# Patient Record
Sex: Male | Born: 1960
Health system: Southern US, Community
[De-identification: ages and names within clinical notes are randomized; demographics above are authoritative.]

## PROBLEM LIST (undated history)

## (undated) DIAGNOSIS — M1990 Unspecified osteoarthritis, unspecified site: Secondary | ICD-10-CM

## (undated) DIAGNOSIS — N189 Chronic kidney disease, unspecified: Secondary | ICD-10-CM

## (undated) DIAGNOSIS — K5792 Diverticulitis of intestine, part unspecified, without perforation or abscess without bleeding: Secondary | ICD-10-CM

## (undated) DIAGNOSIS — E785 Hyperlipidemia, unspecified: Secondary | ICD-10-CM

## (undated) DIAGNOSIS — I10 Essential (primary) hypertension: Secondary | ICD-10-CM

## (undated) DIAGNOSIS — K635 Polyp of colon: Secondary | ICD-10-CM

## (undated) DIAGNOSIS — K219 Gastro-esophageal reflux disease without esophagitis: Secondary | ICD-10-CM

## (undated) DIAGNOSIS — R7303 Prediabetes: Secondary | ICD-10-CM

## (undated) DIAGNOSIS — F419 Anxiety disorder, unspecified: Secondary | ICD-10-CM

## (undated) HISTORY — PX: COLONOSCOPY: SHX174

## (undated) HISTORY — DX: Hyperlipidemia, unspecified: E78.5

## (undated) HISTORY — PX: SHOULDER ARTHROSCOPY WITH ROTATOR CUFF REPAIR: SHX5685

## (undated) HISTORY — PX: COLONOSCOPY W/ BIOPSIES AND POLYPECTOMY: SHX1376

## (undated) HISTORY — DX: Polyp of colon: K63.5

## (undated) HISTORY — DX: Diverticulitis of intestine, part unspecified, without perforation or abscess without bleeding: K57.92

## (undated) HISTORY — PX: HEMORRHOID SURGERY: SHX153

---

## 2002-07-24 ENCOUNTER — Encounter: Payer: Self-pay | Admitting: General Surgery

## 2002-07-24 ENCOUNTER — Inpatient Hospital Stay (HOSPITAL_COMMUNITY): Admission: EM | Admit: 2002-07-24 | Discharge: 2002-07-29 | Payer: Self-pay | Admitting: Emergency Medicine

## 2002-08-11 ENCOUNTER — Emergency Department (HOSPITAL_COMMUNITY): Admission: EM | Admit: 2002-08-11 | Discharge: 2002-08-11 | Payer: Self-pay | Admitting: Emergency Medicine

## 2002-08-11 ENCOUNTER — Encounter: Payer: Self-pay | Admitting: General Surgery

## 2002-08-29 ENCOUNTER — Encounter: Payer: Self-pay | Admitting: General Surgery

## 2002-08-29 ENCOUNTER — Encounter: Admission: RE | Admit: 2002-08-29 | Discharge: 2002-08-29 | Payer: Self-pay | Admitting: General Surgery

## 2002-11-09 HISTORY — PX: CYST EXCISION: SHX5701

## 2006-03-09 ENCOUNTER — Ambulatory Visit: Payer: Self-pay | Admitting: Gastroenterology

## 2006-03-23 ENCOUNTER — Ambulatory Visit: Payer: Self-pay | Admitting: Gastroenterology

## 2011-01-28 ENCOUNTER — Other Ambulatory Visit: Payer: Self-pay | Admitting: Family Medicine

## 2011-01-28 DIAGNOSIS — R17 Unspecified jaundice: Secondary | ICD-10-CM

## 2011-01-28 DIAGNOSIS — M8560 Other cyst of bone, unspecified site: Secondary | ICD-10-CM

## 2011-02-02 ENCOUNTER — Ambulatory Visit (HOSPITAL_COMMUNITY)
Admission: RE | Admit: 2011-02-02 | Discharge: 2011-02-02 | Disposition: A | Discharge status: Home / Self Care | Payer: BC Managed Care – PPO | Source: Ambulatory Visit | Attending: Family Medicine | Admitting: Family Medicine

## 2011-02-02 ENCOUNTER — Other Ambulatory Visit (HOSPITAL_COMMUNITY): Payer: Self-pay

## 2011-02-02 DIAGNOSIS — K7689 Other specified diseases of liver: Secondary | ICD-10-CM | POA: Insufficient documentation

## 2011-02-02 DIAGNOSIS — R160 Hepatomegaly, not elsewhere classified: Secondary | ICD-10-CM | POA: Insufficient documentation

## 2011-02-02 DIAGNOSIS — M67919 Unspecified disorder of synovium and tendon, unspecified shoulder: Secondary | ICD-10-CM | POA: Insufficient documentation

## 2011-02-02 DIAGNOSIS — M8560 Other cyst of bone, unspecified site: Secondary | ICD-10-CM

## 2011-02-02 DIAGNOSIS — M719 Bursopathy, unspecified: Secondary | ICD-10-CM | POA: Insufficient documentation

## 2011-02-02 DIAGNOSIS — R17 Unspecified jaundice: Secondary | ICD-10-CM

## 2011-02-02 DIAGNOSIS — M25519 Pain in unspecified shoulder: Secondary | ICD-10-CM | POA: Insufficient documentation

## 2011-05-25 ENCOUNTER — Encounter: Payer: Self-pay | Admitting: Gastroenterology

## 2012-07-28 ENCOUNTER — Encounter: Payer: Self-pay | Admitting: Gastroenterology

## 2012-10-18 ENCOUNTER — Encounter: Payer: Self-pay | Admitting: Internal Medicine

## 2012-11-22 ENCOUNTER — Ambulatory Visit (AMBULATORY_SURGERY_CENTER): Payer: BC Managed Care – PPO

## 2012-11-22 VITALS — Ht 70.0 in | Wt 268.8 lb

## 2012-11-22 DIAGNOSIS — Z800 Family history of malignant neoplasm of digestive organs: Secondary | ICD-10-CM

## 2012-11-22 DIAGNOSIS — Z1211 Encounter for screening for malignant neoplasm of colon: Secondary | ICD-10-CM

## 2012-11-22 MED ORDER — MOVIPREP 100 G PO SOLR: ORAL | Status: DC

## 2012-11-23 ENCOUNTER — Encounter: Payer: Self-pay | Admitting: Internal Medicine

## 2012-11-24 ENCOUNTER — Encounter: Payer: Self-pay | Admitting: Internal Medicine

## 2012-12-02 ENCOUNTER — Encounter: Payer: Self-pay | Admitting: Internal Medicine

## 2013-01-05 ENCOUNTER — Telehealth: Payer: Self-pay | Admitting: Internal Medicine

## 2013-01-05 NOTE — Telephone Encounter (Signed)
Called patient and he stated the prep wasn't available to be picked up.  He was not aware that he needed to pick up the prep within 7 days.  I advised him that the prescription should still be in the computer, that they just place the medication back after 7 days.  They will need to fill it again.  Advised him to call if there is any problems.  He agreed to do so.

## 2013-01-06 ENCOUNTER — Ambulatory Visit (AMBULATORY_SURGERY_CENTER): Payer: BC Managed Care – PPO | Admitting: Internal Medicine

## 2013-01-06 ENCOUNTER — Encounter: Payer: Self-pay | Admitting: Internal Medicine

## 2013-01-06 VITALS — BP 144/83 | HR 64 | Temp 98.2°F | Resp 14 | Ht 70.0 in | Wt 268.0 lb

## 2013-01-06 DIAGNOSIS — Z1211 Encounter for screening for malignant neoplasm of colon: Secondary | ICD-10-CM

## 2013-01-06 DIAGNOSIS — D126 Benign neoplasm of colon, unspecified: Secondary | ICD-10-CM

## 2013-01-06 DIAGNOSIS — Z800 Family history of malignant neoplasm of digestive organs: Secondary | ICD-10-CM

## 2013-01-06 MED ORDER — SODIUM CHLORIDE 0.9 % IV SOLN: 500.0000 mL | INTRAVENOUS | Status: DC

## 2013-01-06 NOTE — Progress Notes (Signed)
Patient did not have preoperative order for IV antibiotic SSI prophylaxis. (G8918)   

## 2013-01-06 NOTE — Progress Notes (Signed)

## 2013-01-06 NOTE — Op Note (Signed)
[facility] [address] [place], [address]   COLONOSCOPY PROCEDURE REPORT  PATIENT: Francisco Allen, Francisco Allen  MR#: [id number] BIRTHDATE: 04-26-1961 , 51  yrs. old GENDER: Male ENDOSCOPIST: Roxy Cedar, MD REFERRED NU:[id number] Program Recall PROCEDURE DATE:  01/06/2013 PROCEDURE:   Colonoscopy with snare polypectomy    x 1 ASA CLASS:   Class II INDICATIONS:Patient's immediate family history of colon cancer. Index exam 03-2006 with hyperplastic polyp. MEDICATIONS: MAC sedation, administered by CRNA and propofol (Diprivan) 300mg  IV  DESCRIPTION OF PROCEDURE:   After the risks benefits and alternatives of the procedure were thoroughly explained, informed consent was obtained.  A digital rectal exam revealed no abnormalities of the rectum.   The LB [id number]  endoscope was introduced through the anus and advanced to the cecum, which was identified by both the appendix and ileocecal valve. No adverse events experienced.   The quality of the prep was good, using MoviPrep  The instrument was then slowly withdrawn as the colon was fully examined.      COLON FINDINGS: A diminutive polyp was found in the transverse colon.  A polypectomy was performed with a cold snare.  The resection was complete and the polyp tissue was completely retrieved.   Moderate diverticulosis was noted The finding was in the left colon.   The colon mucosa was otherwise normal. Retroflexed views revealed internal hemorrhoids. The time to cecum=3 minutes 07 seconds.  Withdrawal time=10 minutes 26 seconds. The scope was withdrawn and the procedure completed. COMPLICATIONS: There were no complications.  ENDOSCOPIC IMPRESSION: 1.   Diminutive polyp was found in the transverse colon; polypectomy was performed with a cold snare 2.   Moderate diverticulosis was noted in the left colon 3.   The colon mucosa was otherwise normal  RECOMMENDATIONS: 1. Follow up colonoscopy in 5  years   eSigned:  Roxy Cedar, MD 01/06/2013 11:45 AM   cc: Rudi Heap, MD and The Patient   PATIENT NAME:  Francisco Allen, Francisco Allen MR#: [id number]

## 2013-01-06 NOTE — Progress Notes (Signed)
Called to room to assist during endoscopic procedure.  Patient ID and intended procedure confirmed with present staff. Received instructions for my participation in the procedure from the performing physician. ewm 

## 2013-01-06 NOTE — Patient Instructions (Addendum)
YOU HAD AN ENDOSCOPIC PROCEDURE TODAY AT THE [facility]: Refer to the procedure report that was given to you for any specific questions about what was found during the examination.  If the procedure report does not answer your questions, please call your gastroenterologist to clarify.  If you requested that your care partner not be given the details of your procedure findings, then the procedure report has been included in a sealed envelope for you to review at your convenience later.  YOU SHOULD EXPECT: Some feelings of bloating in the abdomen. Passage of more gas than usual.  Walking can help get rid of the air that was put into your GI tract during the procedure and reduce the bloating. If you had a lower endoscopy (such as a colonoscopy or flexible sigmoidoscopy) you may notice spotting of blood in your stool or on the toilet paper. If you underwent a bowel prep for your procedure, then you may not have a normal bowel movement for a few days.  DIET: Your first meal following the procedure should be a light meal and then it is ok to progress to your normal diet.  A half-sandwich or bowl of soup is an example of a good first meal.  Heavy or fried foods are harder to digest and may make you feel nauseous or bloated.  Likewise meals heavy in dairy and vegetables can cause extra gas to form and this can also increase the bloating.  Drink plenty of fluids but you should avoid alcoholic beverages for 24 hours.  Try to increase your fiber intake to prevent Diverticulitis.  ACTIVITY: Your care partner should take you home directly after the procedure.  You should plan to take it easy, moving slowly for the rest of the day.  You can resume normal activity the day after the procedure however you should NOT DRIVE or use heavy machinery for 24 hours (because of the sedation medicines used during the test).    SYMPTOMS TO REPORT IMMEDIATELY: A gastroenterologist can be reached at any hour.  During  normal business hours, 8:30 AM to 5:00 PM Monday through Friday, call [phone].  After hours and on weekends, please call the GI answering service at [phone] who will take a message and have the physician on call contact you.   Following lower endoscopy (colonoscopy or flexible sigmoidoscopy):  Excessive amounts of blood in the stool  Significant tenderness or worsening of abdominal pains  Swelling of the abdomen that is new, acute  Fever of 100F or higher  FOLLOW UP: If any biopsies were taken you will be contacted by phone or by letter within the next 1-3 weeks.  Call your gastroenterologist if you have not heard about the biopsies in 3 weeks.  Our staff will call the home number listed on your records the next business day following your procedure to check on you and address any questions or concerns that you may have at that time regarding the information given to you following your procedure. This is a courtesy call and so if there is no answer at the home number and we have not heard from you through the emergency physician on call, we will assume that you have returned to your regular daily activities without incident.  SIGNATURES/CONFIDENTIALITY: You and/or your care partner have signed paperwork which will be entered into your electronic medical record.  These signatures attest to the fact that that the information above on your After Visit Summary has been reviewed and  is understood.  Full responsibility of the confidentiality of this discharge information lies with you and/or your care-partner.

## 2013-01-09 ENCOUNTER — Telehealth: Payer: Self-pay

## 2013-01-09 NOTE — Telephone Encounter (Signed)
The pt's voice mail box has not been set up on the recording at [phone] no message left. Maw

## 2013-01-10 ENCOUNTER — Encounter: Payer: Self-pay | Admitting: Internal Medicine

## 2013-03-09 ENCOUNTER — Other Ambulatory Visit: Payer: Self-pay | Admitting: *Deleted

## 2013-03-09 MED ORDER — ALPRAZOLAM 1 MG PO TABS: ORAL_TABLET | ORAL | Status: DC

## 2013-03-09 NOTE — Telephone Encounter (Signed)
LAST REFILL 11/14/12. CALL IN [facility] MAYODAN. Marland Kitchen LAST OV 11/13 WITH ACM

## 2013-03-16 NOTE — Telephone Encounter (Signed)
Phoned in and left on [facility] voicemail.

## 2013-04-04 ENCOUNTER — Ambulatory Visit (INDEPENDENT_AMBULATORY_CARE_PROVIDER_SITE_OTHER): Payer: BC Managed Care – PPO | Admitting: Nurse Practitioner

## 2013-04-04 ENCOUNTER — Encounter: Payer: Self-pay | Admitting: Nurse Practitioner

## 2013-04-04 VITALS — BP 143/95 | HR 76 | Temp 98.1°F | Ht 70.0 in | Wt 265.0 lb

## 2013-04-04 DIAGNOSIS — Z125 Encounter for screening for malignant neoplasm of prostate: Secondary | ICD-10-CM

## 2013-04-04 DIAGNOSIS — E1169 Type 2 diabetes mellitus with other specified complication: Secondary | ICD-10-CM | POA: Insufficient documentation

## 2013-04-04 DIAGNOSIS — E785 Hyperlipidemia, unspecified: Secondary | ICD-10-CM

## 2013-04-04 DIAGNOSIS — F411 Generalized anxiety disorder: Secondary | ICD-10-CM

## 2013-04-04 LAB — COMPLETE METABOLIC PANEL WITH GFR
AST: 30 U/L (ref 0–37)
Albumin: 4.4 g/dL (ref 3.5–5.2)
Alkaline Phosphatase: 70 U/L (ref 39–117)
BUN: 18 mg/dL (ref 6–23)
Potassium: 4.3 mEq/L (ref 3.5–5.3)
Total Bilirubin: 1.1 mg/dL (ref 0.3–1.2)

## 2013-04-04 MED ORDER — ATORVASTATIN CALCIUM 40 MG PO TABS: 40.0000 mg | ORAL_TABLET | Freq: Every day | ORAL | Status: DC

## 2013-04-04 MED ORDER — OMEGA-3-ACID ETHYL ESTERS 1 G PO CAPS: 1.0000 g | ORAL_CAPSULE | Freq: Two times a day (BID) | ORAL | Status: DC

## 2013-04-04 NOTE — Progress Notes (Signed)
  Subjective:    Patient ID: Francisco Allen, male    DOB: 1961-08-06, 52 y.o.   MRN: [id number]  Hyperlipidemia This is a chronic problem. The current episode started more than 1 year ago. The problem is controlled. Recent lipid tests were reviewed and are normal. Exacerbating diseases include obesity. There are no known factors aggravating his hyperlipidemia. Pertinent negatives include no chest pain, focal sensory loss, leg pain, myalgias or shortness of breath. Current antihyperlipidemic treatment includes statins. The current treatment provides moderate improvement of lipids. Compliance problems include adherence to diet and adherence to exercise.  Risk factors for coronary artery disease include male sex and obesity.  GAD Takes xanax prn- on average 1 a day.    Review of Systems  Constitutional: Negative.   HENT: Negative.   Respiratory: Negative.  Negative for shortness of breath.   Cardiovascular: Negative for chest pain, palpitations and leg swelling.  Endocrine: Negative.   Genitourinary: Negative.   Musculoskeletal: Negative for myalgias.  Allergic/Immunologic: Negative.   Neurological: Negative.   Hematological: Negative.   Psychiatric/Behavioral: Negative.        Objective:   Physical Exam  Constitutional: He is oriented to person, place, and time. He appears well-developed and well-nourished.  HENT:  Head: Normocephalic.  Right Ear: External ear normal.  Left Ear: External ear normal.  Nose: Nose normal.  Mouth/Throat: Oropharynx is clear and moist.  Eyes: EOM are normal. Pupils are equal, round, and reactive to light.  Neck: Normal range of motion. Neck supple. No thyromegaly present.  Cardiovascular: Normal rate, regular rhythm, normal heart sounds and intact distal pulses.   No murmur heard. Pulmonary/Chest: Effort normal and breath sounds normal. He has no wheezes. He has no rales.  Abdominal: Soft. Bowel sounds are normal.  Musculoskeletal: Normal range of  motion.  Neurological: He is alert and oriented to person, place, and time.  Skin: Skin is warm and dry.  Psychiatric: He has a normal mood and affect. His behavior is normal. Judgment and thought content normal.    BP 143/95  Pulse 76  Temp(Src) 98.1 F (36.7 C) (Oral)  Ht 5\' 10"  (1.778 m)  Wt 265 lb (120.203 kg)  BMI 38.02 kg/m2       Assessment & Plan:  1. GAD (generalized anxiety disorder) Stress management  2. Hyperlipidemia Low fat diet and exercise - COMPLETE METABOLIC PANEL WITH GFR - NMR Lipoprofile with Lipids  3. Screening for prostate cancer - PSA  Continue all meds  Mary-Margaret Daphine Deutscher, FNP   Mary-Margaret Daphine Deutscher, FNP

## 2013-04-04 NOTE — Patient Instructions (Addendum)
Health Maintenance, Males A healthy lifestyle and preventative care can promote health and wellness.  Maintain regular health, dental, and eye exams.  Eat a healthy diet. Foods like vegetables, fruits, whole grains, low-fat dairy products, and lean protein foods contain the nutrients you need without too many calories. Decrease your intake of foods high in solid fats, added sugars, and salt. Get information about a proper diet from your caregiver, if necessary.  Regular physical exercise is one of the most important things you can do for your health. Most adults should get at least 150 minutes of moderate-intensity exercise (any activity that increases your heart rate and causes you to sweat) each week. In addition, most adults need muscle-strengthening exercises on 2 or more days a week.   Maintain a healthy weight. The body mass index (BMI) is a screening tool to identify possible weight problems. It provides an estimate of body fat based on height and weight. Your caregiver can help determine your BMI, and can help you achieve or maintain a healthy weight. For adults 20 years and older:  A BMI below 18.5 is considered underweight.  A BMI of 18.5 to 24.9 is normal.  A BMI of 25 to 29.9 is considered overweight.  A BMI of 30 and above is considered obese.  Maintain normal blood lipids and cholesterol by exercising and minimizing your intake of saturated fat. Eat a balanced diet with plenty of fruits and vegetables. Blood tests for lipids and cholesterol should begin at age 20 and be repeated every 5 years. If your lipid or cholesterol levels are high, you are over 50, or you are a high risk for heart disease, you may need your cholesterol levels checked more frequently.Ongoing high lipid and cholesterol levels should be treated with medicines, if diet and exercise are not effective.  If you smoke, find out from your caregiver how to quit. If you do not use tobacco, do not start.  If you  choose to drink alcohol, do not exceed 2 drinks per day. One drink is considered to be 12 ounces (355 mL) of beer, 5 ounces (148 mL) of wine, or 1.5 ounces (44 mL) of liquor.  Avoid use of street drugs. Do not share needles with anyone. Ask for help if you need support or instructions about stopping the use of drugs.  High blood pressure causes heart disease and increases the risk of stroke. Blood pressure should be checked at least every 1 to 2 years. Ongoing high blood pressure should be treated with medicines if weight loss and exercise are not effective.  If you are 45 to 54 years old, ask your caregiver if you should take aspirin to prevent heart disease.  Diabetes screening involves taking a blood sample to check your fasting blood sugar level. This should be done once every 3 years, after age 45, if you are within normal weight and without risk factors for diabetes. Testing should be considered at a younger age or be carried out more frequently if you are overweight and have at least 1 risk factor for diabetes.  Colorectal cancer can be detected and often prevented. Most routine colorectal cancer screening begins at the age of 50 and continues through age 75. However, your caregiver may recommend screening at an earlier age if you have risk factors for colon cancer. On a yearly basis, your caregiver may provide home test kits to check for hidden blood in the stool. Use of a small camera at the end of a tube,   to directly examine the colon (sigmoidoscopy or colonoscopy), can detect the earliest forms of colorectal cancer. Talk to your caregiver about this at age 50, when routine screening begins. Direct examination of the colon should be repeated every 5 to 10 years through age 75, unless early forms of pre-cancerous polyps or small growths are found.  Hepatitis C blood testing is recommended for all people born from 1945 through 1965 and any individual with known risks for hepatitis C.  Healthy  men should no longer receive prostate-specific antigen (PSA) blood tests as part of routine cancer screening. Consult with your caregiver about prostate cancer screening.  Testicular cancer screening is not recommended for adolescents or adult males who have no symptoms. Screening includes self-exam, caregiver exam, and other screening tests. Consult with your caregiver about any symptoms you have or any concerns you have about testicular cancer.  Practice safe sex. Use condoms and avoid high-risk sexual practices to reduce the spread of sexually transmitted infections (STIs).  Use sunscreen with a sun protection factor (SPF) of 30 or greater. Apply sunscreen liberally and repeatedly throughout the day. You should seek shade when your shadow is shorter than you. Protect yourself by wearing long sleeves, pants, a wide-brimmed hat, and sunglasses year round, whenever you are outdoors.  Notify your caregiver of new moles or changes in moles, especially if there is a change in shape or color. Also notify your caregiver if a mole is larger than the size of a pencil eraser.  A one-time screening for abdominal aortic aneurysm (AAA) and surgical repair of large AAAs by sound wave imaging (ultrasonography) is recommended for ages 65 to 75 years who are current or former smokers.  Stay current with your immunizations. Document Released: 04/23/2008 Document Revised: 01/18/2012 Document Reviewed: 03/23/2011 ExitCare Patient Information 2014 [employer].  

## 2013-04-05 LAB — PSA: PSA: 0.59 ng/mL (ref <=4.00)

## 2013-04-06 LAB — NMR LIPOPROFILE WITH LIPIDS
Cholesterol, Total: 244 mg/dL — ABNORMAL HIGH (ref <200)
HDL-C: 29 mg/dL — ABNORMAL LOW (ref >=40)
LDL (calc): 141 mg/dL — ABNORMAL HIGH (ref <100)
LDL Particle Number: 3298 nmol/L — ABNORMAL HIGH (ref <1000)
LP-IR Score: 87 — ABNORMAL HIGH (ref <=45)
Large VLDL-P: 17 nmol/L — ABNORMAL HIGH (ref <=2.7)
Triglycerides: 370 mg/dL — ABNORMAL HIGH (ref <150)
VLDL Size: 55.1 nm — ABNORMAL HIGH (ref <=46.6)

## 2013-04-19 ENCOUNTER — Other Ambulatory Visit: Payer: Self-pay | Admitting: *Deleted

## 2013-04-19 MED ORDER — TRAMADOL HCL 50 MG PO TABS: ORAL_TABLET | ORAL | Status: DC

## 2013-04-19 NOTE — Telephone Encounter (Signed)
LAT RF 10/03/12. LAST OV 04/04/13. PLEASE PRINT RX AND CALL PT WHEN READY IF APPROVED.

## 2013-04-26 ENCOUNTER — Other Ambulatory Visit: Payer: Self-pay | Admitting: Family Medicine

## 2013-04-28 NOTE — Telephone Encounter (Signed)
Please call in xanax rx with 1 refill 

## 2013-04-28 NOTE — Telephone Encounter (Signed)
Last seen 04/04/13, last filled 03/09/13, Have nurse call into [facility]

## 2013-06-23 ENCOUNTER — Other Ambulatory Visit: Payer: Self-pay

## 2013-06-23 MED ORDER — TRAMADOL HCL 50 MG PO TABS: ORAL_TABLET | ORAL | Status: DC

## 2013-06-23 NOTE — Telephone Encounter (Signed)
Up front 

## 2013-06-23 NOTE — Telephone Encounter (Signed)
rx ready for pickup 

## 2013-06-23 NOTE — Telephone Encounter (Signed)
Last seen 04/04/13   Last filled 04/19/13    If approved print and route to nurse

## 2013-07-04 ENCOUNTER — Telehealth: Payer: Self-pay | Admitting: Nurse Practitioner

## 2013-07-04 ENCOUNTER — Other Ambulatory Visit: Payer: Self-pay | Admitting: Nurse Practitioner

## 2013-07-04 DIAGNOSIS — E785 Hyperlipidemia, unspecified: Secondary | ICD-10-CM

## 2013-07-04 NOTE — Telephone Encounter (Signed)
Orders placed.

## 2013-07-05 NOTE — Telephone Encounter (Signed)
Pt aware - med at pharm  

## 2013-07-05 NOTE — Telephone Encounter (Signed)
Last seen 03/29/13  MMM  Last filled 04/26/13    If approved route to nurse to call in

## 2013-07-05 NOTE — Telephone Encounter (Signed)
Please call in xanax rx with 1 refill 

## 2013-07-07 ENCOUNTER — Other Ambulatory Visit (INDEPENDENT_AMBULATORY_CARE_PROVIDER_SITE_OTHER): Payer: BC Managed Care – PPO

## 2013-07-07 DIAGNOSIS — E785 Hyperlipidemia, unspecified: Secondary | ICD-10-CM

## 2013-07-09 LAB — CMP14+EGFR
ALT: 53 IU/L — ABNORMAL HIGH (ref 0–44)
Albumin: 4.6 g/dL (ref 3.5–5.5)
BUN: 13 mg/dL (ref 6–24)
CO2: 24 mmol/L (ref 18–29)
Calcium: 9.8 mg/dL (ref 8.7–10.2)
Chloride: 102 mmol/L (ref 97–108)
Glucose: 97 mg/dL (ref 65–99)
Potassium: 4.4 mmol/L (ref 3.5–5.2)
Total Protein: 6.9 g/dL (ref 6.0–8.5)

## 2013-07-09 LAB — NMR, LIPOPROFILE
Cholesterol: 121 mg/dL (ref <200)
LDL Particle Number: 1527 nmol/L — ABNORMAL HIGH (ref <1000)
LDLC SERPL CALC-MCNC: 59 mg/dL (ref <100)
LP-IR Score: 79 — ABNORMAL HIGH (ref <=45)
Triglycerides by NMR: 159 mg/dL — ABNORMAL HIGH (ref <150)

## 2013-07-12 NOTE — Progress Notes (Signed)
Patient came in for labs only.

## 2013-08-15 ENCOUNTER — Telehealth: Payer: Self-pay | Admitting: Nurse Practitioner

## 2013-08-15 MED ORDER — AZITHROMYCIN 250 MG PO TABS: ORAL_TABLET | ORAL | Status: DC

## 2013-08-15 NOTE — Telephone Encounter (Signed)
zpak sent to pharmacy 

## 2013-08-15 NOTE — Telephone Encounter (Signed)
Patients wife aware

## 2013-09-14 ENCOUNTER — Other Ambulatory Visit: Payer: Self-pay

## 2013-10-09 ENCOUNTER — Encounter: Payer: Self-pay | Admitting: Nurse Practitioner

## 2013-10-09 ENCOUNTER — Ambulatory Visit (INDEPENDENT_AMBULATORY_CARE_PROVIDER_SITE_OTHER): Payer: BC Managed Care – PPO | Admitting: Nurse Practitioner

## 2013-10-09 ENCOUNTER — Other Ambulatory Visit: Payer: BC Managed Care – PPO

## 2013-10-09 ENCOUNTER — Ambulatory Visit: Payer: BC Managed Care – PPO | Admitting: Nurse Practitioner

## 2013-10-09 VITALS — BP 136/82 | HR 67 | Temp 97.3°F | Ht 70.0 in | Wt 269.0 lb

## 2013-10-09 DIAGNOSIS — E785 Hyperlipidemia, unspecified: Secondary | ICD-10-CM

## 2013-10-09 DIAGNOSIS — F411 Generalized anxiety disorder: Secondary | ICD-10-CM

## 2013-10-09 MED ORDER — ALPRAZOLAM 1 MG PO TABS: 1.0000 mg | ORAL_TABLET | Freq: Three times a day (TID) | ORAL | Status: DC | PRN

## 2013-10-09 MED ORDER — ATORVASTATIN CALCIUM 40 MG PO TABS: 40.0000 mg | ORAL_TABLET | Freq: Every day | ORAL | Status: DC

## 2013-10-09 NOTE — Patient Instructions (Signed)
Fat and Cholesterol Control Diet  Fat and cholesterol levels in your blood and organs are influenced by your diet. High levels of fat and cholesterol may lead to diseases of the heart, small and large blood vessels, gallbladder, liver, and pancreas.  CONTROLLING FAT AND CHOLESTEROL WITH DIET  Although exercise and lifestyle factors are important, your diet is key. That is because certain foods are known to raise cholesterol and others to lower it. The goal is to balance foods for their effect on cholesterol and more importantly, to replace saturated and trans fat with other types of fat, such as monounsaturated fat, polyunsaturated fat, and omega-3 fatty acids.  On average, a person should consume no more than 15 to 17 g of saturated fat daily. Saturated and trans fats are considered "bad" fats, and they will raise LDL cholesterol. Saturated fats are primarily found in animal products such as meats, butter, and cream. However, that does not mean you need to give up all your favorite foods. Today, there are good tasting, low-fat, low-cholesterol substitutes for most of the things you like to eat. Choose low-fat or nonfat alternatives. Choose round or loin cuts of red meat. These types of cuts are lowest in fat and cholesterol. Chicken (without the skin), fish, veal, and ground turkey breast are great choices. Eliminate fatty meats, such as hot dogs and salami. Even shellfish have little or no saturated fat. Have a 3 oz (85 g) portion when you eat lean meat, poultry, or fish.  Trans fats are also called "partially hydrogenated oils." They are oils that have been scientifically manipulated so that they are solid at room temperature resulting in a longer shelf life and improved taste and texture of foods in which they are added. Trans fats are found in stick margarine, some tub margarines, cookies, crackers, and baked goods.   When baking and cooking, oils are a great substitute for butter. The monounsaturated oils are  especially beneficial since it is believed they lower LDL and raise HDL. The oils you should avoid entirely are saturated tropical oils, such as coconut and palm.   Remember to eat a lot from food groups that are naturally free of saturated and trans fat, including fish, fruit, vegetables, beans, grains (barley, rice, couscous, bulgur wheat), and pasta (without cream sauces).   IDENTIFYING FOODS THAT LOWER FAT AND CHOLESTEROL   Soluble fiber may lower your cholesterol. This type of fiber is found in fruits such as apples, vegetables such as broccoli, potatoes, and carrots, legumes such as beans, peas, and lentils, and grains such as barley. Foods fortified with plant sterols (phytosterol) may also lower cholesterol. You should eat at least 2 g per day of these foods for a cholesterol lowering effect.   Read package labels to identify low-saturated fats, trans fat free, and low-fat foods at the supermarket. Select cheeses that have only 2 to 3 g saturated fat per ounce. Use a heart-healthy tub margarine that is free of trans fats or partially hydrogenated oil. When buying baked goods (cookies, crackers), avoid partially hydrogenated oils. Breads and muffins should be made from whole grains (whole-wheat or whole oat flour, instead of "flour" or "enriched flour"). Buy non-creamy canned soups with reduced salt and no added fats.   FOOD PREPARATION TECHNIQUES   Never deep-fry. If you must fry, either stir-fry, which uses very little fat, or use non-stick cooking sprays. When possible, broil, bake, or roast meats, and steam vegetables. Instead of putting butter or margarine on vegetables, use lemon   and herbs, applesauce, and cinnamon (for squash and sweet potatoes). Use nonfat yogurt, salsa, and low-fat dressings for salads.   LOW-SATURATED FAT / LOW-FAT FOOD SUBSTITUTES  Meats / Saturated Fat (g)  · Avoid: Steak, marbled (3 oz/85 g) / 11 g  · Choose: Steak, lean (3 oz/85 g) / 4 g  · Avoid: Hamburger (3 oz/85 g) / 7  g  · Choose: Hamburger, lean (3 oz/85 g) / 5 g  · Avoid: Ham (3 oz/85 g) / 6 g  · Choose: Ham, lean cut (3 oz/85 g) / 2.4 g  · Avoid: Chicken, with skin, dark meat (3 oz/85 g) / 4 g  · Choose: Chicken, skin removed, dark meat (3 oz/85 g) / 2 g  · Avoid: Chicken, with skin, light meat (3 oz/85 g) / 2.5 g  · Choose: Chicken, skin removed, light meat (3 oz/85 g) / 1 g  Dairy / Saturated Fat (g)  · Avoid: Whole milk (1 cup) / 5 g  · Choose: Low-fat milk, 2% (1 cup) / 3 g  · Choose: Low-fat milk, 1% (1 cup) / 1.5 g  · Choose: Skim milk (1 cup) / 0.3 g  · Avoid: Hard cheese (1 oz/28 g) / 6 g  · Choose: Skim milk cheese (1 oz/28 g) / 2 to 3 g  · Avoid: Cottage cheese, 4% fat (1 cup) / 6.5 g  · Choose: Low-fat cottage cheese, 1% fat (1 cup) / 1.5 g  · Avoid: Ice cream (1 cup) / 9 g  · Choose: Sherbet (1 cup) / 2.5 g  · Choose: Nonfat frozen yogurt (1 cup) / 0.3 g  · Choose: Frozen fruit bar / trace  · Avoid: Whipped cream (1 tbs) / 3.5 g  · Choose: Nondairy whipped topping (1 tbs) / 1 g  Condiments / Saturated Fat (g)  · Avoid: Mayonnaise (1 tbs) / 2 g  · Choose: Low-fat mayonnaise (1 tbs) / 1 g  · Avoid: Butter (1 tbs) / 7 g  · Choose: Extra light margarine (1 tbs) / 1 g  · Avoid: Coconut oil (1 tbs) / 11.8 g  · Choose: Olive oil (1 tbs) / 1.8 g  · Choose: Corn oil (1 tbs) / 1.7 g  · Choose: Safflower oil (1 tbs) / 1.2 g  · Choose: Sunflower oil (1 tbs) / 1.4 g  · Choose: Soybean oil (1 tbs) / 2.4 g  · Choose: Canola oil (1 tbs) / 1 g  Document Released: 10/26/2005 Document Revised: 02/20/2013 Document Reviewed: 04/16/2011  ExitCare® Patient Information ©2014 [employer].

## 2013-10-09 NOTE — Progress Notes (Signed)
  Subjective:    Patient ID: Francisco Allen, male    DOB: 10/28/1961, 52 y.o.   MRN: [id number]  Hyperlipidemia This is a chronic problem. The current episode started more than 1 year ago. The problem is controlled. Recent lipid tests were reviewed and are normal. Exacerbating diseases include obesity. There are no known factors aggravating his hyperlipidemia. Pertinent negatives include no chest pain, focal sensory loss, leg pain, myalgias or shortness of breath. Current antihyperlipidemic treatment includes statins. The current treatment provides moderate improvement of lipids. Compliance problems include adherence to diet and adherence to exercise.  Risk factors for coronary artery disease include male sex and obesity.  GAD Takes xanax prn- on average 1 a day.    Review of Systems  Constitutional: Negative.   HENT: Negative.   Respiratory: Negative.  Negative for shortness of breath.   Cardiovascular: Negative for chest pain, palpitations and leg swelling.  Endocrine: Negative.   Genitourinary: Negative.   Musculoskeletal: Negative for myalgias.  Allergic/Immunologic: Negative.   Neurological: Negative.   Hematological: Negative.   Psychiatric/Behavioral: Negative.        Objective:   Physical Exam  Constitutional: He is oriented to person, place, and time. He appears well-developed and well-nourished.  HENT:  Head: Normocephalic.  Right Ear: External ear normal.  Left Ear: External ear normal.  Nose: Nose normal.  Mouth/Throat: Oropharynx is clear and moist.  Eyes: EOM are normal. Pupils are equal, round, and reactive to light.  Neck: Normal range of motion. Neck supple. No thyromegaly present.  Cardiovascular: Normal rate, regular rhythm, normal heart sounds and intact distal pulses.   No murmur heard. Pulmonary/Chest: Effort normal and breath sounds normal. He has no wheezes. He has no rales.  Abdominal: Soft. Bowel sounds are normal.  Musculoskeletal: Normal range of  motion.  Neurological: He is alert and oriented to person, place, and time.  Skin: Skin is warm and dry.  Psychiatric: He has a normal mood and affect. His behavior is normal. Judgment and thought content normal.    BP 136/82  Pulse 67  Temp(Src) 97.3 F (36.3 C) (Oral)  Ht 5\' 10"  (1.778 m)  Wt 269 lb (122.018 kg)  BMI 38.60 kg/m2        Assessment & Plan:   1. Hyperlipidemia   2. GAD (generalized anxiety disorder)    Orders Placed This Encounter  Procedures  . CMP14+EGFR  . NMR, lipoprofile   Meds ordered this encounter  Medications  . ALPRAZolam (XANAX) 1 MG tablet    Sig: Take 1 tablet (1 mg total) by mouth 3 (three) times daily as needed for anxiety.    Dispense:  90 tablet    Refill:  1    Order Specific Question:  Supervising Provider    Answer:  Ernestina Penna [[id number]]  . atorvastatin (LIPITOR) 40 MG tablet    Sig: Take 1 tablet (40 mg total) by mouth daily.    Dispense:  30 tablet    Refill:  5    Order Specific Question:  Supervising Provider    Answer:  Deborra Medina    Continue all meds Labs pending Diet and exercise encouraged Health maintenance reviewed Follow up in 3 months  Mary-Margaret Daphine Deutscher, FNP

## 2013-10-11 LAB — CMP14+EGFR
AST: 39 IU/L (ref 0–40)
Albumin/Globulin Ratio: 2 (ref 1.1–2.5)
Albumin: 4.6 g/dL (ref 3.5–5.5)
BUN/Creatinine Ratio: 15 (ref 9–20)
BUN: 14 mg/dL (ref 6–24)
CO2: 25 mmol/L (ref 18–29)
Calcium: 9.8 mg/dL (ref 8.7–10.2)
Creatinine, Ser: 0.93 mg/dL (ref 0.76–1.27)
GFR calc non Af Amer: 93 mL/min/{1.73_m2} (ref >59)
Globulin, Total: 2.3 g/dL (ref 1.5–4.5)
Sodium: 140 mmol/L (ref 134–144)

## 2013-10-11 LAB — NMR, LIPOPROFILE
HDL Cholesterol by NMR: 31 mg/dL — ABNORMAL LOW (ref >=40)
LDL Particle Number: 1719 nmol/L — ABNORMAL HIGH (ref <1000)
LDLC SERPL CALC-MCNC: 76 mg/dL (ref <100)
LP-IR Score: 83 — ABNORMAL HIGH (ref <=45)
Triglycerides by NMR: 172 mg/dL — ABNORMAL HIGH (ref <150)

## 2013-11-01 ENCOUNTER — Other Ambulatory Visit: Payer: Self-pay | Admitting: Nurse Practitioner

## 2013-11-01 MED ORDER — TRAMADOL HCL 50 MG PO TABS: ORAL_TABLET | ORAL | Status: DC

## 2013-12-07 ENCOUNTER — Ambulatory Visit (INDEPENDENT_AMBULATORY_CARE_PROVIDER_SITE_OTHER): Payer: BC Managed Care – PPO | Admitting: Nurse Practitioner

## 2013-12-07 ENCOUNTER — Encounter: Payer: Self-pay | Admitting: Nurse Practitioner

## 2013-12-07 VITALS — BP 132/80 | HR 71 | Temp 98.1°F | Ht 70.0 in | Wt 276.0 lb

## 2013-12-07 DIAGNOSIS — J069 Acute upper respiratory infection, unspecified: Secondary | ICD-10-CM

## 2013-12-07 DIAGNOSIS — IMO0001 Reserved for inherently not codable concepts without codable children: Secondary | ICD-10-CM

## 2013-12-07 DIAGNOSIS — R05 Cough: Secondary | ICD-10-CM

## 2013-12-07 DIAGNOSIS — R51 Headache: Secondary | ICD-10-CM

## 2013-12-07 DIAGNOSIS — R059 Cough, unspecified: Secondary | ICD-10-CM

## 2013-12-07 LAB — POCT INFLUENZA A/B
INFLUENZA A, POC: NEGATIVE
Influenza B, POC: NEGATIVE

## 2013-12-07 MED ORDER — KETOROLAC TROMETHAMINE 60 MG/2ML IM SOLN: 60.0000 mg | Freq: Once | INTRAMUSCULAR | Status: DC

## 2013-12-07 NOTE — Patient Instructions (Signed)

## 2013-12-07 NOTE — Progress Notes (Signed)
   Subjective:    Patient ID: Francisco Allen, male    DOB: 08/10/1961, 53 y.o.   MRN: [id number]  HPI  Patient achy all over and cough- no fever. No vomiting or diarrhea    Review of Systems  Constitutional: Positive for chills and fatigue. Negative for fever.  HENT: Positive for congestion, rhinorrhea and sinus pressure. Negative for trouble swallowing.   Respiratory: Positive for cough.   Cardiovascular: Negative.   Gastrointestinal: Negative.   Musculoskeletal: Negative.   Neurological: Positive for headaches.  All other systems reviewed and are negative.       Objective:   Physical Exam  Constitutional: He is oriented to person, place, and time. He appears well-developed and well-nourished.  HENT:  Right Ear: Hearing, tympanic membrane, external ear and ear canal normal.  Left Ear: Hearing, tympanic membrane, external ear and ear canal normal.  Nose: Mucosal edema and rhinorrhea present. Right sinus exhibits no maxillary sinus tenderness and no frontal sinus tenderness. Left sinus exhibits no maxillary sinus tenderness and no frontal sinus tenderness.  Mouth/Throat: Uvula is midline, oropharynx is clear and moist and mucous membranes are normal.  Eyes: EOM are normal. Pupils are equal, round, and reactive to light.  Neck: Normal range of motion. Neck supple.  Cardiovascular: Normal rate, regular rhythm and normal heart sounds.   Pulmonary/Chest: Effort normal and breath sounds normal.  Abdominal: Soft. Bowel sounds are normal.  Lymphadenopathy:    He has no cervical adenopathy.  Neurological: He is alert and oriented to person, place, and time.  Skin: Skin is warm and dry.  Psychiatric: He has a normal mood and affect. His behavior is normal. Judgment and thought content normal.    BP 132/80  Pulse 71  Temp(Src) 98.1 F (36.7 C) (Oral)  Ht 5\' 10"  (1.778 m)  Wt 276 lb (125.193 kg)  BMI 39.60 kg/m2        Assessment & Plan:   1. Headache(784.0)   2. Viral URI     Meds ordered this encounter  Medications  . ketorolac (TORADOL) injection 60 mg    Sig:     1. Take meds as prescribed 2. Use a cool mist humidifier especially during the winter months and when heat has been humid. 3. Use saline nose sprays frequently 4. Saline irrigations of the nose can be very helpful if done frequently.  * 4X daily for 1 week*  * Use of a nettie pot can be helpful with this. Follow directions with this* 5. Drink plenty of fluids 6. Keep thermostat turn down low 7.For any cough or congestion  Use plain Mucinex- regular strength or max strength is fine   * Children- consult with Pharmacist for dosing 8. For fever or aces or pains- take tylenol or ibuprofen appropriate for age and weight.  * for fevers greater than 101 orally you may alternate ibuprofen and tylenol every  3 hours.   Mary-Margaret Hassell Done, FNP

## 2014-02-15 ENCOUNTER — Encounter: Payer: Self-pay | Admitting: Family Medicine

## 2014-02-15 ENCOUNTER — Ambulatory Visit (INDEPENDENT_AMBULATORY_CARE_PROVIDER_SITE_OTHER): Payer: BC Managed Care – PPO | Admitting: Family Medicine

## 2014-02-15 VITALS — BP 132/88 | HR 69 | Temp 96.7°F | Ht 70.0 in | Wt 275.2 lb

## 2014-02-15 DIAGNOSIS — K5732 Diverticulitis of large intestine without perforation or abscess without bleeding: Secondary | ICD-10-CM

## 2014-02-15 MED ORDER — METRONIDAZOLE 500 MG PO TABS: 500.0000 mg | ORAL_TABLET | Freq: Three times a day (TID) | ORAL | Status: DC

## 2014-02-15 MED ORDER — HYDROCODONE-ACETAMINOPHEN 5-325 MG PO TABS: 1.0000 | ORAL_TABLET | Freq: Four times a day (QID) | ORAL | Status: DC | PRN

## 2014-02-15 MED ORDER — CIPROFLOXACIN HCL 500 MG PO TABS: 500.0000 mg | ORAL_TABLET | Freq: Two times a day (BID) | ORAL | Status: DC

## 2014-02-15 NOTE — Progress Notes (Signed)
   Subjective:    Patient ID: Francisco Allen, male    DOB: 27-Apr-1961, 53 y.o.   MRN: [id number]  HPI  This 53 y.o. male presents for evaluation of abdominal pain and fever.  He has hx of diverticulosis. He has been having moderate abdominal pain.  Review of Systems C/o abdominal pain and fever. No chest pain, SOB, HA, dizziness, vision change, N/V, diarrhea, constipation, dysuria, urinary urgency or frequency, myalgias, arthralgias or rash.     Objective:   Physical Exam Vital signs noted  Well developed well nourished male.  HEENT - Head atraumatic Normocephalic                Eyes - PERRLA, Conjuctiva - clear Sclera- Clear EOMI                Ears - EAC's Wnl TM's Wnl Gross Hearing WNL                Nose - Nares patent                 Throat - oropharanx wnl Respiratory - Lungs CTA bilateral Cardiac - RRR S1 and S2 without murmur GI - Abdomen tender LLQ w/o guarding and bowel sounds active x 4     Assessment & Plan:  Diverticulitis of colon (without mention of hemorrhage) - Plan: HYDROcodone-acetaminophen (NORCO) 5-325 MG per tablet, ciprofloxacin (CIPRO) 500 MG tablet, metroNIDAZOLE (FLAGYL) 500 MG tablet  Lysbeth Penner FNP

## 2014-03-07 ENCOUNTER — Other Ambulatory Visit: Payer: Self-pay | Admitting: Nurse Practitioner

## 2014-03-08 NOTE — Telephone Encounter (Signed)
Please call in xanax with 1 refills 

## 2014-03-16 ENCOUNTER — Encounter: Payer: Self-pay | Admitting: Family Medicine

## 2014-03-16 ENCOUNTER — Ambulatory Visit (INDEPENDENT_AMBULATORY_CARE_PROVIDER_SITE_OTHER): Payer: BC Managed Care – PPO | Admitting: Family Medicine

## 2014-03-16 VITALS — BP 132/85 | HR 65 | Temp 98.5°F | Ht 70.0 in | Wt 274.0 lb

## 2014-03-16 DIAGNOSIS — G43909 Migraine, unspecified, not intractable, without status migrainosus: Secondary | ICD-10-CM

## 2014-03-16 MED ORDER — BUTALBITAL-APAP-CAFFEINE 50-325-40 MG PO TABS: 1.0000 | ORAL_TABLET | Freq: Four times a day (QID) | ORAL | Status: DC | PRN

## 2014-03-16 MED ORDER — KETOROLAC TROMETHAMINE 30 MG/ML IJ SOLN
30.0000 mg | Freq: Once | INTRAMUSCULAR | Status: AC
  Administered 2014-03-16: 30 mg via INTRAMUSCULAR

## 2014-03-16 MED ORDER — METHYLPREDNISOLONE (PAK) 4 MG PO TABS: ORAL_TABLET | ORAL | Status: DC

## 2014-03-16 NOTE — Progress Notes (Signed)
   Subjective:    Patient ID: RYOTT RAFFERTY, male    DOB: 1961-05-31, 53 y.o.   MRN: [id number]  HPI   This 53 y.o. male presents for evaluation of headache and phonophotobia. Review of Systems No chest pain, SOB, HA, dizziness, vision change, N/V, diarrhea, constipation, dysuria, urinary urgency or frequency, myalgias, arthralgias or rash.     Objective:   Physical Exam Vital signs noted  Well developed well nourished male.  HEENT - Head atraumatic Normocephalic                Eyes - PERRLA, Conjuctiva - clear Sclera- Clear EOMI                Ears - EAC's Wnl TM's Wnl Gross Hearing WNL                Throat - oropharanx wnl Respiratory - Lungs CTA bilateral Cardiac - RRR S1 and S2 without murmur GI - Abdomen soft Nontender and bowel sounds active x 4 Neuro - Grossly intact.       Assessment & Plan:  Migraine - Plan: ketorolac (TORADOL) 30 MG/ML injection 30 mg, methylPREDNIsolone (MEDROL DOSPACK) 4 MG tablet, butalbital-acetaminophen-caffeine (FIORICET) 50-325-40 MG per tablet  Follow up if sx's persist  Lysbeth Penner FNP

## 2014-04-09 ENCOUNTER — Encounter: Payer: Self-pay | Admitting: Nurse Practitioner

## 2014-04-09 ENCOUNTER — Ambulatory Visit (INDEPENDENT_AMBULATORY_CARE_PROVIDER_SITE_OTHER): Payer: BC Managed Care – PPO | Admitting: Nurse Practitioner

## 2014-04-09 VITALS — BP 138/90 | HR 89 | Temp 98.2°F | Ht 70.0 in | Wt 274.0 lb

## 2014-04-09 DIAGNOSIS — G43909 Migraine, unspecified, not intractable, without status migrainosus: Secondary | ICD-10-CM | POA: Insufficient documentation

## 2014-04-09 DIAGNOSIS — F411 Generalized anxiety disorder: Secondary | ICD-10-CM

## 2014-04-09 DIAGNOSIS — E785 Hyperlipidemia, unspecified: Secondary | ICD-10-CM

## 2014-04-09 MED ORDER — ALPRAZOLAM 1 MG PO TABS: ORAL_TABLET | ORAL | Status: DC

## 2014-04-09 MED ORDER — OMEGA-3-ACID ETHYL ESTERS 1 G PO CAPS: 1.0000 g | ORAL_CAPSULE | Freq: Two times a day (BID) | ORAL | Status: DC

## 2014-04-09 MED ORDER — ATORVASTATIN CALCIUM 40 MG PO TABS: 40.0000 mg | ORAL_TABLET | Freq: Every day | ORAL | Status: DC

## 2014-04-09 MED ORDER — TRAMADOL HCL 50 MG PO TABS: ORAL_TABLET | ORAL | Status: DC

## 2014-04-09 NOTE — Progress Notes (Signed)
Subjective:    Patient ID: Francisco Allen, male    DOB: 1961/11/07, 53 y.o.   MRN: [id number]  Patient here today for follow up of chronic medical problems.  Hyperlipidemia This is a chronic problem. The current episode started more than 1 year ago. The problem is controlled. Recent lipid tests were reviewed and are normal. Exacerbating diseases include obesity. There are no known factors aggravating his hyperlipidemia. Pertinent negatives include no chest pain, focal sensory loss, leg pain, myalgias or shortness of breath. Current antihyperlipidemic treatment includes statins. The current treatment provides moderate improvement of lipids. Compliance problems include adherence to diet and adherence to exercise.  Risk factors for coronary artery disease include male sex and obesity.  GAD Takes xanax prn- on average 1 a day. Migraines  Patient has not has a headache in over 3 weeks- Usually takes fioricet, and ultram  *multiple tick bite on back   Review of Systems  Constitutional: Negative.   HENT: Negative.   Respiratory: Negative.  Negative for shortness of breath.   Cardiovascular: Negative for chest pain, palpitations and leg swelling.  Endocrine: Negative.   Genitourinary: Negative.   Musculoskeletal: Negative for myalgias.  Allergic/Immunologic: Negative.   Neurological: Negative.   Hematological: Negative.   Psychiatric/Behavioral: Negative.        Objective:   Physical Exam  Constitutional: He is oriented to person, place, and time. He appears well-developed and well-nourished.  HENT:  Head: Normocephalic.  Right Ear: External ear normal.  Left Ear: External ear normal.  Nose: Nose normal.  Mouth/Throat: Oropharynx is clear and moist.  Eyes: EOM are normal. Pupils are equal, round, and reactive to light.  Neck: Normal range of motion. Neck supple. No thyromegaly present.  Cardiovascular: Normal rate, regular rhythm, normal heart sounds and intact distal pulses.   No  murmur heard. Pulmonary/Chest: Effort normal and breath sounds normal. He has no wheezes. He has no rales.  Abdominal: Soft. Bowel sounds are normal.  Musculoskeletal: Normal range of motion.  Neurological: He is alert and oriented to person, place, and time.  Skin: Skin is warm and dry.  Psychiatric: He has a normal mood and affect. His behavior is normal. Judgment and thought content normal.    BP 138/90  Pulse 89  Temp(Src) 98.2 F (36.8 C) (Oral)  Ht _0  (1.778 m)  Wt 274 lb (124.286 kg)  BMI 39.32 kg/m2        Assessment & Plan:    1. Migraines   2. Hyperlipidemia   3. GAD (generalized anxiety disorder)    Orders Placed This Encounter  Procedures  . CMP14+EGFR  . NMR, lipoprofile   Meds ordered this encounter  Medications  . atorvastatin (LIPITOR) 40 MG tablet    Sig: Take 1 tablet (40 mg total) by mouth daily.    Dispense:  30 tablet    Refill:  5    Order Specific Question:  Supervising Provider    Answer:  Chipper Herb [[id number]]  . omega-3 acid ethyl esters (LOVAZA) 1 G capsule    Sig: Take 1 capsule (1 g total) by mouth 2 (two) times daily.    Dispense:  60 capsule    Refill:  5    Order Specific Question:  Supervising Provider    Answer:  Chipper Herb [[id number]]  . traMADol (ULTRAM) 50 MG tablet    Sig: TAKE ONE TABLET EVERY 8 TO 12 HOURS AS NEEDED    Dispense:  90 tablet  Refill:  0    Order Specific Question:  Supervising Provider    Answer:  Chipper Herb [[id number]]  . ALPRAZolam (XANAX) 1 MG tablet    Sig: TAKE ONE TABLET BY MOUTH THREE TIMES DAILY AS NEEDED FOR ANXIETY    Dispense:  90 tablet    Refill:  1    Order Specific Question:  Supervising Provider    Answer:  Chipper Herb [[id number]]    Labs pending Health maintenance reviewed Diet and exercise encouraged Continue all meds Follow up  In 3 month   [place], FNP

## 2014-04-09 NOTE — Addendum Note (Signed)
Addended by: Chevis Pretty on: 04/09/2014 04:00 PM   Modules accepted: Orders

## 2014-04-09 NOTE — Patient Instructions (Signed)
Fat and Cholesterol Control Diet  Fat and cholesterol levels in your blood and organs are influenced by your diet. High levels of fat and cholesterol may lead to diseases of the heart, small and large blood vessels, gallbladder, liver, and pancreas.  CONTROLLING FAT AND CHOLESTEROL WITH DIET  Although exercise and lifestyle factors are important, your diet is key. That is because certain foods are known to raise cholesterol and others to lower it. The goal is to balance foods for their effect on cholesterol and more importantly, to replace saturated and trans fat with other types of fat, such as monounsaturated fat, polyunsaturated fat, and omega-3 fatty acids.  On average, a person should consume no more than 15 to 17 g of saturated fat daily. Saturated and trans fats are considered "bad" fats, and they will raise LDL cholesterol. Saturated fats are primarily found in animal products such as meats, butter, and cream. However, that does not mean you need to give up all your favorite foods. Today, there are good tasting, low-fat, low-cholesterol substitutes for most of the things you like to eat. Choose low-fat or nonfat alternatives. Choose round or loin cuts of red meat. These types of cuts are lowest in fat and cholesterol. Chicken (without the skin), fish, veal, and ground turkey breast are great choices. Eliminate fatty meats, such as hot dogs and salami. Even shellfish have little or no saturated fat. Have a 3 oz (85 g) portion when you eat lean meat, poultry, or fish.  Trans fats are also called "partially hydrogenated oils." They are oils that have been scientifically manipulated so that they are solid at room temperature resulting in a longer shelf life and improved taste and texture of foods in which they are added. Trans fats are found in stick margarine, some tub margarines, cookies, crackers, and baked goods.   When baking and cooking, oils are a great substitute for butter. The monounsaturated oils are  especially beneficial since it is believed they lower LDL and raise HDL. The oils you should avoid entirely are saturated tropical oils, such as coconut and palm.   Remember to eat a lot from food groups that are naturally free of saturated and trans fat, including fish, fruit, vegetables, beans, grains (barley, rice, couscous, bulgur wheat), and pasta (without cream sauces).   IDENTIFYING FOODS THAT LOWER FAT AND CHOLESTEROL   Soluble fiber may lower your cholesterol. This type of fiber is found in fruits such as apples, vegetables such as broccoli, potatoes, and carrots, legumes such as beans, peas, and lentils, and grains such as barley. Foods fortified with plant sterols (phytosterol) may also lower cholesterol. You should eat at least 2 g per day of these foods for a cholesterol lowering effect.   Read package labels to identify low-saturated fats, trans fat free, and low-fat foods at the supermarket. Select cheeses that have only 2 to 3 g saturated fat per ounce. Use a heart-healthy tub margarine that is free of trans fats or partially hydrogenated oil. When buying baked goods (cookies, crackers), avoid partially hydrogenated oils. Breads and muffins should be made from whole grains (whole-wheat or whole oat flour, instead of "flour" or "enriched flour"). Buy non-creamy canned soups with reduced salt and no added fats.   FOOD PREPARATION TECHNIQUES   Never deep-fry. If you must fry, either stir-fry, which uses very little fat, or use non-stick cooking sprays. When possible, broil, bake, or roast meats, and steam vegetables. Instead of putting butter or margarine on vegetables, use lemon   and herbs, applesauce, and cinnamon (for squash and sweet potatoes). Use nonfat yogurt, salsa, and low-fat dressings for salads.   LOW-SATURATED FAT / LOW-FAT FOOD SUBSTITUTES  Meats / Saturated Fat (g)  · Avoid: Steak, marbled (3 oz/85 g) / 11 g  · Choose: Steak, lean (3 oz/85 g) / 4 g  · Avoid: Hamburger (3 oz/85 g) / 7  g  · Choose: Hamburger, lean (3 oz/85 g) / 5 g  · Avoid: Ham (3 oz/85 g) / 6 g  · Choose: Ham, lean cut (3 oz/85 g) / 2.4 g  · Avoid: Chicken, with skin, dark meat (3 oz/85 g) / 4 g  · Choose: Chicken, skin removed, dark meat (3 oz/85 g) / 2 g  · Avoid: Chicken, with skin, light meat (3 oz/85 g) / 2.5 g  · Choose: Chicken, skin removed, light meat (3 oz/85 g) / 1 g  Dairy / Saturated Fat (g)  · Avoid: Whole milk (1 cup) / 5 g  · Choose: Low-fat milk, 2% (1 cup) / 3 g  · Choose: Low-fat milk, 1% (1 cup) / 1.5 g  · Choose: Skim milk (1 cup) / 0.3 g  · Avoid: Hard cheese (1 oz/28 g) / 6 g  · Choose: Skim milk cheese (1 oz/28 g) / 2 to 3 g  · Avoid: Cottage cheese, 4% fat (1 cup) / 6.5 g  · Choose: Low-fat cottage cheese, 1% fat (1 cup) / 1.5 g  · Avoid: Ice cream (1 cup) / 9 g  · Choose: Sherbet (1 cup) / 2.5 g  · Choose: Nonfat frozen yogurt (1 cup) / 0.3 g  · Choose: Frozen fruit bar / trace  · Avoid: Whipped cream (1 tbs) / 3.5 g  · Choose: Nondairy whipped topping (1 tbs) / 1 g  Condiments / Saturated Fat (g)  · Avoid: Mayonnaise (1 tbs) / 2 g  · Choose: Low-fat mayonnaise (1 tbs) / 1 g  · Avoid: Butter (1 tbs) / 7 g  · Choose: Extra light margarine (1 tbs) / 1 g  · Avoid: Coconut oil (1 tbs) / 11.8 g  · Choose: Olive oil (1 tbs) / 1.8 g  · Choose: Corn oil (1 tbs) / 1.7 g  · Choose: Safflower oil (1 tbs) / 1.2 g  · Choose: Sunflower oil (1 tbs) / 1.4 g  · Choose: Soybean oil (1 tbs) / 2.4 g  · Choose: Canola oil (1 tbs) / 1 g  Document Released: 10/26/2005 Document Revised: 02/20/2013 Document Reviewed: 04/16/2011  ExitCare® Patient Information ©2014 [employer].

## 2014-04-12 ENCOUNTER — Other Ambulatory Visit (INDEPENDENT_AMBULATORY_CARE_PROVIDER_SITE_OTHER): Payer: BC Managed Care – PPO

## 2014-04-12 DIAGNOSIS — E785 Hyperlipidemia, unspecified: Secondary | ICD-10-CM

## 2014-04-12 NOTE — Progress Notes (Signed)
Pt came in for labs only 

## 2014-04-13 LAB — CMP14+EGFR
ALBUMIN: 4.6 g/dL (ref 3.5–5.5)
ALT: 47 IU/L — ABNORMAL HIGH (ref 0–44)
AST: 40 IU/L (ref 0–40)
Albumin/Globulin Ratio: 2.1 (ref 1.1–2.5)
Alkaline Phosphatase: 78 IU/L (ref 39–117)
BUN/Creatinine Ratio: 14 (ref 9–20)
BUN: 14 mg/dL (ref 6–24)
CALCIUM: 9.7 mg/dL (ref 8.7–10.2)
CO2: 23 mmol/L (ref 18–29)
Chloride: 100 mmol/L (ref 97–108)
Creatinine, Ser: 1.01 mg/dL (ref 0.76–1.27)
GFR calc Af Amer: 96 mL/min/{1.73_m2} (ref >59)
GFR, EST NON AFRICAN AMERICAN: 83 mL/min/{1.73_m2} (ref >59)
GLOBULIN, TOTAL: 2.2 g/dL (ref 1.5–4.5)
GLUCOSE: 88 mg/dL (ref 65–99)
Potassium: 4.7 mmol/L (ref 3.5–5.2)
Sodium: 139 mmol/L (ref 134–144)
TOTAL PROTEIN: 6.8 g/dL (ref 6.0–8.5)
Total Bilirubin: 1.7 mg/dL — ABNORMAL HIGH (ref 0.0–1.2)

## 2014-04-13 LAB — NMR, LIPOPROFILE
CHOLESTEROL: 110 mg/dL (ref 100–199)
HDL CHOLESTEROL BY NMR: 24 mg/dL — AB (ref >39)
HDL PARTICLE NUMBER: 22 umol/L — AB (ref >=30.5)
LDL Particle Number: 986 nmol/L (ref <1000)
LDL Size: 20.1 nm (ref >20.5)
LDLC SERPL CALC-MCNC: 59 mg/dL (ref 0–99)
LP-IR Score: 81 — ABNORMAL HIGH (ref <=45)
Small LDL Particle Number: 644 nmol/L — ABNORMAL HIGH (ref <=527)
TRIGLYCERIDES BY NMR: 133 mg/dL (ref 0–149)

## 2014-04-19 ENCOUNTER — Ambulatory Visit (INDEPENDENT_AMBULATORY_CARE_PROVIDER_SITE_OTHER): Payer: BC Managed Care – PPO | Admitting: General Practice

## 2014-04-19 VITALS — BP 134/85 | HR 70 | Temp 98.3°F | Ht 70.0 in | Wt 273.6 lb

## 2014-04-19 DIAGNOSIS — Z8719 Personal history of other diseases of the digestive system: Secondary | ICD-10-CM

## 2014-04-19 DIAGNOSIS — R109 Unspecified abdominal pain: Secondary | ICD-10-CM

## 2014-04-19 LAB — POCT CBC
Granulocyte percent: 63 %G (ref 37–80)
HEMATOCRIT: 52.1 % (ref 43.5–53.7)
HEMOGLOBIN: 16.8 g/dL (ref 14.1–18.1)
Lymph, poc: 2.4 (ref 0.6–3.4)
MCH, POC: 28.7 pg (ref 27–31.2)
MCHC: 32.3 g/dL (ref 31.8–35.4)
MCV: 88.6 fL (ref 80–97)
MPV: 8 fL (ref 0–99.8)
POC Granulocyte: 5 (ref 2–6.9)
POC LYMPH %: 31 % (ref 10–50)
Platelet Count, POC: 201 10*3/uL (ref 142–424)
RBC: 5.9 M/uL (ref 4.69–6.13)
RDW, POC: 13.1 %
WBC: 7.9 10*3/uL (ref 4.6–10.2)

## 2014-04-19 LAB — POCT UA - MICROSCOPIC ONLY
Casts, Ur, LPF, POC: NEGATIVE
Crystals, Ur, HPF, POC: NEGATIVE
YEAST UA: NEGATIVE

## 2014-04-19 LAB — POCT URINALYSIS DIPSTICK
BILIRUBIN UA: NEGATIVE
Glucose, UA: NEGATIVE
Ketones, UA: NEGATIVE
LEUKOCYTES UA: NEGATIVE
NITRITE UA: NEGATIVE
PH UA: 5
Spec Grav, UA: 1.02
Urobilinogen, UA: NEGATIVE

## 2014-04-19 MED ORDER — METRONIDAZOLE 500 MG PO TABS: 500.0000 mg | ORAL_TABLET | Freq: Two times a day (BID) | ORAL | Status: DC

## 2014-04-19 MED ORDER — CIPROFLOXACIN HCL 500 MG PO TABS: 500.0000 mg | ORAL_TABLET | Freq: Two times a day (BID) | ORAL | Status: DC

## 2014-04-19 NOTE — Progress Notes (Signed)
Subjective:    Patient ID: Francisco Allen, male    DOB: July 05, 1961, 53 y.o.   MRN: [id number]  Abdominal Pain This is a new problem. The current episode started in the past 7 days. The onset quality is gradual. The problem occurs intermittently. The problem has been unchanged. The pain is located in the LLQ. The pain is at a severity of 3/10. The quality of the pain is aching, burning and cramping. The abdominal pain does not radiate. Pertinent negatives include no belching, constipation, diarrhea, fever, frequency, headaches, myalgias or vomiting. The pain is aggravated by eating. The pain is relieved by nothing. He has tried nothing for the symptoms.      Review of Systems  Constitutional: Negative for fever and chills.  Respiratory: Negative for chest tightness and shortness of breath.   Cardiovascular: Negative for chest pain and palpitations.  Gastrointestinal: Positive for abdominal pain. Negative for vomiting, diarrhea and constipation.  Genitourinary: Negative for frequency and difficulty urinating.  Musculoskeletal: Negative for myalgias.  Neurological: Negative for dizziness, weakness and headaches.       Objective:   Physical Exam  Constitutional: He is oriented to person, place, and time. He appears well-developed and well-nourished.  Pulmonary/Chest: Effort normal and breath sounds normal. No respiratory distress. He exhibits no tenderness.  Abdominal: Soft. Bowel sounds are normal. He exhibits no distension. There is tenderness in the left lower quadrant. There is no CVA tenderness.  Neurological: He is alert and oriented to person, place, and time.  Skin: Skin is warm and dry.  Psychiatric: He has a normal mood and affect.     Results for orders placed in visit on 04/19/14  POCT UA - MICROSCOPIC ONLY      Result Value Ref Range   WBC, Ur, HPF, POC 5-8     RBC, urine, microscopic OCC     Bacteria, U Microscopic OCC     Mucus, UA FEW     Epithelial cells, urine  per micros OCC     Crystals, Ur, HPF, POC NEG     Casts, Ur, LPF, POC NEG     Yeast, UA NEG    POCT URINALYSIS DIPSTICK      Result Value Ref Range   Color, UA GOLD     Clarity, UA CLEAR     Glucose, UA NEG     Bilirubin, UA NEG     Ketones, UA NEG     Spec Grav, UA 1.020     Blood, UA TRACE     pH, UA 5.0     Protein, UA 4+     Urobilinogen, UA negative     Nitrite, UA NEG     Leukocytes, UA Negative    POCT CBC      Result Value Ref Range   WBC 7.9  4.6 - 10.2 K/uL   Lymph, poc 2.4  0.6 - 3.4   POC LYMPH PERCENT 31.0  10 - 50 %L   POC Granulocyte 5.0  2 - 6.9   Granulocyte percent 63.0  37 - 80 %G   RBC 5.9  4.69 - 6.13 M/uL   Hemoglobin 16.8  14.1 - 18.1 g/dL   HCT, POC 52.1  43.5 - 53.7 %   MCV 88.6  80 - 97 fL   MCH, POC 28.7  27 - 31.2 pg   MCHC 32.3  31.8 - 35.4 g/dL   RDW, POC 13.1     Platelet Count, POC 201.0  142 -  424 K/uL   MPV 8.0  0 - 99.8 fL         Assessment & Plan:  1. Abdominal pain  - POCT UA - Microscopic Only - POCT urinalysis dipstick - POCT CBC  2. Hx of diverticulitis of colon  - POCT UA - Microscopic Only - POCT urinalysis dipstick - POCT CBC - ciprofloxacin (CIPRO) 500 MG tablet; Take 1 tablet (500 mg total) by mouth 2 (two) times daily.  Dispense: 14 tablet; Refill: 0 - metroNIDAZOLE (FLAGYL) 500 MG tablet; Take 1 tablet (500 mg total) by mouth 2 (two) times daily.  Dispense: 14 tablet; Refill: 0 -educational material provided and discussed on diverticulitis  -RTO or seek emergency medical treatment if symptoms worsen Patient verbalized understanding Erby Pian, FNP-C

## 2014-04-19 NOTE — Patient Instructions (Signed)
Diverticulitis °A diverticulum is a small pouch or sac on the colon. Diverticulosis is the presence of these diverticula on the colon. Diverticulitis is the irritation (inflammation) or infection of diverticula. °CAUSES  °The colon and its diverticula contain bacteria. If food particles block the tiny opening to a diverticulum, the bacteria inside can grow and cause an increase in pressure. This leads to infection and inflammation and is called diverticulitis. °SYMPTOMS  °· Abdominal pain and tenderness. Usually, the pain is located on the left side of your abdomen. However, it could be located elsewhere. °· Fever. °· Bloating. °· Feeling sick to your stomach (nausea). °· Throwing up (vomiting). °· Abnormal stools. °DIAGNOSIS  °Your caregiver will take a history and perform a physical exam. Since many things can cause abdominal pain, other tests may be necessary. Tests may include: °· Blood tests. °· Urine tests. °· X-ray of the abdomen. °· CT scan of the abdomen. °Sometimes, surgery is needed to determine if diverticulitis or other conditions are causing your symptoms. °TREATMENT  °Most of the time, you can be treated without surgery. Treatment includes: °· Resting the bowels by only having liquids for a few days. As you improve, you will need to eat a low-fiber diet. °· Intravenous (IV) fluids if you are losing body fluids (dehydrated). °· Antibiotic medicines that treat infections may be given. °· Pain and nausea medicine, if needed. °· Surgery if the inflamed diverticulum has burst. °HOME CARE INSTRUCTIONS  °· Try a clear liquid diet (broth, tea, or water for as long as directed by your caregiver). You may then gradually begin a low-fiber diet as tolerated.  °A low-fiber diet is a diet with less than 10 grams of fiber. Choose the foods below to reduce fiber in the diet: °· White breads, cereals, rice, and pasta. °· Cooked fruits and vegetables or soft fresh fruits and vegetables without the skin. °· Ground or  well-cooked tender beef, ham, veal, lamb, pork, or poultry. °· Eggs and seafood. °· After your diverticulitis symptoms have improved, your caregiver may put you on a high-fiber diet. A high-fiber diet includes 14 grams of fiber for every 1000 calories consumed. For a standard 2000 calorie diet, you would need 28 grams of fiber. Follow these diet guidelines to help you increase the fiber in your diet. It is important to slowly increase the amount fiber in your diet to avoid gas, constipation, and bloating. °· Choose whole-grain breads, cereals, pasta, and brown rice. °· Choose fresh fruits and vegetables with the skin on. Do not overcook vegetables because the more vegetables are cooked, the more fiber is lost. °· Choose more nuts, seeds, legumes, dried peas, beans, and lentils. °· Look for food products that have greater than 3 grams of fiber per serving on the Nutrition Facts label. °· Take all medicine as directed by your caregiver. °· If your caregiver has given you a follow-up appointment, it is very important that you go. Not going could result in lasting (chronic) or permanent injury, pain, and disability. If there is any problem keeping the appointment, call to reschedule. °SEEK MEDICAL CARE IF:  °· Your pain does not improve. °· You have a hard time advancing your diet beyond clear liquids. °· Your bowel movements do not return to normal. °SEEK IMMEDIATE MEDICAL CARE IF:  °· Your pain becomes worse. °· You have an oral temperature above 102° F (38.9° C), not controlled by medicine. °· You have repeated vomiting. °· You have bloody or black, tarry stools. °·   Symptoms that brought you to your caregiver become worse or are not getting better. °MAKE SURE YOU:  °· Understand these instructions. °· Will watch your condition. °· Will get help right away if you are not doing well or get worse. °Document Released: 08/05/2005 Document Revised: 01/18/2012 Document Reviewed: 12/01/2010 °[employer]® Patient Information  ©2014 [employer]. ° °

## 2014-06-20 ENCOUNTER — Telehealth: Payer: Self-pay | Admitting: Nurse Practitioner

## 2014-06-20 NOTE — Telephone Encounter (Signed)
Left message on personal voicemail that he would need to be seen

## 2014-06-20 NOTE — Telephone Encounter (Signed)
NTBS.

## 2014-06-21 ENCOUNTER — Ambulatory Visit (INDEPENDENT_AMBULATORY_CARE_PROVIDER_SITE_OTHER): Payer: BC Managed Care – PPO | Admitting: Family Medicine

## 2014-06-21 ENCOUNTER — Encounter: Payer: Self-pay | Admitting: Family Medicine

## 2014-06-21 VITALS — BP 160/94 | HR 77 | Temp 99.4°F | Ht 70.0 in | Wt 274.0 lb

## 2014-06-21 DIAGNOSIS — J0100 Acute maxillary sinusitis, unspecified: Secondary | ICD-10-CM

## 2014-06-21 MED ORDER — LEVOFLOXACIN 500 MG PO TABS: 500.0000 mg | ORAL_TABLET | Freq: Every day | ORAL | Status: DC

## 2014-06-21 MED ORDER — METHYLPREDNISOLONE (PAK) 4 MG PO TABS: ORAL_TABLET | ORAL | Status: DC

## 2014-06-21 NOTE — Progress Notes (Signed)
   Subjective:    Patient ID: Francisco Allen, male    DOB: June 30, 1961, 53 y.o.   MRN: [id number]  HPI  This 53 y.o. male presents for evaluation of URI sx's.  He c/o facial discomfort and mucopurulent nasal discharge.  Review of Systems C/o uri sx's   No chest pain, SOB, HA, dizziness, vision change, N/V, diarrhea, constipation, dysuria, urinary urgency or frequency, myalgias, arthralgias or rash.  Objective:   Physical Exam  Vital signs noted  Well developed well nourished male.  HEENT - Head atraumatic Normocephalic                Eyes - PERRLA, Conjuctiva - clear Sclera- Clear EOMI                Ears - EAC's Wnl TM's Wnl Gross Hearing WNL                Throat - oropharanx wnl Respiratory - Lungs CTA bilateral Cardiac - RRR S1 and S2 without murmur GI - Abdomen soft Nontender and bowel sounds active x 4 Extremities - No edema. Neuro - Grossly intact.      Assessment & Plan:  Acute maxillary sinusitis, recurrence not specified - Plan: methylPREDNIsolone (MEDROL DOSPACK) 4 MG tablet, levofloxacin (LEVAQUIN) 500 MG tablet po qd x 14 days Push po fluids, rest, tylenol and motrin otc prn as directed for fever, arthralgias, and myalgias.  Follow up prn if sx's continue or persist.  Lysbeth Penner FNP

## 2014-07-12 ENCOUNTER — Ambulatory Visit: Payer: BC Managed Care – PPO | Admitting: Nurse Practitioner

## 2014-07-24 ENCOUNTER — Ambulatory Visit (INDEPENDENT_AMBULATORY_CARE_PROVIDER_SITE_OTHER): Payer: BC Managed Care – PPO | Admitting: Family Medicine

## 2014-07-24 ENCOUNTER — Encounter: Payer: Self-pay | Admitting: Family Medicine

## 2014-07-24 VITALS — BP 139/91 | HR 69 | Temp 97.4°F | Ht 70.0 in | Wt 273.0 lb

## 2014-07-24 DIAGNOSIS — A088 Other specified intestinal infections: Secondary | ICD-10-CM

## 2014-07-24 DIAGNOSIS — R109 Unspecified abdominal pain: Secondary | ICD-10-CM

## 2014-07-24 DIAGNOSIS — A084 Viral intestinal infection, unspecified: Secondary | ICD-10-CM

## 2014-07-24 DIAGNOSIS — K625 Hemorrhage of anus and rectum: Secondary | ICD-10-CM

## 2014-07-24 LAB — POCT CBC
Granulocyte percent: 61 %G (ref 37–80)
HCT, POC: 48.2 % (ref 43.5–53.7)
Hemoglobin: 16.7 g/dL (ref 14.1–18.1)
Lymph, poc: 2.4 (ref 0.6–3.4)
MCH, POC: 30.1 pg (ref 27–31.2)
MCHC: 34.8 g/dL (ref 31.8–35.4)
MCV: 86.5 fL (ref 80–97)
MPV: 7.9 fL (ref 0–99.8)
PLATELET COUNT, POC: 177 10*3/uL (ref 142–424)
POC Granulocyte: 4.5 (ref 2–6.9)
POC LYMPH PERCENT: 32.5 %L (ref 10–50)
RBC: 5.6 M/uL (ref 4.69–6.13)
RDW, POC: 13.6 %
WBC: 7.3 10*3/uL (ref 4.6–10.2)

## 2014-07-24 NOTE — Progress Notes (Signed)
Subjective:    Patient ID: Francisco Allen, male    DOB: Aug 24, 1961, 53 y.o.   MRN: [id number]  HPI Pt here for follow up and management of chronic medical problems. The patient comes in today complaining of rectal bleeding and pain on the left side. This started this morning. He has had 3-4 loose bowel movements today with this pain and it was the second was bowel movement that he noticed the blood. It was bright red. He indicates he had a colonoscopy about a year ago.        Patient Active Problem List   Diagnosis Date Noted  . Migraines 04/09/2014  . GAD (generalized anxiety disorder) 04/04/2013  . Hyperlipidemia 04/04/2013   Outpatient Encounter Prescriptions as of 07/24/2014  Medication Sig  . ALPRAZolam (XANAX) 1 MG tablet TAKE ONE TABLET BY MOUTH THREE TIMES DAILY AS NEEDED FOR ANXIETY  . aspirin 81 MG tablet Take 81 mg by mouth daily.  Marland Kitchen atorvastatin (LIPITOR) 40 MG tablet Take 1 tablet (40 mg total) by mouth daily.  . butalbital-acetaminophen-caffeine (FIORICET) 50-325-40 MG per tablet Take 1-2 tablets by mouth every 6 (six) hours as needed for headache.  . cholecalciferol (VITAMIN D) 1000 UNITS tablet Take 1,000 Units by mouth daily.  . clobetasol (TEMOVATE) 0.05 % external solution   . methylPREDNIsolone (MEDROL DOSPACK) 4 MG tablet follow package directions  . Multiple Vitamins-Minerals (MENS 50+ MULTI VITAMIN/MIN PO) Take by mouth daily.  Marland Kitchen omega-3 acid ethyl esters (LOVAZA) 1 G capsule Take 1 capsule (1 g total) by mouth 2 (two) times daily.  . traMADol (ULTRAM) 50 MG tablet TAKE ONE TABLET EVERY 8 TO 12 HOURS AS NEEDED  . vitamin C (ASCORBIC ACID) 500 MG tablet Take 500 mg by mouth. Take 2 pills daily  . [DISCONTINUED] levofloxacin (LEVAQUIN) 500 MG tablet Take 1 tablet (500 mg total) by mouth daily.    Review of Systems  Constitutional: Negative.   HENT: Negative.   Eyes: Negative.   Respiratory: Negative.   Cardiovascular: Negative.   Gastrointestinal:  Positive for anal bleeding.       Left side pain  Endocrine: Negative.   Genitourinary: Negative.   Musculoskeletal: Negative.   Skin: Negative.   Allergic/Immunologic: Negative.   Neurological: Negative.   Hematological: Negative.   Psychiatric/Behavioral: Negative.        Objective:   Physical Exam  Nursing note and vitals reviewed. Constitutional: He is oriented to person, place, and time. He appears well-developed and well-nourished. No distress.  HENT:  Head: Normocephalic and atraumatic.  Eyes: Conjunctivae and EOM are normal. Pupils are equal, round, and reactive to light. Right eye exhibits no discharge. Left eye exhibits no discharge. No scleral icterus.  Neck: Normal range of motion.  Cardiovascular: Normal rate, regular rhythm and normal heart sounds.   No murmur heard. Pulmonary/Chest: Effort normal and breath sounds normal. No respiratory distress. He has no wheezes. He has no rales. He exhibits no tenderness.  Abdominal: Soft. Bowel sounds are normal. He exhibits no mass. There is tenderness. There is no rebound and no guarding.  Obese with left lower quadrant tenderness  Musculoskeletal: Normal range of motion.  Neurological: He is alert and oriented to person, place, and time.  Skin: Skin is warm and dry. No rash noted.  Psychiatric: He has a normal mood and affect. His behavior is normal. Judgment and thought content normal.   BP 139/91  Pulse 69  Temp(Src) 97.4 F (36.3 C) (Oral)  Ht 5' 10"  (  1.778 m)  Wt 273 lb (123.832 kg)  BMI 39.17 kg/m2  Results for orders placed in visit on 07/24/14  POCT CBC      Result Value Ref Range   WBC 7.3  4.6 - 10.2 K/uL   Lymph, poc 2.4  0.6 - 3.4   POC LYMPH PERCENT 32.5  10 - 50 %L   MID (cbc)    0 - 0.9   POC MID %    0 - 12 %M   POC Granulocyte 4.5  2 - 6.9   Granulocyte percent 61.0  37 - 80 %G   RBC 5.6  4.69 - 6.13 M/uL   Hemoglobin 16.7  14.1 - 18.1 g/dL   HCT, POC 48.2  43.5 - 53.7 %   MCV 86.5  80 - 97 fL     MCH, POC 30.1  27 - 31.2 pg   MCHC 34.8  31.8 - 35.4 g/dL   RDW, POC 13.6     Platelet Count, POC 177.0  142 - 424 K/uL   MPV 7.9  0 - 99.8 fL          Assessment & Plan:  1. Left sided abdominal pain - POCT CBC - BMP8+EGFR - Hepatic function panel  2. Viral gastroenteritis  3. Rectal bleeding  Patient Instructions  Remain out of work for the next 2-3 day If rectal bleeding persists call us and we will have the gastroenterologist to see you If this is a virus and your stools get back to normal, please do a fecal occult blood test in 7-10 day--- if that is positive he will have to go back and see a gastroenterologist, if it is negative we will continue to monitor this In February 2014 you did have a colonoscopy with moderate diverticulosis and a polyp that was removed from the transverse colon.  Clear liquids for 24 hours (like 7-Up, ginger ale, Sprite, Jello, frozen pops) Full liquids the second 24-hours (like potato soup, tomato soup, chicken noodle soup) Bland diet the third 24-hours (boiled and baked foods, no fried or greasy foods) Avoid milk, cheese, ice cream and dairy products for 72 hours. Avoid caffeine (cola drinks, coffee, tea, Mountain Dew, Mellow Yellow) Take in small amounts, but frequently. Tylenol  as needed for aches pains and fever    Arrie Senate MD

## 2014-07-24 NOTE — Patient Instructions (Signed)
Remain out of work for the next 2-3 day If rectal bleeding persists call us and we will have the gastroenterologist to see you If this is a virus and your stools get back to normal, please do a fecal occult blood test in 7-10 day--- if that is positive he will have to go back and see a gastroenterologist, if it is negative we will continue to monitor this In February 2014 you did have a colonoscopy with moderate diverticulosis and a polyp that was removed from the transverse colon.  Clear liquids for 24 hours (like 7-Up, ginger ale, Sprite, Jello, frozen pops) Full liquids the second 24-hours (like potato soup, tomato soup, chicken noodle soup) Bland diet the third 24-hours (boiled and baked foods, no fried or greasy foods) Avoid milk, cheese, ice cream and dairy products for 72 hours. Avoid caffeine (cola drinks, coffee, tea, Mountain Dew, Mellow Yellow) Take in small amounts, but frequently. Tylenol  as needed for aches pains and fever

## 2014-07-25 LAB — BMP8+EGFR
BUN/Creatinine Ratio: 10 (ref 9–20)
BUN: 10 mg/dL (ref 6–24)
CALCIUM: 9.6 mg/dL (ref 8.7–10.2)
CO2: 24 mmol/L (ref 18–29)
CREATININE: 1 mg/dL (ref 0.76–1.27)
Chloride: 98 mmol/L (ref 97–108)
GFR calc Af Amer: 98 mL/min/{1.73_m2} (ref >59)
GFR calc non Af Amer: 84 mL/min/{1.73_m2} (ref >59)
Glucose: 94 mg/dL (ref 65–99)
Potassium: 4.7 mmol/L (ref 3.5–5.2)
Sodium: 140 mmol/L (ref 134–144)

## 2014-07-25 LAB — HEPATIC FUNCTION PANEL
ALBUMIN: 4.7 g/dL (ref 3.5–5.5)
ALT: 70 IU/L — ABNORMAL HIGH (ref 0–44)
AST: 57 IU/L — ABNORMAL HIGH (ref 0–40)
Alkaline Phosphatase: 77 IU/L (ref 39–117)
BILIRUBIN TOTAL: 1.1 mg/dL (ref 0.0–1.2)
Bilirubin, Direct: 0.17 mg/dL (ref 0.00–0.40)
Total Protein: 7.1 g/dL (ref 6.0–8.5)

## 2014-08-09 ENCOUNTER — Other Ambulatory Visit: Payer: BC Managed Care – PPO

## 2014-08-09 DIAGNOSIS — Z1212 Encounter for screening for malignant neoplasm of rectum: Secondary | ICD-10-CM

## 2014-08-10 LAB — FECAL OCCULT BLOOD, IMMUNOCHEMICAL: Fecal Occult Bld: NEGATIVE

## 2014-09-03 NOTE — Progress Notes (Signed)
Quick Note:  Copy of labs sent to patient ______ 

## 2014-09-20 ENCOUNTER — Encounter: Payer: Self-pay | Admitting: Nurse Practitioner

## 2014-09-20 ENCOUNTER — Ambulatory Visit (INDEPENDENT_AMBULATORY_CARE_PROVIDER_SITE_OTHER): Payer: BC Managed Care – PPO | Admitting: Nurse Practitioner

## 2014-09-20 VITALS — BP 146/93 | HR 69 | Temp 97.3°F | Ht 70.0 in | Wt 280.0 lb

## 2014-09-20 DIAGNOSIS — R059 Cough, unspecified: Secondary | ICD-10-CM

## 2014-09-20 DIAGNOSIS — J0100 Acute maxillary sinusitis, unspecified: Secondary | ICD-10-CM

## 2014-09-20 DIAGNOSIS — R05 Cough: Secondary | ICD-10-CM

## 2014-09-20 MED ORDER — AZITHROMYCIN 250 MG PO TABS: ORAL_TABLET | ORAL | Status: DC

## 2014-09-20 MED ORDER — HYDROCODONE-HOMATROPINE 5-1.5 MG/5ML PO SYRP: 5.0000 mL | ORAL_SOLUTION | Freq: Three times a day (TID) | ORAL | Status: DC | PRN

## 2014-09-20 NOTE — Progress Notes (Signed)
   Subjective:    Patient ID: Francisco Allen, male    DOB: Mar 25, 1961, 53 y.o.   MRN: [id number]  HPI Patient in c/o cough and congestion- feels weak-body aches- started this morning.    Review of Systems  Constitutional: Positive for fatigue. Negative for fever and chills.  HENT: Positive for congestion and postnasal drip.   Respiratory: Positive for cough (productive).   Cardiovascular: Negative.   Genitourinary: Negative.   Neurological: Negative.   Psychiatric/Behavioral: Negative.   All other systems reviewed and are negative.      Objective:   Physical Exam  Constitutional: He is oriented to person, place, and time. He appears well-developed and well-nourished.  HENT:  Right Ear: Hearing, tympanic membrane, external ear and ear canal normal.  Left Ear: Hearing, tympanic membrane, external ear and ear canal normal.  Nose: Mucosal edema and rhinorrhea present. Right sinus exhibits maxillary sinus tenderness. Right sinus exhibits no frontal sinus tenderness. Left sinus exhibits maxillary sinus tenderness. Left sinus exhibits no frontal sinus tenderness.  Mouth/Throat: Uvula is midline and oropharynx is clear and moist.  Eyes: Pupils are equal, round, and reactive to light.  Neck: Normal range of motion. Neck supple.  Cardiovascular: Normal rate, regular rhythm and normal heart sounds.   Pulmonary/Chest: Effort normal and breath sounds normal. No respiratory distress. He has no wheezes. He has no rales.  Dry cough  Lymphadenopathy:    He has no cervical adenopathy.  Neurological: He is alert and oriented to person, place, and time.  Skin: Skin is warm and dry.  Psychiatric: He has a normal mood and affect. His behavior is normal. Judgment and thought content normal.   BP 146/93 mmHg  Pulse 69  Temp(Src) 97.3 F (36.3 C) (Oral)  Ht 5\' 10"  (1.778 m)  Wt 280 lb (127.007 kg)  BMI 40.18 kg/m2        Assessment & Plan:   1. Acute maxillary sinusitis, recurrence not  specified   2. Cough    Meds ordered this encounter  Medications  . azithromycin (ZITHROMAX Z-PAK) 250 MG tablet    Sig: As directed    Dispense:  6 each    Refill:  0    Order Specific Question:  Supervising Provider    Answer:  Chipper Herb [[id number]]  . HYDROcodone-homatropine (HYCODAN) 5-1.5 MG/5ML syrup    Sig: Take 5 mLs by mouth every 8 (eight) hours as needed for cough.    Dispense:  120 mL    Refill:  0    Order Specific Question:  Supervising Provider    Answer:  KASPIAN, MUCCIO [[id number]]   1. Take meds as prescribed 2. Use a cool mist humidifier especially during the winter months and when heat has been humid. 3. Use saline nose sprays frequently 4. Saline irrigations of the nose can be very helpful if done frequently.  * 4X daily for 1 week*  * Use of a nettie pot can be helpful with this. Follow directions with this* 5. Drink plenty of fluids 6. Keep thermostat turn down low 7.For any cough or congestion  Use plain Mucinex- regular strength or max strength is fine   * Children- consult with Pharmacist for dosing 8. For fever or aces or pains- take tylenol or ibuprofen appropriate for age and weight.  * for fevers greater than 101 orally you may alternate ibuprofen and tylenol every  3 hours.   Mary-Margaret Hassell Done, FNP

## 2014-09-20 NOTE — Patient Instructions (Signed)
1. Take meds as prescribed 2. Use a cool mist humidifier especially during the winter months and when heat has been humid. 3. Use saline nose sprays frequently 4. Saline irrigations of the nose can be very helpful if done frequently.  * 4X daily for 1 week*  * Use of a nettie pot can be helpful with this. Follow directions with this* 5. Drink plenty of fluids 6. Keep thermostat turn down low 7.For any cough or congestion  Use plain Mucinex- regular strength or max strength is fine- NO DECONGESTANTS!   * Children- consult with Pharmacist for dosing 8. For fever or aces or pains- take tylenol or ibuprofen appropriate for age and weight.  * for fevers greater than 101 orally you may alternate ibuprofen and tylenol every  3 hours.

## 2014-10-17 ENCOUNTER — Telehealth: Payer: Self-pay | Admitting: *Deleted

## 2014-10-17 ENCOUNTER — Ambulatory Visit (INDEPENDENT_AMBULATORY_CARE_PROVIDER_SITE_OTHER): Payer: BC Managed Care – PPO | Admitting: Nurse Practitioner

## 2014-10-17 ENCOUNTER — Encounter: Payer: Self-pay | Admitting: Nurse Practitioner

## 2014-10-17 VITALS — BP 132/82 | HR 66 | Temp 98.0°F | Ht 70.0 in | Wt 275.0 lb

## 2014-10-17 DIAGNOSIS — Z6839 Body mass index (BMI) 39.0-39.9, adult: Secondary | ICD-10-CM

## 2014-10-17 DIAGNOSIS — Z125 Encounter for screening for malignant neoplasm of prostate: Secondary | ICD-10-CM

## 2014-10-17 DIAGNOSIS — E785 Hyperlipidemia, unspecified: Secondary | ICD-10-CM

## 2014-10-17 DIAGNOSIS — F411 Generalized anxiety disorder: Secondary | ICD-10-CM

## 2014-10-17 MED ORDER — ATORVASTATIN CALCIUM 40 MG PO TABS: 40.0000 mg | ORAL_TABLET | Freq: Every day | ORAL | Status: DC

## 2014-10-17 MED ORDER — ALPRAZOLAM 1 MG PO TABS: ORAL_TABLET | ORAL | Status: DC

## 2014-10-17 NOTE — Telephone Encounter (Signed)
Mr Francisco Allen want to come in on a Saturday to have his labs drawn fasting he will come in asap for labs ordered on 10/17/14 by MMM

## 2014-10-17 NOTE — Progress Notes (Signed)
  Subjective:    Patient ID: Francisco Allen, male    DOB: 29-May-1961, 53 y.o.   MRN: [id number]  Hyperlipidemia This is a chronic problem. The current episode started more than 1 year ago. The problem is controlled. Recent lipid tests were reviewed and are normal. Exacerbating diseases include obesity. He has no history of diabetes or hypothyroidism. Pertinent negatives include no chest pain, myalgias or shortness of breath. Current antihyperlipidemic treatment includes statins. The current treatment provides moderate improvement of lipids. Compliance problems include adherence to diet and adherence to exercise.  Risk factors for coronary artery disease include dyslipidemia and male sex.  GAD Takes xanax prn- on average 1 a day.    Review of Systems  Constitutional: Negative.   HENT: Negative.   Respiratory: Negative.  Negative for shortness of breath.   Cardiovascular: Negative for chest pain, palpitations and leg swelling.  Endocrine: Negative.   Genitourinary: Negative.   Musculoskeletal: Negative for myalgias.  Allergic/Immunologic: Negative.   Neurological: Negative.   Hematological: Negative.   Psychiatric/Behavioral: Negative.   All other systems reviewed and are negative.      Objective:   Physical Exam  Constitutional: He is oriented to person, place, and time. He appears well-developed and well-nourished.  HENT:  Head: Normocephalic.  Right Ear: External ear normal.  Left Ear: External ear normal.  Nose: Nose normal.  Mouth/Throat: Oropharynx is clear and moist.  Eyes: EOM are normal. Pupils are equal, round, and reactive to light.  Neck: Normal range of motion. Neck supple. No JVD present. No thyromegaly present.  Cardiovascular: Normal rate, regular rhythm, normal heart sounds and intact distal pulses.  Exam reveals no gallop and no friction rub.   No murmur heard. Pulmonary/Chest: Effort normal and breath sounds normal. No respiratory distress. He has no wheezes.  He has no rales. He exhibits no tenderness.  Abdominal: Soft. Bowel sounds are normal. He exhibits no mass. There is no tenderness.  Genitourinary: Prostate normal and penis normal.  Musculoskeletal: Normal range of motion. He exhibits no edema.  Lymphadenopathy:    He has no cervical adenopathy.  Neurological: He is alert and oriented to person, place, and time. No cranial nerve deficit.  Skin: Skin is warm and dry.  Psychiatric: He has a normal mood and affect. His behavior is normal. Judgment and thought content normal.    BP 132/82 mmHg  Pulse 66  Temp(Src) 98 F (36.7 C) (Oral)  Ht $R'5\' 10"'xu$  (1.778 m)  Wt 275 lb (124.739 kg)  BMI 39.46 kg/m2        Assessment & Plan:  1. GAD (generalized anxiety disorder) streaa manegement - ALPRAZolam (XANAX) 1 MG tablet; TAKE ONE TABLET BY MOUTH THREE TIMES DAILY AS NEEDED FOR ANXIETY  Dispense: 90 tablet; Refill: 1  2. Hyperlipidemia Low fta diet - CMP14+EGFR - NMR, lipoprofile - atorvastatin (LIPITOR) 40 MG tablet; Take 1 tablet (40 mg total) by mouth daily.  Dispense: 30 tablet; Refill: 5  3. Prostate cancer screening - PSA, total and free  4. BMI 39.0-39.9,adult Discussed diet and exercise for person with BMI >25 Will recheck weight in 3-6 months     Labs pending Health maintenance reviewed Diet and exercise encouraged Continue all meds Follow up  In 3 month   [place], FNP

## 2014-10-17 NOTE — Patient Instructions (Signed)
Fat and Cholesterol Control Diet Fat and cholesterol levels in your blood and organs are influenced by your diet. High levels of fat and cholesterol may lead to diseases of the heart, small and large blood vessels, gallbladder, liver, and pancreas. CONTROLLING FAT AND CHOLESTEROL WITH DIET Although exercise and lifestyle factors are important, your diet is key. That is because certain foods are known to raise cholesterol and others to lower it. The goal is to balance foods for their effect on cholesterol and more importantly, to replace saturated and trans fat with other types of fat, such as monounsaturated fat, polyunsaturated fat, and omega-3 fatty acids. On average, a person should consume no more than 15 to 17 g of saturated fat daily. Saturated and trans fats are considered "bad" fats, and they will raise LDL cholesterol. Saturated fats are primarily found in animal products such as meats, butter, and cream. However, that does not mean you need to give up all your favorite foods. Today, there are good tasting, low-fat, low-cholesterol substitutes for most of the things you like to eat. Choose low-fat or nonfat alternatives. Choose round or loin cuts of red meat. These types of cuts are lowest in fat and cholesterol. Chicken (without the skin), fish, veal, and ground turkey breast are great choices. Eliminate fatty meats, such as hot dogs and salami. Even shellfish have little or no saturated fat. Have a 3 oz (85 g) portion when you eat lean meat, poultry, or fish. Trans fats are also called "partially hydrogenated oils." They are oils that have been scientifically manipulated so that they are solid at room temperature resulting in a longer shelf life and improved taste and texture of foods in which they are added. Trans fats are found in stick margarine, some tub margarines, cookies, crackers, and baked goods.  When baking and cooking, oils are a great substitute for butter. The monounsaturated oils are  especially beneficial since it is believed they lower LDL and raise HDL. The oils you should avoid entirely are saturated tropical oils, such as coconut and palm.  Remember to eat a lot from food groups that are naturally free of saturated and trans fat, including fish, fruit, vegetables, beans, grains (barley, rice, couscous, bulgur wheat), and pasta (without cream sauces).  IDENTIFYING FOODS THAT LOWER FAT AND CHOLESTEROL  Soluble fiber may lower your cholesterol. This type of fiber is found in fruits such as apples, vegetables such as broccoli, potatoes, and carrots, legumes such as beans, peas, and lentils, and grains such as barley. Foods fortified with plant sterols (phytosterol) may also lower cholesterol. You should eat at least 2 g per day of these foods for a cholesterol lowering effect.  Read package labels to identify low-saturated fats, trans fat free, and low-fat foods at the supermarket. Select cheeses that have only 2 to 3 g saturated fat per ounce. Use a heart-healthy tub margarine that is free of trans fats or partially hydrogenated oil. When buying baked goods (cookies, crackers), avoid partially hydrogenated oils. Breads and muffins should be made from whole grains (whole-wheat or whole oat flour, instead of "flour" or "enriched flour"). Buy non-creamy canned soups with reduced salt and no added fats.  FOOD PREPARATION TECHNIQUES  Never deep-fry. If you must fry, either stir-fry, which uses very little fat, or use non-stick cooking sprays. When possible, broil, bake, or roast meats, and steam vegetables. Instead of putting butter or margarine on vegetables, use lemon and herbs, applesauce, and cinnamon (for squash and sweet potatoes). Use nonfat   yogurt, salsa, and low-fat dressings for salads.  LOW-SATURATED FAT / LOW-FAT FOOD SUBSTITUTES Meats / Saturated Fat (g)  Avoid: Steak, marbled (3 oz/85 g) / 11 g  Choose: Steak, lean (3 oz/85 g) / 4 g  Avoid: Hamburger (3 oz/85 g) / 7  g  Choose: Hamburger, lean (3 oz/85 g) / 5 g  Avoid: Ham (3 oz/85 g) / 6 g  Choose: Ham, lean cut (3 oz/85 g) / 2.4 g  Avoid: Chicken, with skin, dark meat (3 oz/85 g) / 4 g  Choose: Chicken, skin removed, dark meat (3 oz/85 g) / 2 g  Avoid: Chicken, with skin, light meat (3 oz/85 g) / 2.5 g  Choose: Chicken, skin removed, light meat (3 oz/85 g) / 1 g Dairy / Saturated Fat (g)  Avoid: Whole milk (1 cup) / 5 g  Choose: Low-fat milk, 2% (1 cup) / 3 g  Choose: Low-fat milk, 1% (1 cup) / 1.5 g  Choose: Skim milk (1 cup) / 0.3 g  Avoid: Hard cheese (1 oz/28 g) / 6 g  Choose: Skim milk cheese (1 oz/28 g) / 2 to 3 g  Avoid: Cottage cheese, 4% fat (1 cup) / 6.5 g  Choose: Low-fat cottage cheese, 1% fat (1 cup) / 1.5 g  Avoid: Ice cream (1 cup) / 9 g  Choose: Sherbet (1 cup) / 2.5 g  Choose: Nonfat frozen yogurt (1 cup) / 0.3 g  Choose: Frozen fruit bar / trace  Avoid: Whipped cream (1 tbs) / 3.5 g  Choose: Nondairy whipped topping (1 tbs) / 1 g Condiments / Saturated Fat (g)  Avoid: Mayonnaise (1 tbs) / 2 g  Choose: Low-fat mayonnaise (1 tbs) / 1 g  Avoid: Butter (1 tbs) / 7 g  Choose: Extra light margarine (1 tbs) / 1 g  Avoid: Coconut oil (1 tbs) / 11.8 g  Choose: Olive oil (1 tbs) / 1.8 g  Choose: Corn oil (1 tbs) / 1.7 g  Choose: Safflower oil (1 tbs) / 1.2 g  Choose: Sunflower oil (1 tbs) / 1.4 g  Choose: Soybean oil (1 tbs) / 2.4 g  Choose: Canola oil (1 tbs) / 1 g Document Released: 10/26/2005 Document Revised: 02/20/2013 Document Reviewed: 01/24/2014 [employer] Patient Information 2015 [employer]. This information is not intended to replace advice given to you by your health care provider. Make sure you discuss any questions you have with your health care provider.  

## 2014-10-18 ENCOUNTER — Encounter: Payer: Self-pay | Admitting: *Deleted

## 2014-10-18 ENCOUNTER — Ambulatory Visit: Payer: BC Managed Care – PPO | Admitting: Nurse Practitioner

## 2014-10-22 LAB — CMP14+EGFR
ALK PHOS: 80 IU/L (ref 39–117)
ALT: 49 IU/L — ABNORMAL HIGH (ref 0–44)
AST: 41 IU/L — AB (ref 0–40)
Albumin/Globulin Ratio: 1.6 (ref 1.1–2.5)
Albumin: 4.4 g/dL (ref 3.5–5.5)
BUN / CREAT RATIO: 11 (ref 9–20)
BUN: 11 mg/dL (ref 6–24)
CHLORIDE: 100 mmol/L (ref 97–108)
CO2: 22 mmol/L (ref 18–29)
Calcium: 9.5 mg/dL (ref 8.7–10.2)
Creatinine, Ser: 0.99 mg/dL (ref 0.76–1.27)
GFR calc Af Amer: 99 mL/min/{1.73_m2} (ref >59)
GFR calc non Af Amer: 85 mL/min/{1.73_m2} (ref >59)
GLOBULIN, TOTAL: 2.7 g/dL (ref 1.5–4.5)
Glucose: 104 mg/dL — ABNORMAL HIGH (ref 65–99)
Potassium: 4.9 mmol/L (ref 3.5–5.2)
SODIUM: 140 mmol/L (ref 134–144)
Total Bilirubin: 1.1 mg/dL (ref 0.0–1.2)
Total Protein: 7.1 g/dL (ref 6.0–8.5)

## 2014-10-22 LAB — NMR, LIPOPROFILE
Cholesterol: 117 mg/dL (ref 100–199)
HDL Cholesterol by NMR: 28 mg/dL — ABNORMAL LOW (ref >39)
HDL PARTICLE NUMBER: 24.6 umol/L — AB (ref >=30.5)
LDL PARTICLE NUMBER: 989 nmol/L (ref <1000)
LDL Size: 19.9 nm (ref >20.5)
LDL-C: 54 mg/dL (ref 0–99)
LP-IR SCORE: 85 — AB (ref <=45)
Small LDL Particle Number: 741 nmol/L — ABNORMAL HIGH (ref <=527)
Triglycerides by NMR: 173 mg/dL — ABNORMAL HIGH (ref 0–149)

## 2014-10-22 LAB — PSA, TOTAL AND FREE
PSA, Free Pct: 27.5 %
PSA, Free: 0.22 ng/mL
PSA: 0.8 ng/mL (ref 0.0–4.0)

## 2014-11-23 ENCOUNTER — Ambulatory Visit (INDEPENDENT_AMBULATORY_CARE_PROVIDER_SITE_OTHER): Payer: BLUE CROSS/BLUE SHIELD | Admitting: Nurse Practitioner

## 2014-11-23 ENCOUNTER — Encounter: Payer: Self-pay | Admitting: Nurse Practitioner

## 2014-11-23 VITALS — BP 137/87 | HR 64 | Temp 97.0°F | Ht 70.0 in | Wt 276.0 lb

## 2014-11-23 DIAGNOSIS — K5732 Diverticulitis of large intestine without perforation or abscess without bleeding: Secondary | ICD-10-CM

## 2014-11-23 MED ORDER — METRONIDAZOLE 500 MG PO TABS: 500.0000 mg | ORAL_TABLET | Freq: Two times a day (BID) | ORAL | Status: DC

## 2014-11-23 MED ORDER — CIPROFLOXACIN HCL 500 MG PO TABS: 500.0000 mg | ORAL_TABLET | Freq: Two times a day (BID) | ORAL | Status: DC

## 2014-11-23 NOTE — Progress Notes (Signed)
   Subjective:    Patient ID: Francisco Allen, male    DOB: 10/30/1961, 54 y.o.   MRN: [id number]  HPI Patient in today c/o left lower quadrant pain. Started several days ago- has had in the past - hx of diverticulitis Denies fever- rates abd pain 9/10 constant.    Review of Systems  Constitutional: Positive for fever.  HENT: Negative.   Respiratory: Negative.   Cardiovascular: Negative.   Gastrointestinal: Positive for vomiting and diarrhea.  Genitourinary: Negative.   Neurological: Negative.   Psychiatric/Behavioral: Negative.   All other systems reviewed and are negative.      Objective:   Physical Exam  Constitutional: He is oriented to person, place, and time. He appears well-developed and well-nourished. No distress.  Cardiovascular: Normal rate, regular rhythm and normal heart sounds.   Pulmonary/Chest: Effort normal and breath sounds normal.  Abdominal: Soft. Bowel sounds are normal. He exhibits no distension. There is tenderness (left lower quadrant pain ). There is no rebound and no guarding.  Neurological: He is alert and oriented to person, place, and time.  Skin: Skin is warm and dry.  Psychiatric: He has a normal mood and affect. His behavior is normal. Judgment and thought content normal.   BP 137/87 mmHg  Pulse 64  Temp(Src) 97 F (36.1 C) (Oral)  Ht 5\' 10"  (1.778 m)  Wt 276 lb (125.193 kg)  BMI 39.60 kg/m2        Assessment & Plan:  1. Diverticulitis of colon without hemorrhage Clear liquid diet for 24 hours- gradually increase - metroNIDAZOLE (FLAGYL) 500 MG tablet; Take 1 tablet (500 mg total) by mouth 2 (two) times daily.  Dispense: 20 tablet; Refill: 0 - ciprofloxacin (CIPRO) 500 MG tablet; Take 1 tablet (500 mg total) by mouth 2 (two) times daily.  Dispense: 20 tablet; Refill: 0  Mary-Margaret Hassell Done, FNP

## 2014-11-23 NOTE — Patient Instructions (Signed)

## 2014-12-05 ENCOUNTER — Ambulatory Visit (INDEPENDENT_AMBULATORY_CARE_PROVIDER_SITE_OTHER): Payer: BLUE CROSS/BLUE SHIELD | Admitting: Family Medicine

## 2014-12-05 ENCOUNTER — Encounter: Payer: Self-pay | Admitting: Family Medicine

## 2014-12-05 VITALS — BP 133/88 | HR 86 | Temp 97.1°F | Ht 70.0 in | Wt 271.6 lb

## 2014-12-05 DIAGNOSIS — R110 Nausea: Secondary | ICD-10-CM

## 2014-12-05 DIAGNOSIS — R5383 Other fatigue: Secondary | ICD-10-CM

## 2014-12-05 DIAGNOSIS — A084 Viral intestinal infection, unspecified: Secondary | ICD-10-CM

## 2014-12-05 MED ORDER — ONDANSETRON 8 MG PO TBDP: 8.0000 mg | ORAL_TABLET | Freq: Three times a day (TID) | ORAL | Status: DC | PRN

## 2014-12-05 NOTE — Progress Notes (Signed)
   Subjective:    Patient ID: Francisco Allen, male    DOB: Oct 06, 1961, 54 y.o.   MRN: [id number]  HPI Patient c/o Nausea, aches, fatigue since this am.  He states he feels like he is going to vomit.  Review of Systems  Constitutional: Positive for appetite change and fatigue. Negative for fever.  HENT: Negative for ear pain.   Eyes: Negative for discharge.  Respiratory: Negative for cough.   Cardiovascular: Negative for chest pain.  Gastrointestinal: Positive for nausea. Negative for abdominal distention.  Endocrine: Negative for polyuria.  Genitourinary: Negative for difficulty urinating.  Musculoskeletal: Negative for gait problem and neck pain.  Skin: Negative for color change and rash.  Neurological: Negative for speech difficulty and headaches.  Psychiatric/Behavioral: Negative for agitation.       Objective:    BP 133/88 mmHg  Pulse 86  Temp(Src) 97.1 F (36.2 C) (Oral)  Ht 5\' 10"  (1.778 m)  Wt 271 lb 9.6 oz (123.197 kg)  BMI 38.97 kg/m2 Physical Exam  Constitutional: He is oriented to person, place, and time. He appears well-developed and well-nourished.  HENT:  Head: Normocephalic and atraumatic.  Mouth/Throat: Oropharynx is clear and moist.  Eyes: Pupils are equal, round, and reactive to light.  Neck: Normal range of motion. Neck supple.  Cardiovascular: Normal rate and regular rhythm.   No murmur heard. Pulmonary/Chest: Effort normal and breath sounds normal.  Abdominal: Soft. Bowel sounds are normal. There is no tenderness.  Neurological: He is alert and oriented to person, place, and time.  Skin: Skin is warm and dry.  Psychiatric: He has a normal mood and affect.          Assessment & Plan:     ICD-9-CM ICD-10-CM   1. Viral gastroenteritis 008.8 A08.4 ondansetron (ZOFRAN ODT) 8 MG disintegrating tablet   Push po fluids, rest, tylenol and motrin otc prn as directed for fever, arthralgias, and myalgias.  Follow up prn if sx's continue or  persist.  Work note for today and tomorrow  Return if symptoms worsen or fail to improve.  Lysbeth Penner FNP

## 2015-02-13 ENCOUNTER — Ambulatory Visit (INDEPENDENT_AMBULATORY_CARE_PROVIDER_SITE_OTHER): Payer: BLUE CROSS/BLUE SHIELD | Admitting: Nurse Practitioner

## 2015-02-13 ENCOUNTER — Encounter: Payer: Self-pay | Admitting: Nurse Practitioner

## 2015-02-13 VITALS — BP 131/88 | HR 70 | Temp 97.5°F | Ht 70.0 in | Wt 281.0 lb

## 2015-02-13 DIAGNOSIS — J209 Acute bronchitis, unspecified: Secondary | ICD-10-CM | POA: Diagnosis not present

## 2015-02-13 MED ORDER — AZITHROMYCIN 250 MG PO TABS: ORAL_TABLET | ORAL | Status: DC

## 2015-02-13 MED ORDER — BENZONATATE 100 MG PO CAPS: 100.0000 mg | ORAL_CAPSULE | Freq: Three times a day (TID) | ORAL | Status: DC | PRN

## 2015-02-13 MED ORDER — METHYLPREDNISOLONE ACETATE 80 MG/ML IJ SUSP
80.0000 mg | Freq: Once | INTRAMUSCULAR | Status: AC
  Administered 2015-02-13: 80 mg via INTRAMUSCULAR

## 2015-02-13 NOTE — Patient Instructions (Signed)

## 2015-02-13 NOTE — Progress Notes (Signed)
  Subjective:     Francisco Allen is a 54 y.o. male who presents for evaluation of sinus pain. Symptoms include: congestion, cough, facial pain, headaches, nasal congestion, sinus pressure and sore throat. Onset of symptoms was 2 days ago. Symptoms have been gradually worsening since that time. Past history is significant for occasional episodes of bronchitis. Patient is a former smoker, quit 10 years ago.  The following portions of the patient's history were reviewed and updated as appropriate: allergies, current medications, past family history, past medical history, past social history, past surgical history and problem list.  Review of Systems Pertinent items are noted in HPI.   Objective:    BP 131/88 mmHg  Pulse 70  Temp(Src) 97.5 F (36.4 C) (Oral)  Ht 5\' 10"  (1.778 m)  Wt 281 lb (127.461 kg)  BMI 40.32 kg/m2 General appearance: alert and cooperative Eyes: conjunctivae/corneas clear. PERRL, EOM's intact. Fundi benign. Ears: normal TM's and external ear canals both ears Nose: green discharge, moderate congestion, turbinates red, sinus tenderness bilateral Throat: lips, mucosa, and tongue normal; teeth and gums normal Neck: no adenopathy, no carotid bruit, no JVD, supple, symmetrical, trachea midline and thyroid not enlarged, symmetric, no tenderness/mass/nodules Lungs: clear to auscultation bilaterally and deep dry cough Heart: regular rate and rhythm, S1, S2 normal, no murmur, click, rub or gallop    Assessment:    Acute bacterial sinusitis.    Plan:  1. Take meds as prescribed 2. Use a cool mist humidifier especially during the winter months and when heat has been humid. 3. Use saline nose sprays frequently 4. Saline irrigations of the nose can be very helpful if done frequently.  * 4X daily for 1 week*  * Use of a nettie pot can be helpful with this. Follow directions with this* 5. Drink plenty of fluids 6. Keep thermostat turn down low 7.For any cough or  congestion  Use plain Mucinex- regular strength or max strength is fine   * Children- consult with Pharmacist for dosing 8. For fever or aces or pains- take tylenol or ibuprofen appropriate for age and weight.  * for fevers greater than 101 orally you may alternate ibuprofen and tylenol every  3 hours.   Meds ordered this encounter  Medications  . azithromycin (ZITHROMAX Z-PAK) 250 MG tablet    Sig: As directed    Dispense:  1 each    Refill:  0    Order Specific Question:  Supervising Provider    Answer:  Chipper Herb [[id number]]  . benzonatate (TESSALON PERLES) 100 MG capsule    Sig: Take 1 capsule (100 mg total) by mouth 3 (three) times daily as needed for cough.    Dispense:  20 capsule    Refill:  0    Order Specific Question:  Supervising Provider    Answer:  Chipper Herb [[id number]]  . methylPREDNISolone acetate (DEPO-MEDROL) injection 80 mg    Sig:    Mary-Margaret Hassell Done, FNP

## 2015-02-20 ENCOUNTER — Encounter: Payer: Self-pay | Admitting: *Deleted

## 2015-04-01 ENCOUNTER — Other Ambulatory Visit: Payer: Self-pay | Admitting: Nurse Practitioner

## 2015-04-02 NOTE — Telephone Encounter (Signed)
Last filled 01/21/15, last seen 02/13/15. Call into [facility]

## 2015-04-02 NOTE — Telephone Encounter (Signed)
rx called into pharmacy

## 2015-04-02 NOTE — Telephone Encounter (Signed)
Please call in xanax with 1 refills 

## 2015-04-30 ENCOUNTER — Encounter: Payer: Self-pay | Admitting: Nurse Practitioner

## 2015-04-30 ENCOUNTER — Ambulatory Visit (INDEPENDENT_AMBULATORY_CARE_PROVIDER_SITE_OTHER): Payer: BLUE CROSS/BLUE SHIELD | Admitting: Nurse Practitioner

## 2015-04-30 VITALS — BP 138/97 | HR 84 | Temp 98.4°F | Ht 70.0 in | Wt 272.4 lb

## 2015-04-30 DIAGNOSIS — K5732 Diverticulitis of large intestine without perforation or abscess without bleeding: Secondary | ICD-10-CM | POA: Diagnosis not present

## 2015-04-30 MED ORDER — METRONIDAZOLE 500 MG PO TABS: 500.0000 mg | ORAL_TABLET | Freq: Two times a day (BID) | ORAL | Status: DC

## 2015-04-30 MED ORDER — CIPROFLOXACIN HCL 500 MG PO TABS: 500.0000 mg | ORAL_TABLET | Freq: Two times a day (BID) | ORAL | Status: DC

## 2015-04-30 NOTE — Progress Notes (Signed)
   Subjective:    Patient ID: Francisco Allen, male    DOB: Sep 26, 1961, 54 y.o.   MRN: [id number]  HPI Patient started having abdominal pain about 2 days ago with bloody stool yesterday. He has a history of diverticulosis and pain feels the same as it does when he has a flare up.    Review of Systems  Constitutional: Negative.   HENT: Negative.   Respiratory: Negative.   Cardiovascular: Negative.   Gastrointestinal: Positive for abdominal pain and blood in stool.  Genitourinary: Negative.   Skin: Negative.   Neurological: Negative.   Psychiatric/Behavioral: Negative.   All other systems reviewed and are negative.      Objective:   Physical Exam  Constitutional: He is oriented to person, place, and time. He appears well-developed and well-nourished.  Cardiovascular: Normal rate, regular rhythm and normal heart sounds.   Pulmonary/Chest: Effort normal and breath sounds normal.  Abdominal: Soft. Bowel sounds are normal. He exhibits distension. There is tenderness (left lower quadrant).  Neurological: He is alert and oriented to person, place, and time.  Skin: Skin is warm.  Psychiatric: He has a normal mood and affect. His behavior is normal. Judgment and thought content normal.    BP 138/97 mmHg  Pulse 84  Temp(Src) 98.4 F (36.9 C) (Oral)  Ht 5\' 10"  (1.778 m)  Wt 272 lb 6 oz (123.548 kg)  BMI 39.08 kg/m2       Assessment & Plan:   1. Diverticulitis of colon    Meds ordered this encounter  Medications  . ciprofloxacin (CIPRO) 500 MG tablet    Sig: Take 1 tablet (500 mg total) by mouth 2 (two) times daily.    Dispense:  20 tablet    Refill:  0    Order Specific Question:  Supervising Provider    Answer:  Chipper Herb [[id number]]  . metroNIDAZOLE (FLAGYL) 500 MG tablet    Sig: Take 1 tablet (500 mg total) by mouth 2 (two) times daily.    Dispense:  20 tablet    Refill:  0    Order Specific Question:  Supervising Provider    Answer:  FURIOUS, CHIARELLI [[id number]]    Watch diet Criss Rosales diet and increase as pain improves RTO prn  Mary-Margaret Hassell Done, FNP

## 2015-04-30 NOTE — Patient Instructions (Signed)

## 2015-05-14 ENCOUNTER — Ambulatory Visit (INDEPENDENT_AMBULATORY_CARE_PROVIDER_SITE_OTHER): Payer: BLUE CROSS/BLUE SHIELD | Admitting: Nurse Practitioner

## 2015-05-14 ENCOUNTER — Encounter: Payer: Self-pay | Admitting: Nurse Practitioner

## 2015-05-14 VITALS — BP 138/88 | HR 77 | Temp 97.3°F | Ht 70.0 in | Wt 273.0 lb

## 2015-05-14 DIAGNOSIS — Z6839 Body mass index (BMI) 39.0-39.9, adult: Secondary | ICD-10-CM

## 2015-05-14 DIAGNOSIS — F411 Generalized anxiety disorder: Secondary | ICD-10-CM | POA: Diagnosis not present

## 2015-05-14 DIAGNOSIS — E785 Hyperlipidemia, unspecified: Secondary | ICD-10-CM

## 2015-05-14 MED ORDER — ALPRAZOLAM 1 MG PO TABS: 1.0000 mg | ORAL_TABLET | Freq: Three times a day (TID) | ORAL | Status: DC | PRN

## 2015-05-14 MED ORDER — ATORVASTATIN CALCIUM 40 MG PO TABS: 40.0000 mg | ORAL_TABLET | Freq: Every day | ORAL | Status: DC

## 2015-05-14 MED ORDER — OMEGA-3-ACID ETHYL ESTERS 1 G PO CAPS: 1.0000 g | ORAL_CAPSULE | Freq: Two times a day (BID) | ORAL | Status: DC

## 2015-05-14 NOTE — Progress Notes (Signed)
Subjective:    Patient ID: Francisco Allen, male    DOB: 1961/07/10, 54 y.o.   MRN: [id number]   Patient here today for follow up of chronic medical problems. Was seen 2 weeks ago with a flare up of diveticulitis- was given flagyl and cipro and improved- he has no complaints today.   Hyperlipidemia This is a chronic problem. The current episode started more than 1 year ago. The problem is controlled. Recent lipid tests were reviewed and are normal. Exacerbating diseases include obesity. He has no history of diabetes or hypothyroidism. Pertinent negatives include no chest pain, myalgias or shortness of breath. Current antihyperlipidemic treatment includes statins. The current treatment provides moderate improvement of lipids. Compliance problems include adherence to diet and adherence to exercise.  Risk factors for coronary artery disease include dyslipidemia and male sex.  GAD Takes xanax prn- on average 1 a day.    Review of Systems  Constitutional: Negative.   HENT: Negative.   Respiratory: Negative.  Negative for shortness of breath.   Cardiovascular: Negative for chest pain, palpitations and leg swelling.  Endocrine: Negative.   Genitourinary: Negative.   Musculoskeletal: Negative for myalgias.  Allergic/Immunologic: Negative.   Neurological: Negative.   Hematological: Negative.   Psychiatric/Behavioral: Negative.   All other systems reviewed and are negative.      Objective:   Physical Exam  Constitutional: He is oriented to person, place, and time. He appears well-developed and well-nourished.  HENT:  Head: Normocephalic.  Right Ear: External ear normal.  Left Ear: External ear normal.  Nose: Nose normal.  Mouth/Throat: Oropharynx is clear and moist.  Eyes: EOM are normal. Pupils are equal, round, and reactive to light.  Neck: Normal range of motion. Neck supple. No JVD present. No thyromegaly present.  Cardiovascular: Normal rate, regular rhythm, normal heart sounds  and intact distal pulses.  Exam reveals no gallop and no friction rub.   No murmur heard. Pulmonary/Chest: Effort normal and breath sounds normal. No respiratory distress. He has no wheezes. He has no rales. He exhibits no tenderness.  Abdominal: Soft. Bowel sounds are normal. He exhibits no mass. There is no tenderness.  Genitourinary: Prostate normal and penis normal.  Musculoskeletal: Normal range of motion. He exhibits no edema.  Lymphadenopathy:    He has no cervical adenopathy.  Neurological: He is alert and oriented to person, place, and time. No cranial nerve deficit.  Skin: Skin is warm and dry.  Psychiatric: He has a normal mood and affect. His behavior is normal. Judgment and thought content normal.    BP 138/88 mmHg  Pulse 77  Temp(Src) 97.3 F (36.3 C) (Oral)  Ht _0  (1.778 m)  Wt 273 lb (123.832 kg)  BMI 39.17 kg/m2       Assessment & Plan:  1. Hyperlipidemia Low fta idet - omega-3 acid ethyl esters (LOVAZA) 1 G capsule; Take 1 capsule (1 g total) by mouth 2 (two) times daily.  Dispense: 60 capsule; Refill: 5 - atorvastatin (LIPITOR) 40 MG tablet; Take 1 tablet (40 mg total) by mouth daily.  Dispense: 30 tablet; Refill: 5 - CMP14+EGFR - Lipid panel  2. GAD (generalized anxiety disorder) Stress management - ALPRAZolam (XANAX) 1 MG tablet; Take 1 tablet (1 mg total) by mouth 3 (three) times daily as needed. for anxiety  Dispense: 90 tablet; Refill: 0  3. BMI 39.0-39.9,adult Discussed diet and exercise for person with BMI >25 Will recheck weight in 3-6 months     Labs pending Health maintenance  reviewed Diet and exercise encouraged Continue all meds Follow up  In 6 months   [place], FNP

## 2015-05-15 LAB — CMP14+EGFR
ALT: 43 IU/L (ref 0–44)
AST: 36 IU/L (ref 0–40)
Albumin/Globulin Ratio: 2.2 (ref 1.1–2.5)
Albumin: 4.9 g/dL (ref 3.5–5.5)
Alkaline Phosphatase: 79 IU/L (ref 39–117)
BUN/Creatinine Ratio: 14 (ref 9–20)
BUN: 13 mg/dL (ref 6–24)
Bilirubin Total: 1.3 mg/dL — ABNORMAL HIGH (ref 0.0–1.2)
CALCIUM: 9.9 mg/dL (ref 8.7–10.2)
CO2: 26 mmol/L (ref 18–29)
Chloride: 101 mmol/L (ref 97–108)
Creatinine, Ser: 0.9 mg/dL (ref 0.76–1.27)
GFR calc non Af Amer: 95 mL/min/{1.73_m2} (ref >59)
GFR, EST AFRICAN AMERICAN: 110 mL/min/{1.73_m2} (ref >59)
Globulin, Total: 2.2 g/dL (ref 1.5–4.5)
Glucose: 108 mg/dL — ABNORMAL HIGH (ref 65–99)
Potassium: 4.9 mmol/L (ref 3.5–5.2)
Sodium: 142 mmol/L (ref 134–144)
Total Protein: 7.1 g/dL (ref 6.0–8.5)

## 2015-05-15 LAB — LIPID PANEL
CHOLESTEROL TOTAL: 120 mg/dL (ref 100–199)
Chol/HDL Ratio: 4.4 ratio units (ref 0.0–5.0)
HDL: 27 mg/dL — ABNORMAL LOW (ref >39)
LDL Calculated: 59 mg/dL (ref 0–99)
Triglycerides: 168 mg/dL — ABNORMAL HIGH (ref 0–149)
VLDL CHOLESTEROL CAL: 34 mg/dL (ref 5–40)

## 2015-05-16 ENCOUNTER — Telehealth: Payer: Self-pay | Admitting: *Deleted

## 2015-05-16 NOTE — Telephone Encounter (Signed)
-----   Message from Sharion Balloon, [place] sent at 05/15/2015  8:29 AM EDT ----- Kidney and liver function stable LDL WNL, Triglycerides levels elevated- Pt needs to be on low fat diet

## 2015-05-16 NOTE — Telephone Encounter (Signed)
lmtcb regarding test results. 

## 2015-05-27 ENCOUNTER — Other Ambulatory Visit: Payer: Self-pay | Admitting: *Deleted

## 2015-05-27 DIAGNOSIS — E785 Hyperlipidemia, unspecified: Secondary | ICD-10-CM

## 2015-05-27 MED ORDER — ATORVASTATIN CALCIUM 40 MG PO TABS: 40.0000 mg | ORAL_TABLET | Freq: Every day | ORAL | Status: DC

## 2015-07-03 ENCOUNTER — Telehealth: Payer: Self-pay | Admitting: Nurse Practitioner

## 2015-07-03 DIAGNOSIS — K5732 Diverticulitis of large intestine without perforation or abscess without bleeding: Secondary | ICD-10-CM

## 2015-07-03 MED ORDER — CIPROFLOXACIN HCL 500 MG PO TABS: 500.0000 mg | ORAL_TABLET | Freq: Two times a day (BID) | ORAL | Status: DC

## 2015-07-03 MED ORDER — METRONIDAZOLE 500 MG PO TABS: 500.0000 mg | ORAL_TABLET | Freq: Two times a day (BID) | ORAL | Status: DC

## 2015-07-03 NOTE — Telephone Encounter (Signed)
Patient will call back to make follow up appointment

## 2015-07-03 NOTE — Telephone Encounter (Signed)
It is okay to refill his antibiotics 1. He should have an appointment for follow-up in 1-2 weeks.

## 2015-08-05 ENCOUNTER — Ambulatory Visit (INDEPENDENT_AMBULATORY_CARE_PROVIDER_SITE_OTHER): Payer: BLUE CROSS/BLUE SHIELD | Admitting: Family Medicine

## 2015-08-05 ENCOUNTER — Encounter: Payer: Self-pay | Admitting: Family Medicine

## 2015-08-05 VITALS — BP 142/89 | HR 91 | Temp 97.2°F | Ht 70.0 in | Wt 269.2 lb

## 2015-08-05 DIAGNOSIS — K5732 Diverticulitis of large intestine without perforation or abscess without bleeding: Secondary | ICD-10-CM | POA: Diagnosis not present

## 2015-08-05 MED ORDER — METRONIDAZOLE 500 MG PO TABS: 500.0000 mg | ORAL_TABLET | Freq: Two times a day (BID) | ORAL | Status: DC

## 2015-08-05 MED ORDER — CIPROFLOXACIN HCL 500 MG PO TABS: 500.0000 mg | ORAL_TABLET | Freq: Two times a day (BID) | ORAL | Status: DC

## 2015-08-05 MED ORDER — ONDANSETRON 4 MG PO TBDP: 4.0000 mg | ORAL_TABLET | Freq: Three times a day (TID) | ORAL | Status: DC | PRN

## 2015-08-05 NOTE — Progress Notes (Signed)
BP 142/89 mmHg  Pulse 91  Temp(Src) 97.2 F (36.2 C) (Oral)  Ht 5' 10"  (1.778 m)  Wt 269 lb 3.2 oz (122.108 kg)  BMI 38.63 kg/m2   Subjective:    Patient ID: Francisco Allen, male    DOB: 26-May-1961, 54 y.o.   MRN: [id number]  HPI: Francisco Allen is a 54 y.o. male presenting on 08/05/2015 for Abdominal Pain; Vomiting; and Diarrhea   HPI Abdominal pain Patient has had left lower quadrant abdominal pain since this morning. He has also had loose stools since this morning. He has been feeling flushed and feverish on a couple different occasions today as well. He also had one episode of vomiting with nausea. Pain is 3 out of 10. He is also had generally ill feeling and malaise since this started this morning as well. The pain does not radiate anywhere. He has not noticed any blood in his stools or vomitus.  Relevant past medical, surgical, family and social history reviewed and updated as indicated. Interim medical history since our last visit reviewed. Allergies and medications reviewed and updated.  Review of Systems  Constitutional: Positive for fever. Negative for chills.  HENT: Negative for ear discharge and ear pain.   Eyes: Negative for discharge and visual disturbance.  Respiratory: Negative for shortness of breath and wheezing.   Cardiovascular: Negative for chest pain and leg swelling.  Gastrointestinal: Positive for nausea, vomiting, abdominal pain and diarrhea. Negative for constipation, blood in stool and anal bleeding.  Genitourinary: Negative for dysuria, urgency, hematuria, decreased urine volume and difficulty urinating.  Musculoskeletal: Negative for back pain and gait problem.  Skin: Negative for rash.  Neurological: Negative for dizziness, syncope, light-headedness and headaches.  All other systems reviewed and are negative.   Per HPI unless specifically indicated above     Medication List       This list is accurate as of: 08/05/15 11:07 AM.  Always use  your most recent med list.               ALPRAZolam 1 MG tablet  Commonly known as:  XANAX  Take 1 tablet (1 mg total) by mouth 3 (three) times daily as needed. for anxiety     aspirin 81 MG tablet  Take 81 mg by mouth daily.     atorvastatin 40 MG tablet  Commonly known as:  LIPITOR  Take 1 tablet (40 mg total) by mouth daily.     cholecalciferol 1000 UNITS tablet  Commonly known as:  VITAMIN D  Take 1,000 Units by mouth daily.     ciprofloxacin 500 MG tablet  Commonly known as:  CIPRO  Take 1 tablet (500 mg total) by mouth 2 (two) times daily.     MENS 50+ MULTI VITAMIN/MIN PO  Take by mouth daily.     metroNIDAZOLE 500 MG tablet  Commonly known as:  FLAGYL  Take 1 tablet (500 mg total) by mouth 2 (two) times daily.     omega-3 acid ethyl esters 1 G capsule  Commonly known as:  LOVAZA  Take 1 capsule (1 g total) by mouth 2 (two) times daily.     ondansetron 4 MG disintegrating tablet  Commonly known as:  ZOFRAN ODT  Take 1 tablet (4 mg total) by mouth every 8 (eight) hours as needed for nausea or vomiting.           Objective:    BP 142/89 mmHg  Pulse 91  Temp(Src) 97.2 F (36.2  C) (Oral)  Ht 5' 10"  (1.778 m)  Wt 269 lb 3.2 oz (122.108 kg)  BMI 38.63 kg/m2  Wt Readings from Last 3 Encounters:  08/05/15 269 lb 3.2 oz (122.108 kg)  05/14/15 273 lb (123.832 kg)  04/30/15 272 lb 6 oz (123.548 kg)    Physical Exam  Constitutional: He is oriented to person, place, and time. He appears well-developed and well-nourished. No distress.  Eyes: Conjunctivae and EOM are normal. Right eye exhibits no discharge. No scleral icterus.  Cardiovascular: Normal rate, regular rhythm, normal heart sounds and intact distal pulses.   No murmur heard. Pulmonary/Chest: Effort normal and breath sounds normal. No respiratory distress. He has no wheezes.  Abdominal: Soft. Bowel sounds are normal. He exhibits no distension and no mass. There is tenderness in the left lower  quadrant. There is no rigidity, no rebound, no guarding and no CVA tenderness.  Musculoskeletal: Normal range of motion. He exhibits no edema.  Neurological: He is alert and oriented to person, place, and time. Coordination normal.  Skin: Skin is warm and dry. No rash noted. He is not diaphoretic.  Psychiatric: He has a normal mood and affect. His behavior is normal.  Vitals reviewed.   Results for orders placed or performed in visit on 05/14/15  CMP14+EGFR  Result Value Ref Range   Glucose 108 (H) 65 - 99 mg/dL   BUN 13 6 - 24 mg/dL   Creatinine, Ser 0.90 0.76 - 1.27 mg/dL   GFR calc non Af Amer 95 >59 mL/min/1.73   GFR calc Af Amer 110 >59 mL/min/1.73   BUN/Creatinine Ratio 14 9 - 20   Sodium 142 134 - 144 mmol/L   Potassium 4.9 3.5 - 5.2 mmol/L   Chloride 101 97 - 108 mmol/L   CO2 26 18 - 29 mmol/L   Calcium 9.9 8.7 - 10.2 mg/dL   Total Protein 7.1 6.0 - 8.5 g/dL   Albumin 4.9 3.5 - 5.5 g/dL   Globulin, Total 2.2 1.5 - 4.5 g/dL   Albumin/Globulin Ratio 2.2 1.1 - 2.5   Bilirubin Total 1.3 (H) 0.0 - 1.2 mg/dL   Alkaline Phosphatase 79 39 - 117 IU/L   AST 36 0 - 40 IU/L   ALT 43 0 - 44 IU/L  Lipid panel  Result Value Ref Range   Cholesterol, Total 120 100 - 199 mg/dL   Triglycerides 168 (H) 0 - 149 mg/dL   HDL 27 (L) >39 mg/dL   VLDL Cholesterol Cal 34 5 - 40 mg/dL   LDL Calculated 59 0 - 99 mg/dL   Chol/HDL Ratio 4.4 0.0 - 5.0 ratio units      Assessment & Plan:   Problem List Items Addressed This Visit    None    Visit Diagnoses    Diverticulitis of colon    -  Primary    Patient has had previous diverticulitis, symptoms that are very consistent with diverticulitis. Just started today so we will try oral abx OP, if worsens get CT    Relevant Medications    ciprofloxacin (CIPRO) 500 MG tablet    metroNIDAZOLE (FLAGYL) 500 MG tablet    ondansetron (ZOFRAN ODT) 4 MG disintegrating tablet        Follow up plan: Return if symptoms worsen or fail to  improve.  Caryl Pina, MD [facility] 08/05/2015, 11:07 AM

## 2015-08-05 NOTE — Patient Instructions (Signed)

## 2015-08-06 ENCOUNTER — Other Ambulatory Visit: Payer: Self-pay | Admitting: Nurse Practitioner

## 2015-08-06 NOTE — Telephone Encounter (Signed)
Please review and advise.

## 2015-08-06 NOTE — Telephone Encounter (Signed)
Last filled 05/25/15, lat seen 05/14/15. MMM pt. Call in at [facility]

## 2015-08-15 ENCOUNTER — Encounter: Payer: Self-pay | Admitting: Family Medicine

## 2015-08-15 ENCOUNTER — Ambulatory Visit (INDEPENDENT_AMBULATORY_CARE_PROVIDER_SITE_OTHER): Payer: BLUE CROSS/BLUE SHIELD | Admitting: Family Medicine

## 2015-08-15 VITALS — BP 140/85 | HR 84 | Temp 97.9°F | Ht 70.0 in | Wt 264.2 lb

## 2015-08-15 DIAGNOSIS — Z131 Encounter for screening for diabetes mellitus: Secondary | ICD-10-CM

## 2015-08-15 DIAGNOSIS — F411 Generalized anxiety disorder: Secondary | ICD-10-CM

## 2015-08-15 DIAGNOSIS — E785 Hyperlipidemia, unspecified: Secondary | ICD-10-CM

## 2015-08-15 MED ORDER — ALPRAZOLAM 1 MG PO TABS: 1.0000 mg | ORAL_TABLET | Freq: Three times a day (TID) | ORAL | Status: DC | PRN

## 2015-08-15 NOTE — Progress Notes (Signed)
BP 140/85 mmHg  Pulse 84  Temp(Src) 97.9 F (36.6 C) (Oral)  Ht 5' 10"  (1.778 m)  Wt 264 lb 3.2 oz (119.84 kg)  BMI 37.91 kg/m2   Subjective:    Patient ID: Francisco Allen, male    DOB: [birth year], 54 y.o.   MRN: [id number]  HPI: Francisco Allen is a 54 y.o. male presenting on 08/15/2015 for Medication Refill   HPI Anxiety He uses the occasional Xanax, once every few days he uses it. Occasionally he has a day where he uses it twice. Last time he had a refill was in July. He has never been on a maintenance medication for his anxiety and we discussed the possibility of trying one of those medications. He is going to think about it and get back to [place].  Hyperlipidemia Patient is currently on Lipitor 40 mg daily. Patient denies headaches, blurred vision, chest pains, shortness of breath, or weakness. Denies any side effects from medication and is content with current medication. He is due for recheck.  Relevant past medical, surgical, family and social history reviewed and updated as indicated. Interim medical history since our last visit reviewed. Allergies and medications reviewed and updated.  Review of Systems  Constitutional: Negative for fever and chills.  HENT: Negative for congestion, ear discharge and ear pain.   Eyes: Negative for discharge and visual disturbance.  Respiratory: Negative for chest tightness, shortness of breath and wheezing.   Cardiovascular: Negative for chest pain, palpitations and leg swelling.  Gastrointestinal: Negative for abdominal pain, diarrhea and constipation.  Genitourinary: Negative for difficulty urinating.  Musculoskeletal: Negative for back pain and gait problem.  Skin: Negative for rash.  Neurological: Negative for syncope, light-headedness and headaches.  Psychiatric/Behavioral: Negative for suicidal ideas, confusion, sleep disturbance, self-injury, dysphoric mood, decreased concentration and agitation. The patient is nervous/anxious.     All other systems reviewed and are negative.   Per HPI unless specifically indicated above     Medication List       This list is accurate as of: 08/15/15  5:03 PM.  Always use your most recent med list.               ALPRAZolam 1 MG tablet  Commonly known as:  XANAX  Take 1 tablet (1 mg total) by mouth 3 (three) times daily as needed. for anxiety     aspirin 81 MG tablet  Take 81 mg by mouth daily.     atorvastatin 40 MG tablet  Commonly known as:  LIPITOR  Take 1 tablet (40 mg total) by mouth daily.     cholecalciferol 1000 UNITS tablet  Commonly known as:  VITAMIN D  Take 1,000 Units by mouth daily.     MENS 50+ MULTI VITAMIN/MIN PO  Take by mouth daily.     omega-3 acid ethyl esters 1 G capsule  Commonly known as:  LOVAZA  Take 1 capsule (1 g total) by mouth 2 (two) times daily.           Objective:    BP 140/85 mmHg  Pulse 84  Temp(Src) 97.9 F (36.6 C) (Oral)  Ht 5' 10"  (1.778 m)  Wt 264 lb 3.2 oz (119.84 kg)  BMI 37.91 kg/m2  Wt Readings from Last 3 Encounters:  08/15/15 264 lb 3.2 oz (119.84 kg)  08/05/15 269 lb 3.2 oz (122.108 kg)  05/14/15 273 lb (123.832 kg)    Physical Exam  Constitutional: He is oriented to person, place, and time.  He appears well-developed and well-nourished. No distress.  Eyes: Conjunctivae and EOM are normal. Pupils are equal, round, and reactive to light. Right eye exhibits no discharge. No scleral icterus.  Neck: Neck supple. No thyromegaly present.  Cardiovascular: Normal rate, regular rhythm, normal heart sounds and intact distal pulses.   No murmur heard. Pulmonary/Chest: Effort normal and breath sounds normal. No respiratory distress. He has no wheezes.  Abdominal: He exhibits no distension.  Musculoskeletal: Normal range of motion. He exhibits no edema.  Lymphadenopathy:    He has no cervical adenopathy.  Neurological: He is alert and oriented to person, place, and time. Coordination normal.  Skin: Skin is  warm and dry. No rash noted. He is not diaphoretic.  Psychiatric: He has a normal mood and affect. His behavior is normal. Judgment and thought content normal.  Vitals reviewed.   Results for orders placed or performed in visit on 05/14/15  CMP14+EGFR  Result Value Ref Range   Glucose 108 (H) 65 - 99 mg/dL   BUN 13 6 - 24 mg/dL   Creatinine, Ser 0.90 0.76 - 1.27 mg/dL   GFR calc non Af Amer 95 >59 mL/min/1.73   GFR calc Af Amer 110 >59 mL/min/1.73   BUN/Creatinine Ratio 14 9 - 20   Sodium 142 134 - 144 mmol/L   Potassium 4.9 3.5 - 5.2 mmol/L   Chloride 101 97 - 108 mmol/L   CO2 26 18 - 29 mmol/L   Calcium 9.9 8.7 - 10.2 mg/dL   Total Protein 7.1 6.0 - 8.5 g/dL   Albumin 4.9 3.5 - 5.5 g/dL   Globulin, Total 2.2 1.5 - 4.5 g/dL   Albumin/Globulin Ratio 2.2 1.1 - 2.5   Bilirubin Total 1.3 (H) 0.0 - 1.2 mg/dL   Alkaline Phosphatase 79 39 - 117 IU/L   AST 36 0 - 40 IU/L   ALT 43 0 - 44 IU/L  Lipid panel  Result Value Ref Range   Cholesterol, Total 120 100 - 199 mg/dL   Triglycerides 168 (H) 0 - 149 mg/dL   HDL 27 (L) >39 mg/dL   VLDL Cholesterol Cal 34 5 - 40 mg/dL   LDL Calculated 59 0 - 99 mg/dL   Chol/HDL Ratio 4.4 0.0 - 5.0 ratio units      Assessment & Plan:   Problem List Items Addressed This Visit      Other   GAD (generalized anxiety disorder)    Use the occasional Xanax last refill was in July. Discussed going on a maintenance anxiety medication such as an SSRI or Wellbutrin. He was resistant will discuss at a later visit      Relevant Medications   ALPRAZolam (XANAX) 1 MG tablet   Hyperlipidemia - Primary    Taking atorvastatin 40, will recheck labs and see how is going. Denies any side effects.      Relevant Orders   Lipase    Other Visit Diagnoses    Screening for diabetes mellitus        Relevant Orders    CMP14+EGFR        Follow up plan: Return in about 3 months (around 11/15/2015), or if symptoms worsen or fail to improve, for recheck  anxiety.  Caryl Pina, MD [facility] 08/15/2015, 5:03 PM

## 2015-08-16 NOTE — Assessment & Plan Note (Signed)
Taking atorvastatin 40, will recheck labs and see how is going. Denies any side effects.

## 2015-08-16 NOTE — Assessment & Plan Note (Signed)
Use the occasional Xanax last refill was in July. Discussed going on a maintenance anxiety medication such as an SSRI or Wellbutrin. He was resistant will discuss at a later visit

## 2015-08-17 ENCOUNTER — Other Ambulatory Visit: Payer: BLUE CROSS/BLUE SHIELD

## 2015-08-17 DIAGNOSIS — E785 Hyperlipidemia, unspecified: Secondary | ICD-10-CM

## 2015-08-17 DIAGNOSIS — Z131 Encounter for screening for diabetes mellitus: Secondary | ICD-10-CM

## 2015-08-18 LAB — CMP14+EGFR
ALBUMIN: 4.5 g/dL (ref 3.5–5.5)
ALT: 32 IU/L (ref 0–44)
AST: 26 IU/L (ref 0–40)
Albumin/Globulin Ratio: 1.7 (ref 1.1–2.5)
Alkaline Phosphatase: 76 IU/L (ref 39–117)
BUN / CREAT RATIO: 15 (ref 9–20)
BUN: 14 mg/dL (ref 6–24)
Bilirubin Total: 1.1 mg/dL (ref 0.0–1.2)
CALCIUM: 9.3 mg/dL (ref 8.7–10.2)
CO2: 25 mmol/L (ref 18–29)
CREATININE: 0.95 mg/dL (ref 0.76–1.27)
Chloride: 98 mmol/L (ref 97–108)
GFR calc non Af Amer: 89 mL/min/{1.73_m2} (ref >59)
GFR, EST AFRICAN AMERICAN: 103 mL/min/{1.73_m2} (ref >59)
GLUCOSE: 108 mg/dL — AB (ref 65–99)
Globulin, Total: 2.6 g/dL (ref 1.5–4.5)
Potassium: 4.8 mmol/L (ref 3.5–5.2)
Sodium: 140 mmol/L (ref 134–144)
TOTAL PROTEIN: 7.1 g/dL (ref 6.0–8.5)

## 2015-08-18 LAB — LIPASE: Lipase: 50 U/L (ref 0–59)

## 2015-08-19 ENCOUNTER — Other Ambulatory Visit: Payer: Self-pay

## 2015-08-19 DIAGNOSIS — R739 Hyperglycemia, unspecified: Secondary | ICD-10-CM

## 2015-08-19 DIAGNOSIS — Z131 Encounter for screening for diabetes mellitus: Secondary | ICD-10-CM

## 2015-08-21 ENCOUNTER — Other Ambulatory Visit (INDEPENDENT_AMBULATORY_CARE_PROVIDER_SITE_OTHER): Payer: BLUE CROSS/BLUE SHIELD

## 2015-08-21 DIAGNOSIS — R739 Hyperglycemia, unspecified: Secondary | ICD-10-CM | POA: Diagnosis not present

## 2015-08-21 LAB — POCT GLYCOSYLATED HEMOGLOBIN (HGB A1C): Hemoglobin A1C: 5.7

## 2015-08-21 NOTE — Progress Notes (Signed)
Lab only 

## 2015-09-16 ENCOUNTER — Other Ambulatory Visit: Payer: Self-pay | Admitting: *Deleted

## 2015-09-16 MED ORDER — AZITHROMYCIN 250 MG PO TABS: ORAL_TABLET | ORAL | Status: DC

## 2015-09-16 NOTE — Telephone Encounter (Signed)
This med was called in to [facility] this weekend by [facility]

## 2015-10-14 ENCOUNTER — Telehealth: Payer: Self-pay | Admitting: Family Medicine

## 2015-10-21 ENCOUNTER — Encounter: Payer: Self-pay | Admitting: Family Medicine

## 2015-10-21 ENCOUNTER — Ambulatory Visit (INDEPENDENT_AMBULATORY_CARE_PROVIDER_SITE_OTHER): Payer: BLUE CROSS/BLUE SHIELD | Admitting: Family Medicine

## 2015-10-21 VITALS — BP 139/81 | HR 84 | Temp 97.2°F | Ht 70.0 in | Wt 271.6 lb

## 2015-10-21 DIAGNOSIS — K5732 Diverticulitis of large intestine without perforation or abscess without bleeding: Secondary | ICD-10-CM | POA: Diagnosis not present

## 2015-10-21 MED ORDER — CIPROFLOXACIN HCL 500 MG PO TABS: 500.0000 mg | ORAL_TABLET | Freq: Two times a day (BID) | ORAL | Status: DC

## 2015-10-21 MED ORDER — METRONIDAZOLE 500 MG PO TABS: 500.0000 mg | ORAL_TABLET | Freq: Two times a day (BID) | ORAL | Status: DC

## 2015-10-21 NOTE — Progress Notes (Signed)
BP 139/81 mmHg  Pulse 84  Temp(Src) 97.2 F (36.2 C) (Oral)  Ht 5\' 10"  (1.778 m)  Wt 271 lb 9.6 oz (123.197 kg)  BMI 38.97 kg/m2   Subjective:    Patient ID: Francisco Allen, male    DOB: July 22, 1961, 54 y.o.   MRN: [id number]  HPI: Francisco Allen is a 54 y.o. male presenting on 10/21/2015 for Abdominal Pain and Diarrhea   HPI Abdominal pain and diarrhea Patient has been having abdominal pain and diarrhea and blood in his stool that is been going on for the past day. 2 when his diverticulitis acts up and usually resolves with Cipro and Flagyl. He denies any fever but does have chills. He denies any lightheadedness or dizziness. His abdominal pain and left lower quadrant and 8 out of 10. He has been staying hydrated but not eating as much last couple days.  Relevant past medical, surgical, family and social history reviewed and updated as indicated. Interim medical history since our last visit reviewed. Allergies and medications reviewed and updated.  Review of Systems  Constitutional: Negative for fever.  HENT: Negative for ear discharge and ear pain.   Eyes: Negative for discharge and visual disturbance.  Respiratory: Negative for shortness of breath and wheezing.   Cardiovascular: Negative for chest pain and leg swelling.  Gastrointestinal: Positive for nausea, abdominal pain, diarrhea and blood in stool. Negative for vomiting, constipation, abdominal distention and anal bleeding.  Genitourinary: Negative for difficulty urinating.  Musculoskeletal: Negative for back pain and gait problem.  Skin: Negative for rash.  Neurological: Negative for dizziness, syncope, light-headedness and headaches.  All other systems reviewed and are negative.   Per HPI unless specifically indicated above     Medication List       This list is accurate as of: 10/21/15  3:41 PM.  Always use your most recent med list.               ALPRAZolam 1 MG tablet  Commonly known as:  XANAX    Take 1 tablet (1 mg total) by mouth 3 (three) times daily as needed. for anxiety     aspirin 81 MG tablet  Take 81 mg by mouth daily.     atorvastatin 40 MG tablet  Commonly known as:  LIPITOR  Take 1 tablet (40 mg total) by mouth daily.     cholecalciferol 1000 UNITS tablet  Commonly known as:  VITAMIN D  Take 1,000 Units by mouth daily.     ciprofloxacin 500 MG tablet  Commonly known as:  CIPRO  Take 1 tablet (500 mg total) by mouth 2 (two) times daily.     MENS 50+ MULTI VITAMIN/MIN PO  Take by mouth daily.     metroNIDAZOLE 500 MG tablet  Commonly known as:  FLAGYL  Take 1 tablet (500 mg total) by mouth 2 (two) times daily.     omega-3 acid ethyl esters 1 G capsule  Commonly known as:  LOVAZA  Take 1 capsule (1 g total) by mouth 2 (two) times daily.     vitamin C 500 MG tablet  Commonly known as:  ASCORBIC ACID  Take 500 mg by mouth daily.           Objective:    BP 139/81 mmHg  Pulse 84  Temp(Src) 97.2 F (36.2 C) (Oral)  Ht 5\' 10"  (1.778 m)  Wt 271 lb 9.6 oz (123.197 kg)  BMI 38.97 kg/m2  Wt Readings from Last 3  Encounters:  10/21/15 271 lb 9.6 oz (123.197 kg)  08/15/15 264 lb 3.2 oz (119.84 kg)  08/05/15 269 lb 3.2 oz (122.108 kg)    Physical Exam  Constitutional: He is oriented to person, place, and time. He appears well-developed and well-nourished. No distress.  Eyes: Conjunctivae and EOM are normal. Pupils are equal, round, and reactive to light. Right eye exhibits no discharge. No scleral icterus.  Neck: Neck supple. No thyromegaly present.  Cardiovascular: Normal rate, regular rhythm, normal heart sounds and intact distal pulses.   No murmur heard. Pulmonary/Chest: Effort normal and breath sounds normal. No respiratory distress. He has no wheezes.  Abdominal: Soft. Bowel sounds are normal. He exhibits no distension. There is tenderness in the left lower quadrant. There is no rigidity, no rebound, no guarding, no CVA tenderness, no tenderness at  McBurney's point and negative Murphy's sign. No hernia.  Musculoskeletal: Normal range of motion. He exhibits no edema.  Lymphadenopathy:    He has no cervical adenopathy.  Neurological: He is alert and oriented to person, place, and time. Coordination normal.  Skin: Skin is warm and dry. No rash noted. He is not diaphoretic.  Psychiatric: He has a normal mood and affect. His behavior is normal.  Vitals reviewed.   Results for orders placed or performed in visit on 08/21/15  POCT glycosylated hemoglobin (Hb A1C)  Result Value Ref Range   Hemoglobin A1C 5.7       Assessment & Plan:   Problem List Items Addressed This Visit      Digestive   Diverticulitis of colon - Primary   Relevant Medications   ciprofloxacin (CIPRO) 500 MG tablet   metroNIDAZOLE (FLAGYL) 500 MG tablet       Follow up plan: Return if symptoms worsen or fail to improve.  Counseling provided for all of the vaccine components No orders of the defined types were placed in this encounter.    Caryl Pina, MD [facility] 10/21/2015, 3:41 PM

## 2015-10-21 NOTE — Telephone Encounter (Signed)
PT HAS APT 12/12 W/ PROVIDER NOTED IN APT NOTES

## 2015-10-28 ENCOUNTER — Other Ambulatory Visit: Payer: Self-pay | Admitting: Family Medicine

## 2015-10-29 NOTE — Telephone Encounter (Signed)
Last seen 10/21/15  Dr Dettinger  If approved route to nurse to call into [facility]

## 2015-10-30 NOTE — Telephone Encounter (Signed)
rx called into pharmacy

## 2015-10-30 NOTE — Telephone Encounter (Signed)
Go ahead and print refill

## 2015-11-19 ENCOUNTER — Ambulatory Visit (INDEPENDENT_AMBULATORY_CARE_PROVIDER_SITE_OTHER): Payer: BLUE CROSS/BLUE SHIELD | Admitting: Family Medicine

## 2015-11-19 ENCOUNTER — Encounter: Payer: Self-pay | Admitting: Family Medicine

## 2015-11-19 VITALS — BP 130/82 | HR 78 | Temp 97.3°F | Ht 70.0 in | Wt 271.0 lb

## 2015-11-19 DIAGNOSIS — F411 Generalized anxiety disorder: Secondary | ICD-10-CM | POA: Diagnosis not present

## 2015-11-20 NOTE — Assessment & Plan Note (Signed)
Patient has a stable on current doses, discussed putting him on a maintenance medication such as an SSRI but patient is resistant, we'll discuss in the future.

## 2015-11-20 NOTE — Progress Notes (Signed)
BP 130/82 mmHg  Pulse 78  Temp(Src) 97.3 F (36.3 C) (Oral)  Ht 5\' 10"  (1.778 m)  Wt 271 lb (122.925 kg)  BMI 38.88 kg/m2   Subjective:    Patient ID: Francisco Allen, male    DOB: 06-01-1961, 55 y.o.   MRN: [id number]  HPI: Francisco Allen is a 55 y.o. male presenting on 11/19/2015 for Anxiety   HPI Anxiety recheck Patient is coming in today for anxiety recheck. He says his anxiety level still gets buildup because of his wife's sister's element and his son's issues with girlfriends. He does not take the Xanax every day but when he does use it uses it mainly at bedtime for sleep. He denies any thoughts of suicide or thoughts of hurting himself. He is not on a controller medication and is very resistant to go on one. He is not currently seeing a counselor and is resistant to obtain one.  Relevant past medical, surgical, family and social history reviewed and updated as indicated. Interim medical history since our last visit reviewed. Allergies and medications reviewed and updated.  Review of Systems  Constitutional: Negative for fever.  HENT: Negative for ear discharge and ear pain.   Eyes: Negative for discharge and visual disturbance.  Respiratory: Negative for shortness of breath and wheezing.   Cardiovascular: Negative for chest pain and leg swelling.  Gastrointestinal: Negative for abdominal pain, diarrhea and constipation.  Genitourinary: Negative for difficulty urinating.  Musculoskeletal: Negative for back pain and gait problem.  Skin: Negative for rash.  Neurological: Negative for syncope, light-headedness and headaches.  Psychiatric/Behavioral: Positive for sleep disturbance. Negative for suicidal ideas, behavioral problems, self-injury, dysphoric mood, decreased concentration and agitation. The patient is nervous/anxious.   All other systems reviewed and are negative.   Per HPI unless specifically indicated above     Medication List       This list is accurate  as of: 11/19/15 11:59 PM.  Always use your most recent med list.               ALPRAZolam 1 MG tablet  Commonly known as:  XANAX  TAKE ONE TABLET BY MOUTH THREE TIMES DAILY AS NEEDED FOR ANXIETY     aspirin 81 MG tablet  Take 81 mg by mouth daily.     atorvastatin 40 MG tablet  Commonly known as:  LIPITOR  Take 1 tablet (40 mg total) by mouth daily.     cholecalciferol 1000 units tablet  Commonly known as:  VITAMIN D  Take 1,000 Units by mouth daily.     MENS 50+ MULTI VITAMIN/MIN PO  Take by mouth daily.     omega-3 acid ethyl esters 1 g capsule  Commonly known as:  LOVAZA  Take 1 capsule (1 g total) by mouth 2 (two) times daily.     vitamin C 500 MG tablet  Commonly known as:  ASCORBIC ACID  Take 500 mg by mouth daily.           Objective:    BP 130/82 mmHg  Pulse 78  Temp(Src) 97.3 F (36.3 C) (Oral)  Ht 5\' 10"  (1.778 m)  Wt 271 lb (122.925 kg)  BMI 38.88 kg/m2  Wt Readings from Last 3 Encounters:  11/19/15 271 lb (122.925 kg)  10/21/15 271 lb 9.6 oz (123.197 kg)  08/15/15 264 lb 3.2 oz (119.84 kg)    Physical Exam  Constitutional: He is oriented to person, place, and time. He appears well-developed and well-nourished. No  distress.  Eyes: Conjunctivae and EOM are normal. Pupils are equal, round, and reactive to light. Right eye exhibits no discharge. No scleral icterus.  Neck: Neck supple. No thyromegaly present.  Cardiovascular: Normal rate, regular rhythm, normal heart sounds and intact distal pulses.   No murmur heard. Pulmonary/Chest: Effort normal and breath sounds normal. No respiratory distress. He has no wheezes.  Musculoskeletal: Normal range of motion. He exhibits no edema.  Lymphadenopathy:    He has no cervical adenopathy.  Neurological: He is alert and oriented to person, place, and time. Coordination normal.  Skin: Skin is warm and dry. No rash noted. He is not diaphoretic.  Psychiatric: His speech is normal and behavior is normal.  Judgment and thought content normal. His mood appears anxious. His affect is blunt. His affect is not angry, not labile and not inappropriate. He does not exhibit a depressed mood. He expresses no suicidal ideation. He expresses no suicidal plans.  Vitals reviewed.   Results for orders placed or performed in visit on 08/21/15  POCT glycosylated hemoglobin (Hb A1C)  Result Value Ref Range   Hemoglobin A1C 5.7       Assessment & Plan:   Problem List Items Addressed This Visit      Other   GAD (generalized anxiety disorder) - Primary    Patient has a stable on current doses, discussed putting him on a maintenance medication such as an SSRI but patient is resistant, we'll discuss in the future.          Follow up plan: Return in about 3 months (around 02/17/2016), or if symptoms worsen or fail to improve, for labs, cholesterol.  Counseling provided for all of the vaccine components No orders of the defined types were placed in this encounter.    Caryl Pina, MD [facility] 11/19/2015, 4:55 PM

## 2016-01-10 ENCOUNTER — Other Ambulatory Visit: Payer: Self-pay | Admitting: Family Medicine

## 2016-01-10 NOTE — Telephone Encounter (Signed)
Refill printed 

## 2016-01-10 NOTE — Telephone Encounter (Signed)
Last filled 10/30/15, last seen 11/19/15. Route to pool, nurse call in at [facility]

## 2016-01-11 NOTE — Telephone Encounter (Signed)
Refill called to [facility] 

## 2016-01-21 ENCOUNTER — Encounter: Payer: Self-pay | Admitting: Pediatrics

## 2016-01-21 ENCOUNTER — Ambulatory Visit (INDEPENDENT_AMBULATORY_CARE_PROVIDER_SITE_OTHER): Payer: BLUE CROSS/BLUE SHIELD | Admitting: Pediatrics

## 2016-01-21 ENCOUNTER — Ambulatory Visit: Payer: BLUE CROSS/BLUE SHIELD

## 2016-01-21 VITALS — BP 161/98 | HR 69 | Temp 97.1°F | Ht 70.0 in | Wt 280.4 lb

## 2016-01-21 DIAGNOSIS — K5793 Diverticulitis of intestine, part unspecified, without perforation or abscess with bleeding: Secondary | ICD-10-CM | POA: Diagnosis not present

## 2016-01-21 MED ORDER — METRONIDAZOLE 500 MG PO TABS: 500.0000 mg | ORAL_TABLET | Freq: Three times a day (TID) | ORAL | Status: DC

## 2016-01-21 MED ORDER — CIPROFLOXACIN HCL 500 MG PO TABS: 500.0000 mg | ORAL_TABLET | Freq: Two times a day (BID) | ORAL | Status: DC

## 2016-01-21 NOTE — Progress Notes (Signed)
    Subjective:    Patient ID: Francisco Allen, male    DOB: November 03, 1961, 55 y.o.   MRN: [id number]  CC: Rectal Bleeding   HPI: Francisco Allen is a 55 y.o. male presenting for Rectal Bleeding  Has belly pain starting this morning Had brown diarrhea starting this morning Started having fecal urgency Had L sided abd pain as well Second stool noticed blood in stool Has had 3-4 total bloody stools today Feels different than the hemorrhoids, has had hemorrhoids removed  Has been treated several times for diverticulitis Feels just like prior diverticulitis episodes    Depression screen [facility] 2/9 01/21/2016 11/19/2015 10/21/2015 08/15/2015 08/05/2015  Decreased Interest 0 0 0 0 0  Down, Depressed, Hopeless 0 0 0 0 0  PHQ - 2 Score 0 0 0 0 0     Relevant past medical, surgical, family and social history reviewed and updated as indicated. Interim medical history since our last visit reviewed. Allergies and medications reviewed and updated.    ROS: Per HPI unless specifically indicated above  History  Smoking status  . Current Every Day Smoker -- 1.00 packs/day for 28 years  . Types: Cigarettes  Smokeless tobacco  . Never Used    Past Medical History Patient Active Problem List   Diagnosis Date Noted  . Diverticulitis of colon 10/21/2015  . BMI 39.0-39.9,adult 05/14/2015  . Migraines 04/09/2014  . GAD (generalized anxiety disorder) 04/04/2013  . Hyperlipidemia 04/04/2013        Objective:    BP 161/98 mmHg  Pulse 69  Temp(Src) 97.1 F (36.2 C) (Oral)  Ht 5\' 10"  (1.778 m)  Wt 280 lb 6.4 oz (127.189 kg)  BMI 40.23 kg/m2  Wt Readings from Last 3 Encounters:  01/21/16 280 lb 6.4 oz (127.189 kg)  11/19/15 271 lb (122.925 kg)  10/21/15 271 lb 9.6 oz (123.197 kg)     Gen: NAD, alert, cooperative with exam, NCAT EYES: EOMI, no scleral injection or icterus ENT:  OP without erythema CV: NRRR, normal S1/S2, no murmur, distal pulses 2+ b/l Resp: CTABL, no wheezes,  normal WOB Abd: +BS, soft, mildly tender, ND. No peritoneal signs. no guarding or organomegaly Ext: No edema, warm Neuro: Alert and oriented, strength equal b/l UE and LE, coordination grossly normal     Assessment & Plan:    Korvin was seen today for rectal bleeding.  Diagnoses and all orders for this visit:  Diverticulitis of intestine without perforation or abscess with bleeding -     ciprofloxacin (CIPRO) 500 MG tablet; Take 1 tablet (500 mg total) by mouth 2 (two) times daily. -     metroNIDAZOLE (FLAGYL) 500 MG tablet; Take 1 tablet (500 mg total) by mouth 3 (three) times daily.    Follow up plan: Return if symptoms worsen or fail to improve.  Assunta Found, MD [facility] 01/21/2016, 4:56 PM

## 2016-02-10 ENCOUNTER — Encounter: Payer: Self-pay | Admitting: Family Medicine

## 2016-02-10 ENCOUNTER — Ambulatory Visit (INDEPENDENT_AMBULATORY_CARE_PROVIDER_SITE_OTHER): Payer: BLUE CROSS/BLUE SHIELD | Admitting: Family Medicine

## 2016-02-10 VITALS — BP 139/87 | HR 84 | Temp 97.4°F | Ht 70.0 in | Wt 275.0 lb

## 2016-02-10 DIAGNOSIS — F411 Generalized anxiety disorder: Secondary | ICD-10-CM | POA: Diagnosis not present

## 2016-02-10 DIAGNOSIS — E785 Hyperlipidemia, unspecified: Secondary | ICD-10-CM | POA: Diagnosis not present

## 2016-02-10 DIAGNOSIS — Z6839 Body mass index (BMI) 39.0-39.9, adult: Secondary | ICD-10-CM | POA: Diagnosis not present

## 2016-02-10 NOTE — Progress Notes (Signed)
BP 139/87 mmHg  Pulse 84  Temp(Src) 97.4 F (36.3 C) (Oral)  Ht 5' 10"  (1.778 m)  Wt 275 lb (124.739 kg)  BMI 39.46 kg/m2   Subjective:    Patient ID: Francisco Allen, male    DOB: 1961-09-17, 55 y.o.   MRN: [id number]  HPI: RALLY OUCH is a 55 y.o. male presenting on 02/10/2016 for Hyperlipidemia and Anxiety   HPI Anxiety recheck Patient comes in today for anxiety recheck. We have discussed possible medications for maintenance. He is currently having to take the Xanax a few times daily. He does not have to do that every day but some days it is building up to that point. He says his anxiety has been building up at times. He denies any suicidal ideations or thoughts. Himself.  Hyperlipidemia Patient takes fish oils and Lipitor for his cholesterol. And is due for recheck today. He denies any issues with this medication and feels like it is doing well for him. He is still morbidly obese and has a BMI above 39.  Relevant past medical, surgical, family and social history reviewed and updated as indicated. Interim medical history since our last visit reviewed. Allergies and medications reviewed and updated.  Review of Systems  Constitutional: Negative for fever.  HENT: Negative for ear discharge and ear pain.   Eyes: Negative for discharge and visual disturbance.  Respiratory: Negative for shortness of breath and wheezing.   Cardiovascular: Negative for chest pain and leg swelling.  Gastrointestinal: Negative for abdominal pain, diarrhea and constipation.  Genitourinary: Negative for difficulty urinating.  Musculoskeletal: Negative for back pain and gait problem.  Skin: Negative for rash.  Neurological: Negative for syncope, light-headedness and headaches.  Psychiatric/Behavioral: Positive for decreased concentration. Negative for suicidal ideas, behavioral problems, sleep disturbance, self-injury, dysphoric mood and agitation. The patient is nervous/anxious.   All other systems  reviewed and are negative.   Per HPI unless specifically indicated above     Medication List       This list is accurate as of: 02/10/16  5:03 PM.  Always use your most recent med list.               ALPRAZolam 1 MG tablet  Commonly known as:  XANAX  TAKE ONE TABLET BY MOUTH THREE TIMES DAILY     aspirin 81 MG tablet  Take 81 mg by mouth daily.     atorvastatin 40 MG tablet  Commonly known as:  LIPITOR  Take 1 tablet (40 mg total) by mouth daily.     cholecalciferol 1000 units tablet  Commonly known as:  VITAMIN D  Take 1,000 Units by mouth daily.     MENS 50+ MULTI VITAMIN/MIN PO  Take by mouth daily.     omega-3 acid ethyl esters 1 g capsule  Commonly known as:  LOVAZA  Take 1 capsule (1 g total) by mouth 2 (two) times daily.     vitamin C 500 MG tablet  Commonly known as:  ASCORBIC ACID  Take 500 mg by mouth daily.           Objective:    BP 139/87 mmHg  Pulse 84  Temp(Src) 97.4 F (36.3 C) (Oral)  Ht 5' 10"  (1.778 m)  Wt 275 lb (124.739 kg)  BMI 39.46 kg/m2  Wt Readings from Last 3 Encounters:  02/10/16 275 lb (124.739 kg)  01/21/16 280 lb 6.4 oz (127.189 kg)  11/19/15 271 lb (122.925 kg)    Physical Exam  Constitutional: He is oriented to person, place, and time. He appears well-developed and well-nourished. No distress.  Eyes: Conjunctivae and EOM are normal. Pupils are equal, round, and reactive to light. Right eye exhibits no discharge. No scleral icterus.  Neck: Neck supple. No thyromegaly present.  Cardiovascular: Normal rate, regular rhythm, normal heart sounds and intact distal pulses.   No murmur heard. Pulmonary/Chest: Effort normal and breath sounds normal. No respiratory distress. He has no wheezes.  Musculoskeletal: Normal range of motion. He exhibits no edema.  Lymphadenopathy:    He has no cervical adenopathy.  Neurological: He is alert and oriented to person, place, and time. Coordination normal.  Skin: Skin is warm and dry. No  rash noted. He is not diaphoretic.  Psychiatric: He has a normal mood and affect. His behavior is normal.  Nursing note and vitals reviewed.   Results for orders placed or performed in visit on 08/21/15  POCT glycosylated hemoglobin (Hb A1C)  Result Value Ref Range   Hemoglobin A1C 5.7       Assessment & Plan:   Problem List Items Addressed This Visit      Other   GAD (generalized anxiety disorder) - Primary   Relevant Medications   escitalopram (LEXAPRO) 10 MG tablet   Hyperlipidemia   Relevant Orders   CMP14+EGFR   Lipid panel   BMI 39.0-39.9,adult   Relevant Orders   CMP14+EGFR   Lipid panel       Follow up plan: Return in about 3 months (around 05/11/2016), or if symptoms worsen or fail to improve, for recheck anxiety.  Counseling provided for all of the vaccine components Orders Placed This Encounter  Procedures  . CMP14+EGFR  . Lipid panel    Caryl Pina, MD [facility] 02/10/2016, 5:03 PM

## 2016-02-11 MED ORDER — ESCITALOPRAM OXALATE 10 MG PO TABS: 10.0000 mg | ORAL_TABLET | Freq: Every day | ORAL | Status: DC

## 2016-02-14 ENCOUNTER — Encounter: Payer: Self-pay | Admitting: *Deleted

## 2016-02-15 ENCOUNTER — Other Ambulatory Visit: Payer: BLUE CROSS/BLUE SHIELD

## 2016-02-15 DIAGNOSIS — Z6839 Body mass index (BMI) 39.0-39.9, adult: Secondary | ICD-10-CM

## 2016-02-15 DIAGNOSIS — E785 Hyperlipidemia, unspecified: Secondary | ICD-10-CM

## 2016-02-16 LAB — CMP14+EGFR
ALT: 45 IU/L — AB (ref 0–44)
AST: 38 IU/L (ref 0–40)
Albumin/Globulin Ratio: 1.8 (ref 1.2–2.2)
Albumin: 4.6 g/dL (ref 3.5–5.5)
Alkaline Phosphatase: 82 IU/L (ref 39–117)
BUN/Creatinine Ratio: 15 (ref 9–20)
BUN: 15 mg/dL (ref 6–24)
Bilirubin Total: 1.2 mg/dL (ref 0.0–1.2)
CALCIUM: 9.7 mg/dL (ref 8.7–10.2)
CO2: 23 mmol/L (ref 18–29)
CREATININE: 0.97 mg/dL (ref 0.76–1.27)
Chloride: 98 mmol/L (ref 96–106)
GFR calc Af Amer: 100 mL/min/{1.73_m2} (ref >59)
GFR, EST NON AFRICAN AMERICAN: 86 mL/min/{1.73_m2} (ref >59)
Globulin, Total: 2.6 g/dL (ref 1.5–4.5)
Glucose: 114 mg/dL — ABNORMAL HIGH (ref 65–99)
Potassium: 4.8 mmol/L (ref 3.5–5.2)
Sodium: 141 mmol/L (ref 134–144)
Total Protein: 7.2 g/dL (ref 6.0–8.5)

## 2016-02-16 LAB — LIPID PANEL
CHOL/HDL RATIO: 6.7 ratio — AB (ref 0.0–5.0)
Cholesterol, Total: 160 mg/dL (ref 100–199)
HDL: 24 mg/dL — AB (ref >39)
LDL CALC: 76 mg/dL (ref 0–99)
TRIGLYCERIDES: 299 mg/dL — AB (ref 0–149)
VLDL Cholesterol Cal: 60 mg/dL — ABNORMAL HIGH (ref 5–40)

## 2016-02-19 ENCOUNTER — Other Ambulatory Visit: Payer: Self-pay

## 2016-02-19 DIAGNOSIS — Z131 Encounter for screening for diabetes mellitus: Secondary | ICD-10-CM | POA: Insufficient documentation

## 2016-02-20 ENCOUNTER — Other Ambulatory Visit: Payer: BLUE CROSS/BLUE SHIELD

## 2016-02-20 DIAGNOSIS — Z131 Encounter for screening for diabetes mellitus: Secondary | ICD-10-CM

## 2016-02-20 LAB — BAYER DCA HB A1C WAIVED: HB A1C (BAYER DCA - WAIVED): 6.1 % (ref <7.0)

## 2016-03-04 ENCOUNTER — Ambulatory Visit (INDEPENDENT_AMBULATORY_CARE_PROVIDER_SITE_OTHER): Payer: BLUE CROSS/BLUE SHIELD | Admitting: Family Medicine

## 2016-03-04 ENCOUNTER — Encounter: Payer: Self-pay | Admitting: Family Medicine

## 2016-03-04 VITALS — BP 130/84 | HR 80 | Temp 97.0°F | Ht 70.0 in | Wt 278.4 lb

## 2016-03-04 DIAGNOSIS — K5732 Diverticulitis of large intestine without perforation or abscess without bleeding: Secondary | ICD-10-CM | POA: Diagnosis not present

## 2016-03-04 MED ORDER — SULFAMETHOXAZOLE-TRIMETHOPRIM 800-160 MG PO TABS: 1.0000 | ORAL_TABLET | Freq: Two times a day (BID) | ORAL | Status: DC

## 2016-03-04 MED ORDER — METRONIDAZOLE 500 MG PO TABS: 500.0000 mg | ORAL_TABLET | Freq: Two times a day (BID) | ORAL | Status: DC

## 2016-03-04 NOTE — Progress Notes (Signed)
BP 130/84 mmHg  Pulse 80  Temp(Src) 97 F (36.1 C) (Oral)  Ht 5\' 10"  (1.778 m)  Wt 278 lb 6.4 oz (126.281 kg)  BMI 39.95 kg/m2   Subjective:    Patient ID: [name], male    DOB: November 28, 1960, 55 y.o.   MRN: [id number]  HPI: ALMER LANCER is a 55 y.o. male presenting on 03/04/2016 for Blood In Stools and Abdominal Pain   HPI Abdominal pain and diarrhea and blood in stool Patient has a 2 day history of left lower quadrant abdominal pain and diarrhea and blood in his stools similar to when he is had diverticulitis previously. Previously he had been treated with ciprofloxacin and Flagyl and it clears up but it seems to be coming back more frequently this last year than it usually does. He denies any dietary changes from normal. He denies any fevers or chills. He has had general aches and malaise along with it. His energy is also down as well.  Relevant past medical, surgical, family and social history reviewed and updated as indicated. Interim medical history since our last visit reviewed. Allergies and medications reviewed and updated.  Review of Systems  Constitutional: Negative for fever.  HENT: Negative for ear discharge and ear pain.   Eyes: Negative for discharge and visual disturbance.  Respiratory: Negative for shortness of breath and wheezing.   Cardiovascular: Negative for chest pain and leg swelling.  Gastrointestinal: Positive for abdominal pain, diarrhea and blood in stool. Negative for nausea, vomiting and constipation.  Genitourinary: Negative for difficulty urinating.  Musculoskeletal: Negative for back pain and gait problem.  Skin: Negative for rash.  Neurological: Negative for syncope, light-headedness and headaches.  All other systems reviewed and are negative.   Per HPI unless specifically indicated above     Medication List       This list is accurate as of: 03/04/16 12:04 PM.  Always use your most recent med list.               ALPRAZolam 1  MG tablet  Commonly known as:  XANAX  TAKE ONE TABLET BY MOUTH THREE TIMES DAILY     aspirin 81 MG tablet  Take 81 mg by mouth daily.     atorvastatin 40 MG tablet  Commonly known as:  LIPITOR  Take 1 tablet (40 mg total) by mouth daily.     cholecalciferol 1000 units tablet  Commonly known as:  VITAMIN D  Take 1,000 Units by mouth daily.     escitalopram 10 MG tablet  Commonly known as:  LEXAPRO  Take 1 tablet (10 mg total) by mouth daily.     MENS 50+ MULTI VITAMIN/MIN PO  Take by mouth daily.     metroNIDAZOLE 500 MG tablet  Commonly known as:  FLAGYL  Take 1 tablet (500 mg total) by mouth 2 (two) times daily.     omega-3 acid ethyl esters 1 g capsule  Commonly known as:  LOVAZA  Take 1 capsule (1 g total) by mouth 2 (two) times daily.     sulfamethoxazole-trimethoprim 800-160 MG tablet  Commonly known as:  BACTRIM DS,SEPTRA DS  Take 1 tablet by mouth 2 (two) times daily.     vitamin C 500 MG tablet  Commonly known as:  ASCORBIC ACID  Take 500 mg by mouth daily.           Objective:    BP 130/84 mmHg  Pulse 80  Temp(Src) 97 F (36.1  C) (Oral)  Ht 5\' 10"  (1.778 m)  Wt 278 lb 6.4 oz (126.281 kg)  BMI 39.95 kg/m2  Wt Readings from Last 3 Encounters:  03/04/16 278 lb 6.4 oz (126.281 kg)  02/10/16 275 lb (124.739 kg)  01/21/16 280 lb 6.4 oz (127.189 kg)    Physical Exam  Constitutional: He is oriented to person, place, and time. He appears well-developed and well-nourished. No distress.  Eyes: Conjunctivae and EOM are normal. Pupils are equal, round, and reactive to light. Right eye exhibits no discharge. No scleral icterus.  Cardiovascular: Normal rate, regular rhythm, normal heart sounds and intact distal pulses.   No murmur heard. Pulmonary/Chest: Effort normal and breath sounds normal. No respiratory distress. He has no wheezes.  Abdominal: Soft. Bowel sounds are normal. He exhibits no distension. There is tenderness in the left lower quadrant. There is  no rigidity, no rebound, no guarding, no CVA tenderness, no tenderness at McBurney's point and negative Murphy's sign.  Musculoskeletal: Normal range of motion. He exhibits no edema.  Neurological: He is alert and oriented to person, place, and time. Coordination normal.  Skin: Skin is warm and dry. No rash noted. He is not diaphoretic.  Psychiatric: He has a normal mood and affect. His behavior is normal.  Vitals reviewed.     Assessment & Plan:   Problem List Items Addressed This Visit      Digestive   Diverticulitis of colon - Primary   Relevant Medications   sulfamethoxazole-trimethoprim (BACTRIM DS,SEPTRA DS) 800-160 MG tablet   metroNIDAZOLE (FLAGYL) 500 MG tablet      Follow up plan: Return if symptoms worsen or fail to improve.  Counseling provided for all of the vaccine components No orders of the defined types were placed in this encounter.    Caryl Pina, MD [facility] 03/04/2016, 12:04 PM

## 2016-03-30 ENCOUNTER — Other Ambulatory Visit: Payer: Self-pay | Admitting: Family Medicine

## 2016-03-30 NOTE — Telephone Encounter (Signed)
Last filled 01/11/16, last seen 02/10/16. Route to pool.

## 2016-04-03 NOTE — Telephone Encounter (Signed)
Called into [facility]

## 2016-04-15 ENCOUNTER — Ambulatory Visit (INDEPENDENT_AMBULATORY_CARE_PROVIDER_SITE_OTHER): Payer: BLUE CROSS/BLUE SHIELD | Admitting: Physician Assistant

## 2016-04-15 ENCOUNTER — Encounter: Payer: Self-pay | Admitting: Physician Assistant

## 2016-04-15 VITALS — BP 147/87 | HR 75 | Temp 98.1°F | Ht 70.0 in | Wt 275.0 lb

## 2016-04-15 DIAGNOSIS — K5732 Diverticulitis of large intestine without perforation or abscess without bleeding: Secondary | ICD-10-CM

## 2016-04-15 DIAGNOSIS — K645 Perianal venous thrombosis: Secondary | ICD-10-CM

## 2016-04-15 MED ORDER — METRONIDAZOLE 500 MG PO TABS: 500.0000 mg | ORAL_TABLET | Freq: Two times a day (BID) | ORAL | Status: DC

## 2016-04-15 MED ORDER — SULFAMETHOXAZOLE-TRIMETHOPRIM 800-160 MG PO TABS: 1.0000 | ORAL_TABLET | Freq: Two times a day (BID) | ORAL | Status: DC

## 2016-04-15 NOTE — Patient Instructions (Signed)
Diverticulitis °Diverticulitis is inflammation or infection of small pouches in your colon that form when you have a condition called diverticulosis. The pouches in your colon are called diverticula. Your colon, or large intestine, is where water is absorbed and stool is formed. °Complications of diverticulitis can include: °· Bleeding. °· Severe infection. °· Severe pain. °· Perforation of your colon. °· Obstruction of your colon. °CAUSES  °Diverticulitis is caused by bacteria. °Diverticulitis happens when stool becomes trapped in diverticula. This allows bacteria to grow in the diverticula, which can lead to inflammation and infection. °RISK FACTORS °People with diverticulosis are at risk for diverticulitis. Eating a diet that does not include enough fiber from fruits and vegetables may make diverticulitis more likely to develop. °SYMPTOMS  °Symptoms of diverticulitis may include: °· Abdominal pain and tenderness. The pain is normally located on the left side of the abdomen, but may occur in other areas. °· Fever and chills. °· Bloating. °· Cramping. °· Nausea. °· Vomiting. °· Constipation. °· Diarrhea. °· Blood in your stool. °DIAGNOSIS  °Your health care provider will ask you about your medical history and do a physical exam. You may need to have tests done because many medical conditions can cause the same symptoms as diverticulitis. Tests may include: °· Blood tests. °· Urine tests. °· Imaging tests of the abdomen, including X-rays and CT scans. °When your condition is under control, your health care provider may recommend that you have a colonoscopy. A colonoscopy can show how severe your diverticula are and whether something else is causing your symptoms. °TREATMENT  °Most cases of diverticulitis are mild and can be treated at home. Treatment may include: °· Taking over-the-counter pain medicines. °· Following a clear liquid diet. °· Taking antibiotic medicines by mouth for 7-10 days. °More severe cases may  be treated at a hospital. Treatment may include: °· Not eating or drinking. °· Taking prescription pain medicine. °· Receiving antibiotic medicines through an IV tube. °· Receiving fluids and nutrition through an IV tube. °· Surgery. °HOME CARE INSTRUCTIONS  °· Follow your health care provider's instructions carefully. °· Follow a full liquid diet or other diet as directed by your health care provider. After your symptoms improve, your health care provider may tell you to change your diet. He or she may recommend you eat a high-fiber diet. Fruits and vegetables are good sources of fiber. Fiber makes it easier to pass stool. °· Take fiber supplements or probiotics as directed by your health care provider. °· Only take medicines as directed by your health care provider. °· Keep all your follow-up appointments. °SEEK MEDICAL CARE IF:  °· Your pain does not improve. °· You have a hard time eating food. °· Your bowel movements do not return to normal. °SEEK IMMEDIATE MEDICAL CARE IF:  °· Your pain becomes worse. °· Your symptoms do not get better. °· Your symptoms suddenly get worse. °· You have a fever. °· You have repeated vomiting. °· You have bloody or black, tarry stools. °MAKE SURE YOU:  °· Understand these instructions. °· Will watch your condition. °· Will get help right away if you are not doing well or get worse. °  °This information is not intended to replace advice given to you by your health care provider. Make sure you discuss any questions you have with your health care provider. °  °Document Released: 08/05/2005 Document Revised: 10/31/2013 Document Reviewed: 09/20/2013 °Elsevier Interactive Patient Education ©2016 [employer] ° °

## 2016-04-15 NOTE — Progress Notes (Signed)
Subjective:     Patient ID: Francisco Allen, male   DOB: 05-15-1961, 55 y.o.   MRN: [id number]  HPI Pt with LLQ abd pain Hehas a hx of diverticulitis aqnd sx feel similar He also noted some blood with 2 watery stools this am The color was bright red + hx of hemorrhoids surgically repaired many years ago No rectal pain or pruritus  Review of Systems  Constitutional: Negative.   Gastrointestinal: Positive for vomiting, abdominal pain, diarrhea, blood in stool, anal bleeding and rectal pain. Negative for nausea, constipation and abdominal distention.       Objective:   Physical Exam  Constitutional: He appears well-developed and well-nourished.  Cardiovascular: Normal rate, regular rhythm and normal heart sounds.   Pulmonary/Chest: Effort normal and breath sounds normal.  Abdominal: Soft. Bowel sounds are normal. He exhibits no distension and no mass. There is tenderness. There is no rebound and no guarding.  + TTP of LLQ  Genitourinary:  Rectal exam showed no gross blood + Ext hemorrhoid noted but no active bleeding  Nursing note and vitals reviewed.      Assessment:     Diverticulitis of colon - Plan: metroNIDAZOLE (FLAGYL) 500 MG tablet, sulfamethoxazole-trimethoprim (BACTRIM DS,SEPTRA DS) 800-160 MG tablet  Hemorrhoid thrombosis      Plan:     Rf if Bactrim and Flagyl OTC meds for hemorrhoids Bland diet OTC Tyelnol for pain Work note written F/U prn

## 2016-04-30 ENCOUNTER — Ambulatory Visit (INDEPENDENT_AMBULATORY_CARE_PROVIDER_SITE_OTHER): Payer: BLUE CROSS/BLUE SHIELD | Admitting: Physician Assistant

## 2016-04-30 ENCOUNTER — Encounter: Payer: Self-pay | Admitting: Physician Assistant

## 2016-04-30 VITALS — BP 140/86 | HR 67 | Temp 97.0°F | Ht 70.0 in | Wt 275.4 lb

## 2016-04-30 DIAGNOSIS — R112 Nausea with vomiting, unspecified: Secondary | ICD-10-CM

## 2016-04-30 DIAGNOSIS — R197 Diarrhea, unspecified: Secondary | ICD-10-CM

## 2016-04-30 NOTE — Patient Instructions (Signed)

## 2016-04-30 NOTE — Progress Notes (Signed)
Subjective:     Patient ID: OSEPH BREAKIRON, male   DOB: January 14, 1961, 55 y.o.   MRN: [id number]  HPI Pt with sudden onset of N/V/D last night/early this am Last episode of N/V this am now just with diarrhea No OTC meds for sx Tolerating water  Review of Systems  Constitutional: Positive for activity change, appetite change and fatigue. Negative for fever and chills.  Gastrointestinal: Positive for nausea, vomiting and diarrhea. Negative for abdominal pain, blood in stool and abdominal distention.       Objective:   Physical Exam  Constitutional: He appears well-developed and well-nourished.  HENT:  Mouth/Throat: Oropharynx is clear and moist. No oropharyngeal exudate.  Cardiovascular: Normal rate, regular rhythm and normal heart sounds.   Pulmonary/Chest: Effort normal and breath sounds normal.  Abdominal: Soft. Bowel sounds are normal. He exhibits no distension and no mass. There is no tenderness. There is no rebound and no guarding.  Lymphadenopathy:    He has no cervical adenopathy.  Nursing note and vitals reviewed.      Assessment:     1. Nausea and vomiting, vomiting of unspecified type   2. Diarrhea, unspecified type        Plan:     Fluids-caff/dairy free OTC Tylenol and Imodium for sx Bland diet Note for work F/U prn

## 2016-06-23 ENCOUNTER — Other Ambulatory Visit: Payer: Self-pay | Admitting: Family Medicine

## 2016-06-23 DIAGNOSIS — H40033 Anatomical narrow angle, bilateral: Secondary | ICD-10-CM | POA: Diagnosis not present

## 2016-06-23 DIAGNOSIS — G44219 Episodic tension-type headache, not intractable: Secondary | ICD-10-CM | POA: Diagnosis not present

## 2016-06-26 ENCOUNTER — Telehealth: Payer: Self-pay | Admitting: Family Medicine

## 2016-06-26 ENCOUNTER — Other Ambulatory Visit: Payer: Self-pay | Admitting: Family Medicine

## 2016-06-26 NOTE — Telephone Encounter (Signed)
Called into [facility]

## 2016-06-26 NOTE — Telephone Encounter (Signed)
Patient informed via voicemail that prescription is at [facility] and ready for pick up.  I contacted [facility] and they already had the prescription so there was no need to call in today.

## 2016-06-26 NOTE — Telephone Encounter (Signed)
Go ahead and call this in for him

## 2016-06-29 NOTE — Telephone Encounter (Signed)
dettinger pt - last seen 4/17- hawks is coverage Route to nurse to phone in

## 2016-06-30 NOTE — Telephone Encounter (Signed)
This prescription has already been given to [facility], no need to send this one in also

## 2016-06-30 NOTE — Telephone Encounter (Signed)
Okay; thanks.

## 2016-06-30 NOTE — Telephone Encounter (Signed)
I believe we are to have tried to phone [place] in a couple times but please call and his pharmacy and make sure that this is sent.

## 2016-07-02 DIAGNOSIS — G44219 Episodic tension-type headache, not intractable: Secondary | ICD-10-CM | POA: Diagnosis not present

## 2016-07-04 ENCOUNTER — Other Ambulatory Visit: Payer: Self-pay | Admitting: Nurse Practitioner

## 2016-07-04 DIAGNOSIS — E785 Hyperlipidemia, unspecified: Secondary | ICD-10-CM

## 2016-07-27 ENCOUNTER — Other Ambulatory Visit: Payer: Self-pay | Admitting: Nurse Practitioner

## 2016-07-27 DIAGNOSIS — E785 Hyperlipidemia, unspecified: Secondary | ICD-10-CM

## 2016-08-14 ENCOUNTER — Ambulatory Visit (INDEPENDENT_AMBULATORY_CARE_PROVIDER_SITE_OTHER): Payer: BLUE CROSS/BLUE SHIELD | Admitting: Family Medicine

## 2016-08-14 ENCOUNTER — Encounter: Payer: Self-pay | Admitting: Family Medicine

## 2016-08-14 VITALS — BP 135/89 | HR 80 | Temp 98.2°F | Ht 70.0 in | Wt 267.2 lb

## 2016-08-14 DIAGNOSIS — Z23 Encounter for immunization: Secondary | ICD-10-CM

## 2016-08-14 DIAGNOSIS — Z114 Encounter for screening for human immunodeficiency virus [HIV]: Secondary | ICD-10-CM

## 2016-08-14 DIAGNOSIS — F411 Generalized anxiety disorder: Secondary | ICD-10-CM

## 2016-08-14 DIAGNOSIS — E782 Mixed hyperlipidemia: Secondary | ICD-10-CM

## 2016-08-14 DIAGNOSIS — Z1159 Encounter for screening for other viral diseases: Secondary | ICD-10-CM

## 2016-08-14 NOTE — Progress Notes (Signed)
BP 135/89   Pulse 80   Temp 98.2 F (36.8 C) (Oral)   Ht 5\' 10"  (1.778 m)   Wt 267 lb 4 oz (121.2 kg)   BMI 38.35 kg/m    Subjective:    Patient ID: Francisco Allen, male    DOB: 01-29-1961, 55 y.o.   MRN: [id number]  HPI: Francisco Allen is a 55 y.o. male presenting on 08/14/2016 for Anxiety (pt here today for routine follow up on anxiety and flu vaccine)   HPI Anxiety recheck Patient is coming in today for an anxiety recheck. He says he has been doing better and has been having to use the Xanax less frequently than he was before. He may be used it once every other day now compared to multiple times a day previously. He says he tried the Lexapro for a short period of time but did not like it and stopped it. He denies any suicidal ideations or feelings of hopelessness or helplessness or depression. A lot of his anxiety stems from his family.  Hyperlipidemia recheck Patient is taking Lipitor and omega-3's health is cholesterol. He is not fasting today but will come back fasting to do a recheck. Patient denies headaches, blurred vision, chest pains, shortness of breath, or weakness. Denies any side effects from medication and is content with current medication.   Relevant past medical, surgical, family and social history reviewed and updated as indicated. Interim medical history since our last visit reviewed. Allergies and medications reviewed and updated.  Review of Systems  Constitutional: Negative for chills and fever.  Eyes: Negative for discharge.  Respiratory: Negative for shortness of breath and wheezing.   Cardiovascular: Negative for chest pain and leg swelling.  Musculoskeletal: Negative for back pain and gait problem.  Skin: Negative for rash.  Psychiatric/Behavioral: Positive for sleep disturbance. Negative for decreased concentration, dysphoric mood, self-injury and suicidal ideas. The patient is nervous/anxious.   All other systems reviewed and are negative.   Per  HPI unless specifically indicated above     Medication List       Accurate as of 08/14/16  5:32 PM. Always use your most recent med list.          ALPRAZolam 1 MG tablet Commonly known as:  XANAX TAKE ONE TABLET BY MOUTH THREE TIMES DAILY   aspirin 81 MG tablet Take 81 mg by mouth daily.   atorvastatin 40 MG tablet Commonly known as:  LIPITOR Take 1 tablet (40 mg total) by mouth daily.   cholecalciferol 1000 units tablet Commonly known as:  VITAMIN D Take 1,000 Units by mouth daily.   clobetasol 0.05 % external solution Commonly known as:  TEMOVATE Apply 1 application topically 2 (two) times daily.   MENS 50+ MULTI VITAMIN/MIN PO Take by mouth daily.   omega-3 acid ethyl esters 1 g capsule Commonly known as:  LOVAZA TAKE ONE CAPSULE BY MOUTH TWICE DAILY   vitamin C 500 MG tablet Commonly known as:  ASCORBIC ACID Take 500 mg by mouth daily.          Objective:    BP 135/89   Pulse 80   Temp 98.2 F (36.8 C) (Oral)   Ht 5\' 10"  (1.778 m)   Wt 267 lb 4 oz (121.2 kg)   BMI 38.35 kg/m   Wt Readings from Last 3 Encounters:  08/14/16 267 lb 4 oz (121.2 kg)  04/30/16 275 lb 6.4 oz (124.9 kg)  04/15/16 275 lb (124.7 kg)  Physical Exam  Constitutional: He is oriented to person, place, and time. He appears well-developed and well-nourished. No distress.  Eyes: Conjunctivae are normal. Right eye exhibits no discharge. No scleral icterus.  Neck: Neck supple. No thyromegaly present.  Cardiovascular: Normal rate, regular rhythm, normal heart sounds and intact distal pulses.   No murmur heard. Pulmonary/Chest: Effort normal and breath sounds normal. No respiratory distress. He has no wheezes.  Musculoskeletal: Normal range of motion. He exhibits no edema.  Lymphadenopathy:    He has no cervical adenopathy.  Neurological: He is alert and oriented to person, place, and time. Coordination normal.  Skin: Skin is warm and dry. No rash noted. He is not diaphoretic.    Psychiatric: His behavior is normal. Thought content normal. His mood appears anxious. He does not exhibit a depressed mood. He expresses no suicidal ideation. He expresses no suicidal plans.  Nursing note and vitals reviewed.     Assessment & Plan:   Problem List Items Addressed This Visit      Other   GAD (generalized anxiety disorder) - Primary   Hyperlipidemia   Relevant Orders   Lipid panel    Other Visit Diagnoses    Screening for HIV without presence of risk factors       Relevant Orders   HIV antibody   Need for hepatitis C screening test       Relevant Orders   Hepatitis C antibody   Encounter for immunization       Relevant Orders   Flu Vaccine QUAD 36+ mos IM (Completed)       Follow up plan: No Follow-up on file.  Counseling provided for all of the vaccine components Orders Placed This Encounter  Procedures  . Lipid panel  . HIV antibody  . Hepatitis C antibody    Caryl Pina, MD [facility] 08/14/2016, 5:32 PM

## 2016-08-21 ENCOUNTER — Other Ambulatory Visit: Payer: Self-pay

## 2016-08-21 DIAGNOSIS — Z131 Encounter for screening for diabetes mellitus: Secondary | ICD-10-CM

## 2016-08-21 DIAGNOSIS — E782 Mixed hyperlipidemia: Secondary | ICD-10-CM

## 2016-08-22 ENCOUNTER — Other Ambulatory Visit (INDEPENDENT_AMBULATORY_CARE_PROVIDER_SITE_OTHER): Payer: BLUE CROSS/BLUE SHIELD

## 2016-08-22 DIAGNOSIS — Z114 Encounter for screening for human immunodeficiency virus [HIV]: Secondary | ICD-10-CM

## 2016-08-22 DIAGNOSIS — Z1159 Encounter for screening for other viral diseases: Secondary | ICD-10-CM | POA: Diagnosis not present

## 2016-08-22 DIAGNOSIS — Z131 Encounter for screening for diabetes mellitus: Secondary | ICD-10-CM

## 2016-08-22 DIAGNOSIS — E782 Mixed hyperlipidemia: Secondary | ICD-10-CM | POA: Diagnosis not present

## 2016-08-24 LAB — BAYER DCA HB A1C WAIVED: HB A1C (BAYER DCA - WAIVED): 5.8 % (ref <7.0)

## 2016-09-01 LAB — LIPID PANEL
CHOLESTEROL TOTAL: 116 mg/dL (ref 100–199)
Chol/HDL Ratio: 4.6 ratio units (ref 0.0–5.0)
HDL: 25 mg/dL — ABNORMAL LOW (ref >39)
LDL Calculated: 57 mg/dL (ref 0–99)
Triglycerides: 171 mg/dL — ABNORMAL HIGH (ref 0–149)
VLDL Cholesterol Cal: 34 mg/dL (ref 5–40)

## 2016-09-01 LAB — CMP14+EGFR
ALT: 39 IU/L (ref 0–44)
AST: 34 IU/L (ref 0–40)
Albumin/Globulin Ratio: 1.9 (ref 1.2–2.2)
Albumin: 4.6 g/dL (ref 3.5–5.5)
Alkaline Phosphatase: 79 IU/L (ref 39–117)
BUN/Creatinine Ratio: 20 (ref 9–20)
BUN: 17 mg/dL (ref 6–24)
Bilirubin Total: 1 mg/dL (ref 0.0–1.2)
CALCIUM: 9.5 mg/dL (ref 8.7–10.2)
CHLORIDE: 100 mmol/L (ref 96–106)
CO2: 24 mmol/L (ref 18–29)
Creatinine, Ser: 0.84 mg/dL (ref 0.76–1.27)
GFR, EST AFRICAN AMERICAN: 112 mL/min/{1.73_m2} (ref >59)
GFR, EST NON AFRICAN AMERICAN: 97 mL/min/{1.73_m2} (ref >59)
GLUCOSE: 109 mg/dL — AB (ref 65–99)
Globulin, Total: 2.4 g/dL (ref 1.5–4.5)
Potassium: 4.6 mmol/L (ref 3.5–5.2)
Sodium: 141 mmol/L (ref 134–144)
TOTAL PROTEIN: 7 g/dL (ref 6.0–8.5)

## 2016-09-01 LAB — HEPATITIS C ANTIBODY

## 2016-09-01 LAB — PLEASE NOTE

## 2016-09-01 LAB — HIV ANTIBODY (ROUTINE TESTING W REFLEX): HIV SCREEN 4TH GENERATION: NONREACTIVE

## 2016-09-02 ENCOUNTER — Encounter: Payer: Self-pay | Admitting: Family Medicine

## 2016-09-02 ENCOUNTER — Other Ambulatory Visit: Payer: Self-pay | Admitting: Family

## 2016-09-02 ENCOUNTER — Ambulatory Visit (INDEPENDENT_AMBULATORY_CARE_PROVIDER_SITE_OTHER): Payer: BLUE CROSS/BLUE SHIELD | Admitting: Family Medicine

## 2016-09-02 DIAGNOSIS — K5732 Diverticulitis of large intestine without perforation or abscess without bleeding: Secondary | ICD-10-CM | POA: Diagnosis not present

## 2016-09-02 MED ORDER — METRONIDAZOLE 500 MG PO TABS: 500.0000 mg | ORAL_TABLET | Freq: Two times a day (BID) | ORAL | 1 refills | Status: DC

## 2016-09-02 MED ORDER — SULFAMETHOXAZOLE-TRIMETHOPRIM 800-160 MG PO TABS: 1.0000 | ORAL_TABLET | Freq: Two times a day (BID) | ORAL | 0 refills | Status: DC

## 2016-09-02 NOTE — Progress Notes (Signed)
Subjective:  Patient ID: Francisco Allen, male    DOB: 07-20-1961  Age: 55 y.o. MRN: [id number]  CC: Abdominal Pain (pt here today c/o diarrhea and he has a hx of diverticulitis and is having a flare up. He would like a note for work until it calms down.)   HPI [name] presents for Symptoms noted above. Moderately severe. Onset 3 days ago. Similar to previous episodes. Pain primarily left lower quadrant. Not passing any blood. Does have some loose bowel movements.   History [name] has a past medical history of Colon polyp; Diverticulitis; and Hyperlipidemia.   He has a past surgical history that includes Colonoscopy; Cyst excision (2004); and Shoulder surgery (Right).   His family history includes Colon cancer in his brother; Heart disease in his brother and brother.He reports that he has been smoking Cigarettes.  He has a 28.00 pack-year smoking history. He has never used smokeless tobacco. He reports that he does not drink alcohol or use drugs.    ROS Review of Systems  Constitutional: Negative for chills, diaphoresis, fever and unexpected weight change.  HENT: Negative for rhinorrhea and trouble swallowing.   Respiratory: Negative for cough, chest tightness and shortness of breath.   Cardiovascular: Negative for chest pain.  Gastrointestinal: Positive for abdominal pain. Negative for abdominal distention, blood in stool, constipation, diarrhea, nausea, rectal pain and vomiting.  Genitourinary: Negative for dysuria, flank pain and hematuria.  Musculoskeletal: Negative for arthralgias and joint swelling.  Skin: Negative for rash.  Neurological: Negative for syncope and headaches.    Objective:  BP 135/88   Pulse 79   Ht 5\' 10"  (1.778 m)   Wt 272 lb 8 oz (123.6 kg)   BMI 39.10 kg/m   BP Readings from Last 3 Encounters:  09/02/16 135/88  08/14/16 135/89  04/30/16 140/86    Wt Readings from Last 3 Encounters:  09/02/16 272 lb 8 oz (123.6 kg)  08/14/16 267 lb 4  oz (121.2 kg)  04/30/16 275 lb 6.4 oz (124.9 kg)     Physical Exam  Constitutional: He appears well-developed and well-nourished.  HENT:  Head: Normocephalic and atraumatic.  Right Ear: Tympanic membrane and external ear normal. No decreased hearing is noted.  Left Ear: Tympanic membrane and external ear normal. No decreased hearing is noted.  Mouth/Throat: No oropharyngeal exudate or posterior oropharyngeal erythema.  Eyes: Pupils are equal, round, and reactive to light.  Neck: Normal range of motion. Neck supple.  Cardiovascular: Normal rate and regular rhythm.   No murmur heard. Pulmonary/Chest: Breath sounds normal. No respiratory distress.  Abdominal: Soft. Bowel sounds are normal. He exhibits distension. He exhibits no mass. There is tenderness.  Vitals reviewed.    Lab Results  Component Value Date   WBC 7.3 07/24/2014   HGB 16.7 07/24/2014   HCT 48.2 07/24/2014   GLUCOSE 109 (H) 08/22/2016   CHOL 116 08/22/2016   TRIG 171 (H) 08/22/2016   HDL 25 (L) 08/22/2016   LDLCALC 57 08/22/2016   ALT 39 08/22/2016   AST 34 08/22/2016   NA 141 08/22/2016   K 4.6 08/22/2016   CL 100 08/22/2016   CREATININE 0.84 08/22/2016   BUN 17 08/22/2016   CO2 24 08/22/2016   PSA 0.8 10/20/2014   HGBA1C 5.7 08/21/2015    US Abdomen Complete  Result Date: 02/02/2011 *RADIOLOGY REPORT* Clinical Data:  Chronically elevated bilirubin. COMPLETE ABDOMINAL ULTRASOUND Comparison:  None. Findings: Gallbladder:  No gallstones, gallbladder wall thickening, or pericholecystic fluid.  Common bile duct:  Measures 22.0 cm. Liver:  Demonstrates increased echogenicity consistent with fatty change.  No intrahepatic biliary ductal dilatation or focal lesion. Note is made of focal fatty sparing along the gallbladder. The liver is enlarged measuring approximately 22.4 cm. IVC:  Appears normal. Pancreas:  No focal abnormality seen. Spleen:  Measures 10.0 cm and appears normal. Right Kidney:  Measures 12.8 cm.   A cyst is identified measuring 3.2 cm in mid pole.  No stones or hydronephrosis. Left Kidney:  Measures 14.0 cm and appears normal. Abdominal aorta:  No aneurysm identified. IMPRESSION: Hepatomegaly and fatty infiltration of the liver.  Negative for biliary ductal dilatation.  The examination is otherwise unremarkable. Original Report Authenticated By: [id number]  Mr Shoulder Left Wo Contrast  Result Date: 02/02/2011 *RADIOLOGY REPORT* Clinical Data: Left shoulder pain.  None none injury. MRI OF THE LEFT SHOULDER WITHOUT CONTRAST Technique:  Multiplanar, multisequence MR imaging was performed. No intravenous contrast was administered. Comparison: None Findings: No acute bony findings.  There are glenohumeral joint degenerative changes with focal subchondral cystic change involving the inferior glenoid.  The [facility] joint demonstrates mild degenerative changes.  No lateral downsloping or undersurface spurring from the type 2 acromion. There is moderate rotator cuff tendinopathy/tendinosis. A small intrasubstance tears are noted.  Bursal and articular surface irregularity is likely fraying and fibrillation.  No discrete retracted full-thickness rotator cuff tear. The long head biceps tendon is intact.  Small anterior and posterior labral tears are suspected.  The superior labrum is intact.  There is thickening of the capsular structures in the axillary recess which can be seen with adhesive capsulitis or synovitis.  There is mild to moderate subacromial/subdeltoid bursitis. IMPRESSION: 1.  Glenohumeral joint degenerative changes with subchondral cystic change involving the inferior glenoid. 2.  No significant findings for bony impingement. 3.  Moderate rotator cuff tendinopathy/tendinosis.  No discrete partial or full-thickness rotator cuff tear. 4.  Anterior and posterior labral tears are suspected. 5.  Mild to moderate subacromial/subdeltoid bursitis. 6.  There is thickening of the capsular structures in the axillary  recess which can be seen with adhesive capsulitis or synovitis. Original Report Authenticated By: P. Kalman Jewels, M.D.   Assessment & Plan:   Cohan was seen today for abdominal pain.  Diagnoses and all orders for this visit:  Diverticulitis of colon Comments: Patient has had previous diverticulitis, symptoms that are very consistent with diverticulitis. Just started today so we will try oral abx OP, if worsens get CT Orders: -     metroNIDAZOLE (FLAGYL) 500 MG tablet; Take 1 tablet (500 mg total) by mouth 2 (two) times daily.  Other orders -     sulfamethoxazole-trimethoprim (BACTRIM DS,SEPTRA DS) 800-160 MG tablet; Take 1 tablet by mouth 2 (two) times daily.    I am having Mr. Jewitt start on sulfamethoxazole-trimethoprim. I am also having him maintain his aspirin, Multiple Vitamins-Minerals (MENS 50+ MULTI VITAMIN/MIN PO), cholecalciferol, atorvastatin, vitamin C, ALPRAZolam, omega-3 acid ethyl esters, clobetasol, and metroNIDAZOLE.  Meds ordered this encounter  Medications  . metroNIDAZOLE (FLAGYL) 500 MG tablet    Sig: Take 1 tablet (500 mg total) by mouth 2 (two) times daily.    Dispense:  20 tablet    Refill:  1  . sulfamethoxazole-trimethoprim (BACTRIM DS,SEPTRA DS) 800-160 MG tablet    Sig: Take 1 tablet by mouth 2 (two) times daily.    Dispense:  20 tablet    Refill:  0     Follow-up: Return if symptoms worsen  or fail to improve.  Claretta Fraise, M.D.

## 2016-10-07 ENCOUNTER — Ambulatory Visit (INDEPENDENT_AMBULATORY_CARE_PROVIDER_SITE_OTHER): Payer: BLUE CROSS/BLUE SHIELD | Admitting: Family Medicine

## 2016-10-07 ENCOUNTER — Encounter: Payer: Self-pay | Admitting: Family Medicine

## 2016-10-07 VITALS — BP 132/86 | HR 73 | Temp 97.0°F | Ht 70.0 in | Wt 274.2 lb

## 2016-10-07 DIAGNOSIS — K5732 Diverticulitis of large intestine without perforation or abscess without bleeding: Secondary | ICD-10-CM

## 2016-10-07 MED ORDER — METRONIDAZOLE 500 MG PO TABS: 500.0000 mg | ORAL_TABLET | Freq: Two times a day (BID) | ORAL | 1 refills | Status: DC

## 2016-10-07 MED ORDER — CLOBETASOL PROPIONATE 0.05 % EX SOLN: 1.0000 "application " | Freq: Two times a day (BID) | CUTANEOUS | 1 refills | Status: DC

## 2016-10-07 MED ORDER — ALPRAZOLAM 1 MG PO TABS: 1.0000 mg | ORAL_TABLET | Freq: Three times a day (TID) | ORAL | 1 refills | Status: DC

## 2016-10-07 MED ORDER — SULFAMETHOXAZOLE-TRIMETHOPRIM 800-160 MG PO TABS: 1.0000 | ORAL_TABLET | Freq: Two times a day (BID) | ORAL | 0 refills | Status: DC

## 2016-10-07 NOTE — Progress Notes (Signed)
BP 132/86   Pulse 73   Temp 97 F (36.1 C) (Oral)   Ht 5\' 10"  (1.778 m)   Wt 274 lb 4 oz (124.4 kg)   BMI 39.35 kg/m    Subjective:    Patient ID: [name], male    DOB: January 04, 1961, 55 y.o.   MRN: [id number]  HPI: Francisco Allen is a 55 y.o. male presenting on 10/07/2016 for Rectal Bleeding (beginning this morning); Abdominal Pain; and Diarrhea   HPI Abdominal pain and rectal bleeding and diarrhea Patient has been having left lower quadrant abdominal pain and diarrhea and started having rectal bleeding that just began early this morning. This usually starts up when he gets diverticulitis. He has gotten diverticulitis very frequently over the past year. He denies any fevers or chills but does have some nausea associated with it. He denies any vomiting. The blood in his stool is been bright red. He denies any chest pain or lightheadedness or dizziness.  Relevant past medical, surgical, family and social history reviewed and updated as indicated. Interim medical history since our last visit reviewed. Allergies and medications reviewed and updated.  Review of Systems  Constitutional: Negative for chills and fever.  Respiratory: Negative for shortness of breath and wheezing.   Cardiovascular: Negative for chest pain and leg swelling.  Gastrointestinal: Positive for abdominal distention, abdominal pain, blood in stool, diarrhea and nausea. Negative for vomiting.  Musculoskeletal: Negative for back pain and gait problem.  Skin: Negative for rash.  All other systems reviewed and are negative.   Per HPI unless specifically indicated above     Medication List       Accurate as of 10/07/16 12:15 PM. Always use your most recent med list.          ALPRAZolam 1 MG tablet Commonly known as:  XANAX TAKE ONE TABLET BY MOUTH THREE TIMES DAILY   aspirin 81 MG tablet Take 81 mg by mouth daily.   atorvastatin 40 MG tablet Commonly known as:  LIPITOR Take 1 tablet (40 mg  total) by mouth daily.   cholecalciferol 1000 units tablet Commonly known as:  VITAMIN D Take 1,000 Units by mouth daily.   clobetasol 0.05 % external solution Commonly known as:  TEMOVATE Apply 1 application topically 2 (two) times daily.   MENS 50+ MULTI VITAMIN/MIN PO Take by mouth daily.   metroNIDAZOLE 500 MG tablet Commonly known as:  FLAGYL Take 1 tablet (500 mg total) by mouth 2 (two) times daily.   omega-3 acid ethyl esters 1 g capsule Commonly known as:  LOVAZA TAKE ONE CAPSULE BY MOUTH TWICE DAILY   sulfamethoxazole-trimethoprim 800-160 MG tablet Commonly known as:  BACTRIM DS,SEPTRA DS Take 1 tablet by mouth 2 (two) times daily.   vitamin C 500 MG tablet Commonly known as:  ASCORBIC ACID Take 500 mg by mouth daily.          Objective:    BP 132/86   Pulse 73   Temp 97 F (36.1 C) (Oral)   Ht 5\' 10"  (1.778 m)   Wt 274 lb 4 oz (124.4 kg)   BMI 39.35 kg/m   Wt Readings from Last 3 Encounters:  10/07/16 274 lb 4 oz (124.4 kg)  09/02/16 272 lb 8 oz (123.6 kg)  08/14/16 267 lb 4 oz (121.2 kg)    Physical Exam  Constitutional: He is oriented to person, place, and time. He appears well-developed and well-nourished. No distress.  Eyes: Conjunctivae are normal. Right  eye exhibits no discharge. Left eye exhibits no discharge. No scleral icterus.  Cardiovascular: Normal rate, regular rhythm, normal heart sounds and intact distal pulses.   No murmur heard. Pulmonary/Chest: Effort normal and breath sounds normal. No respiratory distress. He has no wheezes. He has no rales.  Abdominal: Soft. Bowel sounds are normal. He exhibits no distension. There is tenderness in the left lower quadrant. There is no rigidity, no rebound, no guarding and no CVA tenderness.  Musculoskeletal: Normal range of motion. He exhibits no edema.  Neurological: He is alert and oriented to person, place, and time. Coordination normal.  Skin: Skin is warm and dry. No rash noted. He is not  diaphoretic.  Psychiatric: He has a normal mood and affect. His behavior is normal.  Nursing note and vitals reviewed.     Assessment & Plan:   Problem List Items Addressed This Visit      Digestive   Diverticulitis of colon - Primary   Relevant Medications   metroNIDAZOLE (FLAGYL) 500 MG tablet   sulfamethoxazole-trimethoprim (BACTRIM DS,SEPTRA DS) 800-160 MG tablet   Other Relevant Orders   Ambulatory referral to Gastroenterology       Follow up plan: Return if symptoms worsen or fail to improve.  Counseling provided for all of the vaccine components Orders Placed This Encounter  Procedures  . Ambulatory referral to Gastroenterology    Caryl Pina, MD [facility] 10/07/2016, 12:15 PM

## 2016-11-20 ENCOUNTER — Ambulatory Visit: Payer: Self-pay | Admitting: Internal Medicine

## 2016-12-15 DIAGNOSIS — I10 Essential (primary) hypertension: Secondary | ICD-10-CM | POA: Diagnosis not present

## 2016-12-21 ENCOUNTER — Other Ambulatory Visit: Payer: Self-pay | Admitting: Family Medicine

## 2016-12-21 NOTE — Telephone Encounter (Signed)
Send him 1 month's worth but then he needs to come in for an appointment. He has been dealing with a lot with the loss of his brother and I want to see him to make sure he is doing okay. May call in the 1 month's refill.

## 2016-12-21 NOTE — Telephone Encounter (Signed)
Last seen oct and Nov of 2017. dettinger If approved - send to nurse to phone in

## 2016-12-22 ENCOUNTER — Ambulatory Visit (INDEPENDENT_AMBULATORY_CARE_PROVIDER_SITE_OTHER): Payer: BLUE CROSS/BLUE SHIELD | Admitting: Internal Medicine

## 2016-12-22 ENCOUNTER — Encounter: Payer: Self-pay | Admitting: Internal Medicine

## 2016-12-22 VITALS — BP 136/74 | HR 88 | Ht 70.0 in | Wt 271.0 lb

## 2016-12-22 DIAGNOSIS — K625 Hemorrhage of anus and rectum: Secondary | ICD-10-CM

## 2016-12-22 DIAGNOSIS — K5732 Diverticulitis of large intestine without perforation or abscess without bleeding: Secondary | ICD-10-CM

## 2016-12-22 DIAGNOSIS — Z86010 Personal history of colonic polyps: Secondary | ICD-10-CM

## 2016-12-22 DIAGNOSIS — F419 Anxiety disorder, unspecified: Secondary | ICD-10-CM | POA: Diagnosis not present

## 2016-12-22 MED ORDER — NA SULFATE-K SULFATE-MG SULF 17.5-3.13-1.6 GM/177ML PO SOLN: 1.0000 | Freq: Once | ORAL | 0 refills | Status: AC

## 2016-12-22 NOTE — Telephone Encounter (Signed)
Aware, refill done on xanax and needs to be seen by provider.

## 2016-12-22 NOTE — Progress Notes (Signed)
HISTORY OF PRESENT ILLNESS:  Francisco Allen is a 56 y.o. male with family history of colon cancer and a personal history of adenomatous colon polyps who is referred by his primary care provider Caryl Pina, M.D. regarding recurrent diverticulitis. Patient has not been seen in this office in 4 years. He underwent index colonoscopy in 2007 with hyperplastic polyp. Most recent colonoscopy February 2014 with moderate left-sided diverticulosis and diminutive transverse colon polyp which was removed and found to be tubular adenoma. Routine follow-up in 5 years recommended. Patient reports to me that over the past 2 years he has had recurrent bouts of diverticulitis as manifested by focal left lower quadrant pain requiring antibiotic treatment. Approximately 2 or 3 bouts per year. Typically improves within several days. He's also had intermittent problems with constipation and loose stools which is new. Finally, he mentions some intermittent rectal bleeding which has him concerned. He is known to have internal hemorrhoids on his most recent exam. Patient has had 2 brothers passed away within the past few years. Most recently last month. Review of outside blood work from October 2017 reveals unremarkable comprehensive metabolic panel. I do not see a CBC. No new medications.  REVIEW OF SYSTEMS:  All non-GI ROS negative except for anxiety, fatigue, sleeping problems  Past Medical History:  Diagnosis Date  . Colon polyp   . Diverticulitis   . Hyperlipidemia     Past Surgical History:  Procedure Laterality Date  . COLONOSCOPY    . CYST EXCISION  2004   Roof of mouth  . SHOULDER SURGERY Right     Social History Francisco Allen  reports that he has been smoking Cigarettes.  He has a 28.00 pack-year smoking history. He has never used smokeless tobacco. He reports that he does not drink alcohol or use drugs.  family history includes Colon cancer in his brother; Heart disease in his brother and  brother.  Allergies  Allergen Reactions  . Bee Venom Other (See Comments)    Anaphylactic shock  . Penicillins Swelling    Airway closes up       PHYSICAL EXAMINATION: Vital signs: BP 136/74   Pulse 88 Comment: irregular  Ht 5\' 10"  (1.778 m)   Wt 271 lb (122.9 kg)   BMI 38.88 kg/m   Constitutional:Obese but otherwise generally well-appearing, no acute distress, But anxious Psychiatric: alert and oriented x3, cooperative Eyes: extraocular movements intact, anicteric, conjunctiva pink Mouth: oral pharynx moist, no lesions Neck: supple no lymphadenopathy Cardiovascular: heart regular rate and rhythm, no murmur Lungs: clear to auscultation bilaterally Abdomen: soft, obese, nontender, nondistended, no obvious ascites, no peritoneal signs, normal bowel sounds, no organomegaly Rectal: Deferred until colonoscopy Extremities: no clubbing cyanosis or lower extremity edema bilaterally Skin: no lesions on visible extremities Neuro: No focal deficits. No asterixis.  ASSESSMENT:  #1. Recurrent diverticulitis #2. Alternating bowel habits #3. Rectal bleeding. Suspect hemorrhoids #4. Personal history of adenomatous colon polyps #5. Family history of colon cancer   PLAN:  #1. Discussion on diverticular disease #2. Daily fiber supplementation with Metamucil #3. Schedule colonoscopy for polyp surveillance and to evaluate rectal bleeding.The nature of the procedure, as well as the risks, benefits, and alternatives were carefully and thoroughly reviewed with the patient. Ample time for discussion and questions allowed. The patient understood, was satisfied, and agreed to proceed.

## 2017-02-09 ENCOUNTER — Encounter: Payer: Self-pay | Admitting: Family Medicine

## 2017-02-09 ENCOUNTER — Ambulatory Visit (INDEPENDENT_AMBULATORY_CARE_PROVIDER_SITE_OTHER): Payer: BLUE CROSS/BLUE SHIELD | Admitting: Family Medicine

## 2017-02-09 VITALS — BP 136/85 | HR 93 | Temp 97.0°F | Ht 70.0 in | Wt 267.0 lb

## 2017-02-09 DIAGNOSIS — E1129 Type 2 diabetes mellitus with other diabetic kidney complication: Secondary | ICD-10-CM | POA: Insufficient documentation

## 2017-02-09 DIAGNOSIS — R5383 Other fatigue: Secondary | ICD-10-CM

## 2017-02-09 DIAGNOSIS — R7303 Prediabetes: Secondary | ICD-10-CM | POA: Diagnosis not present

## 2017-02-09 DIAGNOSIS — E782 Mixed hyperlipidemia: Secondary | ICD-10-CM

## 2017-02-09 DIAGNOSIS — M259 Joint disorder, unspecified: Secondary | ICD-10-CM

## 2017-02-09 DIAGNOSIS — R809 Proteinuria, unspecified: Secondary | ICD-10-CM

## 2017-02-09 DIAGNOSIS — F411 Generalized anxiety disorder: Secondary | ICD-10-CM

## 2017-02-09 MED ORDER — DULOXETINE HCL 30 MG PO CPEP: 30.0000 mg | ORAL_CAPSULE | Freq: Every day | ORAL | 3 refills | Status: DC

## 2017-02-09 MED ORDER — ALPRAZOLAM 1 MG PO TABS: 1.0000 mg | ORAL_TABLET | Freq: Three times a day (TID) | ORAL | 2 refills | Status: DC

## 2017-02-09 NOTE — Progress Notes (Signed)
BP 136/85   Pulse 93   Temp 97 F (36.1 C) (Oral)   Ht _0  (1.778 m)   Wt 267 lb (121.1 kg)   BMI 38.31 kg/m    Subjective:    Patient ID: Francisco Allen, male    DOB: Jan 28, 1961, 56 y.o.   MRN: [id number]  HPI: Francisco Allen is a 56 y.o. male presenting on 02/09/2017 for Annual Exam (fatigue, insomnia-able to go to sleep but unable to stay asleep, bilateral hand pain)   HPI Hyperlipidemia Patient is coming in for recheck of his hyperlipidemia. He is currently taking Lipitor. He denies any issues with myalgias or history of liver damage from it. He denies any focal numbness or weakness or chest pain.   Prediabetes Patient comes in today for recheck of his diabetes. Patient has been currently taking diet controlled and has been diagnosed with prediabetes. Patient is not currently on an ACE inhibitor. Patient has not seen an ophthalmologist this year. Patient denies any issues with his feet. He is coming in for recheck today  Anxiety and fatigue and multiple joint complaints Patient comes in for recheck of anxiety and fatigue and he is been having multiple joint complaints. He has been having some issues sleeping at night because he wakes up. He wakes up at night not because he has to urinate but more because he has been feeling anxious and his mind is racing. He has been dealing with a lot since his brother past who was the last family member that he had on his side of the family. He is still married to his wife and has a loving relationship and she is very supportive but it was the last family member on his side of the family. He has been struggling a lot with that and feeling lonely in a way and has been taking more of his Xanax that he usually takes because of it. He is also been having multiple joint issues that are migrating and sometimes his knees and sometimes his ankles and sometimes his arms and associated a lot when he gets increased stress. He also has been feeling tired and  having lack of energy since he's been feeling worse and because he has not been sleeping at night. He does admit that he snores at night but does not think that he has sleep apnea  Relevant past medical, surgical, family and social history reviewed and updated as indicated. Interim medical history since our last visit reviewed. Allergies and medications reviewed and updated.  Review of Systems  Constitutional: Positive for fatigue. Negative for chills and fever.  Eyes: Negative for discharge.  Respiratory: Negative for shortness of breath and wheezing.   Cardiovascular: Negative for chest pain and leg swelling.  Musculoskeletal: Negative for back pain and gait problem.  Skin: Negative for rash.  Neurological: Negative for dizziness, weakness, light-headedness and headaches.  Psychiatric/Behavioral: Positive for sleep disturbance.  All other systems reviewed and are negative.   Per HPI unless specifically indicated above     Objective:    BP 136/85   Pulse 93   Temp 97 F (36.1 C) (Oral)   Ht _1  (1.778 m)   Wt 267 lb (121.1 kg)   BMI 38.31 kg/m   Wt Readings from Last 3 Encounters:  02/09/17 267 lb (121.1 kg)  12/22/16 271 lb (122.9 kg)  10/07/16 274 lb 4 oz (124.4 kg)    Physical Exam  Constitutional: He is oriented to person, place, and  time. He appears well-developed and well-nourished. No distress.  Eyes: Conjunctivae are normal. No scleral icterus.  Cardiovascular: Normal rate, regular rhythm, normal heart sounds and intact distal pulses.   No murmur heard. Pulmonary/Chest: Effort normal and breath sounds normal. No respiratory distress. He has no wheezes. He has no rales.  Musculoskeletal: Normal range of motion. He exhibits no edema or tenderness (No joint tenderness on exam today.).  Neurological: He is alert and oriented to person, place, and time. Coordination normal.  Skin: Skin is warm and dry. No rash noted. He is not diaphoretic.  Psychiatric: His behavior  is normal. Judgment normal. His mood appears anxious. He does not exhibit a depressed mood. He expresses no suicidal ideation. He expresses no suicidal plans.  Nursing note and vitals reviewed.     Assessment & Plan:   Problem List Items Addressed This Visit      Other   GAD (generalized anxiety disorder)   Relevant Medications   DULoxetine (CYMBALTA) 30 MG capsule   ALPRAZolam (XANAX) 1 MG tablet   Other Relevant Orders   CBC with Differential/Platelet   TSH   VITAMIN D 25 Hydroxy (Vit-D Deficiency, Fractures)   Hyperlipidemia - Primary   Relevant Orders   Lipid panel   Prediabetes   Relevant Orders   CMP14+EGFR   Bayer DCA Hb A1c Waived    Other Visit Diagnoses    Other fatigue       Based on history and physical, could be related to either sleep apnea or anxiety and depression. Will add Cymbalta and consider sleep study in the future.    Relevant Medications   DULoxetine (CYMBALTA) 30 MG capsule   Multiple joint complaints       Especially in hands and wrists and has swelling intermittently.   Relevant Orders   Arthritis Panel   Alpha-Gal Panel   Lyme Ab/Western Blot Reflex   Rocky mtn spotted fvr abs pnl(IgG+IgM)   Sedimentation rate   High sensitivity CRP   ANA Comprehensive Panel       Follow up plan: Return in about 3 months (around 05/11/2017), or if symptoms worsen or fail to improve, for Recheck anxiety.  Counseling provided for all of the vaccine components Orders Placed This Encounter  Procedures  . CMP14+EGFR  . CBC with Differential/Platelet  . Lipid panel  . TSH  . VITAMIN D 25 Hydroxy (Vit-D Deficiency, Fractures)  . Bayer DCA Hb A1c Waived  . Arthritis Panel  . Alpha-Gal Panel  . Lyme Ab/Western Blot Reflex  . Rocky mtn spotted fvr abs pnl(IgG+IgM)  . Sedimentation rate  . High sensitivity CRP  . [place] Haydn Cush, MD [facility] 02/09/2017, 4:28 PM

## 2017-02-12 DIAGNOSIS — Z1211 Encounter for screening for malignant neoplasm of colon: Secondary | ICD-10-CM | POA: Diagnosis not present

## 2017-02-12 DIAGNOSIS — Z125 Encounter for screening for malignant neoplasm of prostate: Secondary | ICD-10-CM | POA: Diagnosis not present

## 2017-02-12 DIAGNOSIS — Z0000 Encounter for general adult medical examination without abnormal findings: Secondary | ICD-10-CM | POA: Diagnosis not present

## 2017-02-12 DIAGNOSIS — Z1159 Encounter for screening for other viral diseases: Secondary | ICD-10-CM | POA: Diagnosis not present

## 2017-02-13 ENCOUNTER — Other Ambulatory Visit: Payer: BLUE CROSS/BLUE SHIELD

## 2017-02-13 DIAGNOSIS — M259 Joint disorder, unspecified: Secondary | ICD-10-CM

## 2017-02-13 DIAGNOSIS — R7303 Prediabetes: Secondary | ICD-10-CM

## 2017-02-13 DIAGNOSIS — F411 Generalized anxiety disorder: Secondary | ICD-10-CM | POA: Diagnosis not present

## 2017-02-13 DIAGNOSIS — E782 Mixed hyperlipidemia: Secondary | ICD-10-CM

## 2017-02-15 LAB — SPECIMEN STATUS REPORT

## 2017-02-15 LAB — BAYER DCA HB A1C WAIVED: HB A1C: 5.9 % (ref <7.0)

## 2017-02-17 LAB — CMP14+EGFR
ALBUMIN: 4.3 g/dL (ref 3.5–5.5)
ALT: 32 IU/L (ref 0–44)
AST: 23 IU/L (ref 0–40)
Albumin/Globulin Ratio: 1.7 (ref 1.2–2.2)
Alkaline Phosphatase: 72 IU/L (ref 39–117)
BUN / CREAT RATIO: 13 (ref 9–20)
BUN: 12 mg/dL (ref 6–24)
Bilirubin Total: 0.8 mg/dL (ref 0.0–1.2)
CO2: 27 mmol/L (ref 18–29)
Calcium: 9.6 mg/dL (ref 8.7–10.2)
Chloride: 98 mmol/L (ref 96–106)
Creatinine, Ser: 0.91 mg/dL (ref 0.76–1.27)
GFR, EST AFRICAN AMERICAN: 107 mL/min/{1.73_m2} (ref >59)
GFR, EST NON AFRICAN AMERICAN: 93 mL/min/{1.73_m2} (ref >59)
GLUCOSE: 119 mg/dL — AB (ref 65–99)
Globulin, Total: 2.5 g/dL (ref 1.5–4.5)
Potassium: 4.9 mmol/L (ref 3.5–5.2)
Sodium: 139 mmol/L (ref 134–144)
TOTAL PROTEIN: 6.8 g/dL (ref 6.0–8.5)

## 2017-02-17 LAB — ARTHRITIS PANEL
BASOS ABS: 0.1 10*3/uL (ref 0.0–0.2)
Basos: 1 %
EOS (ABSOLUTE): 0.1 10*3/uL (ref 0.0–0.4)
Eos: 2 %
HEMATOCRIT: 51.6 % — AB (ref 37.5–51.0)
HEMOGLOBIN: 17.9 g/dL — AB (ref 13.0–17.7)
Immature Grans (Abs): 0.1 10*3/uL (ref 0.0–0.1)
Immature Granulocytes: 1 %
LYMPHS ABS: 1.9 10*3/uL (ref 0.7–3.1)
Lymphs: 30 %
MCH: 30.7 pg (ref 26.6–33.0)
MCHC: 34.7 g/dL (ref 31.5–35.7)
MCV: 89 fL (ref 79–97)
MONOS ABS: 0.5 10*3/uL (ref 0.1–0.9)
Monocytes: 8 %
NEUTROS ABS: 3.8 10*3/uL (ref 1.4–7.0)
Neutrophils: 58 %
PLATELETS: 175 10*3/uL (ref 150–379)
RBC: 5.83 x10E6/uL — ABNORMAL HIGH (ref 4.14–5.80)
RDW: 14 % (ref 12.3–15.4)
Rhuematoid fact SerPl-aCnc: <10 IU/mL (ref 0.0–13.9)
Sed Rate: 2 mm/hr (ref 0–30)
Uric Acid: 6.2 mg/dL (ref 3.7–8.6)
WBC: 6.5 10*3/uL (ref 3.4–10.8)

## 2017-02-17 LAB — LYME AB/WESTERN BLOT REFLEX
LYME DISEASE AB, QUANT, IGM: <0.8 index (ref 0.00–0.79)
Lyme IgG/IgM Ab: <0.91 {ISR} (ref 0.00–0.90)

## 2017-02-17 LAB — ALPHA-GAL PANEL
Alpha Gal IgE*: 0.28 kU/L (ref <0.35)
Class Interpretation: 0
Class Interpretation: 0
PORK CLASS INTERPRETATION: 0
Pork (Sus spp) IgE: <0.1 kU/L (ref <0.35)

## 2017-02-17 LAB — ROCKY MTN SPOTTED FVR ABS PNL(IGG+IGM)
RMSF IGG: NEGATIVE
RMSF IgM: 0.31 index (ref 0.00–0.89)

## 2017-02-17 LAB — ANA COMPREHENSIVE PANEL
Centromere Ab Screen: <0.2 AI (ref 0.0–0.9)
Chromatin Ab SerPl-aCnc: <0.2 AI (ref 0.0–0.9)
DSDNA AB: 2 [IU]/mL (ref 0–9)
ENA RNP Ab: <0.2 AI (ref 0.0–0.9)
ENA SM Ab Ser-aCnc: <0.2 AI (ref 0.0–0.9)
ENA SSA (RO) Ab: <0.2 AI (ref 0.0–0.9)
ENA SSB (LA) Ab: <0.2 AI (ref 0.0–0.9)

## 2017-02-17 LAB — LIPID PANEL
Chol/HDL Ratio: 8.3 ratio — ABNORMAL HIGH (ref 0.0–5.0)
Cholesterol, Total: 190 mg/dL (ref 100–199)
HDL: 23 mg/dL — ABNORMAL LOW (ref >39)
LDL Calculated: 120 mg/dL — ABNORMAL HIGH (ref 0–99)
Triglycerides: 233 mg/dL — ABNORMAL HIGH (ref 0–149)
VLDL CHOLESTEROL CAL: 47 mg/dL — AB (ref 5–40)

## 2017-02-17 LAB — HIGH SENSITIVITY CRP: CRP HIGH SENSITIVITY: 3.18 mg/L — AB (ref 0.00–3.00)

## 2017-02-17 LAB — TSH: TSH: 2.54 u[IU]/mL (ref 0.450–4.500)

## 2017-02-17 LAB — VITAMIN D 25 HYDROXY (VIT D DEFICIENCY, FRACTURES): Vit D, 25-Hydroxy: 17.4 ng/mL — ABNORMAL LOW (ref 30.0–100.0)

## 2017-02-18 ENCOUNTER — Encounter: Payer: Self-pay | Admitting: Internal Medicine

## 2017-02-19 DIAGNOSIS — Z1211 Encounter for screening for malignant neoplasm of colon: Secondary | ICD-10-CM | POA: Diagnosis not present

## 2017-03-03 ENCOUNTER — Other Ambulatory Visit: Payer: Self-pay | Admitting: *Deleted

## 2017-03-03 ENCOUNTER — Ambulatory Visit (AMBULATORY_SURGERY_CENTER): Payer: BLUE CROSS/BLUE SHIELD | Admitting: Internal Medicine

## 2017-03-03 ENCOUNTER — Encounter: Payer: Self-pay | Admitting: Internal Medicine

## 2017-03-03 VITALS — BP 143/80 | HR 65 | Temp 96.4°F | Resp 17 | Ht 70.0 in | Wt 271.0 lb

## 2017-03-03 DIAGNOSIS — D125 Benign neoplasm of sigmoid colon: Secondary | ICD-10-CM

## 2017-03-03 DIAGNOSIS — K635 Polyp of colon: Secondary | ICD-10-CM

## 2017-03-03 DIAGNOSIS — R194 Change in bowel habit: Secondary | ICD-10-CM

## 2017-03-03 DIAGNOSIS — E785 Hyperlipidemia, unspecified: Secondary | ICD-10-CM

## 2017-03-03 DIAGNOSIS — Z86010 Personal history of colonic polyps: Secondary | ICD-10-CM | POA: Diagnosis not present

## 2017-03-03 MED ORDER — OMEGA-3-ACID ETHYL ESTERS 1 G PO CAPS: 1.0000 | ORAL_CAPSULE | Freq: Two times a day (BID) | ORAL | 1 refills | Status: DC

## 2017-03-03 MED ORDER — SODIUM CHLORIDE 0.9 % IV SOLN: 500.0000 mL | INTRAVENOUS | Status: DC

## 2017-03-03 MED ORDER — ATORVASTATIN CALCIUM 40 MG PO TABS: 40.0000 mg | ORAL_TABLET | Freq: Every day | ORAL | 1 refills | Status: DC

## 2017-03-03 NOTE — Progress Notes (Signed)
A/ox3 pleased with MAC, report to Karen RN 

## 2017-03-03 NOTE — Progress Notes (Signed)
Called to room to assist during endoscopic procedure.  Patient ID and intended procedure confirmed with present staff. Received instructions for my participation in the procedure from the performing physician.  

## 2017-03-03 NOTE — Progress Notes (Signed)
Pt's states no medical or surgical changes since previsit or office visit. 

## 2017-03-03 NOTE — Op Note (Signed)
[facility] Patient Name: Francisco Allen Procedure Date: 03/03/2017 8:10 AM MRN: [id number] Endoscopist: Docia Chuck. Henrene Pastor , MD Age: 56 Referring MD:  Date of Birth: 01/04/1961 Gender: Male Account #: [id number] Procedure:                Colonoscopy w/ cold snare x 1 Indications:              High risk colon cancer surveillance: Personal                            history of adenoma less than 10 mm in size,                            Incidental change in bowel habits noted, rectal                            bleeding. Index 2007 (HP); 2014 (TA) Medicines:                Monitored Anesthesia Care Procedure:                Pre-Anesthesia Assessment:                           - Prior to the procedure, a History and Physical                            was performed, and patient medications and                            allergies were reviewed. The patient's tolerance of                            previous anesthesia was also reviewed. The risks                            and benefits of the procedure and the sedation                            options and risks were discussed with the patient.                            All questions were answered, and informed consent                            was obtained. Prior Anticoagulants: The patient has                            taken no previous anticoagulant or antiplatelet                            agents. ASA Grade Assessment: II - A patient with                            mild systemic disease. After reviewing the risks  and benefits, the patient was deemed in                            satisfactory condition to undergo the procedure.                           After obtaining informed consent, the colonoscope                            was passed under direct vision. Throughout the                            procedure, the patient's blood pressure, pulse, and                            oxygen saturations  were monitored continuously. The                            Model CF-HQ190L [id number]) scope was introduced                            through the anus and advanced to the the cecum,                            identified by appendiceal orifice and ileocecal                            valve. The ileocecal valve, appendiceal orifice,                            and rectum were photographed. The quality of the                            bowel preparation was excellent. The colonoscopy                            was performed without difficulty. The patient                            tolerated the procedure well. The bowel preparation                            used was SUPREP. Scope In: 8:46:31 AM Scope Out: 9:01:23 AM Scope Withdrawal Time: 0 hours 13 minutes 47 seconds  Total Procedure Duration: 0 hours 14 minutes 52 seconds  Findings:                 A 5 mm polyp was found in the sigmoid colon. The                            polyp was removed with a cold snare. Resection and                            retrieval were complete.  Multiple small and large-mouthed diverticula were                            found in the left colon. SEVERE SIGMOID STENOSIS.                           Internal hemorrhoids were found during retroflexion.                           The exam was otherwise without abnormality on                            direct and retroflexion views. Complications:            No immediate complications. Estimated blood loss:                            None. Estimated Blood Loss:     Estimated blood loss: none. Impression:               - One 5 mm polyp in the sigmoid colon, removed with                            a cold snare. Resected and retrieved.                           - Diverticulosis in the left colon.                           - Internal hemorrhoids.                           - The examination was otherwise normal on direct                             and retroflexion views. Recommendation:           - Repeat colonoscopy in 5 years for surveillance                            (PEDS SCOPE).                           - Patient has a contact number available for                            emergencies. The signs and symptoms of potential                            delayed complications were discussed with the                            patient. Return to normal activities tomorrow.                            Written discharge instructions were provided to the  patient.                           - Resume previous diet.                           - Continue present medications.                           - Await pathology results. Docia Chuck. Henrene Pastor, MD 03/03/2017 9:21:28 AM This report has been signed electronically.

## 2017-03-03 NOTE — Patient Instructions (Signed)
Handouts given:  Diverticulosis and Polyps  YOU HAD AN ENDOSCOPIC PROCEDURE TODAY AT THE [facility]:   Refer to the procedure report that was given to you for any specific questions about what was found during the examination.  If the procedure report does not answer your questions, please call your gastroenterologist to clarify.  If you requested that your care partner not be given the details of your procedure findings, then the procedure report has been included in a sealed envelope for you to review at your convenience later.  YOU SHOULD EXPECT: Some feelings of bloating in the abdomen. Passage of more gas than usual.  Walking can help get rid of the air that was put into your GI tract during the procedure and reduce the bloating. If you had a lower endoscopy (such as a colonoscopy or flexible sigmoidoscopy) you may notice spotting of blood in your stool or on the toilet paper. If you underwent a bowel prep for your procedure, you may not have a normal bowel movement for a few days.  Please Note:  You might notice some irritation and congestion in your nose or some drainage.  This is from the oxygen used during your procedure.  There is no need for concern and it should clear up in a day or so.  SYMPTOMS TO REPORT IMMEDIATELY:   Following lower endoscopy (colonoscopy or flexible sigmoidoscopy):  Excessive amounts of blood in the stool  Significant tenderness or worsening of abdominal pains  Swelling of the abdomen that is new, acute  Fever of 100F or higher   For urgent or emergent issues, a gastroenterologist can be reached at any hour by calling [phone].   DIET:  We do recommend a small meal at first, but then you may proceed to your regular diet.  Drink plenty of fluids but you should avoid alcoholic beverages for 24 hours.  ACTIVITY:  You should plan to take it easy for the rest of today and you should NOT DRIVE or use heavy machinery until tomorrow (because of  the sedation medicines used during the test).    FOLLOW UP: Our staff will call the number listed on your records the next business day following your procedure to check on you and address any questions or concerns that you may have regarding the information given to you following your procedure. If we do not reach you, we will leave a message.  However, if you are feeling well and you are not experiencing any problems, there is no need to return our call.  We will assume that you have returned to your regular daily activities without incident.  If any biopsies were taken you will be contacted by phone or by letter within the next 1-3 weeks.  Please call us at [phone] if you have not heard about the biopsies in 3 weeks.    SIGNATURES/CONFIDENTIALITY: You and/or your care partner have signed paperwork which will be entered into your electronic medical record.  These signatures attest to the fact that that the information above on your After Visit Summary has been reviewed and is understood.  Full responsibility of the confidentiality of this discharge information lies with you and/or your care-partner. 

## 2017-03-04 ENCOUNTER — Telehealth: Payer: Self-pay | Admitting: *Deleted

## 2017-03-04 NOTE — Telephone Encounter (Signed)
  Follow up Call-  Call back number 03/03/2017  Post procedure Call Back phone  # [phone]  Permission to leave phone message Yes  Some recent data might be hidden     Patient questions:  Do you have a fever, pain , or abdominal swelling? No. Pain Score  0 *  Have you tolerated food without any problems? Yes.    Have you been able to return to your normal activities? Yes.    Do you have any questions about your discharge instructions: Diet   No. Medications  No. Follow up visit  No.  Do you have questions or concerns about your Care? No.  Actions: * If pain score is 4 or above: No action needed, pain <4.

## 2017-03-09 ENCOUNTER — Encounter: Payer: Self-pay | Admitting: Internal Medicine

## 2017-04-22 ENCOUNTER — Encounter: Payer: Self-pay | Admitting: Physician Assistant

## 2017-04-22 ENCOUNTER — Ambulatory Visit (INDEPENDENT_AMBULATORY_CARE_PROVIDER_SITE_OTHER): Payer: BLUE CROSS/BLUE SHIELD | Admitting: Physician Assistant

## 2017-04-22 VITALS — BP 111/73 | HR 81 | Temp 97.3°F | Ht 70.0 in | Wt 261.0 lb

## 2017-04-22 DIAGNOSIS — K5732 Diverticulitis of large intestine without perforation or abscess without bleeding: Secondary | ICD-10-CM

## 2017-04-22 DIAGNOSIS — K921 Melena: Secondary | ICD-10-CM | POA: Insufficient documentation

## 2017-04-22 MED ORDER — CIPROFLOXACIN HCL 500 MG PO TABS: 500.0000 mg | ORAL_TABLET | Freq: Two times a day (BID) | ORAL | 0 refills | Status: DC

## 2017-04-22 MED ORDER — METRONIDAZOLE 500 MG PO TABS: 500.0000 mg | ORAL_TABLET | Freq: Three times a day (TID) | ORAL | 0 refills | Status: DC

## 2017-04-22 NOTE — Patient Instructions (Signed)

## 2017-04-25 NOTE — Progress Notes (Signed)
BP 111/73   Pulse 81   Temp 97.3 F (36.3 C) (Oral)   Ht 5\' 10"  (1.778 m)   Wt 261 lb (118.4 kg)   BMI 37.45 kg/m    Subjective:    Patient ID: Francisco Allen, male    DOB: 1961-07-04, 56 y.o.   MRN: [id number]  HPI: Francisco Allen is a 56 y.o. male presenting on 04/22/2017 for Blood In Stools  Patient with known recerrent diverticulitis. His last colonoscopy was earlier this year.  It showed just a few polyps and diverticulosis.  He does not know what triggered this episode. He denies fever or chills, no nausea or vomiting.  Relevant past medical, surgical, family and social history reviewed and updated as indicated. Allergies and medications reviewed and updated.  Past Medical History:  Diagnosis Date  . Colon polyp   . Diverticulitis   . Hyperlipidemia     Past Surgical History:  Procedure Laterality Date  . COLONOSCOPY    . CYST EXCISION  2004   Roof of mouth  . SHOULDER SURGERY Right     Review of Systems  Constitutional: Negative.  Negative for appetite change and fatigue.  HENT: Negative.   Eyes: Negative.  Negative for pain and visual disturbance.  Respiratory: Negative.  Negative for cough, chest tightness, shortness of breath and wheezing.   Cardiovascular: Negative.  Negative for chest pain, palpitations and leg swelling.  Gastrointestinal: Positive for abdominal pain, blood in stool and diarrhea. Negative for constipation, nausea, rectal pain and vomiting.  Endocrine: Negative.   Genitourinary: Negative.   Musculoskeletal: Negative.   Skin: Negative.  Negative for color change and rash.  Neurological: Negative.  Negative for weakness, numbness and headaches.  Psychiatric/Behavioral: Negative.     Allergies as of 04/22/2017      Reactions   Bee Venom Other (See Comments)   Anaphylactic shock   Penicillins Swelling   Airway closes up      Medication List       Accurate as of 04/22/17 11:59 PM. Always use your most recent med list.            ALPRAZolam 1 MG tablet Commonly known as:  XANAX Take 1 tablet (1 mg total) by mouth 3 (three) times daily.   aspirin 81 MG tablet Take 81 mg by mouth daily.   atorvastatin 40 MG tablet Commonly known as:  LIPITOR Take 1 tablet (40 mg total) by mouth daily.   ciprofloxacin 500 MG tablet Commonly known as:  CIPRO Take 1 tablet (500 mg total) by mouth 2 (two) times daily.   clobetasol 0.05 % external solution Commonly known as:  TEMOVATE Apply 1 application topically 2 (two) times daily.   MENS 50+ MULTI VITAMIN/MIN PO Take by mouth daily.   metroNIDAZOLE 500 MG tablet Commonly known as:  FLAGYL Take 1 tablet (500 mg total) by mouth 3 (three) times daily.   omega-3 acid ethyl esters 1 g capsule Commonly known as:  LOVAZA Take 1 capsule (1 g total) by mouth 2 (two) times daily.          Objective:    BP 111/73   Pulse 81   Temp 97.3 F (36.3 C) (Oral)   Ht 5\' 10"  (1.778 m)   Wt 261 lb (118.4 kg)   BMI 37.45 kg/m   Allergies  Allergen Reactions  . Bee Venom Other (See Comments)    Anaphylactic shock  . Penicillins Swelling    Airway closes up  Physical Exam  Constitutional: He appears well-developed and well-nourished.  HENT:  Head: Normocephalic and atraumatic.  Eyes: Conjunctivae and EOM are normal. Pupils are equal, round, and reactive to light.  Neck: Normal range of motion. Neck supple.  Cardiovascular: Normal rate, regular rhythm and normal heart sounds.   Pulmonary/Chest: Effort normal and breath sounds normal.  Abdominal: Soft. Bowel sounds are normal. There is tenderness in the left lower quadrant. There is no rigidity, no rebound and no guarding.    Musculoskeletal: Normal range of motion.  Skin: Skin is warm and dry.  Nursing note and vitals reviewed.       Assessment & Plan:   1. Diverticulitis of colon - metroNIDAZOLE (FLAGYL) 500 MG tablet; Take 1 tablet (500 mg total) by mouth 3 (three) times daily.  Dispense: 21 tablet; Refill:  0 - ciprofloxacin (CIPRO) 500 MG tablet; Take 1 tablet (500 mg total) by mouth 2 (two) times daily.  Dispense: 20 tablet; Refill: 0  2. Hematochezia - metroNIDAZOLE (FLAGYL) 500 MG tablet; Take 1 tablet (500 mg total) by mouth 3 (three) times daily.  Dispense: 21 tablet; Refill: 0 - ciprofloxacin (CIPRO) 500 MG tablet; Take 1 tablet (500 mg total) by mouth 2 (two) times daily.  Dispense: 20 tablet; Refill: 0   Current Outpatient Prescriptions:  .  ALPRAZolam (XANAX) 1 MG tablet, Take 1 tablet (1 mg total) by mouth 3 (three) times daily., Disp: 90 tablet, Rfl: 2 .  aspirin 81 MG tablet, Take 81 mg by mouth daily., Disp: , Rfl:  .  atorvastatin (LIPITOR) 40 MG tablet, Take 1 tablet (40 mg total) by mouth daily., Disp: 90 tablet, Rfl: 1 .  clobetasol (TEMOVATE) 0.05 % external solution, Apply 1 application topically 2 (two) times daily., Disp: 50 mL, Rfl: 1 .  Multiple Vitamins-Minerals (MENS 50+ MULTI VITAMIN/MIN PO), Take by mouth daily., Disp: , Rfl:  .  omega-3 acid ethyl esters (LOVAZA) 1 g capsule, Take 1 capsule (1 g total) by mouth 2 (two) times daily., Disp: 180 capsule, Rfl: 1 .  ciprofloxacin (CIPRO) 500 MG tablet, Take 1 tablet (500 mg total) by mouth 2 (two) times daily., Disp: 20 tablet, Rfl: 0 .  metroNIDAZOLE (FLAGYL) 500 MG tablet, Take 1 tablet (500 mg total) by mouth 3 (three) times daily., Disp: 21 tablet, Rfl: 0  Current Facility-Administered Medications:  .  0.9 %  sodium chloride infusion, 500 mL, Intravenous, Continuous, Irene Shipper, MD  Continue all other maintenance medications as listed above.  Follow up plan: Return if symptoms worsen or fail to improve.  Educational handout given for diverticulitis  Terald Sleeper PA-C [facility] [address]  [place], [place] [address] [phone]   04/25/2017, 10:04 PM

## 2017-05-17 ENCOUNTER — Other Ambulatory Visit: Payer: Self-pay | Admitting: Family Medicine

## 2017-05-17 DIAGNOSIS — F411 Generalized anxiety disorder: Secondary | ICD-10-CM

## 2017-05-17 NOTE — Telephone Encounter (Signed)
Last filled 05/17/17. Last seen 02/09/2017. Any refill will go on file at pharmacy since filled today

## 2017-05-17 NOTE — Telephone Encounter (Signed)
Patient needs an appointment, is said to come back around now, we can give him enough to get through the appointment date but he needs to come in.

## 2017-05-18 ENCOUNTER — Telehealth: Payer: Self-pay | Admitting: Family Medicine

## 2017-05-18 NOTE — Telephone Encounter (Signed)
FYI

## 2017-05-18 NOTE — Telephone Encounter (Signed)
Spoke to pt and he still has enough until he comes to see Dr Dettinger for his 6 month f/u which he states he will schedule through [employer].

## 2017-06-24 ENCOUNTER — Ambulatory Visit (INDEPENDENT_AMBULATORY_CARE_PROVIDER_SITE_OTHER): Payer: BLUE CROSS/BLUE SHIELD | Admitting: Nurse Practitioner

## 2017-06-24 ENCOUNTER — Encounter: Payer: Self-pay | Admitting: Nurse Practitioner

## 2017-06-24 VITALS — BP 129/86 | HR 78 | Temp 97.3°F | Ht 70.0 in | Wt 261.0 lb

## 2017-06-24 DIAGNOSIS — R1032 Left lower quadrant pain: Secondary | ICD-10-CM

## 2017-06-24 DIAGNOSIS — K5733 Diverticulitis of large intestine without perforation or abscess with bleeding: Secondary | ICD-10-CM

## 2017-06-24 DIAGNOSIS — K625 Hemorrhage of anus and rectum: Secondary | ICD-10-CM

## 2017-06-24 LAB — HEMOGLOBIN, FINGERSTICK: Hemoglobin: 18.4 g/dL — ABNORMAL HIGH (ref 12.6–17.7)

## 2017-06-24 MED ORDER — METRONIDAZOLE 500 MG PO TABS: 500.0000 mg | ORAL_TABLET | Freq: Two times a day (BID) | ORAL | 0 refills | Status: DC

## 2017-06-24 MED ORDER — CIPROFLOXACIN HCL 500 MG PO TABS: 500.0000 mg | ORAL_TABLET | Freq: Two times a day (BID) | ORAL | 0 refills | Status: DC

## 2017-06-24 NOTE — Progress Notes (Signed)
   Subjective:    Patient ID: Francisco Allen, male    DOB: 11/09/1961, 56 y.o.   MRN: [id number]  HPI Patient in the office today due to passing blood in his stool and left lower quadrant pain.  He has a history of diverticulitis.  Patient noticed blood in stool since this morning with 5 bowel movements since this morning.  Patient had a colonoscopy in April 2018.  Review of Systems  Gastrointestinal: Positive for abdominal pain (LLQ x 1 day).       Blood in stool x 1 day.  5 BMs today each with progressively less blood.  All other systems reviewed and are negative.      Objective:   Physical Exam  Constitutional: He is oriented to person, place, and time. He appears well-developed and well-nourished. No distress.  HENT:  Head: Normocephalic.  Cardiovascular: Normal rate, regular rhythm and normal heart sounds.   No murmur heard. Pulmonary/Chest: Effort normal and breath sounds normal. No respiratory distress.  Abdominal: Soft. Bowel sounds are normal. There is tenderness (left lower quadrant on light palpation).  Neurological: He is alert and oriented to person, place, and time.  Skin: Skin is warm.  Psychiatric: He has a normal mood and affect. His behavior is normal. Judgment and thought content normal.  BP 129/86   Pulse 78   Temp (!) 97.3 F (36.3 C) (Oral)   Ht 5\' 10"  (1.778 m)   Wt 261 lb (118.4 kg)   BMI 37.45 kg/m          Assessment & Plan:  1. Rectal bleeding Stool sofners - Hemoglobin, fingerstick  2. Diverticulitis large intestine w/o perforation or abscess w/bleeding Reviewed diet and foods to avoid - ciprofloxacin (CIPRO) 500 MG tablet; Take 1 tablet (500 mg total) by mouth 2 (two) times daily.  Dispense: 20 tablet; Refill: 0 - metroNIDAZOLE (FLAGYL) 500 MG tablet; Take 1 tablet (500 mg total) by mouth 2 (two) times daily.  Dispense: 14 tablet; Refill: 0  3. Left lower quadrant pain Will call with CT scan results - CT Abdomen Pelvis W Contrast;  Future  Mary-Margaret Hassell Done, FNP

## 2017-06-24 NOTE — Patient Instructions (Signed)

## 2017-07-01 ENCOUNTER — Other Ambulatory Visit: Payer: Self-pay | Admitting: Family Medicine

## 2017-07-01 DIAGNOSIS — F411 Generalized anxiety disorder: Secondary | ICD-10-CM

## 2017-07-02 ENCOUNTER — Ambulatory Visit (HOSPITAL_COMMUNITY): Payer: BLUE CROSS/BLUE SHIELD

## 2017-07-05 NOTE — Telephone Encounter (Signed)
Phoned in.

## 2017-07-05 NOTE — Telephone Encounter (Signed)
Go ahead and call in refill for patient.

## 2017-08-18 ENCOUNTER — Ambulatory Visit: Payer: Self-pay | Admitting: Family Medicine

## 2017-09-10 ENCOUNTER — Encounter: Payer: Self-pay | Admitting: Family Medicine

## 2017-09-10 ENCOUNTER — Ambulatory Visit (INDEPENDENT_AMBULATORY_CARE_PROVIDER_SITE_OTHER): Payer: BLUE CROSS/BLUE SHIELD | Admitting: Family Medicine

## 2017-09-10 VITALS — BP 137/91 | HR 73 | Temp 98.5°F | Ht 70.0 in | Wt 265.0 lb

## 2017-09-10 DIAGNOSIS — R7303 Prediabetes: Secondary | ICD-10-CM

## 2017-09-10 DIAGNOSIS — E782 Mixed hyperlipidemia: Secondary | ICD-10-CM | POA: Diagnosis not present

## 2017-09-10 DIAGNOSIS — F411 Generalized anxiety disorder: Secondary | ICD-10-CM

## 2017-09-10 DIAGNOSIS — Z23 Encounter for immunization: Secondary | ICD-10-CM

## 2017-09-10 DIAGNOSIS — Z6839 Body mass index (BMI) 39.0-39.9, adult: Secondary | ICD-10-CM

## 2017-09-10 MED ORDER — BUPROPION HCL ER (XL) 150 MG PO TB24: 150.0000 mg | ORAL_TABLET | Freq: Every day | ORAL | 1 refills | Status: DC

## 2017-09-10 MED ORDER — ALPRAZOLAM 1 MG PO TABS: 1.0000 mg | ORAL_TABLET | Freq: Three times a day (TID) | ORAL | 2 refills | Status: DC | PRN

## 2017-09-10 NOTE — Progress Notes (Signed)
BP (!) 137/91   Pulse 73   Temp 98.5 F (36.9 C) (Oral)   Ht _0  (1.778 m)   Wt 265 lb (120.2 kg)   BMI 38.02 kg/m    Subjective:    Patient ID: Francisco Allen, male    DOB: [birth year], 56 y.o.   MRN: [id number]  HPI: Francisco Allen is a 56 y.o. male presenting on 09/10/2017 for Follow-up (6 month ); Hyperlipidemia; and Prediabetes   HPI Prediabetes Patient comes in today for recheck of his diabetes. Patient has been currently taking diet control and lifestyle changes. Patient is not currently on an ACE inhibitor/ARB. Patient has not seen an ophthalmologist this year. Patient denies any issues with their feet.   Hyperlipidemia Patient is coming in for recheck of his hyperlipidemia. The patient is currently taking fish oil and Lipitor. They deny any issues with myalgias or history of liver damage from it. They deny any focal numbness or weakness or chest pain.   Anxiety Patient is coming in to discuss anxiety.  He says his anxiety has been building and continue to build and he is trying to moderate his alprazolam use but he just feels like it is not touching it as well and his anxiety has built up almost all the time.  He denies any feelings of sadness or depression or suicidal ideations.  He is finally to the point where he is willing to try something along with it.  Relevant past medical, surgical, family and social history reviewed and updated as indicated. Interim medical history since our last visit reviewed. Allergies and medications reviewed and updated.  Review of Systems  Constitutional: Negative for chills and fever.  Respiratory: Negative for shortness of breath and wheezing.   Cardiovascular: Negative for chest pain and leg swelling.  Musculoskeletal: Negative for back pain and gait problem.  Skin: Negative for rash.  Psychiatric/Behavioral: Positive for decreased concentration, dysphoric mood and sleep disturbance. Negative for self-injury and suicidal ideas. The  patient is nervous/anxious.   All other systems reviewed and are negative.  Per HPI unless specifically indicated above    Objective:    BP (!) 137/91   Pulse 73   Temp 98.5 F (36.9 C) (Oral)   Ht _1  (1.778 m)   Wt 265 lb (120.2 kg)   BMI 38.02 kg/m   Wt Readings from Last 3 Encounters:  09/10/17 265 lb (120.2 kg)  06/24/17 261 lb (118.4 kg)  04/22/17 261 lb (118.4 kg)    Physical Exam  Constitutional: He is oriented to person, place, and time. He appears well-developed and well-nourished. No distress.  Eyes: Conjunctivae are normal. No scleral icterus.  Cardiovascular: Normal rate, regular rhythm, normal heart sounds and intact distal pulses.  No murmur heard. Pulmonary/Chest: Effort normal and breath sounds normal. No respiratory distress. He has no wheezes.  Musculoskeletal: Normal range of motion. He exhibits no edema.  Neurological: He is alert and oriented to person, place, and time. Coordination normal.  Skin: Skin is warm and dry. No rash noted. He is not diaphoretic.  Psychiatric: His behavior is normal. Judgment normal. His mood appears anxious. He exhibits a depressed mood. He expresses no suicidal ideation. He expresses no suicidal plans.  Nursing note and vitals reviewed.       Assessment & Plan:   Problem List Items Addressed This Visit      Other   GAD (generalized anxiety disorder)   Relevant Medications   ALPRAZolam (XANAX) 1 MG  tablet   buPROPion (WELLBUTRIN XL) 150 MG 24 hr tablet   Hyperlipidemia - Primary   Relevant Orders   Lipid panel   BMI 39.0-39.9,adult   Prediabetes   Relevant Orders   Bayer DCA Hb A1c Waived   CMP14+EGFR    Other Visit Diagnoses    Need for immunization against influenza       Relevant Orders   Flu Vaccine QUAD 36+ mos IM (Completed)       Follow up plan: Return in about 3 months (around 12/11/2017), or if symptoms worsen or fail to improve, for Prediabetes and cholesterol.  Counseling provided for all of  the vaccine components Orders Placed This Encounter  Procedures  . Flu Vaccine QUAD 36+ mos IM  . Bayer DCA Hb A1c Waived  . CMP14+EGFR  . Lipid panel    Caryl Pina, MD [facility] 09/10/2017, 4:54 PM

## 2017-09-23 ENCOUNTER — Encounter (HOSPITAL_COMMUNITY): Payer: Self-pay | Admitting: *Deleted

## 2017-09-23 ENCOUNTER — Encounter: Payer: Self-pay | Admitting: Nurse Practitioner

## 2017-09-23 ENCOUNTER — Observation Stay (HOSPITAL_COMMUNITY)
Admission: EM | Admit: 2017-09-23 | Discharge: 2017-09-24 | Disposition: A | Discharge status: Home / Self Care | Payer: BLUE CROSS/BLUE SHIELD | Attending: Internal Medicine | Admitting: Internal Medicine

## 2017-09-23 ENCOUNTER — Ambulatory Visit: Payer: BLUE CROSS/BLUE SHIELD | Admitting: Nurse Practitioner

## 2017-09-23 ENCOUNTER — Emergency Department (HOSPITAL_COMMUNITY): Payer: BLUE CROSS/BLUE SHIELD

## 2017-09-23 ENCOUNTER — Other Ambulatory Visit: Payer: Self-pay

## 2017-09-23 VITALS — BP 135/89 | HR 81 | Temp 97.7°F | Ht 70.0 in | Wt 267.0 lb

## 2017-09-23 DIAGNOSIS — E669 Obesity, unspecified: Secondary | ICD-10-CM | POA: Diagnosis not present

## 2017-09-23 DIAGNOSIS — I10 Essential (primary) hypertension: Secondary | ICD-10-CM | POA: Diagnosis not present

## 2017-09-23 DIAGNOSIS — I2510 Atherosclerotic heart disease of native coronary artery without angina pectoris: Secondary | ICD-10-CM | POA: Insufficient documentation

## 2017-09-23 DIAGNOSIS — R918 Other nonspecific abnormal finding of lung field: Secondary | ICD-10-CM | POA: Diagnosis not present

## 2017-09-23 DIAGNOSIS — E785 Hyperlipidemia, unspecified: Secondary | ICD-10-CM | POA: Insufficient documentation

## 2017-09-23 DIAGNOSIS — H538 Other visual disturbances: Secondary | ICD-10-CM | POA: Diagnosis not present

## 2017-09-23 DIAGNOSIS — Z79899 Other long term (current) drug therapy: Secondary | ICD-10-CM | POA: Insufficient documentation

## 2017-09-23 DIAGNOSIS — Z86010 Personal history of colonic polyps: Secondary | ICD-10-CM

## 2017-09-23 DIAGNOSIS — Z880 Allergy status to penicillin: Secondary | ICD-10-CM | POA: Diagnosis not present

## 2017-09-23 DIAGNOSIS — I200 Unstable angina: Secondary | ICD-10-CM | POA: Diagnosis not present

## 2017-09-23 DIAGNOSIS — R0789 Other chest pain: Principal | ICD-10-CM | POA: Insufficient documentation

## 2017-09-23 DIAGNOSIS — Z7982 Long term (current) use of aspirin: Secondary | ICD-10-CM | POA: Diagnosis not present

## 2017-09-23 DIAGNOSIS — F411 Generalized anxiety disorder: Secondary | ICD-10-CM | POA: Diagnosis not present

## 2017-09-23 DIAGNOSIS — R079 Chest pain, unspecified: Secondary | ICD-10-CM | POA: Diagnosis not present

## 2017-09-23 DIAGNOSIS — F17210 Nicotine dependence, cigarettes, uncomplicated: Secondary | ICD-10-CM | POA: Diagnosis not present

## 2017-09-23 DIAGNOSIS — Z720 Tobacco use: Secondary | ICD-10-CM | POA: Diagnosis not present

## 2017-09-23 DIAGNOSIS — Z8249 Family history of ischemic heart disease and other diseases of the circulatory system: Secondary | ICD-10-CM | POA: Diagnosis not present

## 2017-09-23 DIAGNOSIS — E1169 Type 2 diabetes mellitus with other specified complication: Secondary | ICD-10-CM | POA: Diagnosis present

## 2017-09-23 DIAGNOSIS — E1159 Type 2 diabetes mellitus with other circulatory complications: Secondary | ICD-10-CM | POA: Diagnosis present

## 2017-09-23 HISTORY — DX: Unspecified osteoarthritis, unspecified site: M19.90

## 2017-09-23 HISTORY — DX: Prediabetes: R73.03

## 2017-09-23 HISTORY — DX: Gastro-esophageal reflux disease without esophagitis: K21.9

## 2017-09-23 LAB — CBC
HEMATOCRIT: 53.1 % — AB (ref 39.0–52.0)
Hemoglobin: 18.7 g/dL — ABNORMAL HIGH (ref 13.0–17.0)
MCH: 31.5 pg (ref 26.0–34.0)
MCHC: 35.2 g/dL (ref 30.0–36.0)
MCV: 89.5 fL (ref 78.0–100.0)
PLATELETS: 166 10*3/uL (ref 150–400)
RBC: 5.93 MIL/uL — ABNORMAL HIGH (ref 4.22–5.81)
RDW: 13.7 % (ref 11.5–15.5)
WBC: 8.6 10*3/uL (ref 4.0–10.5)

## 2017-09-23 LAB — BASIC METABOLIC PANEL
Anion gap: 9 (ref 5–15)
BUN: 14 mg/dL (ref 6–20)
CHLORIDE: 101 mmol/L (ref 101–111)
CO2: 27 mmol/L (ref 22–32)
CREATININE: 0.93 mg/dL (ref 0.61–1.24)
Calcium: 9.3 mg/dL (ref 8.9–10.3)
GFR calc Af Amer: >60 mL/min (ref >60)
GFR calc non Af Amer: >60 mL/min (ref >60)
GLUCOSE: 105 mg/dL — AB (ref 65–99)
POTASSIUM: 4 mmol/L (ref 3.5–5.1)
SODIUM: 137 mmol/L (ref 135–145)

## 2017-09-23 LAB — I-STAT TROPONIN, ED: Troponin i, poc: 0 ng/mL (ref 0.00–0.08)

## 2017-09-23 LAB — LIPID PANEL
CHOL/HDL RATIO: 9.4 ratio
Cholesterol: 236 mg/dL — ABNORMAL HIGH (ref 0–200)
HDL: 25 mg/dL — AB (ref >40)
LDL Cholesterol: 138 mg/dL — ABNORMAL HIGH (ref 0–99)
Triglycerides: 366 mg/dL — ABNORMAL HIGH (ref <150)
VLDL: 73 mg/dL — AB (ref 0–40)

## 2017-09-23 LAB — TROPONIN I

## 2017-09-23 MED ORDER — SODIUM CHLORIDE 0.9 % IV SOLN: 500.0000 mL | INTRAVENOUS | Status: DC

## 2017-09-23 MED ORDER — ATORVASTATIN CALCIUM 40 MG PO TABS
40.0000 mg | ORAL_TABLET | Freq: Every day | ORAL | Status: DC
  Administered 2017-09-23 – 2017-09-24 (×2): 40 mg via ORAL
  Filled 2017-09-23 (×2): qty 1

## 2017-09-23 MED ORDER — GI COCKTAIL ~~LOC~~: 30.0000 mL | Freq: Four times a day (QID) | ORAL | Status: DC | PRN

## 2017-09-23 MED ORDER — ONDANSETRON HCL 4 MG/2ML IJ SOLN: 4.0000 mg | Freq: Four times a day (QID) | INTRAMUSCULAR | Status: DC | PRN

## 2017-09-23 MED ORDER — METOPROLOL TARTRATE 25 MG PO TABS
25.0000 mg | ORAL_TABLET | Freq: Two times a day (BID) | ORAL | Status: DC
  Administered 2017-09-23 – 2017-09-24 (×2): 25 mg via ORAL
  Filled 2017-09-23 (×2): qty 1

## 2017-09-23 MED ORDER — ASPIRIN EC 325 MG PO TBEC
325.0000 mg | DELAYED_RELEASE_TABLET | Freq: Every day | ORAL | Status: DC
Start: 2017-09-24 — End: 2017-09-24
  Administered 2017-09-24: 325 mg via ORAL
  Filled 2017-09-23: qty 1

## 2017-09-23 MED ORDER — ALPRAZOLAM 0.5 MG PO TABS
1.0000 mg | ORAL_TABLET | Freq: Three times a day (TID) | ORAL | Status: DC | PRN
  Administered 2017-09-23 – 2017-09-24 (×3): 1 mg via ORAL
  Filled 2017-09-23 (×3): qty 4

## 2017-09-23 MED ORDER — ASPIRIN 81 MG PO CHEW
81.0000 mg | CHEWABLE_TABLET | ORAL | Status: AC
  Administered 2017-09-24: 81 mg via ORAL
  Filled 2017-09-23: qty 1

## 2017-09-23 MED ORDER — SODIUM CHLORIDE 0.9 % IV SOLN: 250.0000 mL | INTRAVENOUS | Status: DC | PRN

## 2017-09-23 MED ORDER — ENOXAPARIN SODIUM 40 MG/0.4ML ~~LOC~~ SOLN
40.0000 mg | SUBCUTANEOUS | Status: DC
  Administered 2017-09-23: 40 mg via SUBCUTANEOUS
  Filled 2017-09-23 (×2): qty 0.4

## 2017-09-23 MED ORDER — BUPROPION HCL ER (XL) 150 MG PO TB24
150.0000 mg | ORAL_TABLET | Freq: Every day | ORAL | Status: DC
  Administered 2017-09-23 – 2017-09-24 (×2): 150 mg via ORAL
  Filled 2017-09-23 (×2): qty 1

## 2017-09-23 MED ORDER — ACETAMINOPHEN 325 MG PO TABS: 650.0000 mg | ORAL_TABLET | ORAL | Status: DC | PRN

## 2017-09-23 MED ORDER — HYDRALAZINE HCL 20 MG/ML IJ SOLN: 2.0000 mg | INTRAMUSCULAR | Status: DC | PRN

## 2017-09-23 MED ORDER — SODIUM CHLORIDE 0.9 % WEIGHT BASED INFUSION
3.0000 mL/kg/h | INTRAVENOUS | Status: DC
  Administered 2017-09-24: 3 mL/kg/h via INTRAVENOUS

## 2017-09-23 MED ORDER — MORPHINE SULFATE (PF) 4 MG/ML IV SOLN: 2.0000 mg | INTRAVENOUS | Status: DC | PRN

## 2017-09-23 MED ORDER — SODIUM CHLORIDE 0.9% FLUSH
3.0000 mL | Freq: Two times a day (BID) | INTRAVENOUS | Status: DC
  Administered 2017-09-23 – 2017-09-24 (×2): 3 mL via INTRAVENOUS

## 2017-09-23 MED ORDER — SODIUM CHLORIDE 0.9 % WEIGHT BASED INFUSION: 1.0000 mL/kg/h | INTRAVENOUS | Status: DC

## 2017-09-23 MED ORDER — SODIUM CHLORIDE 0.9% FLUSH: 3.0000 mL | INTRAVENOUS | Status: DC | PRN

## 2017-09-23 NOTE — Patient Instructions (Signed)
Chest Wall Pain °Chest wall pain is pain in or around the bones and muscles of your chest. Sometimes, an injury causes this pain. Sometimes, the cause may not be known. This pain may take several weeks or longer to get better. °Follow these instructions at home: °Pay attention to any changes in your symptoms. Take these actions to help with your pain: °· Rest as told by your doctor. °· Avoid activities that cause pain. Try not to use your chest, belly (abdominal), or side muscles to lift heavy things. °· If directed, apply ice to the painful area: °? Put ice in a plastic bag. °? Place a towel between your skin and the bag. °? Leave the ice on for 20 minutes, 2-3 times per day. °· Take over-the-counter and prescription medicines only as told by your doctor. °· Do not use tobacco products, including cigarettes, chewing tobacco, and e-cigarettes. If you need help quitting, ask your doctor. °· Keep all follow-up visits as told by your doctor. This is important. ° °Contact a doctor if: °· You have a fever. °· Your chest pain gets worse. °· You have new symptoms. °Get help right away if: °· You feel sick to your stomach (nauseous) or you throw up (vomit). °· You feel sweaty or light-headed. °· You have a cough with phlegm (sputum) or you cough up blood. °· You are short of breath. °This information is not intended to replace advice given to you by your health care provider. Make sure you discuss any questions you have with your health care provider. °Document Released: 04/13/2008 Document Revised: 04/02/2016 Document Reviewed: 01/21/2015 °Elsevier Interactive Patient Education © 2018 [employer] ° °

## 2017-09-23 NOTE — Progress Notes (Signed)
   Subjective:    Patient ID: Francisco Allen, male    DOB: 04-10-1961, 57 y.o.   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 His vision started getting blurry and started having pressure in his chest. He went to office and blood pressure was 160/100. EMS was called. Ekg was done which showed NSR. EMS vitals showed 149/92, pulse ox 97% and HR was 86. He says that he still feels pressure in his chest. Vision has improved. Rates chest pain 6/10 currently. Slight sob.    Review of Systems  Constitutional: Negative for chills and fatigue.  HENT: Negative.   Cardiovascular: Positive for chest pain.  Gastrointestinal: Negative.   Genitourinary: Negative.   Neurological: Negative.   Psychiatric/Behavioral: Negative.   All other systems reviewed and are negative.      Objective:   Physical Exam  Constitutional: He is oriented to person, place, and time. He appears well-developed and well-nourished. No distress.  Cardiovascular: Normal rate and regular rhythm.  Pulmonary/Chest: Effort normal.  Neurological: He is alert and oriented to person, place, and time.  Skin: Skin is warm.  Psychiatric: He has a normal mood and affect. His behavior is normal. Judgment and thought content normal.   EKG- Old infarct- Mary-Margaret Hassell Done, FNP    BP 135/89   Pulse 81   Temp 97.7 F (36.5 C) (Oral)   Ht 5\' 10"  (1.778 m)   Wt 267 lb (121.1 kg)   SpO2 92%   BMI 38.31 kg/m       Assessment & Plan:   1. Chest pain, unspecified type    Consulted with Dr. Warrick Parisian Needs to go to ER for troponin levels Wife is going to drive him  [place], FNP

## 2017-09-23 NOTE — ED Provider Notes (Signed)
[facility] EMERGENCY DEPARTMENT Provider Note   CSN: [id number] Arrival date & time: 09/23/17  1223     History   Chief Complaint Chief Complaint  Patient presents with  . Chest Pain    HPI Francisco Allen is a 56 y.o. male with a past medical history of hyperlipidemia who presents to the emergency department today for chest pain and visual blurring.  Patient states that he was at work at approximately 9 AM sweeping the floor and started having central chest pain without radiation to his neck, jaw, shoulders or back.  He had associated visual blurring and shortness of breath.  Chest pain was worsened with exertion. It is non-positional. After onset of his pain, he walked over to the nurse at work where they took his blood pressure where it was found to be elevated.  They did tell him that he looked cold and clammy.  He was seen by his PCP and had a EKG that showed old infarct and sent over for blood work. His symptoms lasted for approximately 30-40 minutes until it eased up.  He says he still had some underlying pressure that did not resolve until about 4 hours after onset. He is symptom free currently. He had to 325mg  ASA chewed after onset of pain.  Patient states he is normally able to walk up several flights of stairs without becoming short of breath or having chest pain.  Patient denies any. Denies HA, focal weakness, palpitations, melena, unilateral weakness, facial asymmetry, difficulty with speech, changes in hearing, difficulty with gait.  Patient has a 28-pack-year history.  Notes that he had 2 brothers that had open heart surgery before the age of 25.  He says that his sister died of a heart attack in her sleep in the age of 25s.  Patient has never had previous cardiac workup with echo or cardiac catheterization. Denies recent surgery or travel, trauma, immobilization, previous blood clot, cough, hemoptysis, personal history of cancer, lower extremity pain or swelling,  or family history of bleeding/clotting disorder.    HPI  Past Medical History:  Diagnosis Date  . Colon polyp   . Diverticulitis   . Hyperlipidemia     Patient Active Problem List   Diagnosis Date Noted  . Hematochezia 04/22/2017  . Prediabetes 02/09/2017  . Diverticulitis of colon 10/21/2015  . BMI 39.0-39.9,adult 05/14/2015  . Migraines 04/09/2014  . GAD (generalized anxiety disorder) 04/04/2013  . Hyperlipidemia 04/04/2013    Past Surgical History:  Procedure Laterality Date  . COLONOSCOPY    . CYST EXCISION  2004   Roof of mouth  . SHOULDER SURGERY Right        Home Medications    Prior to Admission medications   Medication Sig Start Date End Date Taking? Authorizing Provider  ALPRAZolam [name]) 1 MG tablet Take 1 tablet (1 mg total) by mouth 3 (three) times daily as needed. Patient taking differently: Take 1 mg 3 (three) times daily as needed by mouth for anxiety.  09/10/17  Yes Dettinger, Fransisca Kaufmann, MD  aspirin 81 MG tablet Take 81 mg by mouth daily.   Yes [provider]  atorvastatin (LIPITOR) 40 MG tablet Take 1 tablet (40 mg total) by mouth daily. 03/03/17  Yes Dettinger, Fransisca Kaufmann, MD  buPROPion (WELLBUTRIN XL) 150 MG 24 hr tablet Take 1 tablet (150 mg total) by mouth daily. 09/10/17  Yes Dettinger, Fransisca Kaufmann, MD  Cholecalciferol (VITAMIN D PO) Take 1 capsule daily by mouth.  Yes [provider]  clobetasol (TEMOVATE) 0.05 % external solution Apply 1 application topically 2 (two) times daily. Patient taking differently: Apply 1 application daily as needed topically.  10/07/16  Yes Dettinger, Fransisca Kaufmann, MD  Multiple Vitamins-Minerals (MENS 50+ MULTI VITAMIN/MIN PO) Take by mouth daily.   Yes [provider]  omega-3 acid ethyl esters (LOVAZA) 1 g capsule Take 1 capsule (1 g total) by mouth 2 (two) times daily. Patient taking differently: Take 1 capsule daily by mouth.  03/03/17  Yes Dettinger, Fransisca Kaufmann, MD    Family History Family  History  Problem Relation Age of Onset  . Colon cancer Brother   . Heart disease Brother   . Heart disease Brother     Social History Social History   Tobacco Use  . Smoking status: Current Every Day Smoker    Packs/day: 1.00    Years: 28.00    Pack years: 28.00    Types: Cigarettes  . Smokeless tobacco: Never Used  Substance Use Topics  . Alcohol use: No    Alcohol/week: 0.0 oz  . Drug use: No     Allergies   Bee venom and Penicillins   Review of Systems Review of Systems  All other systems reviewed and are negative.    Physical Exam Updated Vital Signs BP 136/81   Pulse 71   Temp 97.7 F (36.5 C) (Oral)   Resp 18   SpO2 95%   Physical Exam  Constitutional: He appears well-developed and well-nourished.  HENT:  Head: Normocephalic and atraumatic.  Right Ear: External ear normal.  Left Ear: External ear normal.  Nose: Nose normal.  Mouth/Throat: Uvula is midline, oropharynx is clear and moist and mucous membranes are normal. No tonsillar exudate.  Eyes: Pupils are equal, round, and reactive to light. Right eye exhibits no discharge. Left eye exhibits no discharge. No scleral icterus.  Neck: Trachea normal. Neck supple. No JVD present. No spinous process tenderness present. Carotid bruit is not present. No neck rigidity. Normal range of motion present.  Cardiovascular: Normal rate, regular rhythm and intact distal pulses.  No murmur heard. Pulses:      Radial pulses are 2+ on the right side, and 2+ on the left side.       Dorsalis pedis pulses are 2+ on the right side, and 2+ on the left side.       Posterior tibial pulses are 2+ on the right side, and 2+ on the left side.  No lower extremity swelling or edema. Calves symmetric in size bilaterally.  Pulmonary/Chest: Effort normal and breath sounds normal. He exhibits no tenderness.  Abdominal: Soft. Bowel sounds are normal. There is no tenderness. There is no rebound and no guarding.  Musculoskeletal: He  exhibits no edema.  Lymphadenopathy:    He has no cervical adenopathy.  Neurological: He is alert.  Mental Status:  Alert, oriented, thought content appropriate, able to give a coherent history. Speech fluent without evidence of aphasia. Able to follow 2 step commands without difficulty.  Cranial Nerves:  II:  Peripheral visual fields grossly normal, pupils equal, round, reactive to light III,IV, VI: ptosis not present, extra-ocular motions intact bilaterally  V,VII: smile symmetric, eyebrows raise symmetric, facial light touch sensation equal VIII: hearing grossly normal to voice  X: uvula elevates symmetrically  XI: bilateral shoulder shrug symmetric and strong XII: midline tongue extension without fassiculations Motor:  Normal tone. 5/5 in upper and lower extremities bilaterally including strong and equal grip strength and  dorsiflexion/plantar flexion Sensory: Sensation intact to light touch in all extremities. Negative Romberg.  Deep Tendon Reflexes: 2+ and symmetric in the biceps and patella Cerebellar: normal finger-to-nose with bilateral upper extremities. Normal heel-to -shin balance bilaterally of the lower extremity. No pronator drift.  Gait: normal gait and balance CV: distal pulses palpable throughout   Skin: Skin is warm and dry. No rash noted. He is not diaphoretic.  Psychiatric: He has a normal mood and affect.  Nursing note and vitals reviewed.    ED Treatments / Results  Labs (all labs ordered are listed, but only abnormal results are displayed) Labs Reviewed  BASIC METABOLIC PANEL - Abnormal; Notable for the following components:      Result Value   Glucose, Bld 105 (*)    All other components within normal limits  CBC - Abnormal; Notable for the following components:   RBC 5.93 (*)    Hemoglobin 18.7 (*)    HCT 53.1 (*)    All other components within normal limits  I-STAT TROPONIN, ED    EKG  EKG Interpretation  Date/Time:  Thursday September 23 2017  12:30:49 EST Ventricular Rate:  73 PR Interval:  170 QRS Duration: 88 QT Interval:  394 QTC Calculation: 434 R Axis:   42 Text Interpretation:  Normal sinus rhythm Low voltage QRS Septal infarct , age undetermined Abnormal ECG Normal sinus rhythm Confirmed by Zenovia Jarred [phone]) on 09/23/2017 1:28:36 PM       Radiology Dg Chest 2 View  Result Date: 09/23/2017 CLINICAL DATA:  Chest pressure.  Hypertension. EXAM: CHEST  2 VIEW COMPARISON:  None. FINDINGS: Minimal opacity in left base. The heart, hila, mediastinum, lungs, and pleura are otherwise unremarkable IMPRESSION: Minimal opacity in left base could represent atelectasis or early infiltrate. Given history, I suspect atelectasis. Recommend follow-up to resolution. Electronically Signed   By: Dorise Bullion III M.D   On: 09/23/2017 13:17    Procedures Procedures (including critical care time)  Medications Ordered in ED Medications  0.9 %  sodium chloride infusion (not administered)  ALPRAZolam (XANAX) tablet 1 mg (not administered)  atorvastatin (LIPITOR) tablet 40 mg (not administered)  buPROPion (WELLBUTRIN XL) 24 hr tablet 150 mg (not administered)  acetaminophen (TYLENOL) tablet 650 mg (not administered)  ondansetron (ZOFRAN) injection 4 mg (not administered)  enoxaparin (LOVENOX) injection 40 mg (not administered)  morphine 4 MG/ML injection 2 mg (not administered)  gi cocktail (Maalox,Lidocaine,Donnatal) (not administered)  aspirin EC tablet 325 mg (not administered)     Initial Impression / Assessment and Plan / ED Course  I have reviewed the triage vital signs and the nursing notes.  Pertinent labs & imaging results that were available during my care of the patient were reviewed by me and considered in my medical decision making (see chart for details).     56 year old male presenting with chest pain associated visual changes, shortness of breath and being "cold and clammy".  Symptoms have now resolved.   In department the patient with reassuring blood pressure.  He is without fever, tachycardia, tachypnea, hypoxia or hypotension.  Patient with no new murmur, clear lungs bilaterally, no lower extremity swelling, no JVD, no carotid bruit.  Normal neurologic exam without any focal neurologic deficits.  ECG done in triage shows normal sinus rhythm.  Troponin 0.00.  No evidence of electrode abnormalities.  Normal kidney function.  No leukocytosis or anemia.  Chest x-ray with atelectasis.  Concern for cardiac etiology of Chest Pain. Patient heart score is 4.  Pt has been re-evaluated prior to consult and VSS, NAD, heart RRR, pain 0/10, lungs CTAB. No acute abnormalities found on EKG and first round of cardiac enzymes negative. This case was discussed with Dr. Thomasene Lot who has seen the patient and agrees with plan to admit. Dr. Manuella Ghazi agrees to admit the patient for chest pain rule out. Patient is in agreement with plan.  Patient is hemodynamically stable and appears safe for admission.  Final Clinical Impressions(s) / ED Diagnoses   Final diagnoses:  Other chest pain    ED Discharge Orders    None       Hurley, Sobel, Hershal Coria 09/23/17 1553    Macarthur Critchley, MD 09/25/17 1509    Macarthur Critchley, MD 09/25/17 1512

## 2017-09-23 NOTE — ED Notes (Signed)
Called patient to room, patient in X-ray, X-ray will bring patient to room.

## 2017-09-23 NOTE — ED Notes (Signed)
Pt ambulated well w/o assistance.

## 2017-09-23 NOTE — ED Notes (Signed)
Pt in xray

## 2017-09-23 NOTE — H&P (Signed)
History and Physical    Francisco Allen:[id number] DOB: [birth year] DOA: 09/23/2017  PCP: Dettinger, Fransisca Kaufmann, MD Patient coming from: home/work  Chief Complaint: chest pain  HPI: Francisco Allen is a cantankerous  56 y.o. male with medical history significant for tobacco use, hyperlipidemia, obesity tobacco use presents to emergency Department chief complaint of chest pain. [facility] are asked to admit for chest pain rule out  Information is obtained from the patient and his wife is at the bedside. 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 He describes the pain as a pressure reports that is nonradiating. Associated symptoms include blurred vision. He states he walked over to medical department and the nurse took his blood pressure was elevated. Was noted that he was "cold and clammy" at that time. He was seen by his primary care provider and had an EKG that showed an old infarct. He reports chest pain lasted approximately 30 minutes. He denies any nausea vomiting abdominal pain. Denies headache dizziness syncope or near-syncope. He denies any lower extremity edema or orthopnea. Denies any difficulty speaking chewing or swallowing. He denies a numbness tingling in his extremities. He states he had a stress test over 10 years ago but does not remember the results.    ED Course: In the emergency department he is afebrile blood pressure high end of normal he is not hypoxic. He is provided with aspirin  Review of Systems: As per HPI otherwise all other systems reviewed and are negative.   Ambulatory Status: Ambulates independently without any fall she is independent with his ADLs  Past Medical History:  Diagnosis Date  . Colon polyp   . Diverticulitis   . Hyperlipidemia     Past Surgical History:  Procedure Laterality Date  . COLONOSCOPY    . CYST EXCISION  2004   Roof of mouth   . SHOULDER SURGERY Right     Social History   Socioeconomic History  . Marital status: Married    Spouse name: Not on file  . Number of children: Not on file  . Years of education: Not on file  . Highest education level: Not on file  Social Needs  . Financial resource strain: Not on file  . Food insecurity - worry: Not on file  . Food insecurity - inability: Not on file  . Transportation needs - medical: Not on file  . Transportation needs - non-medical: Not on file  Occupational History  . Not on file  Tobacco Use  . Smoking status: Current Every Day Smoker    Packs/day: 1.00    Years: 28.00    Pack years: 28.00    Types: Cigarettes  . Smokeless tobacco: Never Used  Substance and Sexual Activity  . Alcohol use: No    Alcohol/week: 0.0 oz  . Drug use: No  . Sexual activity: Not on file  Other Topics Concern  . Not on file  Social History Narrative  . Not on file    Allergies  Allergen Reactions  . Bee Venom Anaphylaxis, Swelling and Other (See Comments)    Anaphylactic shock  . Penicillins Anaphylaxis and Swelling    Airway closes up Has patient had a PCN reaction causing immediate rash, facial/tongue/throat swelling, SOB or lightheadedness with hypotension: Yes Has patient had a PCN reaction causing severe rash involving mucus membranes or skin necrosis: Yes Has patient had a PCN  reaction that required hospitalization: [name] Has patient had a PCN reaction occurring within the last 10 years: No If all of the above answers are "NO", then may proceed with Cephalosporin use.     Family History  Problem Relation Age of Onset  . Colon cancer Brother   . Heart disease Brother   . Heart disease Brother     Prior to Admission medications   Medication Sig Start Date End Date Taking? Authorizing Provider  ALPRAZolam [name]) 1 MG tablet Take 1 tablet (1 mg total) by mouth 3 (three) times daily as needed. Patient taking differently: Take 1 mg 3 (three) times daily as  needed by mouth for anxiety.  09/10/17  Yes Dettinger, Fransisca Kaufmann, MD  aspirin 81 MG tablet Take 81 mg by mouth daily.   Yes [provider]  atorvastatin (LIPITOR) 40 MG tablet Take 1 tablet (40 mg total) by mouth daily. 03/03/17  Yes Dettinger, Fransisca Kaufmann, MD  buPROPion (WELLBUTRIN XL) 150 MG 24 hr tablet Take 1 tablet (150 mg total) by mouth daily. 09/10/17  Yes Dettinger, Fransisca Kaufmann, MD  Cholecalciferol (VITAMIN D PO) Take 1 capsule daily by mouth.    Yes [provider]  clobetasol (TEMOVATE) 0.05 % external solution Apply 1 application topically 2 (two) times daily. Patient taking differently: Apply 1 application daily as needed topically.  10/07/16  Yes Dettinger, Fransisca Kaufmann, MD  Multiple Vitamins-Minerals (MENS 50+ MULTI VITAMIN/MIN PO) Take by mouth daily.   Yes [provider]  omega-3 acid ethyl esters (LOVAZA) 1 g capsule Take 1 capsule (1 g total) by mouth 2 (two) times daily. Patient taking differently: Take 1 capsule daily by mouth.  03/03/17  Yes Dettinger, Fransisca Kaufmann, MD    Physical Exam: Vitals:   09/23/17 1345 09/23/17 1400 09/23/17 1430 09/23/17 1500  BP: 134/71 136/81 132/85 (!) 138/91  Pulse: 70 71 74 75  Resp: 18 18 17 19   Temp:      TempSrc:      SpO2: 95%  95% 94%     General:  Appears slightly irritable but comfortable no acute distress Eyes:  PERRL, EOMI, normal lids, iris ENT:  grossly normal hearing, lips & tongue, mucous membranes of his mouth are pink but dry Neck:  no LAD, masses or thyromegaly Cardiovascular:  RRR, no m/r/g. No LE edema. Pedal pulses present and palpable Respiratory:  CTA bilaterally, no w/r/r. Normal respiratory effort. Abdomen:  soft, ntnd, obese positive bowel sounds no guarding or rebounding Skin:  no rash or induration seen on limited exam Musculoskeletal:  grossly normal tone BUE/BLE, good ROM, no bony abnormality Psychiatric:  grossly normal mood and affect, speech fluent and appropriate, AOx3 Neurologic:  CN 2-12  grossly intact, moves all extremities in coordinated fashion, sensation intact speech clear facial symmetry  Labs on Admission: I have personally reviewed following labs and imaging studies  CBC: Recent Labs  Lab 09/23/17 1240  WBC 8.6  HGB 18.7*  HCT 53.1*  MCV 89.5  PLT 027   Basic Metabolic Panel: Recent Labs  Lab 09/23/17 1240  NA 137  K 4.0  CL 101  CO2 27  GLUCOSE 105*  BUN 14  CREATININE 0.93  CALCIUM 9.3   GFR: Estimated Creatinine Clearance: 112.9 mL/min (by C-G formula based on SCr of 0.93 mg/dL). Liver Function Tests: No results for input(s): AST, ALT, ALKPHOS, BILITOT, PROT, ALBUMIN in the last 168 hours. No results for input(s): LIPASE, AMYLASE in the last 168 hours. No results for  input(s): AMMONIA in the last 168 hours. Coagulation Profile: No results for input(s): INR, PROTIME in the last 168 hours. Cardiac Enzymes: No results for input(s): CKTOTAL, CKMB, CKMBINDEX, TROPONINI in the last 168 hours. BNP (last 3 results) No results for input(s): PROBNP in the last 8760 hours. HbA1C: No results for input(s): HGBA1C in the last 72 hours. CBG: No results for input(s): GLUCAP in the last 168 hours. Lipid Profile: No results for input(s): CHOL, HDL, LDLCALC, TRIG, CHOLHDL, LDLDIRECT in the last 72 hours. Thyroid Function Tests: No results for input(s): TSH, T4TOTAL, FREET4, T3FREE, THYROIDAB in the last 72 hours. Anemia Panel: No results for input(s): VITAMINB12, FOLATE, FERRITIN, TIBC, IRON, RETICCTPCT in the last 72 hours. Urine analysis:    Component Value Date/Time   BILIRUBINUR NEG 04/19/2014 0953   PROTEINUR 4+ 04/19/2014 0953   UROBILINOGEN negative 04/19/2014 0953   NITRITE NEG 04/19/2014 0953   LEUKOCYTESUR Negative 04/19/2014 0953    Creatinine Clearance: Estimated Creatinine Clearance: 112.9 mL/min (by C-G formula based on SCr of 0.93 mg/dL).  Sepsis Labs: @LABRCNTIP (procalcitonin:4,lacticidven:4) )No results found for this or any  previous visit (from the past 240 hour(s)).   Radiological Exams on Admission: Dg Chest 2 View  Result Date: 09/23/2017 CLINICAL DATA:  Chest pressure.  Hypertension. EXAM: CHEST  2 VIEW COMPARISON:  None. FINDINGS: Minimal opacity in left base. The heart, hila, mediastinum, lungs, and pleura are otherwise unremarkable IMPRESSION: Minimal opacity in left base could represent atelectasis or early infiltrate. Given history, I suspect atelectasis. Recommend follow-up to resolution. Electronically Signed   By: Dorise Bullion III M.D   On: 09/23/2017 13:17    EKG: Independently reviewed. Normal sinus rhythm Low voltage QRS Septal infarct , age undetermined  Assessment/Plan Principal Problem:   Chest pain Active Problems:   GAD (generalized anxiety disorder)   Hyperlipidemia   BMI 39.0-39.9,adult   HTN (hypertension)   Tobacco use   #1. Chest pain. Some typical and atypical features heart score 5. Patient with strong family history for CAD, he is a smoker he is obese has hyperlipidemia. Initial troponin negative. EKG as noted above. Patient is chest pain-free at the time of admission -Admit to telemetry -Serial EKGs -Cycle troponin -lipid panel -GI cocktail -Continue aspirin and statin -Likely benefit from a stress test however may be able to do it outpatient -cards consult   #2. Hypertension. Blood pressure high end of normal. Patient reports no prior diagnosis of hypertension. -Monitor -When necessary hydralazine  #3. Generalized anxiety disorder. Appears stable at baseline. -Continue home meds  #4. Tobacco use. -Cessation counseling offered  #5. Obesity. BMI 39.9 -Nutritional consult   DVT prophylaxis: heparin  Code Status: full  Family Communication: wife at bedside  Disposition Plan: home  Consults called: cardmaster  Admission status: obs    Dyanne Carrel M MD [facility]  If 7PM-7AM, please contact night-coverage [email or web address] Password  [email or web address]  09/23/2017, 4:11 PM

## 2017-09-23 NOTE — ED Triage Notes (Signed)
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  EMS came out and bp 170/102. This episode lasted at least 30-40 minutes and eased up. Pressure is still there. Pt states he was told this am he was cold and clammy.  He chewed 2 (325mg ) ASA this am.

## 2017-09-23 NOTE — Consult Note (Signed)
Cardiology Consultation:   Patient ID: Francisco Allen; [id number]; May 23, 1961   Admit date: 09/23/2017 Date of Consult: 09/23/2017  Primary Care Provider: No primary care provider on file. Primary Cardiologist: Jenkins Rouge, MD NEW  Primary Electrophysiologist:  NA   Patient Profile:   DEMITRIS POKORNY is a 56 y.o. male with a hx of tobacco use, HLD, obesity who is being seen today for the evaluation of chest pain at the request of Dr. Manuella Ghazi.  History of Present Illness:   Mr. Hermiz has a hx of tobacco use, HLD, obesity and presented to ER today for chest pain.  Today pt developed sudden midsternal chest pain a pressure that did not radiate.  He did have blurred vision. The blurred vision cleared before the chest pain.  Pain at greatest was 7/10 when arrived at [facility] 2/10.  Now resolved.  He was given ASA . BP per RN at work was elevated. Pain lasted 30 min.  He did have a stress test but over 10 years ago.   He has had occ chest pain but usually resolved with belching.  This was different today  EKG:  The EKG was personally reviewed and demonstrates:  SR with Q waves in V 1-2. Telemetry:  Telemetry was personally reviewed and demonstrates:  SR  Na 137, K+ 4.0, Cr 0.93  Hgb 18.7, Hct 53, WBC 8.6  Troponin 0.00  CXR 2 view IMPRESSION: Minimal opacity in left base could represent atelectasis or early, infiltrate. Given history, I suspect atelectasis. Recommend follow-up to resolution.   Currently no chest pain.  No SOB he smokes 1 ppd -he did stop for 12 years but with stress he went back to smoking 3 years ago.  + FH CAD wit 3 brothers with CABG and 1 sister died in early 17s with sudden death in her sleep.  Thought to be heart attack.  He is on Lovaza and lipitor for chol.  Past Medical History:  Diagnosis Date  . Colon polyp   . Diverticulitis   . Hyperlipidemia     Past Surgical History:  Procedure Laterality Date  . COLONOSCOPY    . CYST EXCISION  2004   Roof of mouth  .  SHOULDER SURGERY Right      Home Medications:  Prior to Admission medications   Medication Sig Start Date End Date Taking? Authorizing Provider  ALPRAZolam [name]) 1 MG tablet Take 1 tablet (1 mg total) by mouth 3 (three) times daily as needed. Patient taking differently: Take 1 mg 3 (three) times daily as needed by mouth for anxiety.  09/10/17  Yes Dettinger, Fransisca Kaufmann, MD  aspirin 81 MG tablet Take 81 mg by mouth daily.   Yes [provider]  atorvastatin (LIPITOR) 40 MG tablet Take 1 tablet (40 mg total) by mouth daily. 03/03/17  Yes Dettinger, Fransisca Kaufmann, MD  buPROPion (WELLBUTRIN XL) 150 MG 24 hr tablet Take 1 tablet (150 mg total) by mouth daily. 09/10/17  Yes Dettinger, Fransisca Kaufmann, MD  Cholecalciferol (VITAMIN D PO) Take 1 capsule daily by mouth.    Yes [provider]  clobetasol (TEMOVATE) 0.05 % external solution Apply 1 application topically 2 (two) times daily. Patient taking differently: Apply 1 application daily as needed topically.  10/07/16  Yes Dettinger, Fransisca Kaufmann, MD  Multiple Vitamins-Minerals (MENS 50+ MULTI VITAMIN/MIN PO) Take by mouth daily.   Yes [provider]  omega-3 acid ethyl esters (LOVAZA) 1 g capsule Take 1 capsule (1 g total) by mouth  2 (two) times daily. Patient taking differently: Take 1 capsule daily by mouth.  03/03/17  Yes Dettinger, Fransisca Kaufmann, MD    Inpatient Medications: Scheduled Meds: . [START ON 09/24/2017] aspirin EC  325 mg Oral Daily  . atorvastatin  40 mg Oral Daily  . buPROPion  150 mg Oral Daily  . enoxaparin (LOVENOX) injection  40 mg Subcutaneous Q24H   Continuous Infusions: . sodium chloride     PRN Meds: acetaminophen, ALPRAZolam, gi cocktail, hydrALAZINE, morphine injection, ondansetron (ZOFRAN) IV  Allergies:    Allergies  Allergen Reactions  . Bee Venom Anaphylaxis, Swelling and Other (See Comments)    Anaphylactic shock  . Penicillins Anaphylaxis and Swelling    Airway closes up Has patient had a PCN  reaction causing immediate rash, facial/tongue/throat swelling, SOB or lightheadedness with hypotension: Yes Has patient had a PCN reaction causing severe rash involving mucus membranes or skin necrosis: Yes Has patient had a PCN reaction that required hospitalization: [name] Has patient had a PCN reaction occurring within the last 10 years: No If all of the above answers are "NO", then may proceed with Cephalosporin use.     Social History:   Social History   Socioeconomic History  . Marital status: Married    Spouse name: Not on file  . Number of children: Not on file  . Years of education: Not on file  . Highest education level: Not on file  Social Needs  . Financial resource strain: Not on file  . Food insecurity - worry: Not on file  . Food insecurity - inability: Not on file  . Transportation needs - medical: Not on file  . Transportation needs - non-medical: Not on file  Occupational History  . Not on file  Tobacco Use  . Smoking status: Current Every Day Smoker    Packs/day: 1.00    Years: 28.00    Pack years: 28.00    Types: Cigarettes  . Smokeless tobacco: Never Used  Substance and Sexual Activity  . Alcohol use: No    Alcohol/week: 0.0 oz  . Drug use: No  . Sexual activity: Not on file  Other Topics Concern  . Not on file  Social History Narrative  . Not on file    Family History:    Family History  Problem Relation Age of Onset  . Colon cancer Brother   . Heart disease Brother   . Heart disease Brother      ROS:  Please see the history of present illness.  ROS  General:no colds or fevers, no weight changes Skin:no rashes or ulcers HEENT:+ blurred vision today, never had this before, does wear glasses ,no congestion CV:see HPI PUL:see HPI GI:no diarrhea constipation or melena, no indigestion GU:no hematuria, no dysuria MS:+ joint pain, no claudication Neuro:no syncope, no lightheadedness Endo:no diabetes, no thyroid disease      Physical  Exam/Data:   Vitals:   09/23/17 1345 09/23/17 1400 09/23/17 1430 09/23/17 1500  BP: 134/71 136/81 132/85 (!) 138/91  Pulse: 70 71 74 75  Resp: 18 18 17 19   Temp:      TempSrc:      SpO2: 95%  95% 94%   No intake or output data in the 24 hours ending 09/23/17 1624 There were no vitals filed for this visit. There is no height or weight on file to calculate BMI.  General:  Well nourished, well developed, in no acute distress  HEENT: normal Lymph: no adenopathy Neck: no JVD Endocrine:  No thryomegaly Vascular: No carotid bruits; 1+ pedal pulses bil. Cardiac:  normal S1, S2; RRR; no murmur gallup rub or click Lungs:  clear to auscultation bilaterally, no wheezing, rhonchi or rales  Abd: soft, nontender, no hepatomegaly  Ext: no edema Musculoskeletal:  No deformities, BUE and BLE strength normal and equal Skin: warm and dry  Neuro:  Alert and oriented X 3 MAE, follows commands,+ facial symmetry, no focal abnormalities noted Psych:  Normal affect   Relevant CV Studies: None   Laboratory Data:  Chemistry Recent Labs  Lab 09/23/17 1240  NA 137  K 4.0  CL 101  CO2 27  GLUCOSE 105*  BUN 14  CREATININE 0.93  CALCIUM 9.3  GFRNONAA >60  GFRAA >60  ANIONGAP 9    No results for input(s): PROT, ALBUMIN, AST, ALT, ALKPHOS, BILITOT in the last 168 hours. Hematology Recent Labs  Lab 09/23/17 1240  WBC 8.6  RBC 5.93*  HGB 18.7*  HCT 53.1*  MCV 89.5  MCH 31.5  MCHC 35.2  RDW 13.7  PLT 166   Cardiac EnzymesNo results for input(s): TROPONINI in the last 168 hours.  Recent Labs  Lab 09/23/17 1444  TROPIPOC 0.00    BNPNo results for input(s): BNP, PROBNP in the last 168 hours.  DDimer No results for input(s): DDIMER in the last 168 hours.  Radiology/Studies:  Dg Chest 2 View  Result Date: 09/23/2017 CLINICAL DATA:  Chest pressure.  Hypertension. EXAM: CHEST  2 VIEW COMPARISON:  None. FINDINGS: Minimal opacity in left base. The heart, hila, mediastinum, lungs, and  pleura are otherwise unremarkable IMPRESSION: Minimal opacity in left base could represent atelectasis or early infiltrate. Given history, I suspect atelectasis. Recommend follow-up to resolution. Electronically Signed   By: Dorise Bullion III M.D   On: 09/23/2017 13:17    Assessment and Plan:   Chest pain neg troponin agree, with serial troponins possible with strong FH of CAD and his size cardiac cath will give best information to treatment.  Will allow liquids in AM cath currently scheduled for 6 pm.  If troponin + then add IV heparin  HTN  BP 150/104 initially.  Add BB for now lopressor 25 BID, check echo  Hx HLD continue lipitor and lovaza , check lipid panel.  Tobacco use, he has stopped before - he knows he is better off stopping again. He hopes to stop cold [place].      For questions or updates, please contact [place] Please consult [email or web address] for contact info under Cardiology/STEMI.   Signed, Cecilie Kicks, NP  09/23/2017 4:24 PM   Patient examined chart reviewed. Obese white male smoker. Markedly positive family history New onset SSCP suspicious for angina No ECG changes POC troponin negative. CRF;'s family history HTN and smoking Exam with obesity no active wheezing normal CXR good right radial pulse distant heart sounds. Discussed options with patient due to body habitus non invasive testing has higher likelihood of false positive Prefer diagnostic heart cath Risks including stroke MI, bleeding contrast reaction and need for emergency surgery discussed willing to proceed Lab called orders written. Late case will give clear liquid breakfast. Add lopressor for BP Discussed smoking cessation   Jenkins Rouge

## 2017-09-24 ENCOUNTER — Observation Stay (HOSPITAL_BASED_OUTPATIENT_CLINIC_OR_DEPARTMENT_OTHER): Payer: BLUE CROSS/BLUE SHIELD

## 2017-09-24 ENCOUNTER — Other Ambulatory Visit: Payer: Self-pay | Admitting: *Deleted

## 2017-09-24 ENCOUNTER — Encounter (HOSPITAL_COMMUNITY)
Admission: EM | Disposition: A | Discharge status: Home / Self Care | Payer: Self-pay | Source: Home / Self Care | Attending: Physician Assistant

## 2017-09-24 DIAGNOSIS — I10 Essential (primary) hypertension: Secondary | ICD-10-CM | POA: Diagnosis not present

## 2017-09-24 DIAGNOSIS — H538 Other visual disturbances: Secondary | ICD-10-CM | POA: Diagnosis not present

## 2017-09-24 DIAGNOSIS — Z79899 Other long term (current) drug therapy: Secondary | ICD-10-CM | POA: Diagnosis not present

## 2017-09-24 DIAGNOSIS — Z720 Tobacco use: Secondary | ICD-10-CM | POA: Diagnosis not present

## 2017-09-24 DIAGNOSIS — I200 Unstable angina: Secondary | ICD-10-CM | POA: Diagnosis not present

## 2017-09-24 DIAGNOSIS — R0789 Other chest pain: Secondary | ICD-10-CM | POA: Diagnosis not present

## 2017-09-24 DIAGNOSIS — E785 Hyperlipidemia, unspecified: Secondary | ICD-10-CM

## 2017-09-24 DIAGNOSIS — R079 Chest pain, unspecified: Secondary | ICD-10-CM

## 2017-09-24 HISTORY — PX: LEFT HEART CATH AND CORONARY ANGIOGRAPHY: CATH118249

## 2017-09-24 LAB — ECHOCARDIOGRAM COMPLETE
CHL CUP DOP CALC LVOT VTI: 29.2 cm
EERAT: 10.17
EWDT: 257 ms
FS: 31 % (ref 28–44)
Height: 70 in
IVS/LV PW RATIO, ED: 1
LA vol index: 21.3 mL/m2
LA vol: 50 mL
LADIAMINDEX: 1.45 cm/m2
LASIZE: 34 mm
LAVOLA4C: 54.5 mL
LDCA: 2.84 cm2
LEFT ATRIUM END SYS DIAM: 34 mm
LV E/e' medial: 10.17
LV E/e'average: 10.17
LV SIMPSON'S DISK: 65
LV dias vol index: 39 mL/m2
LV dias vol: 92 mL (ref 62–150)
LV e' LATERAL: 9.69 cm/s
LV sys vol index: 14 mL/m2
LVOT SV: 83 mL
LVOT diameter: 19 mm
LVOT peak grad rest: 6 mmHg
LVOT peak vel: 123 cm/s
LVSYSVOL: 33 mL (ref 21–61)
MV Dec: 257
MV Peak grad: 4 mmHg
MVAP: 2.93 cm2
MVPKAVEL: 101 m/s
MVPKEVEL: 98.5 m/s
P 1/2 time: 75 ms
PW: 14 mm — AB (ref 0.6–1.1)
RV TAPSE: 18.8 mm
Stroke v: 60 ml
TDI e' lateral: 9.69
TDI e' medial: 9.34
Weight: 4227.2 oz

## 2017-09-24 LAB — BASIC METABOLIC PANEL
Anion gap: 9 (ref 5–15)
BUN: 14 mg/dL (ref 6–20)
CHLORIDE: 103 mmol/L (ref 101–111)
CO2: 24 mmol/L (ref 22–32)
CREATININE: 0.85 mg/dL (ref 0.61–1.24)
Calcium: 9 mg/dL (ref 8.9–10.3)
GFR calc non Af Amer: >60 mL/min (ref >60)
GLUCOSE: 117 mg/dL — AB (ref 65–99)
Potassium: 4.1 mmol/L (ref 3.5–5.1)
Sodium: 136 mmol/L (ref 135–145)

## 2017-09-24 LAB — CBC
HEMATOCRIT: 51.1 % (ref 39.0–52.0)
HEMOGLOBIN: 17.6 g/dL — AB (ref 13.0–17.0)
MCH: 30.8 pg (ref 26.0–34.0)
MCHC: 34.4 g/dL (ref 30.0–36.0)
MCV: 89.3 fL (ref 78.0–100.0)
Platelets: 142 10*3/uL — ABNORMAL LOW (ref 150–400)
RBC: 5.72 MIL/uL (ref 4.22–5.81)
RDW: 13.8 % (ref 11.5–15.5)
WBC: 7.1 10*3/uL (ref 4.0–10.5)

## 2017-09-24 LAB — TROPONIN I: Troponin I: <0.03 ng/mL (ref <0.03)

## 2017-09-24 LAB — PROTIME-INR
INR: 0.99
Prothrombin Time: 13 seconds (ref 11.4–15.2)

## 2017-09-24 LAB — HIV ANTIBODY (ROUTINE TESTING W REFLEX): HIV Screen 4th Generation wRfx: NONREACTIVE

## 2017-09-24 SURGERY — LEFT HEART CATH AND CORONARY ANGIOGRAPHY
Anesthesia: LOCAL

## 2017-09-24 MED ORDER — ACETAMINOPHEN 325 MG PO TABS: 650.0000 mg | ORAL_TABLET | ORAL | Status: DC | PRN

## 2017-09-24 MED ORDER — ONDANSETRON HCL 4 MG/2ML IJ SOLN: 4.0000 mg | Freq: Four times a day (QID) | INTRAMUSCULAR | Status: DC | PRN

## 2017-09-24 MED ORDER — IOPAMIDOL (ISOVUE-370) INJECTION 76%
INTRAVENOUS | Status: DC | PRN
  Administered 2017-09-24: 60 mL via INTRAVENOUS

## 2017-09-24 MED ORDER — HEPARIN (PORCINE) IN NACL 2-0.9 UNIT/ML-% IJ SOLN
INTRAMUSCULAR | Status: AC | PRN
  Administered 2017-09-24: 1000 mL

## 2017-09-24 MED ORDER — ASPIRIN 325 MG PO TBEC: 325.0000 mg | DELAYED_RELEASE_TABLET | Freq: Every day | ORAL | 0 refills | Status: DC

## 2017-09-24 MED ORDER — VERAPAMIL HCL 2.5 MG/ML IV SOLN
INTRAVENOUS | Status: DC | PRN
  Administered 2017-09-24: 10 mL via INTRA_ARTERIAL

## 2017-09-24 MED ORDER — OMEGA-3-ACID ETHYL ESTERS 1 G PO CAPS
1.0000 | ORAL_CAPSULE | Freq: Two times a day (BID) | ORAL | Status: DC
  Administered 2017-09-24: 1 g via ORAL
  Filled 2017-09-24: qty 1

## 2017-09-24 MED ORDER — IOPAMIDOL (ISOVUE-370) INJECTION 76%
INTRAVENOUS | Status: AC
  Filled 2017-09-24: qty 100

## 2017-09-24 MED ORDER — MIDAZOLAM HCL 2 MG/2ML IJ SOLN
INTRAMUSCULAR | Status: AC
  Filled 2017-09-24: qty 2

## 2017-09-24 MED ORDER — METOPROLOL TARTRATE 25 MG PO TABS: 25.0000 mg | ORAL_TABLET | Freq: Two times a day (BID) | ORAL | 0 refills | Status: DC

## 2017-09-24 MED ORDER — LIDOCAINE HCL (PF) 1 % IJ SOLN
INTRAMUSCULAR | Status: DC | PRN
  Administered 2017-09-24: 2 mL

## 2017-09-24 MED ORDER — HEPARIN SODIUM (PORCINE) 1000 UNIT/ML IJ SOLN
INTRAMUSCULAR | Status: DC | PRN
  Administered 2017-09-24: 6000 [IU] via INTRAVENOUS

## 2017-09-24 MED ORDER — FENTANYL CITRATE (PF) 100 MCG/2ML IJ SOLN
INTRAMUSCULAR | Status: AC
  Filled 2017-09-24: qty 2

## 2017-09-24 MED ORDER — SODIUM CHLORIDE 0.9 % IV SOLN: 250.0000 mL | INTRAVENOUS | Status: DC | PRN

## 2017-09-24 MED ORDER — ATORVASTATIN CALCIUM 40 MG PO TABS: 40.0000 mg | ORAL_TABLET | Freq: Every day | ORAL | Status: DC

## 2017-09-24 MED ORDER — SODIUM CHLORIDE 0.9% FLUSH: 3.0000 mL | INTRAVENOUS | Status: DC | PRN

## 2017-09-24 MED ORDER — SODIUM CHLORIDE 0.9 % IV SOLN
INTRAVENOUS | Status: AC
  Administered 2017-09-24: 16:00:00 via INTRAVENOUS

## 2017-09-24 MED ORDER — HEPARIN (PORCINE) IN NACL 2-0.9 UNIT/ML-% IJ SOLN
INTRAMUSCULAR | Status: AC
  Filled 2017-09-24: qty 1000

## 2017-09-24 MED ORDER — OMEGA-3-ACID ETHYL ESTERS 1 G PO CAPS: 1.0000 | ORAL_CAPSULE | Freq: Two times a day (BID) | ORAL | 1 refills | Status: DC

## 2017-09-24 MED ORDER — FENTANYL CITRATE (PF) 100 MCG/2ML IJ SOLN
INTRAMUSCULAR | Status: DC | PRN
  Administered 2017-09-24: 25 ug via INTRAVENOUS

## 2017-09-24 MED ORDER — LIDOCAINE HCL (PF) 1 % IJ SOLN
INTRAMUSCULAR | Status: AC
  Filled 2017-09-24: qty 30

## 2017-09-24 MED ORDER — SODIUM CHLORIDE 0.9% FLUSH: 3.0000 mL | Freq: Two times a day (BID) | INTRAVENOUS | Status: DC

## 2017-09-24 MED ORDER — ATORVASTATIN CALCIUM 40 MG PO TABS: 40.0000 mg | ORAL_TABLET | Freq: Every day | ORAL | 1 refills | Status: DC

## 2017-09-24 MED ORDER — MIDAZOLAM HCL 2 MG/2ML IJ SOLN
INTRAMUSCULAR | Status: DC | PRN
  Administered 2017-09-24: 2 mg via INTRAVENOUS

## 2017-09-24 SURGICAL SUPPLY — 10 items
CATH INFINITI 5 FR JL3.5 (CATHETERS) ×1 IMPLANT
CATH INFINITI JR4 5F (CATHETERS) ×1 IMPLANT
DEVICE RAD COMP TR BAND LRG (VASCULAR PRODUCTS) ×1 IMPLANT
GLIDESHEATH SLEND SS 6F .021 (SHEATH) ×1 IMPLANT
GUIDEWIRE INQWIRE 1.5J.035X260 (WIRE) IMPLANT
INQWIRE 1.5J .035X260CM (WIRE) ×2
KIT HEART LEFT (KITS) ×2 IMPLANT
PACK CARDIAC CATHETERIZATION (CUSTOM PROCEDURE TRAY) ×2 IMPLANT
TRANSDUCER W/STOPCOCK (MISCELLANEOUS) ×2 IMPLANT
TUBING CIL FLEX 10 FLL-RA (TUBING) ×2 IMPLANT

## 2017-09-24 NOTE — H&P (View-Only) (Signed)
Progress Note  Patient Name: Francisco Allen Date of Encounter: 09/24/2017  Primary Cardiologist: Dr. Johnsie Cancel, new  Subjective   No chest pain or shortness of breath.   Inpatient Medications    Scheduled Meds: . aspirin EC  325 mg Oral Daily  . atorvastatin  40 mg Oral Daily  . buPROPion  150 mg Oral Daily  . enoxaparin (LOVENOX) injection  40 mg Subcutaneous Q24H  . metoprolol tartrate  25 mg Oral BID  . sodium chloride flush  3 mL Intravenous Q12H   Continuous Infusions: . sodium chloride    . sodium chloride    . sodium chloride 1 mL/kg/hr (09/24/17 0733)   PRN Meds: sodium chloride, acetaminophen, ALPRAZolam, gi cocktail, hydrALAZINE, morphine injection, ondansetron (ZOFRAN) IV, sodium chloride flush   Vital Signs    Vitals:   09/23/17 1730 09/23/17 1818 09/23/17 2144 09/24/17 0410  BP: (!) 143/95 (!) 157/85 (!) 134/92 (!) 121/91  Pulse: 83 75 87 68  Resp: (!) 21 18    Temp:  98.2 F (36.8 C) 98 F (36.7 C) (!) 97.3 F (36.3 C)  TempSrc:  Oral Oral Oral  SpO2: (!) 87% 96% 97% 95%  Weight:  265 lb 12.8 oz (120.6 kg)  264 lb 3.2 oz (119.8 kg)  Height:  5\' 10"  (1.778 m)      Intake/Output Summary (Last 24 hours) at 09/24/2017 0754 Last data filed at 09/24/2017 0653 Gross per 24 hour  Intake 524.58 ml  Output -  Net 524.58 ml   Filed Weights   09/23/17 1818 09/24/17 0410  Weight: 265 lb 12.8 oz (120.6 kg) 264 lb 3.2 oz (119.8 kg)    Telemetry    Sinus rhythm in the 70's-80's - Personally Reviewed  ECG    NSR with q waves V1-2, no new changes - Personally Reviewed  Physical Exam   GEN: No acute distress.   Neck: No JVD Cardiac: RRR, no murmurs, rubs, or gallops.  Respiratory: Clear to auscultation bilaterally, exp wheezes in upper lobes, cleared with cough.  GI: Soft, nontender, non-distended  MS: No edema; No deformity. Neuro:  Nonfocal  Psych: Normal affect   Labs    Chemistry Recent Labs  Lab 09/23/17 1240 09/24/17 0544  NA 137  136  K 4.0 4.1  CL 101 103  CO2 27 24  GLUCOSE 105* 117*  BUN 14 14  CREATININE 0.93 0.85  CALCIUM 9.3 9.0  GFRNONAA >60 >60  GFRAA >60 >60  ANIONGAP 9 9     Hematology Recent Labs  Lab 09/23/17 1240 09/24/17 0544  WBC 8.6 7.1  RBC 5.93* 5.72  HGB 18.7* 17.6*  HCT 53.1* 51.1  MCV 89.5 89.3  MCH 31.5 30.8  MCHC 35.2 34.4  RDW 13.7 13.8  PLT 166 142*    Cardiac Enzymes Recent Labs  Lab 09/23/17 1636 09/23/17 2219  TROPONINI <0.03 <0.03    Recent Labs  Lab 09/23/17 1444  TROPIPOC 0.00     BNPNo results for input(s): BNP, PROBNP in the last 168 hours.   DDimer No results for input(s): DDIMER in the last 168 hours.   Radiology    Dg Chest 2 View  Result Date: 09/23/2017 CLINICAL DATA:  Chest pressure.  Hypertension. EXAM: CHEST  2 VIEW COMPARISON:  None. FINDINGS: Minimal opacity in left base. The heart, hila, mediastinum, lungs, and pleura are otherwise unremarkable IMPRESSION: Minimal opacity in left base could represent atelectasis or early infiltrate. Given history, I suspect atelectasis. Recommend follow-up to  resolution. Electronically Signed   By: Dorise Bullion III M.D   On: 09/23/2017 13:17    Cardiac Studies   Echo pending LHC pending  Patient Profile     56 y.o. male  with a hx of tobacco use, HLD, obesity, strong family hx of CAD who is being seen for the evaluation of chest pain. Troponins negative X3. EKG without acute changes.   Assessment & Plan    Chest pain: Troponins continue to be negative. Strong family hx of CAD and large body habitus. No chest pain over night. Plan for diagnostic heart cath today for difinitive coronary evaluation.   Hypertension: Blood pressure has been elevated. metoprolol added yesterday, SBP improved, DBP still in the 80-90's. Continue to monitor. Echo pending.   Hyperlipidemia: LDL 138 on lipitor 40 mg and lovaza. Goal LDLto be based on upcoming cath and presence of CAD.   Tobacco use: Pt has confidence  that he will be able to stop. He has stopped for 12 years in the past.   For questions or updates, please contact [place] Please consult [email or web address] for contact info under Cardiology/STEMI.      Signed, Daune Perch, NP  09/24/2017, 7:54 AM    Patient examined chart reviewed Obese bronchitic male no murmur lungs exp wheezing good radial pulse. For cath today given chest pain and markedly positive family history BP better with beta blocker. All questions answered  Jenkins Rouge

## 2017-09-24 NOTE — Progress Notes (Signed)
Progress Note  Patient Name: Francisco Allen Date of Encounter: 09/24/2017  Primary Cardiologist: Dr. Johnsie Cancel, new  Subjective   No chest pain or shortness of breath.   Inpatient Medications    Scheduled Meds: . aspirin EC  325 mg Oral Daily  . atorvastatin  40 mg Oral Daily  . buPROPion  150 mg Oral Daily  . enoxaparin (LOVENOX) injection  40 mg Subcutaneous Q24H  . metoprolol tartrate  25 mg Oral BID  . sodium chloride flush  3 mL Intravenous Q12H   Continuous Infusions: . sodium chloride    . sodium chloride    . sodium chloride 1 mL/kg/hr (09/24/17 0733)   PRN Meds: sodium chloride, acetaminophen, ALPRAZolam, gi cocktail, hydrALAZINE, morphine injection, ondansetron (ZOFRAN) IV, sodium chloride flush   Vital Signs    Vitals:   09/23/17 1730 09/23/17 1818 09/23/17 2144 09/24/17 0410  BP: (!) 143/95 (!) 157/85 (!) 134/92 (!) 121/91  Pulse: 83 75 87 68  Resp: (!) 21 18    Temp:  98.2 F (36.8 C) 98 F (36.7 C) (!) 97.3 F (36.3 C)  TempSrc:  Oral Oral Oral  SpO2: (!) 87% 96% 97% 95%  Weight:  265 lb 12.8 oz (120.6 kg)  264 lb 3.2 oz (119.8 kg)  Height:  5\' 10"  (1.778 m)      Intake/Output Summary (Last 24 hours) at 09/24/2017 0754 Last data filed at 09/24/2017 0653 Gross per 24 hour  Intake 524.58 ml  Output -  Net 524.58 ml   Filed Weights   09/23/17 1818 09/24/17 0410  Weight: 265 lb 12.8 oz (120.6 kg) 264 lb 3.2 oz (119.8 kg)    Telemetry    Sinus rhythm in the 70's-80's - Personally Reviewed  ECG    NSR with q waves V1-2, no new changes - Personally Reviewed  Physical Exam   GEN: No acute distress.   Neck: No JVD Cardiac: RRR, no murmurs, rubs, or gallops.  Respiratory: Clear to auscultation bilaterally, exp wheezes in upper lobes, cleared with cough.  GI: Soft, nontender, non-distended  MS: No edema; No deformity. Neuro:  Nonfocal  Psych: Normal affect   Labs    Chemistry Recent Labs  Lab 09/23/17 1240 09/24/17 0544  NA 137  136  K 4.0 4.1  CL 101 103  CO2 27 24  GLUCOSE 105* 117*  BUN 14 14  CREATININE 0.93 0.85  CALCIUM 9.3 9.0  GFRNONAA >60 >60  GFRAA >60 >60  ANIONGAP 9 9     Hematology Recent Labs  Lab 09/23/17 1240 09/24/17 0544  WBC 8.6 7.1  RBC 5.93* 5.72  HGB 18.7* 17.6*  HCT 53.1* 51.1  MCV 89.5 89.3  MCH 31.5 30.8  MCHC 35.2 34.4  RDW 13.7 13.8  PLT 166 142*    Cardiac Enzymes Recent Labs  Lab 09/23/17 1636 09/23/17 2219  TROPONINI <0.03 <0.03    Recent Labs  Lab 09/23/17 1444  TROPIPOC 0.00     BNPNo results for input(s): BNP, PROBNP in the last 168 hours.   DDimer No results for input(s): DDIMER in the last 168 hours.   Radiology    Dg Chest 2 View  Result Date: 09/23/2017 CLINICAL DATA:  Chest pressure.  Hypertension. EXAM: CHEST  2 VIEW COMPARISON:  None. FINDINGS: Minimal opacity in left base. The heart, hila, mediastinum, lungs, and pleura are otherwise unremarkable IMPRESSION: Minimal opacity in left base could represent atelectasis or early infiltrate. Given history, I suspect atelectasis. Recommend follow-up to  resolution. Electronically Signed   By: Dorise Bullion III M.D   On: 09/23/2017 13:17    Cardiac Studies   Echo pending LHC pending  Patient Profile     56 y.o. male  with a hx of tobacco use, HLD, obesity, strong family hx of CAD who is being seen for the evaluation of chest pain. Troponins negative X3. EKG without acute changes.   Assessment & Plan    Chest pain: Troponins continue to be negative. Strong family hx of CAD and large body habitus. No chest pain over night. Plan for diagnostic heart cath today for difinitive coronary evaluation.   Hypertension: Blood pressure has been elevated. metoprolol added yesterday, SBP improved, DBP still in the 80-90's. Continue to monitor. Echo pending.   Hyperlipidemia: LDL 138 on lipitor 40 mg and lovaza. Goal LDLto be based on upcoming cath and presence of CAD.   Tobacco use: Pt has confidence  that he will be able to stop. He has stopped for 12 years in the past.   For questions or updates, please contact [place] Please consult [email or web address] for contact info under Cardiology/STEMI.      Signed, Daune Perch, NP  09/24/2017, 7:54 AM    Patient examined chart reviewed Obese bronchitic male no murmur lungs exp wheezing good radial pulse. For cath today given chest pain and markedly positive family history BP better with beta blocker. All questions answered  Jenkins Rouge

## 2017-09-24 NOTE — Interval H&P Note (Signed)
Cath Lab Visit (complete for each Cath Lab visit)  Clinical Evaluation Leading to the Procedure:   ACS: Yes.    Non-ACS:    Anginal Classification: CCS IV  Anti-ischemic medical therapy: Minimal Therapy (1 class of medications)  Non-Invasive Test Results: No non-invasive testing performed  Prior CABG: No previous CABG      History and Physical Interval Note:  09/24/2017 2:07 PM  Francisco Allen  has presented today for surgery, with the diagnosis of cp  The various methods of treatment have been discussed with the patient and family. After consideration of risks, benefits and other options for treatment, the patient has consented to  Procedure(s): LEFT HEART CATH AND CORONARY ANGIOGRAPHY (N/A) as a surgical intervention .  The patient's history has been reviewed, patient examined, no change in status, stable for surgery.  I have reviewed the patient's chart and labs.  Questions were answered to the patient's satisfaction.     Larae Grooms

## 2017-09-24 NOTE — Discharge Summary (Signed)
Physician Discharge Summary  Patient ID: NASIRE REALI MRN: [id number] DOB/AGE: [birth year] 56 y.o.  Admit date: 09/23/2017 Discharge date: 09/24/2017  Admission Diagnoses:  Discharge Diagnoses:  Principal Problem:   Chest pain Active Problems:   GAD (generalized anxiety disorder)   Hyperlipidemia   BMI 39.0-39.9,adult   HTN (hypertension)   Tobacco use   Discharged Condition: Stable  Hospital Course: Patient is a 56 year old male with past medical history significant for tobacco use, hyperlipidemia, obesity and tobacco use. Patient was admitted with chest pain that was concerning, though, the cardiac enzymes were non revealing. Cardiology was consulted and the decision was made to proceed with cardiac catheterization. Cardiac catheterization did not reveal significant obstructive coronary artery lesion. Patient has been cleared for discharge by the Cardiology team.    Consults: cardiology  Significant Diagnostic Studies: Cardiac catheterization  Treatments:    Discharge Exam: Blood pressure 124/88, pulse 65, temperature (!) 97.3 F (36.3 C), temperature source Oral, resp. rate 10, height 5\' 10"  (1.778 m), weight 119.8 kg (264 lb 3.2 oz), SpO2 97 %.    Disposition:   Discharge Instructions    Call MD for:   Complete by:  As directed    Call MD for:  severe uncontrolled pain   Complete by:  As directed    Diet - low sodium heart healthy   Complete by:  As directed    Discharge instructions   Complete by:  As directed    Follow up with PCP and Cardiology within 1-2 weeks. Quit tobacco use please. Out patient sleep studies   Increase activity slowly   Complete by:  As directed      Allergies as of 09/24/2017      Reactions   Bee Venom Anaphylaxis, Swelling, Other (See Comments)   Anaphylactic shock   Penicillins Anaphylaxis, Swelling   Airway closes up Has patient had a PCN reaction causing immediate rash, facial/tongue/throat swelling, SOB or lightheadedness  with hypotension: Yes Has patient had a PCN reaction causing severe rash involving mucus membranes or skin necrosis: Yes Has patient had a PCN reaction that required hospitalization: [name] Has patient had a PCN reaction occurring within the last 10 years: No If all of the above answers are "NO", then may proceed with Cephalosporin use.      Medication List    STOP taking these medications   aspirin 81 MG tablet Replaced by:  aspirin 325 MG EC tablet     TAKE these medications   ALPRAZolam 1 MG tablet Commonly known as:  XANAX Take 1 tablet (1 mg total) by mouth 3 (three) times daily as needed. What changed:  reasons to take this   aspirin 325 MG EC tablet Take 1 tablet (325 mg total) daily by mouth. Start taking on:  09/25/2017 Replaces:  aspirin 81 MG tablet   atorvastatin 40 MG tablet Commonly known as:  LIPITOR Take 1 tablet (40 mg total) daily by mouth.   buPROPion 150 MG 24 hr tablet Commonly known as:  WELLBUTRIN XL Take 1 tablet (150 mg total) by mouth daily.   clobetasol 0.05 % external solution Commonly known as:  TEMOVATE Apply 1 application topically 2 (two) times daily. What changed:    when to take this  reasons to take this   MENS 50+ MULTI VITAMIN/MIN PO Take by mouth daily.   metoprolol tartrate 25 MG tablet Commonly known as:  LOPRESSOR Take 1 tablet (25 mg total) 2 (two) times daily by mouth.   omega-3  acid ethyl esters 1 g capsule Commonly known as:  LOVAZA Take 1 capsule (1 g total) 2 (two) times daily by mouth. What changed:  when to take this   VITAMIN D PO Take 1 capsule daily by mouth.        SignedBonnell Public 09/24/2017, 5:06 PM

## 2017-09-24 NOTE — Progress Notes (Signed)
  Echocardiogram 2D Echocardiogram has been performed.  Francisco Allen 09/24/2017, 10:48 AM

## 2017-09-27 ENCOUNTER — Encounter (HOSPITAL_COMMUNITY): Payer: Self-pay | Admitting: Interventional Cardiology

## 2017-09-28 DIAGNOSIS — Z029 Encounter for administrative examinations, unspecified: Secondary | ICD-10-CM

## 2017-10-05 ENCOUNTER — Telehealth: Payer: Self-pay

## 2017-10-05 MED ORDER — ICOSAPENT ETHYL 1 G PO CAPS: 2.0000 g | ORAL_CAPSULE | Freq: Two times a day (BID) | ORAL | 1 refills | Status: DC

## 2017-10-05 NOTE — Telephone Encounter (Signed)
Dr Buckner [place] patient  Insurance denied prior auth for Omega 3   Must try and fail Vascepa

## 2017-10-05 NOTE — Telephone Encounter (Signed)
Vascepa Prescription sent to pharmacy. Stop Lovaza.

## 2017-10-05 NOTE — Addendum Note (Signed)
Addended by: Evelina Dun A on: 10/05/2017 02:24 PM   Modules accepted: Orders

## 2017-10-19 ENCOUNTER — Ambulatory Visit: Payer: Self-pay | Admitting: Family Medicine

## 2017-10-27 ENCOUNTER — Encounter: Payer: Self-pay | Admitting: Family Medicine

## 2017-10-27 ENCOUNTER — Ambulatory Visit (INDEPENDENT_AMBULATORY_CARE_PROVIDER_SITE_OTHER): Payer: BLUE CROSS/BLUE SHIELD | Admitting: Family Medicine

## 2017-10-27 VITALS — BP 130/81 | HR 61 | Temp 97.8°F | Ht 70.0 in | Wt 267.0 lb

## 2017-10-27 DIAGNOSIS — F411 Generalized anxiety disorder: Secondary | ICD-10-CM | POA: Diagnosis not present

## 2017-10-27 DIAGNOSIS — E785 Hyperlipidemia, unspecified: Secondary | ICD-10-CM

## 2017-10-27 DIAGNOSIS — I25118 Atherosclerotic heart disease of native coronary artery with other forms of angina pectoris: Secondary | ICD-10-CM

## 2017-10-27 MED ORDER — METOPROLOL TARTRATE 25 MG PO TABS: 25.0000 mg | ORAL_TABLET | Freq: Two times a day (BID) | ORAL | 0 refills | Status: DC

## 2017-10-27 MED ORDER — ROSUVASTATIN CALCIUM 40 MG PO TABS: 40.0000 mg | ORAL_TABLET | Freq: Every day | ORAL | 3 refills | Status: DC

## 2017-10-27 MED ORDER — BUPROPION HCL ER (XL) 300 MG PO TB24: 300.0000 mg | ORAL_TABLET | Freq: Every day | ORAL | 3 refills | Status: DC

## 2017-10-27 MED ORDER — ICOSAPENT ETHYL 1 G PO CAPS: 2.0000 g | ORAL_CAPSULE | Freq: Two times a day (BID) | ORAL | 11 refills | Status: DC

## 2017-10-27 MED ORDER — CLOBETASOL PROPIONATE 0.05 % EX SOLN: 1.0000 "application " | Freq: Two times a day (BID) | CUTANEOUS | 6 refills | Status: DC

## 2017-10-27 NOTE — Progress Notes (Signed)
BP 130/81   Pulse 61   Temp 97.8 F (36.6 C) (Oral)   Ht 5\' 10"  (1.778 m)   Wt 267 lb (121.1 kg)   BMI 38.31 kg/m    Subjective:    Patient ID: Francisco Allen, male    DOB: 04/27/1961, 56 y.o.   MRN: [id number]  HPI: Francisco Allen is a 56 y.o. male presenting on 10/27/2017 for Hospitalization Follow-up (insurance will no longer pay for Lovaza, preferred is Vascepa) and Medication Refill (discuss Metoprolol, )   HPI Hospital follow-up for CAD Patient was in the hospital for CAD with symptoms of angina and was catheterized during the hospitalization.  The catheterization resulted no stents needed and all blockages were less than 50% but per cardiology was recommended to transfer on some different medications 1 to control anxiety better and to to control risk factors with cholesterol medications.  Patient denies any further chest pain or shortness of breath since leaving the hospital.  Hyperlipidemia Patient is coming in for recheck of his hyperlipidemia. The patient is currently taking Lipitor and Lovaza but was recommended by cardiology to switch to Crestor and Vascepa. They deny any issues with myalgias or history of liver damage from it. They deny any focal numbness or weakness or chest pain.   Anxiety Patient is coming in for anxiety.  The cardiologist seem to think that he is chest pain was at least in part due to his anxiety being overworked.  Patient is currently on Xanax and Wellbutrin for his anxiety.  The Wellbutrin could be increased and the Xanax is 2-3 times daily.  He admits to having a lot of anxiety and stress through work and at home and has had some significant family losses over the past year.  He denies any suicidal ideations or thoughts of hurting himself. Depression screen [facility] 2/9 10/27/2017 09/10/2017 06/24/2017 04/22/2017 02/09/2017  Decreased Interest 2 0 0 0 0  Down, Depressed, Hopeless 0 0 0 0 0  PHQ - 2 Score 2 0 0 0 0  Altered sleeping 0 - - - -  Tired,  decreased energy 2 - - - -  Change in appetite 1 - - - -  Feeling bad or failure about yourself  0 - - - -  Trouble concentrating 0 - - - -  Moving slowly or fidgety/restless 0 - - - -  Suicidal thoughts 0 - - - -  PHQ-9 Score 5 - - - -  Difficult doing work/chores Somewhat difficult - - - -     Relevant past medical, surgical, family and social history reviewed and updated as indicated. Interim medical history since our last visit reviewed. Allergies and medications reviewed and updated.  Review of Systems  Constitutional: Positive for fatigue. Negative for chills and fever.  Eyes: Negative for discharge.  Respiratory: Negative for shortness of breath and wheezing.   Cardiovascular: Negative for chest pain and leg swelling.  Musculoskeletal: Negative for back pain and gait problem.  Skin: Negative for rash.  Neurological: Negative for dizziness, weakness, light-headedness, numbness and headaches.  Psychiatric/Behavioral: Positive for decreased concentration and dysphoric mood. Negative for self-injury, sleep disturbance and suicidal ideas. The patient is nervous/anxious.   All other systems reviewed and are negative.   Per HPI unless specifically indicated above        Objective:    BP 130/81   Pulse 61   Temp 97.8 F (36.6 C) (Oral)   Ht 5\' 10"  (1.778 m)   Wt  267 lb (121.1 kg)   BMI 38.31 kg/m   Wt Readings from Last 3 Encounters:  10/27/17 267 lb (121.1 kg)  09/24/17 264 lb 3.2 oz (119.8 kg)  09/23/17 267 lb (121.1 kg)    Physical Exam  Constitutional: He is oriented to person, place, and time. He appears well-developed and well-nourished. No distress.  Eyes: Conjunctivae are normal. No scleral icterus.  Neck: Neck supple. No thyromegaly present.  Cardiovascular: Normal rate, regular rhythm, normal heart sounds and intact distal pulses.  No murmur heard. Pulmonary/Chest: Effort normal and breath sounds normal. No respiratory distress. He has no wheezes.    Musculoskeletal: Normal range of motion. He exhibits no edema.  Lymphadenopathy:    He has no cervical adenopathy.  Neurological: He is alert and oriented to person, place, and time. Coordination normal.  Skin: Skin is warm and dry. No rash noted. He is not diaphoretic.  Psychiatric: He has a normal mood and affect. His behavior is normal.  Nursing note and vitals reviewed.       Assessment & Plan:   Problem List Items Addressed This Visit      Other   GAD (generalized anxiety disorder) - Primary   Relevant Medications   buPROPion (WELLBUTRIN XL) 300 MG 24 hr tablet   Hyperlipidemia   Relevant Medications   Icosapent Ethyl (VASCEPA) 1 g CAPS   metoprolol tartrate (LOPRESSOR) 25 MG tablet   rosuvastatin (CRESTOR) 40 MG tablet    Other Visit Diagnoses    Coronary artery disease of native artery of native heart with stable angina pectoris (HCC)       Relevant Medications   Icosapent Ethyl (VASCEPA) 1 g CAPS   metoprolol tartrate (LOPRESSOR) 25 MG tablet   rosuvastatin (CRESTOR) 40 MG tablet       Follow up plan: Return in about 2 months (around 12/28/2017), or if symptoms worsen or fail to improve, for Anxiety recheck.  Counseling provided for all of the vaccine components No orders of the defined types were placed in this encounter.   Francisco Pina, MD [facility] 10/27/2017, 4:08 PM

## 2018-01-03 DIAGNOSIS — I10 Essential (primary) hypertension: Secondary | ICD-10-CM | POA: Diagnosis not present

## 2018-01-03 DIAGNOSIS — M545 Low back pain: Secondary | ICD-10-CM | POA: Diagnosis not present

## 2018-01-14 ENCOUNTER — Ambulatory Visit: Payer: BLUE CROSS/BLUE SHIELD | Admitting: Nurse Practitioner

## 2018-01-14 ENCOUNTER — Encounter: Payer: Self-pay | Admitting: Nurse Practitioner

## 2018-01-14 VITALS — BP 151/85 | HR 79 | Temp 97.4°F | Ht 70.0 in | Wt 270.0 lb

## 2018-01-14 DIAGNOSIS — R05 Cough: Secondary | ICD-10-CM

## 2018-01-14 DIAGNOSIS — J0100 Acute maxillary sinusitis, unspecified: Secondary | ICD-10-CM | POA: Diagnosis not present

## 2018-01-14 DIAGNOSIS — R059 Cough, unspecified: Secondary | ICD-10-CM

## 2018-01-14 MED ORDER — AZITHROMYCIN 250 MG PO TABS: ORAL_TABLET | ORAL | 0 refills | Status: DC

## 2018-01-14 MED ORDER — HYDROCODONE-HOMATROPINE 5-1.5 MG/5ML PO SYRP: 5.0000 mL | ORAL_SOLUTION | Freq: Four times a day (QID) | ORAL | 0 refills | Status: DC | PRN

## 2018-01-14 NOTE — Patient Instructions (Signed)
1. Take meds as prescribed 2. Use a cool mist humidifier especially during the winter months and when heat has been humid. 3. Use saline nose sprays frequently 4. Saline irrigations of the nose can be very helpful if done frequently.  * 4X daily for 1 week*  * Use of a nettie pot can be helpful with this. Follow directions with this* 5. Drink plenty of fluids 6. Keep thermostat turn down low 7.For any cough or congestion  Use plain Mucinex- regular strength or max strength is fine- avoid decongestants   * Children- consult with Pharmacist for dosing 8. For fever or aces or pains- take tylenol or ibuprofen appropriate for age and weight.  * for fevers greater than 101 orally you may alternate ibuprofen and tylenol every  3 hours.

## 2018-01-14 NOTE — Progress Notes (Signed)
   Subjective:    Patient ID: Francisco Allen, male    DOB: 03/08/1961, 57 y.o.   MRN: [id number]  HPI Patient comes in today c/o cough and congestion. Started yesterday morning. Cough has become productive. He has been using OTC cough meds with no relief.    Review of Systems  Constitutional: Positive for chills and fever (?). Negative for appetite change.  HENT: Positive for congestion, ear pain, rhinorrhea, sinus pressure and sinus pain. Negative for sore throat and trouble swallowing.   Respiratory: Positive for cough (productive). Negative for shortness of breath.   Cardiovascular: Negative.   Gastrointestinal: Negative.   Neurological: Negative.   Psychiatric/Behavioral: Negative.   All other systems reviewed and are negative.      Objective:   Physical Exam  Constitutional: He is oriented to person, place, and time. He appears well-developed and well-nourished. No distress.  HENT:  Right Ear: Hearing, tympanic membrane, external ear and ear canal normal.  Left Ear: Hearing, tympanic membrane, external ear and ear canal normal.  Nose: Mucosal edema and rhinorrhea present. Right sinus exhibits maxillary sinus tenderness. Right sinus exhibits no frontal sinus tenderness. Left sinus exhibits maxillary sinus tenderness.  Mouth/Throat: Uvula is midline and oropharynx is clear and moist.  Neck: Normal range of motion. Neck supple.  Cardiovascular: Normal rate and regular rhythm.  Pulmonary/Chest: Effort normal and breath sounds normal.  Abdominal: Soft. Bowel sounds are normal.  Neurological: He is alert and oriented to person, place, and time.  Skin: Skin is warm.  Psychiatric: He has a normal mood and affect. His behavior is normal. Judgment and thought content normal.   BP (!) 151/85   Pulse 79   Temp (!) 97.4 F (36.3 C) (Oral)   Ht 5\' 10"  (1.778 m)   Wt 270 lb (122.5 kg)   BMI 38.74 kg/m       Assessment & Plan:   1. Acute maxillary sinusitis, recurrence not  specified   2. Cough    Meds ordered this encounter  Medications  . azithromycin (ZITHROMAX Z-PAK) 250 MG tablet    Sig: As directed    Dispense:  6 tablet    Refill:  0    Order Specific Question:   Supervising Provider    Answer:   VINCENT, CAROL L [[id number]]  . HYDROcodone-homatropine (HYCODAN) 5-1.5 MG/5ML syrup    Sig: Take 5 mLs by mouth every 6 (six) hours as needed for cough.    Dispense:  120 mL    Refill:  0    Order Specific Question:   Supervising Provider    Answer:   VINCENT, CAROL L [[id number]]   1. Take meds as prescribed 2. Use a cool mist humidifier especially during the winter months and when heat has been humid. 3. Use saline nose sprays frequently 4. Saline irrigations of the nose can be very helpful if done frequently.  * 4X daily for 1 week*  * Use of a nettie pot can be helpful with this. Follow directions with this* 5. Drink plenty of fluids 6. Keep thermostat turn down low 7.For any cough or congestion  Use plain Mucinex- regular strength or max strength is fine- avoid decongestants    * Children- consult with Pharmacist for dosing 8. For fever or aces or pains- take tylenol or ibuprofen appropriate for age and weight.  * for fevers greater than 101 orally you may alternate ibuprofen and tylenol every  3 hours.   Mary-Margaret Hassell Done, FNP

## 2018-01-17 DIAGNOSIS — R1032 Left lower quadrant pain: Secondary | ICD-10-CM | POA: Diagnosis not present

## 2018-01-17 DIAGNOSIS — M545 Low back pain: Secondary | ICD-10-CM | POA: Diagnosis not present

## 2018-01-17 DIAGNOSIS — I10 Essential (primary) hypertension: Secondary | ICD-10-CM | POA: Diagnosis not present

## 2018-03-02 ENCOUNTER — Encounter: Payer: Self-pay | Admitting: Family Medicine

## 2018-03-02 ENCOUNTER — Ambulatory Visit (INDEPENDENT_AMBULATORY_CARE_PROVIDER_SITE_OTHER): Payer: BLUE CROSS/BLUE SHIELD | Admitting: Family Medicine

## 2018-03-02 VITALS — BP 148/87 | HR 71 | Temp 98.1°F | Ht 70.0 in | Wt 269.2 lb

## 2018-03-02 DIAGNOSIS — K5732 Diverticulitis of large intestine without perforation or abscess without bleeding: Secondary | ICD-10-CM

## 2018-03-02 DIAGNOSIS — K921 Melena: Secondary | ICD-10-CM

## 2018-03-02 MED ORDER — METRONIDAZOLE 500 MG PO TABS: 500.0000 mg | ORAL_TABLET | Freq: Two times a day (BID) | ORAL | 0 refills | Status: DC

## 2018-03-02 MED ORDER — CIPROFLOXACIN HCL 500 MG PO TABS: 500.0000 mg | ORAL_TABLET | Freq: Two times a day (BID) | ORAL | 0 refills | Status: DC

## 2018-03-02 NOTE — Progress Notes (Signed)
Subjective:  Patient ID: Francisco Allen, male    DOB: 10-Feb-1961  Age: 57 y.o. MRN: [id number]  CC: Rectal Bleeding (pt here today c/o bright red rectal bleeding that he first noticed this morning)   HPI Francisco Allen presents for flare of pain in LLQ over the last few days accompanied by bright red blood per rectum. Small amount accompanies stool. None between stools. Has history of diverticulosis with diverticulitis. Has a bout of rectal bleeding about once every 4 months for the last few years; Pain is intermittent, moderate ache varying in intensity.   Depression screen [facility] 2/9 01/14/2018 10/27/2017 09/10/2017  Decreased Interest 0 2 0  Down, Depressed, Hopeless 0 0 0  PHQ - 2 Score 0 2 0  Altered sleeping - 0 -  Tired, decreased energy - 2 -  Change in appetite - 1 -  Feeling bad or failure about yourself  - 0 -  Trouble concentrating - 0 -  Moving slowly or fidgety/restless - 0 -  Suicidal thoughts - 0 -  PHQ-9 Score - 5 -  Difficult doing work/chores - Somewhat difficult -    History Francisco Allen has a past medical history of Arthritis, Colon polyp, Diverticulitis, GERD (gastroesophageal reflux disease), Hyperlipidemia, and Pre-diabetes.   He has a past surgical history that includes Cyst excision (2004); Hemorrhoid surgery; Shoulder arthroscopy with rotator cuff repair (Right); Colonoscopy w/ biopsies and polypectomy (X 2); Colonoscopy (X ~ 2); and LEFT HEART CATH AND CORONARY ANGIOGRAPHY (N/A, 09/24/2017).   His family history includes CVA in his father; Colon cancer in his brother; Dementia in his mother; Heart disease in his brother, brother, and brother; Sudden death (age of onset: 37) in his sister.He reports that he has been smoking cigarettes.  He has a 34.00 pack-year smoking history. He has never used smokeless tobacco. He reports that he does not drink alcohol or use drugs.    ROS Review of Systems  Constitutional: Negative.  Negative for chills, diaphoresis, fever  and unexpected weight change.  HENT: Negative.  Negative for rhinorrhea and trouble swallowing.   Eyes: Negative for visual disturbance.  Respiratory: Negative for cough, chest tightness and shortness of breath.   Cardiovascular: Negative for chest pain and leg swelling.  Gastrointestinal: Positive for abdominal pain and blood in stool. Negative for abdominal distention, diarrhea, nausea, rectal pain and vomiting.  Genitourinary: Negative for difficulty urinating, dysuria, flank pain and hematuria.  Musculoskeletal: Negative for arthralgias, joint swelling and myalgias.  Skin: Negative for rash.  Neurological: Negative for syncope and headaches.  Psychiatric/Behavioral: Negative for sleep disturbance.    Objective:  BP (!) 148/87   Pulse 71   Temp 98.1 F (36.7 C) (Oral)   Ht 5\' 10"  (1.778 m)   Wt 269 lb 4 oz (122.1 kg)   BMI 38.63 kg/m   BP Readings from Last 3 Encounters:  03/02/18 (!) 148/87  01/14/18 (!) 151/85  10/27/17 130/81    Wt Readings from Last 3 Encounters:  03/02/18 269 lb 4 oz (122.1 kg)  01/14/18 270 lb (122.5 kg)  10/27/17 267 lb (121.1 kg)     Physical Exam  Constitutional: He appears well-developed and well-nourished.  HENT:  Head: Normocephalic and atraumatic.  Right Ear: Tympanic membrane and external ear normal. No decreased hearing is noted.  Left Ear: Tympanic membrane and external ear normal. No decreased hearing is noted.  Mouth/Throat: No oropharyngeal exudate or posterior oropharyngeal erythema.  Eyes: Pupils are equal, round, and reactive to light.  Neck: Normal range of motion. Neck supple.  Cardiovascular: Normal rate and regular rhythm.  No murmur heard. Pulmonary/Chest: Breath sounds normal. No respiratory distress.  Abdominal: Soft. Bowel sounds are normal. He exhibits no distension and no mass. There is tenderness. There is no guarding.  Vitals reviewed.     Assessment & Plan:   Francisco Allen was seen today for rectal  bleeding.  Diagnoses and all orders for this visit:  Diverticulitis of colon  Hematochezia  Other orders -     metroNIDAZOLE (FLAGYL) 500 MG tablet; Take 1 tablet (500 mg total) by mouth 2 (two) times daily. -     ciprofloxacin (CIPRO) 500 MG tablet; Take 1 tablet (500 mg total) by mouth 2 (two) times daily.       I have discontinued Fredie Majano. Strehl's azithromycin and HYDROcodone-homatropine. I am also having him start on metroNIDAZOLE and ciprofloxacin. Additionally, I am having him maintain his Multiple Vitamins-Minerals (MENS 50+ MULTI VITAMIN/MIN PO), ALPRAZolam, Cholecalciferol (VITAMIN D PO), aspirin, Icosapent Ethyl, buPROPion, metoprolol tartrate, rosuvastatin, and clobetasol.  Allergies as of 03/02/2018      Reactions   Bee Venom Anaphylaxis, Swelling, Other (See Comments)   Anaphylactic shock   Penicillins Anaphylaxis, Swelling   Airway closes up Has patient had a PCN reaction causing immediate rash, facial/tongue/throat swelling, SOB or lightheadedness with hypotension: Yes Has patient had a PCN reaction causing severe rash involving mucus membranes or skin necrosis: Yes Has patient had a PCN reaction that required hospitalization: [name] Has patient had a PCN reaction occurring within the last 10 years: No If all of the above answers are "NO", then may proceed with Cephalosporin use.      Medication List        Accurate as of 03/02/18 11:59 PM. Always use your most recent med list.          ALPRAZolam 1 MG tablet Commonly known as:  XANAX Take 1 tablet (1 mg total) by mouth 3 (three) times daily as needed.   aspirin 325 MG EC tablet Take 1 tablet (325 mg total) daily by mouth.   buPROPion 300 MG 24 hr tablet Commonly known as:  WELLBUTRIN XL Take 1 tablet (300 mg total) by mouth daily.   ciprofloxacin 500 MG tablet Commonly known as:  CIPRO Take 1 tablet (500 mg total) by mouth 2 (two) times daily.   clobetasol 0.05 % external solution Commonly known as:   TEMOVATE Apply 1 application topically 2 (two) times daily.   Icosapent Ethyl 1 g Caps Commonly known as:  VASCEPA Take 2 g by mouth 2 (two) times daily.   MENS 50+ MULTI VITAMIN/MIN PO Take by mouth daily.   metoprolol tartrate 25 MG tablet Commonly known as:  LOPRESSOR Take 1 tablet (25 mg total) by mouth 2 (two) times daily.   metroNIDAZOLE 500 MG tablet Commonly known as:  FLAGYL Take 1 tablet (500 mg total) by mouth 2 (two) times daily.   rosuvastatin 40 MG tablet Commonly known as:  CRESTOR Take 1 tablet (40 mg total) by mouth daily.   VITAMIN D PO Take 1 capsule daily by mouth.        Follow-up: Return if symptoms worsen or fail to improve.  Claretta Fraise, M.D.

## 2018-03-06 ENCOUNTER — Encounter: Payer: Self-pay | Admitting: Family Medicine

## 2018-05-04 ENCOUNTER — Encounter: Payer: Self-pay | Admitting: Family Medicine

## 2018-05-18 ENCOUNTER — Encounter: Payer: BLUE CROSS/BLUE SHIELD | Admitting: Family Medicine

## 2018-05-18 ENCOUNTER — Telehealth: Payer: Self-pay | Admitting: Family Medicine

## 2018-05-18 MED ORDER — AZITHROMYCIN 250 MG PO TABS: ORAL_TABLET | ORAL | 0 refills | Status: DC

## 2018-05-18 NOTE — Telephone Encounter (Signed)
What symptoms do you have? Cold congested cough  How long have you been sick? This morning  Have you been seen for this problem? no  If your provider decides to give you a prescription, which pharmacy would you like for it to be sent to? [facility]   Patient informed that this information will be sent to the clinical staff for review and that they should receive a follow up call.

## 2018-05-18 NOTE — Telephone Encounter (Signed)
Please advise 

## 2018-05-27 ENCOUNTER — Ambulatory Visit: Payer: BLUE CROSS/BLUE SHIELD | Admitting: Family Medicine

## 2018-06-08 ENCOUNTER — Encounter: Payer: Self-pay | Admitting: *Deleted

## 2018-06-08 ENCOUNTER — Ambulatory Visit: Payer: BLUE CROSS/BLUE SHIELD | Admitting: Family Medicine

## 2018-06-08 ENCOUNTER — Encounter: Payer: Self-pay | Admitting: Family Medicine

## 2018-06-08 VITALS — BP 139/91 | HR 71 | Temp 97.2°F | Ht 70.0 in | Wt 270.6 lb

## 2018-06-08 DIAGNOSIS — K5792 Diverticulitis of intestine, part unspecified, without perforation or abscess without bleeding: Secondary | ICD-10-CM

## 2018-06-08 DIAGNOSIS — Z0289 Encounter for other administrative examinations: Secondary | ICD-10-CM

## 2018-06-08 DIAGNOSIS — F411 Generalized anxiety disorder: Secondary | ICD-10-CM

## 2018-06-08 MED ORDER — METRONIDAZOLE 500 MG PO TABS: 500.0000 mg | ORAL_TABLET | Freq: Two times a day (BID) | ORAL | 0 refills | Status: DC

## 2018-06-08 MED ORDER — CIPROFLOXACIN HCL 500 MG PO TABS: 500.0000 mg | ORAL_TABLET | Freq: Two times a day (BID) | ORAL | 0 refills | Status: DC

## 2018-06-08 MED ORDER — ALPRAZOLAM 1 MG PO TABS: 1.0000 mg | ORAL_TABLET | Freq: Three times a day (TID) | ORAL | 2 refills | Status: DC | PRN

## 2018-06-08 NOTE — Progress Notes (Signed)
BP (!) 139/91   Pulse 71   Temp (!) 97.2 F (36.2 C) (Oral)   Ht 5\' 10"  (1.778 m)   Wt 270 lb 9.6 oz (122.7 kg)   BMI 38.83 kg/m    Subjective:    Patient ID: Francisco Allen, male    DOB: June 15, 1961, 57 y.o.   MRN: [id number]  HPI: Francisco Allen is a 57 y.o. male presenting on 06/08/2018 for Blood In Stools (Patient noticed this morning)   HPI Abdominal pain and blood in stool Patient is coming in with complaints of abdominal pain and blood in his stool that just started this morning.  He has had recurrent diverticulitis in the past and this is very similar to when he has had it before.  He does not know what changed to bring this 1 upon him but he had been doing well for quite some time without any recurrent episodes.  He denies any fevers or chills.  He says the abdominal pain is left lower quadrant and is moderate at this point.  He has had some bright red blood in his stool which is similar to what he has had previously.  Relevant past medical, surgical, family and social history reviewed and updated as indicated. Interim medical history since our last visit reviewed. Allergies and medications reviewed and updated.  Review of Systems  Constitutional: Negative for chills and fever.  Respiratory: Negative for shortness of breath and wheezing.   Cardiovascular: Negative for chest pain and leg swelling.  Gastrointestinal: Positive for abdominal pain and blood in stool. Negative for abdominal distention, constipation, diarrhea, nausea and vomiting.  Musculoskeletal: Negative for back pain and gait problem.  Skin: Negative for rash.  All other systems reviewed and are negative.   Per HPI unless specifically indicated above   Allergies as of 06/08/2018      Reactions   Bee Venom Anaphylaxis, Swelling, Other (See Comments)   Anaphylactic shock   Penicillins Anaphylaxis, Swelling   Airway closes up Has patient had a PCN reaction causing immediate rash, facial/tongue/throat  swelling, SOB or lightheadedness with hypotension: Yes Has patient had a PCN reaction causing severe rash involving mucus membranes or skin necrosis: Yes Has patient had a PCN reaction that required hospitalization: [name] Has patient had a PCN reaction occurring within the last 10 years: No If all of the above answers are "NO", then may proceed with Cephalosporin use.      Medication List        Accurate as of 06/08/18 11:59 AM. Always use your most recent med list.          ALPRAZolam 1 MG tablet Commonly known as:  XANAX Take 1 tablet (1 mg total) by mouth 3 (three) times daily as needed for anxiety.   aspirin 325 MG EC tablet Take 1 tablet (325 mg total) daily by mouth.   buPROPion 300 MG 24 hr tablet Commonly known as:  WELLBUTRIN XL Take 1 tablet (300 mg total) by mouth daily.   ciprofloxacin 500 MG tablet Commonly known as:  CIPRO Take 1 tablet (500 mg total) by mouth 2 (two) times daily.   clobetasol 0.05 % external solution Commonly known as:  TEMOVATE Apply 1 application topically 2 (two) times daily.   Icosapent Ethyl 1 g Caps Commonly known as:  VASCEPA Take 2 g by mouth 2 (two) times daily.   MENS 50+ MULTI VITAMIN/MIN PO Take by mouth daily.   metoprolol tartrate 25 MG tablet Commonly known as:  LOPRESSOR Take 1 tablet (25 mg total) by mouth 2 (two) times daily.   metroNIDAZOLE 500 MG tablet Commonly known as:  FLAGYL Take 1 tablet (500 mg total) by mouth 2 (two) times daily.   rosuvastatin 40 MG tablet Commonly known as:  CRESTOR Take 1 tablet (40 mg total) by mouth daily.   VITAMIN D PO Take 1 capsule daily by mouth.          Objective:    BP (!) 139/91   Pulse 71   Temp (!) 97.2 F (36.2 C) (Oral)   Ht 5\' 10"  (1.778 m)   Wt 270 lb 9.6 oz (122.7 kg)   BMI 38.83 kg/m   Wt Readings from Last 3 Encounters:  06/08/18 270 lb 9.6 oz (122.7 kg)  03/02/18 269 lb 4 oz (122.1 kg)  01/14/18 270 lb (122.5 kg)    Physical Exam  Constitutional:  He is oriented to person, place, and time. He appears well-developed and well-nourished. No distress.  Eyes: Conjunctivae are normal. No scleral icterus.  Neck: Neck supple. No thyromegaly present.  Cardiovascular: Normal rate, regular rhythm, normal heart sounds and intact distal pulses.  No murmur heard. Pulmonary/Chest: Effort normal and breath sounds normal. No respiratory distress. He has no wheezes.  Abdominal: Soft. Bowel sounds are normal. He exhibits no distension and no mass. There is tenderness in the left lower quadrant. There is no rebound and no guarding.  Musculoskeletal: Normal range of motion. He exhibits no edema.  Lymphadenopathy:    He has no cervical adenopathy.  Neurological: He is alert and oriented to person, place, and time. Coordination normal.  Skin: Skin is warm and dry. No rash noted. He is not diaphoretic.  Psychiatric: He has a normal mood and affect. His behavior is normal.  Nursing note and vitals reviewed.      Assessment & Plan:   Problem List Items Addressed This Visit      Other   GAD (generalized anxiety disorder)   Relevant Medications   ALPRAZolam (XANAX) 1 MG tablet    Other Visit Diagnoses    Diverticulitis    -  Primary   Relevant Medications   metroNIDAZOLE (FLAGYL) 500 MG tablet   ciprofloxacin (CIPRO) 500 MG tablet      Follow up plan: Return if symptoms worsen or fail to improve.  Counseling provided for all of the vaccine components No orders of the defined types were placed in this encounter.   Caryl Pina, MD [facility] 06/08/2018, 11:59 AM

## 2018-07-21 ENCOUNTER — Encounter: Payer: Self-pay | Admitting: Family Medicine

## 2018-07-21 ENCOUNTER — Ambulatory Visit: Payer: BLUE CROSS/BLUE SHIELD | Admitting: Family Medicine

## 2018-07-21 VITALS — BP 142/93 | HR 77 | Temp 97.2°F | Ht 70.0 in | Wt 270.6 lb

## 2018-07-21 DIAGNOSIS — K5792 Diverticulitis of intestine, part unspecified, without perforation or abscess without bleeding: Secondary | ICD-10-CM | POA: Diagnosis not present

## 2018-07-21 DIAGNOSIS — K5732 Diverticulitis of large intestine without perforation or abscess without bleeding: Secondary | ICD-10-CM

## 2018-07-21 MED ORDER — CIPROFLOXACIN HCL 500 MG PO TABS: 500.0000 mg | ORAL_TABLET | Freq: Two times a day (BID) | ORAL | 0 refills | Status: DC

## 2018-07-21 MED ORDER — METRONIDAZOLE 500 MG PO TABS: 500.0000 mg | ORAL_TABLET | Freq: Two times a day (BID) | ORAL | 0 refills | Status: DC

## 2018-07-21 NOTE — Progress Notes (Signed)
BP (!) 142/93   Pulse 77   Temp (!) 97.2 F (36.2 C) (Oral)   Ht 5\' 10"  (1.778 m)   Wt 270 lb 9.6 oz (122.7 kg)   SpO2 97%   BMI 38.83 kg/m    Subjective:    Patient ID: Francisco Allen, male    DOB: [birth year], 57 y.o.   MRN: [id number]  HPI: Francisco Allen is a 57 y.o. male presenting on 07/21/2018 for Diverticulitis (Patient states that he is having a flare up since this morning. ); Headache; Blood In Stools; and Abdominal Pain (LLQ)   HPI Left lower quadrant abdominal pain and diarrhea and blood in stool Patient is coming in with complaints of left lower quadrant abdominal pain that has been going on since this morning.  He is also been having diarrhea x4 this morning and blood in his stool which is very similar to what he had previously with diverticulitis.  He denies any fevers or chills.  He he does say the abdominal pain is moderate to severe in intensity and is about what he usually gets for diverticulitis.  He had been doing well until he had about just recently and then this around, hopefully he can refocus on his diet and get it to go away again.  He denies any pain radiating anywhere else or nausea or vomiting or urinary symptoms.  Relevant past medical, surgical, family and social history reviewed and updated as indicated. Interim medical history since our last visit reviewed. Allergies and medications reviewed and updated.  Review of Systems  Constitutional: Negative for chills and fever.  Respiratory: Negative for shortness of breath and wheezing.   Cardiovascular: Negative for chest pain and leg swelling.  Gastrointestinal: Positive for abdominal pain, blood in stool and diarrhea. Negative for nausea and vomiting.  Musculoskeletal: Negative for back pain and gait problem.  Skin: Negative for rash.  All other systems reviewed and are negative.   Per HPI unless specifically indicated above   Allergies as of 07/21/2018      Reactions   Bee Venom Anaphylaxis,  Swelling, Other (See Comments)   Anaphylactic shock   Penicillins Anaphylaxis, Swelling   Airway closes up Has patient had a PCN reaction causing immediate rash, facial/tongue/throat swelling, SOB or lightheadedness with hypotension: Yes Has patient had a PCN reaction causing severe rash involving mucus membranes or skin necrosis: Yes Has patient had a PCN reaction that required hospitalization: [name] Has patient had a PCN reaction occurring within the last 10 years: No If all of the above answers are "NO", then may proceed with Cephalosporin use.      Medication List        Accurate as of 07/21/18 12:20 PM. Always use your most recent med list.          ALPRAZolam 1 MG tablet Commonly known as:  XANAX Take 1 tablet (1 mg total) by mouth 3 (three) times daily as needed for anxiety.   aspirin 325 MG EC tablet Take 1 tablet (325 mg total) daily by mouth.   buPROPion 300 MG 24 hr tablet Commonly known as:  WELLBUTRIN XL Take 1 tablet (300 mg total) by mouth daily.   ciprofloxacin 500 MG tablet Commonly known as:  CIPRO Take 1 tablet (500 mg total) by mouth 2 (two) times daily.   clobetasol 0.05 % external solution Commonly known as:  TEMOVATE Apply 1 application topically 2 (two) times daily.   Icosapent Ethyl 1 g Caps Take 2 g  by mouth 2 (two) times daily.   MENS 50+ MULTI VITAMIN/MIN PO Take by mouth daily.   metoprolol tartrate 25 MG tablet Commonly known as:  LOPRESSOR Take 1 tablet (25 mg total) by mouth 2 (two) times daily.   metroNIDAZOLE 500 MG tablet Commonly known as:  FLAGYL Take 1 tablet (500 mg total) by mouth 2 (two) times daily.   rosuvastatin 40 MG tablet Commonly known as:  CRESTOR Take 1 tablet (40 mg total) by mouth daily.   VITAMIN D PO Take 1 capsule daily by mouth.          Objective:    BP (!) 142/93   Pulse 77   Temp (!) 97.2 F (36.2 C) (Oral)   Ht 5\' 10"  (1.778 m)   Wt 270 lb 9.6 oz (122.7 kg)   SpO2 97%   BMI 38.83 kg/m     Wt Readings from Last 3 Encounters:  07/21/18 270 lb 9.6 oz (122.7 kg)  06/08/18 270 lb 9.6 oz (122.7 kg)  03/02/18 269 lb 4 oz (122.1 kg)    Physical Exam  Constitutional: He is oriented to person, place, and time. He appears well-developed and well-nourished. No distress.  Eyes: Conjunctivae are normal. Right eye exhibits no discharge.  Neck: No thyromegaly present.  Cardiovascular: Normal rate, regular rhythm, normal heart sounds and intact distal pulses.  No murmur heard. Pulmonary/Chest: Effort normal and breath sounds normal. No respiratory distress. He has no wheezes.  Abdominal: Soft. He exhibits no distension and no mass. There is tenderness (Left lower quadrant abdominal tenderness, no CVA tenderness).  Musculoskeletal: Normal range of motion. He exhibits no edema.  Neurological: He is alert and oriented to person, place, and time. Coordination normal.  Skin: Skin is warm and dry. No rash noted. He is not diaphoretic.  Psychiatric: He has a normal mood and affect. His behavior is normal.  Nursing note and vitals reviewed.       Assessment & Plan:   Problem List Items Addressed This Visit      Digestive   Diverticulitis of colon - Primary   Relevant Medications   metroNIDAZOLE (FLAGYL) 500 MG tablet   ciprofloxacin (CIPRO) 500 MG tablet    Other Visit Diagnoses    Diverticulitis       Relevant Medications   metroNIDAZOLE (FLAGYL) 500 MG tablet   ciprofloxacin (CIPRO) 500 MG tablet       Follow up plan: Return if symptoms worsen or fail to improve.  Counseling provided for all of the vaccine components No orders of the defined types were placed in this encounter.   Caryl Pina, MD [facility] 07/21/2018, 12:20 PM

## 2018-08-02 ENCOUNTER — Ambulatory Visit: Payer: BLUE CROSS/BLUE SHIELD | Admitting: Family Medicine

## 2018-08-02 ENCOUNTER — Encounter: Payer: Self-pay | Admitting: Family Medicine

## 2018-08-02 VITALS — BP 135/86 | HR 82 | Temp 97.1°F | Ht 70.0 in | Wt 268.0 lb

## 2018-08-02 DIAGNOSIS — R1013 Epigastric pain: Secondary | ICD-10-CM

## 2018-08-02 DIAGNOSIS — R197 Diarrhea, unspecified: Secondary | ICD-10-CM | POA: Diagnosis not present

## 2018-08-02 NOTE — Patient Instructions (Signed)
This very well may be viral or related to something you ate.  I doubt that this is your diverticulitis based on your exam today.  You are tender in the area that typically correlates with increased acid or acid reflux.  Consider taking an acid reducer for the next week to see if this might improve your symptoms.  Make sure that you are drinking plenty of clear fluids.  Do not use Imodium for now, as this may prolong your diarrheal illness.  If you develop any other worrisome symptoms or signs we discussed, please seek immediate medical attention.   Food Choices to Help Relieve Diarrhea, Adult When you have diarrhea, the foods you eat and your eating habits are very important. Choosing the right foods and drinks can help:  Relieve diarrhea.  Replace lost fluids and nutrients.  Prevent dehydration.  What general guidelines should I follow? Relieving diarrhea  Choose foods with less than 2 g or .07 oz. of fiber per serving.  Limit fats to less than 8 tsp (38 g or 1.34 oz.) a day.  Avoid the following: ? Foods and beverages sweetened with high-fructose corn syrup, honey, or sugar alcohols such as xylitol, sorbitol, and mannitol. ? Foods that contain a lot of fat or sugar. ? Fried, greasy, or spicy foods. ? High-fiber grains, breads, and cereals. ? Raw fruits and vegetables.  Eat foods that are rich in probiotics. These foods include dairy products such as yogurt and fermented milk products. They help increase healthy bacteria in the stomach and intestines (gastrointestinal tract, or GI tract).  If you have lactose intolerance, avoid dairy products. These may make your diarrhea worse.  Take medicine to help stop diarrhea (antidiarrheal medicine) only as told by your health care provider. Replacing nutrients  Eat small meals or snacks every 3-4 hours.  Eat bland foods, such as white rice, toast, or baked potato, until your diarrhea starts to get better. Gradually reintroduce  nutrient-rich foods as tolerated or as told by your health care provider. This includes: ? Well-cooked protein foods. ? Peeled, seeded, and soft-cooked fruits and vegetables. ? Low-fat dairy products.  Take vitamin and mineral supplements as told by your health care provider. Preventing dehydration   Start by sipping water or a special solution to prevent dehydration (oral rehydration solution, ORS). Urine that is clear or pale yellow means that you are getting enough fluid.  Try to drink at least 8-10 cups of fluid each day to help replace lost fluids.  You may add other liquids in addition to water, such as clear juice or decaffeinated sports drinks, as tolerated or as told by your health care provider.  Avoid drinks with caffeine, such as coffee, tea, or soft drinks.  Avoid alcohol. What foods are recommended? The items listed may not be a complete list. Talk with your health care provider about what dietary choices are best for you. Grains White rice. White, [place], or pita breads (fresh or toasted), including plain rolls, buns, or bagels. White pasta. Saltine, soda, or graham crackers. Pretzels. Low-fiber cereal. Cooked cereals made with water (such as cornmeal, farina, or cream cereals). Plain muffins. Matzo. Melba toast. Zwieback. Vegetables Potatoes (without the skin). Most well-cooked and canned vegetables without skins or seeds. Tender lettuce. Fruits Apple sauce. Fruits canned in juice. Cooked apricots, cherries, grapefruit, peaches, pears, or plums. Fresh bananas and cantaloupe. Meats and other protein foods Baked or boiled chicken. Eggs. Tofu. Fish. Seafood. Smooth nut butters. Ground or well-cooked tender beef, ham, veal,  lamb, pork, or poultry. Dairy Plain yogurt, kefir, and unsweetened liquid yogurt. Lactose-free milk, buttermilk, skim milk, or soy milk. Low-fat or nonfat hard cheese. Beverages Water. Low-calorie sports drinks. Fruit juices without pulp. Strained tomato  and vegetable juices. Decaffeinated teas. Sugar-free beverages not sweetened with sugar alcohols. Oral rehydration solutions, if approved by your health care provider. Seasoning and other foods Bouillon, broth, or soups made from recommended foods. What foods are not recommended? The items listed may not be a complete list. Talk with your health care provider about what dietary choices are best for you. Grains Whole grain, whole wheat, bran, or rye breads, rolls, pastas, and crackers. Wild or brown rice. Whole grain or bran cereals. Barley. Oats and oatmeal. Corn tortillas or taco shells. Granola. Popcorn. Vegetables Raw vegetables. Fried vegetables. Cabbage, broccoli, Brussels sprouts, artichokes, baked beans, beet greens, corn, kale, legumes, peas, sweet potatoes, and yams. Potato skins. Cooked spinach and cabbage. Fruits Dried fruit, including raisins and dates. Raw fruits. Stewed or dried prunes. Canned fruits with syrup. Meat and other protein foods Fried or fatty meats. Deli meats. Chunky nut butters. Nuts and seeds. Beans and lentils. Berniece Salines. Hot dogs. Sausage. Dairy High-fat cheeses. Whole milk, chocolate milk, and beverages made with milk, such as milk shakes. Half-and-half. Cream. sour cream. Ice cream. Beverages Caffeinated beverages (such as coffee, tea, soda, or energy drinks). Alcoholic beverages. Fruit juices with pulp. Prune juice. Soft drinks sweetened with high-fructose corn syrup or sugar alcohols. High-calorie sports drinks. Fats and oils Butter. Cream sauces. Margarine. Salad oils. Plain salad dressings. Olives. Avocados. Mayonnaise. Sweets and desserts Sweet rolls, doughnuts, and sweet breads. Sugar-free desserts sweetened with sugar alcohols such as xylitol and sorbitol. Seasoning and other foods Honey. Hot sauce. Chili powder. Gravy. Cream-based or milk-based soups. Pancakes and waffles. Summary  When you have diarrhea, the foods you eat and your eating habits are very  important.  Make sure you get at least 8-10 cups of fluid each day, or enough to keep your urine clear or pale yellow.  Eat bland foods and gradually reintroduce healthy, nutrient-rich foods as tolerated, or as told by your health care provider.  Avoid high-fiber, fried, greasy, or spicy foods. This information is not intended to replace advice given to you by your health care provider. Make sure you discuss any questions you have with your health care provider. Document Released: 01/16/2004 Document Revised: 10/23/2016 Document Reviewed: 10/23/2016 Elsevier Interactive Patient Education  [employer].

## 2018-08-02 NOTE — Progress Notes (Signed)
Subjective: CC: Abdominal pain, diarrhea PCP: Dettinger, Fransisca Kaufmann, MD WYO:VZCHYIF Francisco Allen is a 57 y.o. male presenting to clinic today for:  1.  Abdominal pain Patient reports acute onset abdominal pain this morning after drinking his coffee.  He notes that he immediately had to go to the restroom and had a normal bowel movement at that time.  Over the progression of the day, abdominal pain became more prominent, he points to the epigastric region.  He notes a posttussive emesis that was nonbloody and nonbilious.  He denies any overt nausea.  He had several other loose/diarrheal bowel movements throughout the day that were nonbloody.  He denies any fevers.  He has had a mild headache.  He has been attempting to hydrate.  Not currently on any PPIs or antacids.  He does have a past medical history significant for diverticulitis, which she was actually treated for in July 21, 2018.  He is completed his antibiotics for this.   ROS: Per HPI  Allergies  Allergen Reactions  . Bee Venom Anaphylaxis, Swelling and Other (See Comments)    Anaphylactic shock  . Penicillins Anaphylaxis and Swelling    Airway closes up Has patient had a PCN reaction causing immediate rash, facial/tongue/throat swelling, SOB or lightheadedness with hypotension: Yes Has patient had a PCN reaction causing severe rash involving mucus membranes or skin necrosis: Yes Has patient had a PCN reaction that required hospitalization: [name] Has patient had a PCN reaction occurring within the last 10 years: No If all of the above answers are "NO", then may proceed with Cephalosporin use.    Past Medical History:  Diagnosis Date  . Arthritis    "all over my body I think" (09/23/2017)  . Colon polyp   . Diverticulitis   . GERD (gastroesophageal reflux disease)    hx  . Hyperlipidemia   . Pre-diabetes     Current Outpatient Medications:  .  ALPRAZolam (XANAX) 1 MG tablet, Take 1 tablet (1 mg total) by mouth 3 (three)  times daily as needed for anxiety., Disp: 90 tablet, Rfl: 2 .  aspirin EC 325 MG EC tablet, Take 1 tablet (325 mg total) daily by mouth., Disp: 30 tablet, Rfl: 0 .  buPROPion (WELLBUTRIN XL) 300 MG 24 hr tablet, Take 1 tablet (300 mg total) by mouth daily., Disp: 30 tablet, Rfl: 3 .  Cholecalciferol (VITAMIN D PO), Take 1 capsule daily by mouth. , Disp: , Rfl:  .  clobetasol (TEMOVATE) 0.05 % external solution, Apply 1 application topically 2 (two) times daily., Disp: 50 mL, Rfl: 6 .  Icosapent Ethyl (VASCEPA) 1 g CAPS, Take 2 g by mouth 2 (two) times daily., Disp: 120 capsule, Rfl: 11 .  metoprolol tartrate (LOPRESSOR) 25 MG tablet, Take 1 tablet (25 mg total) by mouth 2 (two) times daily., Disp: 60 tablet, Rfl: 0 .  Multiple Vitamins-Minerals (MENS 50+ MULTI VITAMIN/MIN PO), Take by mouth daily., Disp: , Rfl:  .  rosuvastatin (CRESTOR) 40 MG tablet, Take 1 tablet (40 mg total) by mouth daily., Disp: 90 tablet, Rfl: 3 Social History   Socioeconomic History  . Marital status: Married    Spouse name: Not on file  . Number of children: Not on file  . Years of education: Not on file  . Highest education level: Not on file  Occupational History  . Not on file  Social Needs  . Financial resource strain: Not on file  . Food insecurity:    Worry: Not on  file    Inability: Not on file  . Transportation needs:    Medical: Not on file    Non-medical: Not on file  Tobacco Use  . Smoking status: Current Every Day Smoker    Packs/day: 1.00    Years: 34.00    Pack years: 34.00    Types: Cigarettes  . Smokeless tobacco: Never Used  Substance and Sexual Activity  . Alcohol use: No  . Drug use: No  . Sexual activity: Yes  Lifestyle  . Physical activity:    Days per week: Not on file    Minutes per session: Not on file  . Stress: Not on file  Relationships  . Social connections:    Talks on phone: Not on file    Gets together: Not on file    Attends religious service: Not on file     Active member of club or organization: Not on file    Attends meetings of clubs or organizations: Not on file    Relationship status: Not on file  . Intimate partner violence:    Fear of current or ex partner: Not on file    Emotionally abused: Not on file    Physically abused: Not on file    Forced sexual activity: Not on file  Other Topics Concern  . Not on file  Social History Narrative  . Not on file   Family History  Problem Relation Age of Onset  . Colon cancer Brother   . Dementia Mother   . CVA Father   . Sudden death Sister 66       in her sleep, was told a heart attack  . Heart disease Brother        CABG  . Heart disease Brother        CABG  . Heart disease Brother        CABG    Objective: Office vital signs reviewed. BP 135/86 (BP Location: Right Arm)   Pulse 82   Temp (!) 97.1 F (36.2 C) (Oral)   Ht 5\' 10"  (1.778 m)   Wt 268 lb (121.6 kg)   BMI 38.45 kg/m   Physical Examination:  General: Awake, alert, morbidly obese, nontoxic, No acute distress HEENT: Normal, sclera white, MMM Cardio: regular rate Pulm: normal work of breathing on room air GI: obese, soft, +epigastric TTP, no peritoneal signs, non-distended, bowel sounds present x4, no hepatomegaly, no splenomegaly, no masses  Assessment/ Plan: 57 y.o. male   1. Epigastric pain Possibly related to gastroenteritis versus gastritis.  I find it unusual that symptoms started after consumption of coffee.  We discussed avoidance of acidic products and I encouraged him to consider trial of PPI versus Zantac.  Because he is not overtly nauseous, no antiemetic was prescribed.  He will follow-up with PCP as directed  2. Diarrhea, unspecified type Possible related to a viral gastroenteritis.  No known exposures.  Does not seem consistent with his typical diverticulitis.  He is afebrile nontoxic-appearing.  No evidence of dehydration on exam.  I encouraged him to avoid Imodium.  Replenish with oral fluids.  I  have given him a work note excusing for the next 48 hours.  Home care instructions were reviewed.  Reasons for return and emergent evaluation emergency department discussed.  If symptoms persist, low threshold for CT abdomen pelvis given history of diverticulitis.   Janora Norlander, DO [place] [phone]

## 2018-08-25 ENCOUNTER — Ambulatory Visit: Payer: BLUE CROSS/BLUE SHIELD | Admitting: Family Medicine

## 2018-08-25 ENCOUNTER — Encounter: Payer: Self-pay | Admitting: Family Medicine

## 2018-08-25 VITALS — BP 148/95 | HR 87 | Temp 98.0°F | Ht 70.0 in | Wt 270.4 lb

## 2018-08-25 DIAGNOSIS — Z23 Encounter for immunization: Secondary | ICD-10-CM | POA: Diagnosis not present

## 2018-08-25 DIAGNOSIS — R7303 Prediabetes: Secondary | ICD-10-CM

## 2018-08-25 DIAGNOSIS — F411 Generalized anxiety disorder: Secondary | ICD-10-CM | POA: Diagnosis not present

## 2018-08-25 DIAGNOSIS — E785 Hyperlipidemia, unspecified: Secondary | ICD-10-CM

## 2018-08-25 DIAGNOSIS — I10 Essential (primary) hypertension: Secondary | ICD-10-CM | POA: Diagnosis not present

## 2018-08-25 DIAGNOSIS — I25118 Atherosclerotic heart disease of native coronary artery with other forms of angina pectoris: Secondary | ICD-10-CM

## 2018-08-25 DIAGNOSIS — Z6839 Body mass index (BMI) 39.0-39.9, adult: Secondary | ICD-10-CM

## 2018-08-25 MED ORDER — ALPRAZOLAM 1 MG PO TABS: 1.0000 mg | ORAL_TABLET | Freq: Three times a day (TID) | ORAL | 2 refills | Status: DC | PRN

## 2018-08-25 MED ORDER — METOPROLOL TARTRATE 25 MG PO TABS: 25.0000 mg | ORAL_TABLET | Freq: Two times a day (BID) | ORAL | 3 refills | Status: DC

## 2018-08-25 MED ORDER — ROSUVASTATIN CALCIUM 40 MG PO TABS: 40.0000 mg | ORAL_TABLET | Freq: Every day | ORAL | 3 refills | Status: DC

## 2018-08-25 NOTE — Progress Notes (Signed)
BP (!) 148/95   Pulse 87   Temp 98 F (36.7 C) (Oral)   Ht 5' 10"  (1.778 m)   Wt 270 lb 6.4 oz (122.7 kg)   BMI 38.80 kg/m    Subjective:    Patient ID: Francisco Allen, male    DOB: [birth year], 57 y.o.   MRN: [id number]  HPI: Francisco Allen is a 57 y.o. male presenting on 08/25/2018 for Hypertension (6 month follow up); Hyperlipidemia; Arthritis (Patient states that his friend takes Duesix and would like to know if it is something he could take); and Hip Pain (Left hip pain- Patient states it was severe tuesday and thursday)   HPI Hypertension Patient is currently on metoprolol, and their blood pressure today is 148/95. Patient denies any lightheadedness or dizziness. Patient denies headaches, blurred vision, chest pains, shortness of breath, or weakness. Denies any side effects from medication and is content with current medication.   Prediabetes Patient comes in today for recheck of his diabetes. Patient has been currently taking no medication but we are monitoring for now. Patient is not currently on an ACE inhibitor/ARB. Patient has not seen an ophthalmologist this year. Patient denies any issues with their feet.   Hyperlipidemia Patient is coming in for recheck of his hyperlipidemia. The patient is currently taking Crestor. They deny any issues with myalgias or history of liver damage from it. They deny any focal numbness or weakness or chest pain.  Patient has known CAD.  Anxiety Patient is coming in for anxiety recheck.  he has been on Xanax and says that has been working well for him to this point.  He had tried Wellbutrin but did not like the way that it made him feel.  He has tried one other antidepressant as well without success either.  He says a lot of his anxiety stems from being the only family member on his side left  Relevant past medical, surgical, family and social history reviewed and updated as indicated. Interim medical history since our last visit  reviewed. Allergies and medications reviewed and updated.  Review of Systems  Constitutional: Negative for chills and fever.  Eyes: Negative for visual disturbance.  Respiratory: Negative for shortness of breath and wheezing.   Cardiovascular: Negative for chest pain and leg swelling.  Musculoskeletal: Negative for back pain and gait problem.  Skin: Negative for rash.  Psychiatric/Behavioral: Negative for decreased concentration, dysphoric mood, self-injury, sleep disturbance and suicidal ideas. The patient is nervous/anxious.   All other systems reviewed and are negative.   Per HPI unless specifically indicated above   Allergies as of 08/25/2018      Reactions   Bee Venom Anaphylaxis, Swelling, Other (See Comments)   Anaphylactic shock   Penicillins Anaphylaxis, Swelling   Airway closes up Has patient had a PCN reaction causing immediate rash, facial/tongue/throat swelling, SOB or lightheadedness with hypotension: Yes Has patient had a PCN reaction causing severe rash involving mucus membranes or skin necrosis: Yes Has patient had a PCN reaction that required hospitalization: [name] Has patient had a PCN reaction occurring within the last 10 years: No If all of the above answers are "NO", then may proceed with Cephalosporin use.      Medication List        Accurate as of 08/25/18  4:43 PM. Always use your most recent med list.          ALPRAZolam 1 MG tablet Commonly known as:  XANAX Take 1 tablet (1 mg total)  by mouth 3 (three) times daily as needed for anxiety.   clobetasol 0.05 % external solution Commonly known as:  TEMOVATE Apply 1 application topically 2 (two) times daily.   Icosapent Ethyl 1 g Caps Take 2 g by mouth 2 (two) times daily.   MENS 50+ MULTI VITAMIN/MIN PO Take by mouth daily.   metoprolol tartrate 25 MG tablet Commonly known as:  LOPRESSOR Take 1 tablet (25 mg total) by mouth 2 (two) times daily.   rosuvastatin 40 MG tablet Commonly known as:   CRESTOR Take 1 tablet (40 mg total) by mouth daily.   VITAMIN D PO Take 1 capsule daily by mouth.          Objective:    BP (!) 148/95   Pulse 87   Temp 98 F (36.7 C) (Oral)   Ht 5' 10"  (1.778 m)   Wt 270 lb 6.4 oz (122.7 kg)   BMI 38.80 kg/m   Wt Readings from Last 3 Encounters:  08/25/18 270 lb 6.4 oz (122.7 kg)  08/02/18 268 lb (121.6 kg)  07/21/18 270 lb 9.6 oz (122.7 kg)    Physical Exam  Constitutional: He is oriented to person, place, and time. He appears well-developed and well-nourished. No distress.  Eyes: Conjunctivae are normal. No scleral icterus.  Neck: Neck supple. No thyromegaly present.  Cardiovascular: Normal rate, regular rhythm, normal heart sounds and intact distal pulses.  No murmur heard. Pulmonary/Chest: Effort normal and breath sounds normal. No respiratory distress. He has no wheezes.  Musculoskeletal: Normal range of motion. He exhibits no edema.  Lymphadenopathy:    He has no cervical adenopathy.  Neurological: He is alert and oriented to person, place, and time. Coordination normal.  Skin: Skin is warm and dry. No rash noted. He is not diaphoretic.  Psychiatric: He has a normal mood and affect. His behavior is normal.  Nursing note and vitals reviewed.     Assessment & Plan:   Problem List Items Addressed This Visit      Cardiovascular and Mediastinum   HTN (hypertension) - Primary   Relevant Medications   metoprolol tartrate (LOPRESSOR) 25 MG tablet   rosuvastatin (CRESTOR) 40 MG tablet     Other   GAD (generalized anxiety disorder)   Relevant Medications   ALPRAZolam (XANAX) 1 MG tablet   Hyperlipidemia   Relevant Medications   metoprolol tartrate (LOPRESSOR) 25 MG tablet   rosuvastatin (CRESTOR) 40 MG tablet   Other Relevant Orders   Lipid panel (Completed)   BMI 39.0-39.9,adult   Relevant Orders   Lipid panel (Completed)   Prediabetes   Relevant Orders   Bayer DCA Hb A1c Waived (Completed)    Other Visit Diagnoses     Coronary artery disease of native artery of native heart with stable angina pectoris (HCC)       Relevant Medications   metoprolol tartrate (LOPRESSOR) 25 MG tablet   rosuvastatin (CRESTOR) 40 MG tablet   Other Relevant Orders   CBC with Differential/Platelet (Completed)   CMP14+EGFR (Completed)   Need for immunization against influenza       Relevant Orders   Flu Vaccine QUAD 36+ mos IM (Completed)       Follow up plan: Return in about 3 months (around 11/25/2018), or if symptoms worsen or fail to improve, for Diabetes and anxiety recheck.  Counseling provided for all of the vaccine components Orders Placed This Encounter  Procedures  . Flu Vaccine QUAD 36+ mos IM  . Bayer DCA Hb  A1c Waived  . CBC with Differential/Platelet  . CMP14+EGFR  . Lipid panel    Caryl Pina, MD [facility] 08/25/2018, 4:43 PM

## 2018-08-27 ENCOUNTER — Other Ambulatory Visit: Payer: BLUE CROSS/BLUE SHIELD

## 2018-08-27 DIAGNOSIS — E785 Hyperlipidemia, unspecified: Secondary | ICD-10-CM | POA: Diagnosis not present

## 2018-08-27 DIAGNOSIS — Z6839 Body mass index (BMI) 39.0-39.9, adult: Secondary | ICD-10-CM

## 2018-08-27 DIAGNOSIS — I25118 Atherosclerotic heart disease of native coronary artery with other forms of angina pectoris: Secondary | ICD-10-CM

## 2018-08-27 DIAGNOSIS — R7303 Prediabetes: Secondary | ICD-10-CM

## 2018-08-28 LAB — CMP14+EGFR
ALK PHOS: 94 IU/L (ref 39–117)
ALT: 38 IU/L (ref 0–44)
AST: 32 IU/L (ref 0–40)
Albumin/Globulin Ratio: 1.7 (ref 1.2–2.2)
Albumin: 4.3 g/dL (ref 3.5–5.5)
BUN / CREAT RATIO: 13 (ref 9–20)
BUN: 12 mg/dL (ref 6–24)
Bilirubin Total: 0.8 mg/dL (ref 0.0–1.2)
CHLORIDE: 97 mmol/L (ref 96–106)
CO2: 25 mmol/L (ref 20–29)
Calcium: 9.5 mg/dL (ref 8.7–10.2)
Creatinine, Ser: 0.95 mg/dL (ref 0.76–1.27)
GFR calc Af Amer: 101 mL/min/{1.73_m2} (ref >59)
GFR calc non Af Amer: 87 mL/min/{1.73_m2} (ref >59)
GLUCOSE: 162 mg/dL — AB (ref 65–99)
Globulin, Total: 2.5 g/dL (ref 1.5–4.5)
Potassium: 4 mmol/L (ref 3.5–5.2)
Sodium: 138 mmol/L (ref 134–144)
Total Protein: 6.8 g/dL (ref 6.0–8.5)

## 2018-08-28 LAB — CBC WITH DIFFERENTIAL/PLATELET
BASOS ABS: 0.1 10*3/uL (ref 0.0–0.2)
Basos: 1 %
EOS (ABSOLUTE): 0.2 10*3/uL (ref 0.0–0.4)
Eos: 3 %
HEMOGLOBIN: 18.4 g/dL — AB (ref 13.0–17.7)
Hematocrit: 53.9 % — ABNORMAL HIGH (ref 37.5–51.0)
Immature Grans (Abs): 0.1 10*3/uL (ref 0.0–0.1)
Immature Granulocytes: 1 %
LYMPHS ABS: 2.1 10*3/uL (ref 0.7–3.1)
LYMPHS: 29 %
MCH: 30.8 pg (ref 26.6–33.0)
MCHC: 34.1 g/dL (ref 31.5–35.7)
MCV: 90 fL (ref 79–97)
MONOCYTES: 8 %
Monocytes Absolute: 0.6 10*3/uL (ref 0.1–0.9)
NEUTROS PCT: 58 %
Neutrophils Absolute: 4.3 10*3/uL (ref 1.4–7.0)
Platelets: 156 10*3/uL (ref 150–450)
RBC: 5.97 x10E6/uL — AB (ref 4.14–5.80)
RDW: 13.1 % (ref 12.3–15.4)
WBC: 7.2 10*3/uL (ref 3.4–10.8)

## 2018-08-28 LAB — LIPID PANEL
CHOL/HDL RATIO: 6 ratio — AB (ref 0.0–5.0)
Cholesterol, Total: 139 mg/dL (ref 100–199)
HDL: 23 mg/dL — ABNORMAL LOW (ref >39)
LDL CALC: 67 mg/dL (ref 0–99)
Triglycerides: 246 mg/dL — ABNORMAL HIGH (ref 0–149)
VLDL CHOLESTEROL CAL: 49 mg/dL — AB (ref 5–40)

## 2018-08-29 LAB — BAYER DCA HB A1C WAIVED: HB A1C: 7.4 % — AB (ref <7.0)

## 2018-09-01 ENCOUNTER — Telehealth: Payer: Self-pay | Admitting: Family Medicine

## 2018-09-01 DIAGNOSIS — E1169 Type 2 diabetes mellitus with other specified complication: Secondary | ICD-10-CM

## 2018-09-01 MED ORDER — METFORMIN HCL 500 MG PO TABS: 500.0000 mg | ORAL_TABLET | Freq: Two times a day (BID) | ORAL | 3 refills | Status: DC

## 2018-09-01 NOTE — Telephone Encounter (Signed)
Spoke with patient about his lab results and have sent metformin in form and put in the blood work so he can come in and do it before his next visit. Caryl Pina, MD [facility] 09/01/2018, 11:59 AM

## 2018-09-15 ENCOUNTER — Telehealth: Payer: Self-pay | Admitting: Family Medicine

## 2018-09-15 ENCOUNTER — Telehealth: Payer: Self-pay

## 2018-09-15 ENCOUNTER — Encounter: Payer: Self-pay | Admitting: Family Medicine

## 2018-09-15 MED ORDER — CIPROFLOXACIN HCL 500 MG PO TABS: 500.0000 mg | ORAL_TABLET | Freq: Two times a day (BID) | ORAL | 0 refills | Status: DC

## 2018-09-15 NOTE — Telephone Encounter (Signed)
Antibiotic sent to pharmacy.  

## 2018-09-15 NOTE — Telephone Encounter (Signed)
ABT sent to pharmacy per Dr. Warrick Parisian

## 2018-09-16 ENCOUNTER — Encounter: Payer: Self-pay | Admitting: Family Medicine

## 2018-11-15 ENCOUNTER — Telehealth: Payer: Self-pay | Admitting: Family Medicine

## 2018-11-15 MED ORDER — AZITHROMYCIN 250 MG PO TABS: ORAL_TABLET | ORAL | 0 refills | Status: DC

## 2018-11-15 NOTE — Telephone Encounter (Signed)
[place] husband - would like zpak for cough, congestion

## 2018-11-15 NOTE — Telephone Encounter (Signed)
Wife aware

## 2018-11-15 NOTE — Telephone Encounter (Signed)
I sent in a Z-Pak for the patient 

## 2018-11-16 ENCOUNTER — Ambulatory Visit: Payer: BLUE CROSS/BLUE SHIELD | Admitting: Family Medicine

## 2018-11-17 IMAGING — DX DG CHEST 2V
2 series · 2 of 2 positions shown · non-contrast
Comparison: None.

CLINICAL DATA: Chest pressure.  Hypertension.

EXAM:
CHEST  2 VIEW

[chest pa]
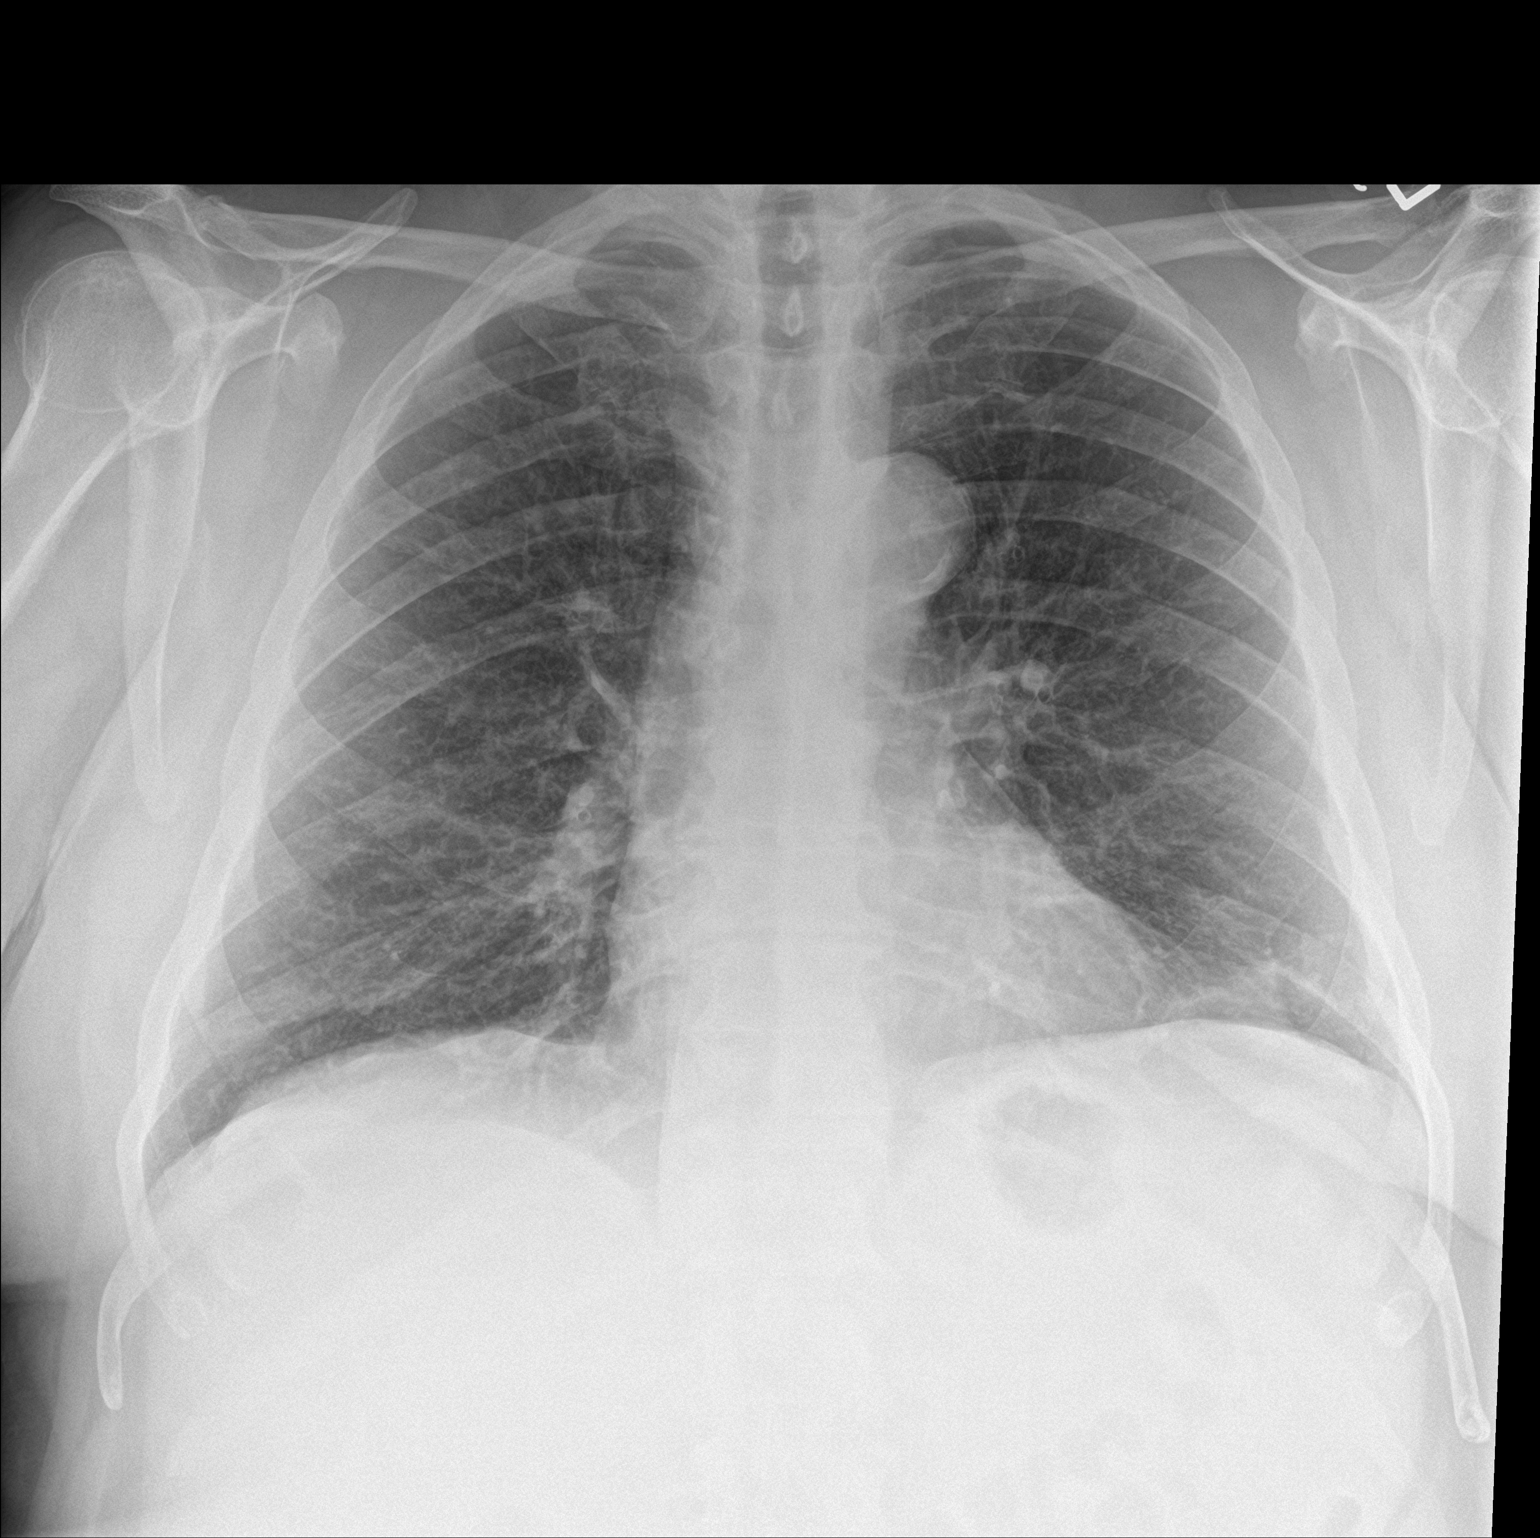

[chest lat]
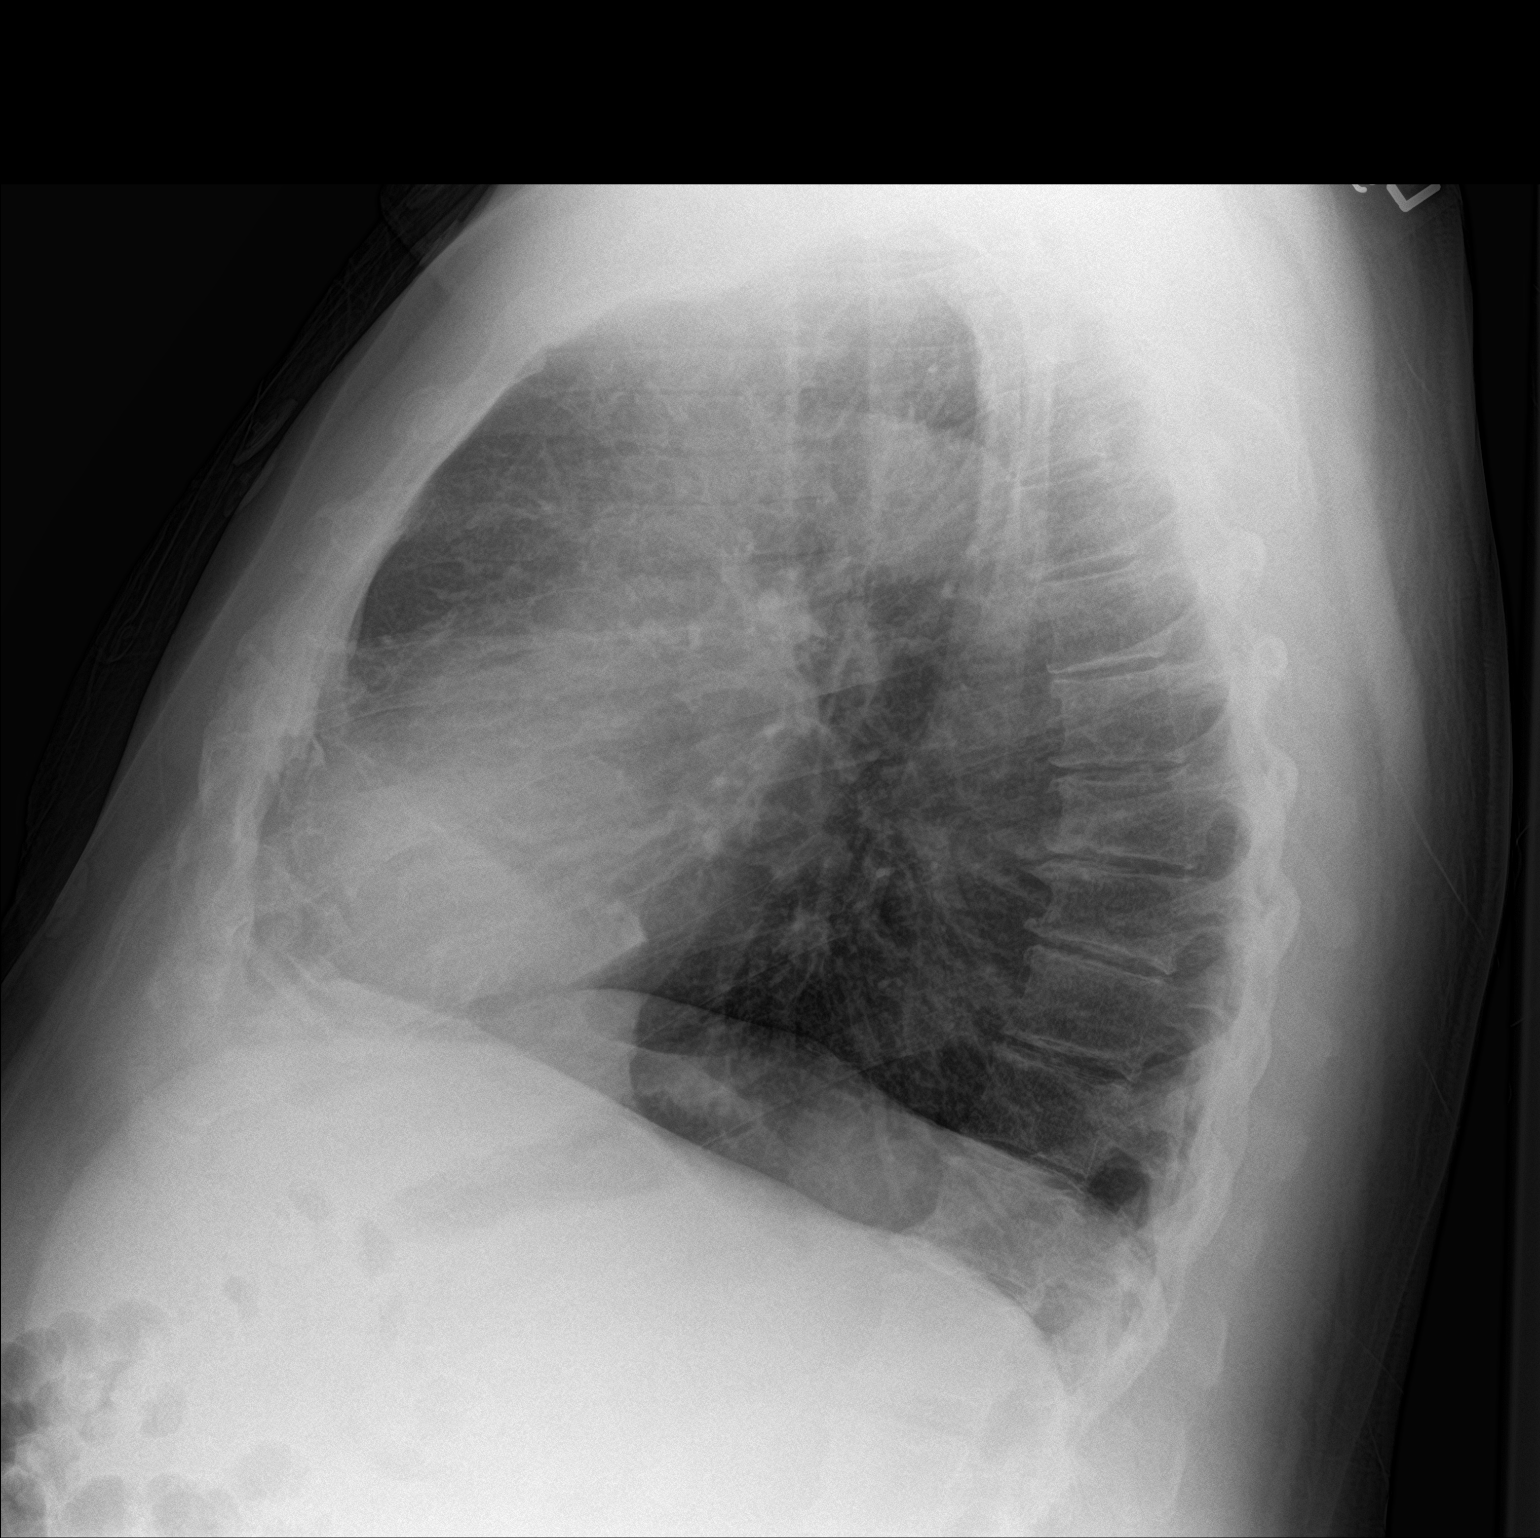

[2 of 2 positions shown; findings below may reference images not displayed]

FINDINGS: Minimal opacity in left base. The heart, hila, mediastinum, lungs,
and pleura are otherwise unremarkable
IMPRESSION: Minimal opacity in left base could represent atelectasis or early
infiltrate. Given history, I suspect atelectasis. Recommend
follow-up to resolution.

## 2018-11-30 ENCOUNTER — Encounter: Payer: Self-pay | Admitting: Family Medicine

## 2018-11-30 ENCOUNTER — Ambulatory Visit: Payer: BLUE CROSS/BLUE SHIELD | Admitting: Family Medicine

## 2018-11-30 VITALS — BP 140/89 | HR 73 | Temp 97.0°F | Ht 70.0 in | Wt 266.2 lb

## 2018-11-30 DIAGNOSIS — K5732 Diverticulitis of large intestine without perforation or abscess without bleeding: Secondary | ICD-10-CM | POA: Diagnosis not present

## 2018-11-30 DIAGNOSIS — F411 Generalized anxiety disorder: Secondary | ICD-10-CM

## 2018-11-30 MED ORDER — CIPROFLOXACIN HCL 500 MG PO TABS: 500.0000 mg | ORAL_TABLET | Freq: Two times a day (BID) | ORAL | 0 refills | Status: DC

## 2018-11-30 MED ORDER — METRONIDAZOLE 500 MG PO TABS: 500.0000 mg | ORAL_TABLET | Freq: Two times a day (BID) | ORAL | 0 refills | Status: DC

## 2018-11-30 MED ORDER — ALPRAZOLAM 1 MG PO TABS: 1.0000 mg | ORAL_TABLET | Freq: Three times a day (TID) | ORAL | 2 refills | Status: DC | PRN

## 2018-11-30 NOTE — Progress Notes (Signed)
BP 140/89   Pulse 73   Temp (!) 97 F (36.1 C) (Oral)   Ht 5\' 10"  (1.778 m)   Wt 266 lb 3.2 oz (120.7 kg)   BMI 38.20 kg/m    Subjective:    Patient ID: Francisco Allen, male    DOB: 1961-06-19, 58 y.o.   MRN: [id number]  HPI: Francisco Allen is a 58 y.o. male presenting on 11/30/2018 for Diverticulitis (Patient states it started this morning. Patient is having blood in his BM.); Arthritis; and Medication Refill (xanax)   HPI Lower abdominal pain and blood in stool Patient has been having lower abdominal pain and bloody stool that is been going on over the past half a day, he says is how he commonly gets diverticulitis.  He does complain of the lower abdominal pain and blood in his stool and diarrhea.  He denies any fevers or chills or flank pain.  He denies any nausea or vomiting.  Prior to this yesterday he was having normal bowel movements.  He had seen a gastroenterologist and they did not find anything wrong except the diverticulitis in the past.  Relevant past medical, surgical, family and social history reviewed and updated as indicated. Interim medical history since our last visit reviewed. Allergies and medications reviewed and updated.  Review of Systems  Constitutional: Negative for chills and fever.  Eyes: Negative for visual disturbance.  Respiratory: Negative for shortness of breath and wheezing.   Cardiovascular: Negative for chest pain and leg swelling.  Gastrointestinal: Positive for abdominal pain, blood in stool and diarrhea. Negative for constipation, nausea, rectal pain and vomiting.  Musculoskeletal: Negative for back pain and gait problem.  Skin: Negative for rash.  All other systems reviewed and are negative.   Per HPI unless specifically indicated above   Allergies as of 11/30/2018      Reactions   Bee Venom Anaphylaxis, Swelling, Other (See Comments)   Anaphylactic shock   Penicillins Anaphylaxis, Swelling   Airway closes up Has patient had a PCN  reaction causing immediate rash, facial/tongue/throat swelling, SOB or lightheadedness with hypotension: Yes Has patient had a PCN reaction causing severe rash involving mucus membranes or skin necrosis: Yes Has patient had a PCN reaction that required hospitalization: [name] Has patient had a PCN reaction occurring within the last 10 years: No If all of the above answers are "NO", then may proceed with Cephalosporin use.      Medication List       Accurate as of November 30, 2018 11:59 AM. Always use your most recent med list.        ALPRAZolam 1 MG tablet Commonly known as:  XANAX Take 1 tablet (1 mg total) by mouth 3 (three) times daily as needed for anxiety.   ciprofloxacin 500 MG tablet Commonly known as:  CIPRO Take 1 tablet (500 mg total) by mouth 2 (two) times daily.   clobetasol 0.05 % external solution Commonly known as:  TEMOVATE Apply 1 application topically 2 (two) times daily.   MENS 50+ MULTI VITAMIN/MIN PO Take by mouth daily.   metFORMIN 500 MG tablet Commonly known as:  GLUCOPHAGE Take 1 tablet (500 mg total) by mouth 2 (two) times daily with a meal.   metoprolol tartrate 25 MG tablet Commonly known as:  LOPRESSOR Take 1 tablet (25 mg total) by mouth 2 (two) times daily.   metroNIDAZOLE 500 MG tablet Commonly known as:  FLAGYL Take 1 tablet (500 mg total) by mouth 2 (two)  times daily.   rosuvastatin 40 MG tablet Commonly known as:  CRESTOR Take 1 tablet (40 mg total) by mouth daily.   VITAMIN D PO Take 1 capsule daily by mouth.          Objective:    BP 140/89   Pulse 73   Temp (!) 97 F (36.1 C) (Oral)   Ht 5\' 10"  (1.778 m)   Wt 266 lb 3.2 oz (120.7 kg)   BMI 38.20 kg/m   Wt Readings from Last 3 Encounters:  11/30/18 266 lb 3.2 oz (120.7 kg)  08/25/18 270 lb 6.4 oz (122.7 kg)  08/02/18 268 lb (121.6 kg)    Physical Exam Vitals signs and nursing note reviewed.  Constitutional:      General: He is not in acute distress.    Appearance:  He is well-developed. He is not diaphoretic.  Eyes:     General: No scleral icterus.    Conjunctiva/sclera: Conjunctivae normal.  Neck:     Musculoskeletal: Neck supple.     Thyroid: No thyromegaly.  Cardiovascular:     Rate and Rhythm: Normal rate and regular rhythm.     Heart sounds: Normal heart sounds. No murmur.  Pulmonary:     Effort: Pulmonary effort is normal. No respiratory distress.     Breath sounds: Normal breath sounds. No wheezing.  Abdominal:     General: Abdomen is flat. Bowel sounds are normal.     Tenderness: There is abdominal tenderness (Left lower quadrant abdominal tenderness, no rebound or guarding). There is no right CVA tenderness, left CVA tenderness, guarding or rebound.  Musculoskeletal: Normal range of motion.  Lymphadenopathy:     Cervical: No cervical adenopathy.  Skin:    General: Skin is warm and dry.     Findings: No rash.  Neurological:     Mental Status: He is alert and oriented to person, place, and time.     Coordination: Coordination normal.  Psychiatric:        Behavior: Behavior normal.         Assessment & Plan:   Problem List Items Addressed This Visit      Digestive   Diverticulitis of colon - Primary   Relevant Medications   ciprofloxacin (CIPRO) 500 MG tablet   metroNIDAZOLE (FLAGYL) 500 MG tablet     Other   GAD (generalized anxiety disorder)   Relevant Medications   ALPRAZolam (XANAX) 1 MG tablet      Patient needs a refill on his anxiety medication today. Follow up plan: Return if symptoms worsen or fail to improve.  Counseling provided for all of the vaccine components No orders of the defined types were placed in this encounter.   Caryl Pina, MD [facility] 11/30/2018, 11:59 AM

## 2018-12-08 ENCOUNTER — Ambulatory Visit: Payer: BLUE CROSS/BLUE SHIELD | Admitting: Family Medicine

## 2018-12-16 ENCOUNTER — Ambulatory Visit: Payer: BLUE CROSS/BLUE SHIELD | Admitting: Family Medicine

## 2018-12-16 ENCOUNTER — Encounter: Payer: Self-pay | Admitting: Family Medicine

## 2018-12-16 VITALS — BP 146/87 | HR 76 | Temp 97.1°F | Ht 70.0 in | Wt 262.8 lb

## 2018-12-16 DIAGNOSIS — E785 Hyperlipidemia, unspecified: Secondary | ICD-10-CM | POA: Diagnosis not present

## 2018-12-16 DIAGNOSIS — I25118 Atherosclerotic heart disease of native coronary artery with other forms of angina pectoris: Secondary | ICD-10-CM

## 2018-12-16 DIAGNOSIS — R7303 Prediabetes: Secondary | ICD-10-CM | POA: Diagnosis not present

## 2018-12-16 DIAGNOSIS — I10 Essential (primary) hypertension: Secondary | ICD-10-CM | POA: Diagnosis not present

## 2018-12-16 LAB — BAYER DCA HB A1C WAIVED: HB A1C: 6.6 % (ref <7.0)

## 2018-12-16 MED ORDER — METOPROLOL TARTRATE 25 MG PO TABS: 25.0000 mg | ORAL_TABLET | Freq: Two times a day (BID) | ORAL | 3 refills | Status: DC

## 2018-12-16 MED ORDER — ROSUVASTATIN CALCIUM 40 MG PO TABS: 40.0000 mg | ORAL_TABLET | Freq: Every day | ORAL | 3 refills | Status: DC

## 2018-12-16 MED ORDER — METFORMIN HCL 500 MG PO TABS: 500.0000 mg | ORAL_TABLET | Freq: Two times a day (BID) | ORAL | 3 refills | Status: DC

## 2018-12-16 NOTE — Progress Notes (Signed)
BP (!) 146/87   Pulse 76   Temp (!) 97.1 F (36.2 C) (Oral)   Ht 5\' 10"  (1.778 m)   Wt 262 lb 12.8 oz (119.2 kg)   BMI 37.71 kg/m    Subjective:    Patient ID: Francisco Allen, male    DOB: 28-Sep-1961, 58 y.o.   MRN: [id number]  HPI: Francisco Allen is a 58 y.o. male presenting on 12/16/2018 for Hypertension (3 month follow up) and Hyperlipidemia   HPI Prediabetes  Patient comes in today for recheck of his diabetes. Patient has been currently taking metformin. Patient is not currently on an Francisco inhibitor/ARB. Patient has not seen an ophthalmologist this year. Patient denies any issues with their feet.   Hyperlipidemia Patient is coming in for recheck of his hyperlipidemia. The patient is currently taking Crestor. They deny any issues with myalgias or history of liver damage from it. They deny any focal numbness or weakness or chest pain.   Hypertension Patient is currently on metoprolol, and their blood pressure today is 146/87. Patient denies any lightheadedness or dizziness. Patient denies headaches, blurred vision, chest pains, shortness of breath, or weakness. Denies any side effects from medication and is content with current medication.   Relevant past medical, surgical, family and social history reviewed and updated as indicated. Interim medical history since our last visit reviewed. Allergies and medications reviewed and updated.  Review of Systems  Constitutional: Negative for chills and fever.  Respiratory: Negative for shortness of breath and wheezing.   Cardiovascular: Negative for chest pain and leg swelling.  Musculoskeletal: Negative for back pain and gait problem.  Skin: Negative for rash.  Neurological: Negative for dizziness, weakness and numbness.  All other systems reviewed and are negative.   Per HPI unless specifically indicated above   Allergies as of 12/16/2018      Reactions   Bee Venom Anaphylaxis, Swelling, Other (See Comments)   Anaphylactic  shock   Penicillins Anaphylaxis, Swelling   Airway closes up Has patient had a PCN reaction causing immediate rash, facial/tongue/throat swelling, SOB or lightheadedness with hypotension: Yes Has patient had a PCN reaction causing severe rash involving mucus membranes or skin necrosis: Yes Has patient had a PCN reaction that required hospitalization: [name] Has patient had a PCN reaction occurring within the last 10 years: No If all of the above answers are "NO", then may proceed with Cephalosporin use.      Medication List       Accurate as of December 16, 2018  4:25 PM. Always use your most recent med list.        ALPRAZolam 1 MG tablet Commonly known as:  XANAX Take 1 tablet (1 mg total) by mouth 3 (three) times daily as needed for anxiety.   clobetasol 0.05 % external solution Commonly known as:  TEMOVATE Apply 1 application topically 2 (two) times daily.   MENS 50+ MULTI VITAMIN/MIN PO Take by mouth daily.   metFORMIN 500 MG tablet Commonly known as:  GLUCOPHAGE Take 1 tablet (500 mg total) by mouth 2 (two) times daily with a meal.   metoprolol tartrate 25 MG tablet Commonly known as:  LOPRESSOR Take 1 tablet (25 mg total) by mouth 2 (two) times daily.   rosuvastatin 40 MG tablet Commonly known as:  CRESTOR Take 1 tablet (40 mg total) by mouth daily.   VITAMIN D PO Take 1 capsule daily by mouth.          Objective:  BP (!) 146/87   Pulse 76   Temp (!) 97.1 F (36.2 C) (Oral)   Ht 5\' 10"  (1.778 m)   Wt 262 lb 12.8 oz (119.2 kg)   BMI 37.71 kg/m   Wt Readings from Last 3 Encounters:  12/16/18 262 lb 12.8 oz (119.2 kg)  11/30/18 266 lb 3.2 oz (120.7 kg)  08/25/18 270 lb 6.4 oz (122.7 kg)    Physical Exam Vitals signs and nursing note reviewed.  Constitutional:      General: He is not in acute distress.    Appearance: He is well-developed. He is not diaphoretic.  Eyes:     General: No scleral icterus.    Conjunctiva/sclera: Conjunctivae normal.    Neck:     Musculoskeletal: Neck supple.     Thyroid: No thyromegaly.  Cardiovascular:     Rate and Rhythm: Normal rate and regular rhythm.     Heart sounds: Normal heart sounds. No murmur.  Pulmonary:     Effort: Pulmonary effort is normal. No respiratory distress.     Breath sounds: Normal breath sounds. No wheezing.  Musculoskeletal: Normal range of motion.  Lymphadenopathy:     Cervical: No cervical adenopathy.  Skin:    General: Skin is warm and dry.     Findings: No rash.  Neurological:     Mental Status: He is alert and oriented to person, place, and time.     Coordination: Coordination normal.  Psychiatric:        Behavior: Behavior normal.        Assessment & Plan:   Problem List Items Addressed This Visit      Cardiovascular and Mediastinum   HTN (hypertension)   Relevant Medications   metoprolol tartrate (LOPRESSOR) 25 MG tablet   rosuvastatin (CRESTOR) 40 MG tablet   Other Relevant Orders   CBC with Differential/Platelet   Microalbumin / creatinine urine ratio     Other   Hyperlipidemia   Relevant Medications   metoprolol tartrate (LOPRESSOR) 25 MG tablet   rosuvastatin (CRESTOR) 40 MG tablet   Other Relevant Orders   Lipid panel   Morbid obesity (HCC)   Relevant Medications   metFORMIN (GLUCOPHAGE) 500 MG tablet   Prediabetes - Primary   Relevant Orders   Bayer DCA Hb A1c Waived   Microalbumin / creatinine urine ratio    Other Visit Diagnoses    Coronary artery disease of native artery of native heart with stable angina pectoris (HCC)       Relevant Medications   metoprolol tartrate (LOPRESSOR) 25 MG tablet   rosuvastatin (CRESTOR) 40 MG tablet   Other Relevant Orders   CBC with Differential/Platelet      Metoprolol and Crestor and metformin will be continued and check A1c and urine Follow up plan: Return in about 3 months (around 03/16/2019), or if symptoms worsen or fail to improve, for Prediabetes and cholesterol and  hypertension.  Counseling provided for all of the vaccine components Orders Placed This Encounter  Procedures  . CBC with Differential/Platelet  . Lipid panel  . Bayer DCA Hb A1c Waived  . Microalbumin / creatinine urine ratio    Caryl Pina, MD [facility] 12/16/2018, 4:25 PM

## 2018-12-17 LAB — CBC WITH DIFFERENTIAL/PLATELET
BASOS ABS: 0.1 10*3/uL (ref 0.0–0.2)
Basos: 1 %
EOS (ABSOLUTE): 0.2 10*3/uL (ref 0.0–0.4)
Eos: 2 %
Hematocrit: 51.5 % — ABNORMAL HIGH (ref 37.5–51.0)
Hemoglobin: 17.6 g/dL (ref 13.0–17.7)
IMMATURE GRANS (ABS): 0 10*3/uL (ref 0.0–0.1)
IMMATURE GRANULOCYTES: 0 %
LYMPHS: 31 %
Lymphocytes Absolute: 2.7 10*3/uL (ref 0.7–3.1)
MCH: 29.6 pg (ref 26.6–33.0)
MCHC: 34.2 g/dL (ref 31.5–35.7)
MCV: 87 fL (ref 79–97)
MONOS ABS: 0.6 10*3/uL (ref 0.1–0.9)
Monocytes: 7 %
NEUTROS PCT: 59 %
Neutrophils Absolute: 5.3 10*3/uL (ref 1.4–7.0)
PLATELETS: 167 10*3/uL (ref 150–450)
RBC: 5.94 x10E6/uL — AB (ref 4.14–5.80)
RDW: 12.7 % (ref 11.6–15.4)
WBC: 8.9 10*3/uL (ref 3.4–10.8)

## 2018-12-17 LAB — LIPID PANEL
CHOLESTEROL TOTAL: 146 mg/dL (ref 100–199)
Chol/HDL Ratio: 5.8 ratio — ABNORMAL HIGH (ref 0.0–5.0)
HDL: 25 mg/dL — AB (ref >39)
LDL Calculated: 77 mg/dL (ref 0–99)
Triglycerides: 221 mg/dL — ABNORMAL HIGH (ref 0–149)
VLDL CHOLESTEROL CAL: 44 mg/dL — AB (ref 5–40)

## 2018-12-17 LAB — MICROALBUMIN / CREATININE URINE RATIO
Creatinine, Urine: 124.1 mg/dL
Microalb/Creat Ratio: 329 mg/g creat — ABNORMAL HIGH (ref 0–29)
Microalbumin, Urine: 408.1 ug/mL

## 2018-12-19 ENCOUNTER — Other Ambulatory Visit: Payer: Self-pay

## 2018-12-19 MED ORDER — LISINOPRIL 10 MG PO TABS: 10.0000 mg | ORAL_TABLET | Freq: Every day | ORAL | 3 refills | Status: DC

## 2018-12-19 NOTE — Progress Notes (Signed)
Rx sent per Dr. Warrick Parisian

## 2018-12-22 ENCOUNTER — Encounter: Payer: Self-pay | Admitting: *Deleted

## 2018-12-22 ENCOUNTER — Telehealth: Payer: Self-pay | Admitting: Family Medicine

## 2018-12-22 MED ORDER — METRONIDAZOLE 500 MG PO TABS: 500.0000 mg | ORAL_TABLET | Freq: Two times a day (BID) | ORAL | 0 refills | Status: DC

## 2018-12-22 MED ORDER — CIPROFLOXACIN HCL 500 MG PO TABS: 500.0000 mg | ORAL_TABLET | Freq: Two times a day (BID) | ORAL | 0 refills | Status: DC

## 2018-12-22 NOTE — Telephone Encounter (Signed)
I sent Cipro and Flagyl for the patient for diverticulitis, he is getting this more frequently so if it continues we may need to reconsider going back to gastroenterology.  Also please do a note for work for him

## 2018-12-22 NOTE — Telephone Encounter (Signed)
Wife aware

## 2018-12-22 NOTE — Telephone Encounter (Signed)
What symptoms do you have? Passing blood and need rx called in. need note for work  How long have you been sick? This morning had to come home from work  Have you been seen for this problem? Two weeks ago  If your provider decides to give you a prescription, which pharmacy would you like for it to be sent to? [facility]   Patient informed that this information will be sent to the clinical staff for review and that they should receive a follow up call.

## 2019-01-23 ENCOUNTER — Ambulatory Visit: Payer: BLUE CROSS/BLUE SHIELD | Admitting: Family Medicine

## 2019-01-23 ENCOUNTER — Encounter: Payer: Self-pay | Admitting: Family Medicine

## 2019-01-23 ENCOUNTER — Other Ambulatory Visit: Payer: Self-pay

## 2019-01-23 VITALS — BP 160/86 | HR 67 | Temp 96.9°F | Ht 70.0 in | Wt 255.6 lb

## 2019-01-23 DIAGNOSIS — K5792 Diverticulitis of intestine, part unspecified, without perforation or abscess without bleeding: Secondary | ICD-10-CM

## 2019-01-23 DIAGNOSIS — R809 Proteinuria, unspecified: Secondary | ICD-10-CM | POA: Diagnosis not present

## 2019-01-23 MED ORDER — SULFAMETHOXAZOLE-TRIMETHOPRIM 800-160 MG PO TABS: 1.0000 | ORAL_TABLET | Freq: Two times a day (BID) | ORAL | 0 refills | Status: DC

## 2019-01-23 MED ORDER — METRONIDAZOLE 500 MG PO TABS: 500.0000 mg | ORAL_TABLET | Freq: Two times a day (BID) | ORAL | 0 refills | Status: DC

## 2019-01-23 NOTE — Progress Notes (Signed)
BP (!) 160/86   Pulse 67   Temp (!) 96.9 F (36.1 C) (Oral)   Ht 5' 10"  (1.778 m)   Wt 255 lb 9.6 oz (115.9 kg)   BMI 36.67 kg/m    Subjective:    Patient ID: Francisco Allen, male    DOB: 1961/07/05, 58 y.o.   MRN: [id number]  HPI: Francisco Allen is a 58 y.o. male presenting on 01/23/2019 for Diarrhea (Patient states it started today) and Blood In Stools   HPI Patient comes in complaining of lower abdominal pain and blood in stool that just started this morning.  He has recurrent diverticulitis when he gets like this usually needs to be treated with antibiotics.  He has been having this more frequently over the past few months and thinks is related to stress and anxiety.  Patient denies any fevers or chills but has had couple episodes of blood in his stool along with left lower quadrant abdominal pain and nausea.  He denies any vomiting.  Relevant past medical, surgical, family and social history reviewed and updated as indicated. Interim medical history since our last visit reviewed. Allergies and medications reviewed and updated.  Review of Systems  Constitutional: Negative for chills and fever.  Respiratory: Negative for shortness of breath and wheezing.   Cardiovascular: Negative for chest pain and leg swelling.  Gastrointestinal: Positive for abdominal pain, blood in stool and diarrhea. Negative for nausea and vomiting.  Genitourinary: Negative for dysuria.  Musculoskeletal: Negative for back pain and gait problem.  Skin: Negative for rash.  All other systems reviewed and are negative.   Per HPI unless specifically indicated above        Objective:    BP (!) 160/86   Pulse 67   Temp (!) 96.9 F (36.1 C) (Oral)   Ht 5' 10"  (1.778 m)   Wt 255 lb 9.6 oz (115.9 kg)   BMI 36.67 kg/m   Wt Readings from Last 3 Encounters:  01/23/19 255 lb 9.6 oz (115.9 kg)  12/16/18 262 lb 12.8 oz (119.2 kg)  11/30/18 266 lb 3.2 oz (120.7 kg)    Physical Exam Vitals signs and  nursing note reviewed.  Constitutional:      General: He is not in acute distress.    Appearance: He is well-developed. He is not diaphoretic.  Eyes:     General: No scleral icterus.    Conjunctiva/sclera: Conjunctivae normal.  Neck:     Thyroid: No thyromegaly.  Cardiovascular:     Rate and Rhythm: Normal rate and regular rhythm.     Heart sounds: Normal heart sounds. No murmur.  Pulmonary:     Effort: Pulmonary effort is normal. No respiratory distress.     Breath sounds: Normal breath sounds. No wheezing.  Abdominal:     General: Abdomen is flat. Bowel sounds are normal. There is no distension.     Tenderness: There is abdominal tenderness (Left lower quadrant abdominal tenderness). There is no right CVA tenderness, left CVA tenderness, guarding or rebound.  Musculoskeletal: Normal range of motion.  Skin:    General: Skin is warm and dry.     Findings: No rash.  Neurological:     Mental Status: He is alert and oriented to person, place, and time.     Coordination: Coordination normal.  Psychiatric:        Behavior: Behavior normal.         Assessment & Plan:   Problem List Items Addressed This Visit  None    Visit Diagnoses    Diverticulitis    -  Primary   Relevant Medications   metroNIDAZOLE (FLAGYL) 500 MG tablet   sulfamethoxazole-trimethoprim (BACTRIM DS,SEPTRA DS) 800-160 MG tablet   Microalbuminuria       Relevant Orders   BMP8+EGFR       Follow up plan: Return in about 5 weeks (around 02/27/2019), or if symptoms worsen or fail to improve, for Diabetes recheck.  Counseling provided for all of the vaccine components Orders Placed This Encounter  Procedures  . BMP8+EGFR    Caryl Pina, MD [facility] 01/23/2019, 1:18 PM

## 2019-01-24 LAB — BMP8+EGFR
BUN/Creatinine Ratio: 12 (ref 9–20)
BUN: 12 mg/dL (ref 6–24)
CO2: 25 mmol/L (ref 20–29)
Calcium: 9.3 mg/dL (ref 8.7–10.2)
Chloride: 99 mmol/L (ref 96–106)
Creatinine, Ser: 1.03 mg/dL (ref 0.76–1.27)
GFR calc Af Amer: 91 mL/min/{1.73_m2} (ref >59)
GFR, EST NON AFRICAN AMERICAN: 79 mL/min/{1.73_m2} (ref >59)
Glucose: 288 mg/dL — ABNORMAL HIGH (ref 65–99)
Potassium: 4.3 mmol/L (ref 3.5–5.2)
Sodium: 139 mmol/L (ref 134–144)

## 2019-03-17 ENCOUNTER — Encounter: Payer: Self-pay | Admitting: Family Medicine

## 2019-03-17 ENCOUNTER — Other Ambulatory Visit: Payer: Self-pay

## 2019-03-17 ENCOUNTER — Ambulatory Visit: Payer: BLUE CROSS/BLUE SHIELD | Admitting: Family Medicine

## 2019-03-17 VITALS — BP 132/82 | HR 62 | Temp 97.7°F | Ht 70.0 in | Wt 266.8 lb

## 2019-03-17 DIAGNOSIS — E1129 Type 2 diabetes mellitus with other diabetic kidney complication: Secondary | ICD-10-CM | POA: Diagnosis not present

## 2019-03-17 DIAGNOSIS — F411 Generalized anxiety disorder: Secondary | ICD-10-CM | POA: Diagnosis not present

## 2019-03-17 DIAGNOSIS — R809 Proteinuria, unspecified: Secondary | ICD-10-CM

## 2019-03-17 DIAGNOSIS — E782 Mixed hyperlipidemia: Secondary | ICD-10-CM

## 2019-03-17 DIAGNOSIS — I10 Essential (primary) hypertension: Secondary | ICD-10-CM

## 2019-03-17 LAB — BAYER DCA HB A1C WAIVED: HB A1C (BAYER DCA - WAIVED): 6.5 % (ref <7.0)

## 2019-03-17 MED ORDER — ALPRAZOLAM 1 MG PO TABS: 1.0000 mg | ORAL_TABLET | Freq: Three times a day (TID) | ORAL | 2 refills | Status: DC | PRN

## 2019-03-17 NOTE — Progress Notes (Signed)
 BP 132/82   Pulse 62   Temp 97.7 F (36.5 C) (Oral)   Ht 5' 10" (1.778 m)   Wt 266 lb 12.8 oz (121 kg)   BMI 38.28 kg/m    Subjective:   Patient ID: Francisco Allen, male    DOB: 04/05/1961, 58 y.o.   MRN: [id number]  HPI: Francisco Allen is a 58 y.o. male presenting on 03/17/2019 for Hyperlipidemia (6 month follow up) and Anxiety   HPI Type 2 diabetes mellitus Patient comes in today for recheck of his diabetes. Patient has been currently taking metformin. Patient is currently on an ACE inhibitor/ARB. Patient has not seen an ophthalmologist this year. Patient denies any issues with their feet.   Hypertension Patient is currently on lisinopril and metoprolol, and their blood pressure today is 132/82. Patient denies any lightheadedness or dizziness. Patient denies headaches, blurred vision, chest pains, shortness of breath, or weakness. Denies any side effects from medication and is content with current medication.   Hyperlipidemia Patient is coming in for recheck of his hyperlipidemia. The patient is currently taking Crestor. They deny any issues with myalgias or history of liver damage from it. They deny any focal numbness or weakness or chest pain.  Anxiety  Patient is coming in for recheck of anxiety as well.  He currently takes alprazolam 1 mg 3 times daily and usually gets 90/month with 2 refills.  Patient says his mood is been doing fine and is back in work.  Patient denies any suicidal ideations or thoughts for himself.  He did have some trouble when he is out of work for couple weeks but now is back and doing much better.  Drug database reviewed and consistent with refills.  Relevant past medical, surgical, family and social history reviewed and updated as indicated. Interim medical history since our last visit reviewed. Allergies and medications reviewed and updated.  Review of Systems  Constitutional: Negative for chills and fever.  Respiratory: Negative for shortness of  breath and wheezing.   Cardiovascular: Negative for chest pain and leg swelling.  Musculoskeletal: Negative for back pain and gait problem.  Skin: Negative for rash.  Neurological: Negative for dizziness, weakness, light-headedness, numbness and headaches.  Psychiatric/Behavioral: Negative for decreased concentration, dysphoric mood, self-injury, sleep disturbance and suicidal ideas. The patient is nervous/anxious.   All other systems reviewed and are negative.   Per HPI unless specifically indicated above   Allergies as of 03/17/2019      Reactions   Bee Venom Anaphylaxis, Swelling, Other (See Comments)   Anaphylactic shock   Penicillins Anaphylaxis, Swelling   Airway closes up Has patient had a PCN reaction causing immediate rash, facial/tongue/throat swelling, SOB or lightheadedness with hypotension: Yes Has patient had a PCN reaction causing severe rash involving mucus membranes or skin necrosis: Yes Has patient had a PCN reaction that required hospitalization: Ye Has patient had a PCN reaction occurring within the last 10 years: No If all of the above answers are "NO", then may proceed with Cephalosporin use.      Medication List       Accurate as of Mar 17, 2019  1:07 PM. If you have any questions, ask your nurse or doctor.        STOP taking these medications   metroNIDAZOLE 500 MG tablet Commonly known as:  Flagyl Stopped by:  Joshua A Dettinger, MD   sulfamethoxazole-trimethoprim 800-160 MG tablet Commonly known as:  BACTRIM DS Stopped by:  Joshua A Dettinger, MD       TAKE these medications   ALPRAZolam 1 MG tablet Commonly known as:  XANAX Take 1 tablet (1 mg total) by mouth 3 (three) times daily as needed for anxiety.   clobetasol 0.05 % external solution Commonly known as:  TEMOVATE Apply 1 application topically 2 (two) times daily.   lisinopril 10 MG tablet Commonly known as:  ZESTRIL Take 1 tablet (10 mg total) by mouth daily.   MENS 50+ MULTI  VITAMIN/MIN PO Take by mouth daily.   metFORMIN 500 MG tablet Commonly known as:  GLUCOPHAGE Take 1 tablet (500 mg total) by mouth 2 (two) times daily with a meal.   metoprolol tartrate 25 MG tablet Commonly known as:  LOPRESSOR Take 1 tablet (25 mg total) by mouth 2 (two) times daily.   rosuvastatin 40 MG tablet Commonly known as:  Crestor Take 1 tablet (40 mg total) by mouth daily.   VITAMIN D PO Take 1 capsule daily by mouth.        Objective:   BP 132/82   Pulse 62   Temp 97.7 F (36.5 C) (Oral)   Ht 5' 10" (1.778 m)   Wt 266 lb 12.8 oz (121 kg)   BMI 38.28 kg/m   Wt Readings from Last 3 Encounters:  03/17/19 266 lb 12.8 oz (121 kg)  01/23/19 255 lb 9.6 oz (115.9 kg)  12/16/18 262 lb 12.8 oz (119.2 kg)    Physical Exam Vitals signs and nursing note reviewed.  Constitutional:      General: He is not in acute distress.    Appearance: He is well-developed. He is not diaphoretic.  Eyes:     General: No scleral icterus.    Conjunctiva/sclera: Conjunctivae normal.  Neck:     Musculoskeletal: Neck supple.     Thyroid: No thyromegaly.  Cardiovascular:     Rate and Rhythm: Normal rate and regular rhythm.     Heart sounds: Normal heart sounds. No murmur.  Pulmonary:     Effort: Pulmonary effort is normal. No respiratory distress.     Breath sounds: Normal breath sounds. No wheezing.  Musculoskeletal: Normal range of motion.  Lymphadenopathy:     Cervical: No cervical adenopathy.  Skin:    General: Skin is warm and dry.     Findings: No rash.  Neurological:     Mental Status: He is alert and oriented to person, place, and time.     Coordination: Coordination normal.     Comments: Small linear abrasion in 2 spots on left anterior leg from dog chain, no signs of erythema or infection currently  Psychiatric:        Mood and Affect: Mood normal.        Behavior: Behavior normal.        Thought Content: Thought content normal.        Judgment: Judgment normal.        Assessment & Plan:   Problem List Items Addressed This Visit      Cardiovascular and Mediastinum   HTN (hypertension)   Relevant Orders   CMP14+EGFR     Endocrine   Controlled type 2 diabetes mellitus with microalbuminuria ([place]) - Primary   Relevant Orders   CBC with Differential/Platelet   Bayer DCA Hb A1c Waived     Other   GAD (generalized anxiety disorder)   Relevant Medications   ALPRAZolam (XANAX) 1 MG tablet   Hyperlipidemia   Relevant Orders   Lipid panel   Morbid obesity ([place])    Will  check blood work and continue current medications for now, refilled alprazolam   Follow up plan: Return in about 3 months (around 06/17/2019), or if symptoms worsen or fail to improve, for Anxiety and diabetes.  Counseling provided for all of the vaccine components No orders of the defined types were placed in this encounter.   Caryl Pina, MD [facility] 03/17/2019, 1:07 PM

## 2019-03-18 LAB — CMP14+EGFR
ALT: 34 IU/L (ref 0–44)
AST: 24 IU/L (ref 0–40)
Albumin/Globulin Ratio: 2 (ref 1.2–2.2)
Albumin: 4.5 g/dL (ref 3.8–4.9)
Alkaline Phosphatase: 67 IU/L (ref 39–117)
BUN/Creatinine Ratio: 15 (ref 10–24)
BUN: 13 mg/dL (ref 8–27)
Bilirubin Total: 0.9 mg/dL (ref 0.0–1.2)
CO2: 23 mmol/L (ref 20–29)
Calcium: 9.5 mg/dL (ref 8.6–10.2)
Chloride: 100 mmol/L (ref 96–106)
Creatinine, Ser: 0.85 mg/dL (ref 0.76–1.27)
GFR calc Af Amer: 109 mL/min/{1.73_m2} (ref >59)
GFR calc non Af Amer: 95 mL/min/{1.73_m2} (ref >59)
Globulin, Total: 2.3 g/dL (ref 1.5–4.5)
Glucose: 91 mg/dL (ref 65–99)
Potassium: 4.2 mmol/L (ref 3.5–5.2)
Sodium: 140 mmol/L (ref 134–144)
Total Protein: 6.8 g/dL (ref 6.0–8.5)

## 2019-03-18 LAB — CBC WITH DIFFERENTIAL/PLATELET
Basophils Absolute: 0.1 10*3/uL (ref 0.0–0.2)
Basos: 1 %
EOS (ABSOLUTE): 0.2 10*3/uL (ref 0.0–0.4)
Eos: 2 %
Hematocrit: 49.1 % (ref 37.5–51.0)
Hemoglobin: 17.2 g/dL (ref 13.0–17.7)
Immature Grans (Abs): 0.1 10*3/uL (ref 0.0–0.1)
Immature Granulocytes: 1 %
Lymphocytes Absolute: 2.8 10*3/uL (ref 0.7–3.1)
Lymphs: 31 %
MCH: 31.4 pg (ref 26.6–33.0)
MCHC: 35 g/dL (ref 31.5–35.7)
MCV: 90 fL (ref 79–97)
Monocytes Absolute: 0.7 10*3/uL (ref 0.1–0.9)
Monocytes: 8 %
Neutrophils Absolute: 5.2 10*3/uL (ref 1.4–7.0)
Neutrophils: 57 %
Platelets: 145 10*3/uL — ABNORMAL LOW (ref 150–450)
RBC: 5.48 x10E6/uL (ref 4.14–5.80)
RDW: 13.5 % (ref 11.6–15.4)
WBC: 9 10*3/uL (ref 3.4–10.8)

## 2019-03-18 LAB — LIPID PANEL
Chol/HDL Ratio: 5.8 ratio — ABNORMAL HIGH (ref 0.0–5.0)
Cholesterol, Total: 128 mg/dL (ref 100–199)
HDL: 22 mg/dL — ABNORMAL LOW (ref >39)
LDL Calculated: 44 mg/dL (ref 0–99)
Triglycerides: 312 mg/dL — ABNORMAL HIGH (ref 0–149)
VLDL Cholesterol Cal: 62 mg/dL — ABNORMAL HIGH (ref 5–40)

## 2019-03-20 ENCOUNTER — Ambulatory Visit: Payer: BLUE CROSS/BLUE SHIELD | Admitting: Family Medicine

## 2019-03-30 ENCOUNTER — Encounter: Payer: Self-pay | Admitting: Family Medicine

## 2019-03-30 DIAGNOSIS — K5792 Diverticulitis of intestine, part unspecified, without perforation or abscess without bleeding: Secondary | ICD-10-CM

## 2019-03-30 MED ORDER — METRONIDAZOLE 500 MG PO TABS: 500.0000 mg | ORAL_TABLET | Freq: Two times a day (BID) | ORAL | 0 refills | Status: DC

## 2019-03-30 MED ORDER — CIPROFLOXACIN HCL 500 MG PO TABS: 500.0000 mg | ORAL_TABLET | Freq: Two times a day (BID) | ORAL | 0 refills | Status: DC

## 2019-04-24 DIAGNOSIS — Z03818 Encounter for observation for suspected exposure to other biological agents ruled out: Secondary | ICD-10-CM | POA: Diagnosis not present

## 2019-05-26 ENCOUNTER — Telehealth: Payer: Self-pay | Admitting: Family Medicine

## 2019-05-26 MED ORDER — CIPROFLOXACIN HCL 500 MG PO TABS: 500.0000 mg | ORAL_TABLET | Freq: Two times a day (BID) | ORAL | 0 refills | Status: DC

## 2019-05-26 MED ORDER — METRONIDAZOLE 500 MG PO TABS: 500.0000 mg | ORAL_TABLET | Freq: Two times a day (BID) | ORAL | 0 refills | Status: DC

## 2019-05-26 NOTE — Telephone Encounter (Signed)
Please let the patient know that I sent in Cipro and Flagyl but he needs to go back and discuss this with gastroenterology.

## 2019-05-26 NOTE — Telephone Encounter (Signed)
Pt called stating he is having a medical emergency of passing blood due to his diverticulitis and needs an antibiotic. Please call pt at mobile number.

## 2019-05-26 NOTE — Telephone Encounter (Signed)
Aware. 

## 2019-07-24 DIAGNOSIS — Z6837 Body mass index (BMI) 37.0-37.9, adult: Secondary | ICD-10-CM | POA: Diagnosis not present

## 2019-07-24 DIAGNOSIS — K5792 Diverticulitis of intestine, part unspecified, without perforation or abscess without bleeding: Secondary | ICD-10-CM | POA: Diagnosis not present

## 2019-08-09 ENCOUNTER — Other Ambulatory Visit: Payer: Self-pay

## 2019-08-10 ENCOUNTER — Encounter: Payer: Self-pay | Admitting: Family Medicine

## 2019-08-10 ENCOUNTER — Ambulatory Visit: Payer: BLUE CROSS/BLUE SHIELD | Admitting: Family Medicine

## 2019-08-10 VITALS — BP 131/84 | HR 66 | Temp 97.7°F | Ht 70.0 in | Wt 256.8 lb

## 2019-08-10 DIAGNOSIS — Z23 Encounter for immunization: Secondary | ICD-10-CM | POA: Diagnosis not present

## 2019-08-10 DIAGNOSIS — R809 Proteinuria, unspecified: Secondary | ICD-10-CM | POA: Diagnosis not present

## 2019-08-10 DIAGNOSIS — I10 Essential (primary) hypertension: Secondary | ICD-10-CM

## 2019-08-10 DIAGNOSIS — E785 Hyperlipidemia, unspecified: Secondary | ICD-10-CM | POA: Diagnosis not present

## 2019-08-10 DIAGNOSIS — I25118 Atherosclerotic heart disease of native coronary artery with other forms of angina pectoris: Secondary | ICD-10-CM

## 2019-08-10 DIAGNOSIS — E1129 Type 2 diabetes mellitus with other diabetic kidney complication: Secondary | ICD-10-CM | POA: Diagnosis not present

## 2019-08-10 DIAGNOSIS — F411 Generalized anxiety disorder: Secondary | ICD-10-CM | POA: Diagnosis not present

## 2019-08-10 LAB — BAYER DCA HB A1C WAIVED: HB A1C (BAYER DCA - WAIVED): 6.4 % (ref <7.0)

## 2019-08-10 MED ORDER — LISINOPRIL 10 MG PO TABS: 10.0000 mg | ORAL_TABLET | Freq: Every day | ORAL | 3 refills | Status: DC

## 2019-08-10 MED ORDER — ALPRAZOLAM 1 MG PO TABS: 1.0000 mg | ORAL_TABLET | Freq: Three times a day (TID) | ORAL | 2 refills | Status: DC | PRN

## 2019-08-10 MED ORDER — METFORMIN HCL 500 MG PO TABS: 500.0000 mg | ORAL_TABLET | Freq: Two times a day (BID) | ORAL | 3 refills | Status: DC

## 2019-08-10 MED ORDER — METOPROLOL TARTRATE 25 MG PO TABS: 25.0000 mg | ORAL_TABLET | Freq: Two times a day (BID) | ORAL | 3 refills | Status: DC

## 2019-08-10 MED ORDER — ROSUVASTATIN CALCIUM 40 MG PO TABS: 40.0000 mg | ORAL_TABLET | Freq: Every day | ORAL | 3 refills | Status: DC

## 2019-08-10 NOTE — Progress Notes (Signed)
BP 131/84   Pulse 66   Temp 97.7 F (36.5 C) (Temporal)   Ht 5\' 10"  (1.778 m)   Wt 256 lb 12.8 oz (116.5 kg)   SpO2 94%   BMI 36.85 kg/m    Subjective:   Patient ID: Francisco Allen, male    DOB: 03/30/1961, 58 y.o.   MRN: [id number]  HPI: Francisco Allen is a 58 y.o. male presenting on 08/10/2019 for Diabetes (6 month follow up)   HPI Type 2 diabetes mellitus Patient comes in today for recheck of his diabetes. Patient has been currently taking metformin. Patient is currently on an ACE inhibitor/ARB. Patient has not seen an ophthalmologist this year. Patient denies any issues with their feet.   Anxiety Patient is coming in for recheck for anxiety.  He is currently taking alprazolam and feels like it is managing okay.  Patient denies any suicidal ideations or major thoughts of hurting himself.  Patient feels like if things are going well currently although he has had some losses in the family. Depression screen [facility] 2/9 08/10/2019 03/17/2019 01/23/2019 11/30/2018 08/25/2018  Decreased Interest 0 0 0 0 0  Down, Depressed, Hopeless 0 0 1 0 0  PHQ - 2 Score 0 0 1 0 0  Altered sleeping - - - - -  Tired, decreased energy - - - - -  Change in appetite - - - - -  Feeling bad or failure about yourself  - - - - -  Trouble concentrating - - - - -  Moving slowly or fidgety/restless - - - - -  Suicidal thoughts - - - - -  PHQ-9 Score - - - - -  Difficult doing work/chores - - - - -  Some recent data might be hidden  Current rx-alprazolam 1 mg 3 times daily # meds rx-90 Effectiveness of current meds-working well Adverse reactions form meds-none Pill count performed-No Last drug screen -N/A, will do today ( high risk q82m, moderate risk q29m, low risk yearly ) Urine drug screen today- Yes Was the [place] reviewed-yes  If yes were their any concerning findings? -None  No flowsheet data found.   Pain contract signed on: 08/10/2019  Hypertension Patient is currently on lisinopril and  metoprolol, and their blood pressure today is 131/84. Patient denies any lightheadedness or dizziness. Patient denies headaches, blurred vision, chest pains, shortness of breath, or weakness. Denies any side effects from medication and is content with current medication.   Hyperlipidemia Patient is coming in for recheck of his hyperlipidemia. The patient is currently taking Crestor. They deny any issues with myalgias or history of liver damage from it. They deny any focal numbness or weakness or chest pain.   Relevant past medical, surgical, family and social history reviewed and updated as indicated. Interim medical history since our last visit reviewed. Allergies and medications reviewed and updated.  Review of Systems  Constitutional: Negative for chills and fever.  Respiratory: Negative for shortness of breath and wheezing.   Cardiovascular: Negative for chest pain and leg swelling.  Musculoskeletal: Negative for back pain and gait problem.  Skin: Negative for rash.  Neurological: Negative for dizziness, weakness, light-headedness and numbness.  Psychiatric/Behavioral: Positive for decreased concentration. Negative for self-injury, sleep disturbance and suicidal ideas. The patient is nervous/anxious.   All other systems reviewed and are negative.   Per HPI unless specifically indicated above   Allergies as of 08/10/2019      Reactions   Bee Venom Anaphylaxis, Swelling,  Other (See Comments)   Anaphylactic shock   Penicillins Anaphylaxis, Swelling   Airway closes up Has patient had a PCN reaction causing immediate rash, facial/tongue/throat swelling, SOB or lightheadedness with hypotension: Yes Has patient had a PCN reaction causing severe rash involving mucus membranes or skin necrosis: Yes Has patient had a PCN reaction that required hospitalization: Francisco Allen Has patient had a PCN reaction occurring within the last 10 years: No If all of the above answers are "NO", then may proceed with  Cephalosporin use.      Medication List       Accurate as of August 10, 2019  1:51 PM. If you have any questions, ask your nurse or doctor.        STOP taking these medications   ciprofloxacin 500 MG tablet Commonly known as: Cipro Stopped by: Fransisca Kaufmann Jordy Hewins, MD   metroNIDAZOLE 500 MG tablet Commonly known as: Flagyl Stopped by: Fransisca Kaufmann Emylee Decelle, MD     TAKE these medications   ALPRAZolam 1 MG tablet Commonly known as: XANAX Take 1 tablet (1 mg total) by mouth 3 (three) times daily as needed for anxiety.   clobetasol 0.05 % external solution Commonly known as: TEMOVATE Apply 1 application topically 2 (two) times daily.   lisinopril 10 MG tablet Commonly known as: ZESTRIL Take 1 tablet (10 mg total) by mouth daily.   MENS 50+ MULTI VITAMIN/MIN PO Take by mouth daily.   metFORMIN 500 MG tablet Commonly known as: GLUCOPHAGE Take 1 tablet (500 mg total) by mouth 2 (two) times daily with a meal.   metoprolol tartrate 25 MG tablet Commonly known as: LOPRESSOR Take 1 tablet (25 mg total) by mouth 2 (two) times daily.   rosuvastatin 40 MG tablet Commonly known as: Crestor Take 1 tablet (40 mg total) by mouth daily.   VITAMIN D PO Take 1 capsule daily by mouth.        Objective:   BP 131/84   Pulse 66   Temp 97.7 F (36.5 C) (Temporal)   Ht 5\' 10"  (1.778 m)   Wt 256 lb 12.8 oz (116.5 kg)   SpO2 94%   BMI 36.85 kg/m   Wt Readings from Last 3 Encounters:  08/10/19 256 lb 12.8 oz (116.5 kg)  03/17/19 266 lb 12.8 oz (121 kg)  01/23/19 255 lb 9.6 oz (115.9 kg)    Physical Exam Vitals signs and nursing note reviewed.  Constitutional:      General: He is not in acute distress.    Appearance: He is well-developed. He is not diaphoretic.  Eyes:     General: No scleral icterus.    Conjunctiva/sclera: Conjunctivae normal.  Neck:     Thyroid: No thyromegaly.  Cardiovascular:     Rate and Rhythm: Normal rate and regular rhythm.     Heart sounds:  Normal heart sounds. No murmur.  Pulmonary:     Effort: Pulmonary effort is normal. No respiratory distress.     Breath sounds: Normal breath sounds. No wheezing.  Skin:    General: Skin is warm and dry.     Findings: No rash.  Neurological:     Mental Status: He is alert and oriented to person, place, and time.     Coordination: Coordination normal.  Psychiatric:        Mood and Affect: Mood is anxious and depressed.        Behavior: Behavior normal.        Thought Content: Thought content does not include  suicidal ideation. Thought content does not include suicidal plan.     Assessment & Plan:   Problem List Items Addressed This Visit      Cardiovascular and Mediastinum   HTN (hypertension)   Relevant Medications   metoprolol tartrate (LOPRESSOR) 25 MG tablet   rosuvastatin (CRESTOR) 40 MG tablet   lisinopril (ZESTRIL) 10 MG tablet     Endocrine   Controlled type 2 diabetes mellitus with microalbuminuria (H[facility]) - Primary   Relevant Medications   rosuvastatin (CRESTOR) 40 MG tablet   lisinopril (ZESTRIL) 10 MG tablet   metFORMIN (GLUCOPHAGE) 500 MG tablet   Other Relevant Orders   Bayer DCA Hb A1c Waived (Completed)   Microalbumin / creatinine urine ratio (Completed)     Other   GAD (generalized anxiety disorder)   Relevant Medications   ALPRAZolam (XANAX) 1 MG tablet   Other Relevant Orders   ToxASSURE Select 13 (MW), Urine (Completed)   Hyperlipidemia   Relevant Medications   metoprolol tartrate (LOPRESSOR) 25 MG tablet   rosuvastatin (CRESTOR) 40 MG tablet   lisinopril (ZESTRIL) 10 MG tablet    Other Visit Diagnoses    Coronary artery disease of native artery of native heart with stable angina pectoris (HCC)       Relevant Medications   metoprolol tartrate (LOPRESSOR) 25 MG tablet   rosuvastatin (CRESTOR) 40 MG tablet   lisinopril (ZESTRIL) 10 MG tablet   Need for immunization against influenza       Relevant Orders   Flu Vaccine QUAD 36+ mos IM  (Completed)      Continue metoprolol and Crestor and lisinopril, continue alprazolam and metformin  Follow up plan: Return in about 3 months (around 11/10/2019), or if symptoms worsen or fail to improve, for Diabetes and anxiety recheck.  Counseling provided for all of the vaccine components Orders Placed This Encounter  Procedures  . Bayer [facility] Hb A1c [place], MD [facility] 08/10/2019, 1:51 PM

## 2019-08-11 LAB — MICROALBUMIN / CREATININE URINE RATIO
Creatinine, Urine: 80.5 mg/dL
Microalb/Creat Ratio: 194 mg/g creat — ABNORMAL HIGH (ref 0–29)
Microalbumin, Urine: 156.4 ug/mL

## 2019-08-14 LAB — TOXASSURE SELECT 13 (MW), URINE

## 2019-09-14 ENCOUNTER — Encounter: Payer: Self-pay | Admitting: Family Medicine

## 2019-09-14 ENCOUNTER — Telehealth: Payer: Self-pay | Admitting: Family Medicine

## 2019-09-14 MED ORDER — METRONIDAZOLE 500 MG PO TABS: 500.0000 mg | ORAL_TABLET | Freq: Two times a day (BID) | ORAL | 0 refills | Status: DC

## 2019-09-14 MED ORDER — CIPROFLOXACIN HCL 500 MG PO TABS: 500.0000 mg | ORAL_TABLET | Freq: Two times a day (BID) | ORAL | 0 refills | Status: DC

## 2019-09-14 NOTE — Telephone Encounter (Signed)
Patient was rude and hung up phone on nurse when inquiring about condition.  His wife says provider is aware of patient's condition and will address.

## 2019-09-21 ENCOUNTER — Ambulatory Visit: Payer: BLUE CROSS/BLUE SHIELD | Admitting: Family Medicine

## 2019-10-02 ENCOUNTER — Other Ambulatory Visit: Payer: Self-pay

## 2019-10-02 ENCOUNTER — Encounter: Payer: Self-pay | Admitting: Family Medicine

## 2019-10-02 ENCOUNTER — Telehealth (INDEPENDENT_AMBULATORY_CARE_PROVIDER_SITE_OTHER): Payer: BC Managed Care – PPO | Admitting: Family Medicine

## 2019-10-02 ENCOUNTER — Ambulatory Visit: Payer: BC Managed Care – PPO | Admitting: Family Medicine

## 2019-10-02 DIAGNOSIS — K649 Unspecified hemorrhoids: Secondary | ICD-10-CM

## 2019-10-02 MED ORDER — PHENYLEPHRINE IN HARD FAT 0.25 % RE SUPP: 1.0000 | Freq: Two times a day (BID) | RECTAL | 1 refills | Status: DC

## 2019-10-02 NOTE — Telephone Encounter (Signed)
Virtual Visit via telephone Note  I connected with Francisco Allen on 10/02/19  at 1640 by telephone and verified that I am speaking with the correct person using two identifiers. Francisco Allen is currently located at home and no other people are currently with her during visit. The provider, Fransisca Kaufmann , MD is located in their office at time of visit.  Call ended at 1649  I discussed the limitations, risks, security and privacy concerns of performing an evaluation and management service by telephone and the availability of in person appointments. I also discussed with the patient that there may be a patient responsible charge related to this service. The patient expressed understanding and agreed to proceed.   History and Present Illness: Patient is having diarrhea and blood in stool.  He had hemorrhoids cut on before but is still having these.  The bleeding started just this morning and the hemorrhoids are bulging out. He was constipated this morning and had a hard bowel movement when this started.   No diagnosis found.  Outpatient Encounter Medications as of 10/02/2019  Medication Sig  . ALPRAZolam (XANAX) 1 MG tablet Take 1 tablet (1 mg total) by mouth 3 (three) times daily as needed for anxiety.  . Cholecalciferol (VITAMIN D PO) Take 1 capsule daily by mouth.   . ciprofloxacin (CIPRO) 500 MG tablet Take 1 tablet (500 mg total) by mouth 2 (two) times daily.  . clobetasol (TEMOVATE) 0.05 % external solution Apply 1 application topically 2 (two) times daily.  Marland Kitchen lisinopril (ZESTRIL) 10 MG tablet Take 1 tablet (10 mg total) by mouth daily.  . metFORMIN (GLUCOPHAGE) 500 MG tablet Take 1 tablet (500 mg total) by mouth 2 (two) times daily with a meal.  . metoprolol tartrate (LOPRESSOR) 25 MG tablet Take 1 tablet (25 mg total) by mouth 2 (two) times daily.  . metroNIDAZOLE (FLAGYL) 500 MG tablet Take 1 tablet (500 mg total) by mouth 2 (two) times daily.  . Multiple Vitamins-Minerals  (MENS 50+ MULTI VITAMIN/MIN PO) Take by mouth daily.  . rosuvastatin (CRESTOR) 40 MG tablet Take 1 tablet (40 mg total) by mouth daily.   No facility-administered encounter medications on file as of 10/02/2019.     Review of Systems - General ROS: negative for - chills or fever Gastrointestinal ROS: positive for - blood in stools, constipation and diarrhea negative for - abdominal pain, appetite loss, melena or nausea/vomiting   Observations/Objective: Patient is comfortable and in no acute distress  Assessment and Plan: Problem List Items Addressed This Visit    None    Visit Diagnoses    Hemorrhoids, unspecified hemorrhoid type    -  Primary   Relevant Medications   phenylephrine (,USE FOR PREPARATION-H,) 0.25 % suppository       Follow Up Instructions: Follow up as needed    I discussed the assessment and treatment plan with the patient. The patient was provided an opportunity to ask questions and all were answered. The patient agreed with the plan and demonstrated an understanding of the instructions.   The patient was advised to call back or seek an in-person evaluation if the symptoms worsen or if the condition fails to improve as anticipated.  The above assessment and management plan was discussed with the patient. The patient verbalized understanding of and has agreed to the management plan. Patient is aware to call the clinic if symptoms persist or worsen. Patient is aware when to return to the clinic for a follow-up  visit. Patient educated on when it is appropriate to go to the emergency department.    I provided 9 minutes of non-face-to-face time during this encounter.    [name], MD

## 2019-10-27 ENCOUNTER — Telehealth: Payer: Self-pay | Admitting: Family Medicine

## 2019-10-27 MED ORDER — CIPROFLOXACIN HCL 500 MG PO TABS: 500.0000 mg | ORAL_TABLET | Freq: Two times a day (BID) | ORAL | 0 refills | Status: DC

## 2019-10-27 MED ORDER — METRONIDAZOLE 500 MG PO TABS: 500.0000 mg | ORAL_TABLET | Freq: Two times a day (BID) | ORAL | 0 refills | Status: DC

## 2019-10-27 NOTE — Telephone Encounter (Signed)
I sent in antibiotics for him.

## 2019-10-27 NOTE — Telephone Encounter (Signed)
Patient is bleeding again from his diverticulitis. Patient wants med sent to [facility]

## 2019-10-30 NOTE — Telephone Encounter (Signed)
Wife aware and verbalizes understanding.  

## 2019-11-29 ENCOUNTER — Ambulatory Visit: Payer: BC Managed Care – PPO | Admitting: Family Medicine

## 2019-11-29 ENCOUNTER — Encounter: Payer: Self-pay | Admitting: Family Medicine

## 2019-11-29 VITALS — BP 128/77 | HR 77 | Temp 99.1°F | Ht 70.0 in | Wt 262.6 lb

## 2019-11-29 DIAGNOSIS — R1032 Left lower quadrant pain: Secondary | ICD-10-CM

## 2019-11-29 DIAGNOSIS — K921 Melena: Secondary | ICD-10-CM

## 2019-11-29 MED ORDER — CIPROFLOXACIN HCL 500 MG PO TABS: 500.0000 mg | ORAL_TABLET | Freq: Two times a day (BID) | ORAL | 0 refills | Status: DC

## 2019-11-29 MED ORDER — METRONIDAZOLE 500 MG PO TABS: 500.0000 mg | ORAL_TABLET | Freq: Two times a day (BID) | ORAL | 0 refills | Status: DC

## 2019-11-29 NOTE — Progress Notes (Signed)
BP 128/77   Pulse 77   Temp 99.1 F (37.3 C) (Temporal)   Ht 5\' 10"  (1.778 m)   Wt 262 lb 9.6 oz (119.1 kg)   SpO2 95%   BMI 37.68 kg/m    Subjective:   Patient ID: Francisco Allen, male    DOB: Nov 18, 1960, 59 y.o.   MRN: [id number]  HPI: Francisco Allen is a 59 y.o. male presenting on 11/29/2019 for Diverticulitis (LLQ pain and blood in stool started this morning)   HPI Patient is coming in for left lower quadrant abdominal pain and rectal bleeding and diarrhea that is been going on over since this morning this time.  He has had this recurrently about 4 or 5 times over the past 6 or 7 months.  He was doing better for a while but it has been more recurrent.  He says stomach is very sharp and intense on the pain.  He denies any fevers or chills or pain anywhere else.  Relevant past medical, surgical, family and social history reviewed and updated as indicated. Interim medical history since our last visit reviewed. Allergies and medications reviewed and updated.  Review of Systems  Constitutional: Negative for chills and fever.  Respiratory: Negative for shortness of breath and wheezing.   Cardiovascular: Negative for chest pain and leg swelling.  Gastrointestinal: Positive for abdominal pain, blood in stool and diarrhea. Negative for constipation, nausea and vomiting.  Musculoskeletal: Negative for back pain and gait problem.  Skin: Negative for rash.  All other systems reviewed and are negative.   Per HPI unless specifically indicated above   Allergies as of 11/29/2019      Reactions   Bee Venom Anaphylaxis, Swelling, Other (See Comments)   Anaphylactic shock   Penicillins Anaphylaxis, Swelling   Airway closes up Has patient had a PCN reaction causing immediate rash, facial/tongue/throat swelling, SOB or lightheadedness with hypotension: Yes Has patient had a PCN reaction causing severe rash involving mucus membranes or skin necrosis: Yes Has patient had a PCN reaction  that required hospitalization: [name] Has patient had a PCN reaction occurring within the last 10 years: No If all of the above answers are "NO", then may proceed with Cephalosporin use.      Medication List       Accurate as of November 29, 2019  2:51 PM. If you have any questions, ask your nurse or doctor.        ALPRAZolam 1 MG tablet Commonly known as: XANAX Take 1 tablet (1 mg total) by mouth 3 (three) times daily as needed for anxiety.   ciprofloxacin 500 MG tablet Commonly known as: Cipro Take 1 tablet (500 mg total) by mouth 2 (two) times daily.   clobetasol 0.05 % external solution Commonly known as: TEMOVATE Apply 1 application topically 2 (two) times daily.   lisinopril 10 MG tablet Commonly known as: ZESTRIL Take 1 tablet (10 mg total) by mouth daily.   MENS 50+ MULTI VITAMIN/MIN PO Take by mouth daily.   metFORMIN 500 MG tablet Commonly known as: GLUCOPHAGE Take 1 tablet (500 mg total) by mouth 2 (two) times daily with a meal.   metoprolol tartrate 25 MG tablet Commonly known as: LOPRESSOR Take 1 tablet (25 mg total) by mouth 2 (two) times daily.   metroNIDAZOLE 500 MG tablet Commonly known as: FLAGYL Take 1 tablet (500 mg total) by mouth 2 (two) times daily.   phenylephrine 0.25 % suppository Commonly known as: (USE for PREPARATION-H) Place 1  suppository rectally 2 (two) times daily.   rosuvastatin 40 MG tablet Commonly known as: Crestor Take 1 tablet (40 mg total) by mouth daily.   VITAMIN D PO Take 1 capsule daily by mouth.        Objective:   BP 128/77   Pulse 77   Temp 99.1 F (37.3 C) (Temporal)   Ht 5\' 10"  (1.778 m)   Wt 262 lb 9.6 oz (119.1 kg)   SpO2 95%   BMI 37.68 kg/m   Wt Readings from Last 3 Encounters:  11/29/19 262 lb 9.6 oz (119.1 kg)  08/10/19 256 lb 12.8 oz (116.5 kg)  03/17/19 266 lb 12.8 oz (121 kg)    Physical Exam Vitals and nursing note reviewed.  Constitutional:      General: He is not in acute distress.     Appearance: He is well-developed. He is not diaphoretic.  Eyes:     General: No scleral icterus.    Conjunctiva/sclera: Conjunctivae normal.  Neck:     Thyroid: No thyromegaly.  Cardiovascular:     Rate and Rhythm: Normal rate and regular rhythm.     Heart sounds: Normal heart sounds. No murmur.  Pulmonary:     Effort: Pulmonary effort is normal. No respiratory distress.     Breath sounds: Normal breath sounds. No wheezing.  Abdominal:     General: Abdomen is flat. Bowel sounds are normal. There is no distension.     Tenderness: There is abdominal tenderness in the left lower quadrant. There is no right CVA tenderness, left CVA tenderness, guarding or rebound.  Musculoskeletal:        General: Normal range of motion.     Cervical back: Neck supple.  Lymphadenopathy:     Cervical: No cervical adenopathy.  Skin:    General: Skin is warm and dry.     Findings: No rash.  Neurological:     Mental Status: He is alert and oriented to person, place, and time.     Coordination: Coordination normal.  Psychiatric:        Behavior: Behavior normal.       Assessment & Plan:   Problem List Items Addressed This Visit      Other   Hematochezia   Relevant Medications   metroNIDAZOLE (FLAGYL) 500 MG tablet   ciprofloxacin (CIPRO) 500 MG tablet   Other Relevant Orders   CBC with Differential/Platelet   Cdiff NAA+O+P+Stool Culture    Other Visit Diagnoses    Left lower quadrant abdominal pain    -  Primary   Relevant Medications   metroNIDAZOLE (FLAGYL) 500 MG tablet   ciprofloxacin (CIPRO) 500 MG tablet   Other Relevant Orders   CBC with Differential/Platelet   Cdiff NAA+O+P+Stool Culture      Patient has had this recurrently, recommended CT abdomen pelvis and stool study but patient is going to call and see if his insurance and what his cost would be because of Covid they have been financially having troubles through his work  We will go ahead and treat like diverticulitis as  he has had it recurrently but instructed not to start the antibiotic until he brings a stool study Follow up plan: Return if symptoms worsen or fail to improve.  Counseling provided for all of the vaccine components Orders Placed This Encounter  Procedures  . Cdiff NAA+O+P+Stool Culture  . CBC with Differential/Platelet    Caryl Pina, MD [facility] 11/29/2019, 2:51 PM

## 2019-11-30 ENCOUNTER — Other Ambulatory Visit: Payer: BC Managed Care – PPO

## 2019-11-30 ENCOUNTER — Other Ambulatory Visit: Payer: Self-pay

## 2019-11-30 DIAGNOSIS — R1032 Left lower quadrant pain: Secondary | ICD-10-CM | POA: Diagnosis not present

## 2019-11-30 DIAGNOSIS — K921 Melena: Secondary | ICD-10-CM | POA: Diagnosis not present

## 2019-11-30 LAB — CBC WITH DIFFERENTIAL/PLATELET
Basophils Absolute: 0.1 10*3/uL (ref 0.0–0.2)
Basos: 1 %
EOS (ABSOLUTE): 0.2 10*3/uL (ref 0.0–0.4)
Eos: 3 %
Hematocrit: 51.7 % — ABNORMAL HIGH (ref 37.5–51.0)
Hemoglobin: 17.5 g/dL (ref 13.0–17.7)
Immature Grans (Abs): 0 10*3/uL (ref 0.0–0.1)
Immature Granulocytes: 1 %
Lymphocytes Absolute: 2.5 10*3/uL (ref 0.7–3.1)
Lymphs: 31 %
MCH: 30.8 pg (ref 26.6–33.0)
MCHC: 33.8 g/dL (ref 31.5–35.7)
MCV: 91 fL (ref 79–97)
Monocytes Absolute: 0.5 10*3/uL (ref 0.1–0.9)
Monocytes: 7 %
Neutrophils Absolute: 4.6 10*3/uL (ref 1.4–7.0)
Neutrophils: 57 %
Platelets: 141 10*3/uL — ABNORMAL LOW (ref 150–450)
RBC: 5.69 x10E6/uL (ref 4.14–5.80)
RDW: 12.6 % (ref 11.6–15.4)
WBC: 8 10*3/uL (ref 3.4–10.8)

## 2019-12-04 LAB — CDIFF NAA+O+P+STOOL CULTURE
E coli, Shiga toxin Assay: NEGATIVE
Toxigenic C. Difficile by PCR: NEGATIVE

## 2019-12-05 ENCOUNTER — Telehealth: Payer: Self-pay | Admitting: Family Medicine

## 2019-12-05 NOTE — Telephone Encounter (Signed)
Pt called back to go over lab results. Went over Dr Neldon Mc notes from most recent labs. Pt said he wanted to work on dieting and losing some weight before considering CT scan. Pt said if he had another flare up he would call to let us know.

## 2019-12-06 NOTE — Telephone Encounter (Signed)
Okay sounds good, we will watch for further symptoms

## 2019-12-06 NOTE — Telephone Encounter (Signed)
Wife aware and verbalizes understanding per dpr.  °

## 2020-01-04 ENCOUNTER — Telehealth: Payer: Self-pay | Admitting: Family Medicine

## 2020-01-04 ENCOUNTER — Encounter: Payer: Self-pay | Admitting: Family Medicine

## 2020-01-04 NOTE — Telephone Encounter (Signed)
Note already done,

## 2020-01-05 NOTE — Telephone Encounter (Signed)
Patients wife aware

## 2020-01-10 ENCOUNTER — Encounter: Payer: Self-pay | Admitting: *Deleted

## 2020-01-23 ENCOUNTER — Ambulatory Visit: Payer: BC Managed Care – PPO | Admitting: Nurse Practitioner

## 2020-01-23 ENCOUNTER — Encounter: Payer: Self-pay | Admitting: Nurse Practitioner

## 2020-01-23 ENCOUNTER — Other Ambulatory Visit: Payer: Self-pay

## 2020-01-23 VITALS — BP 120/75 | HR 63 | Temp 97.8°F | Resp 20 | Ht 70.0 in | Wt 258.0 lb

## 2020-01-23 DIAGNOSIS — M25551 Pain in right hip: Secondary | ICD-10-CM | POA: Diagnosis not present

## 2020-01-23 MED ORDER — METHYLPREDNISOLONE ACETATE 80 MG/ML IJ SUSP
80.0000 mg | Freq: Once | INTRAMUSCULAR | Status: AC
  Administered 2020-01-23: 80 mg via INTRAMUSCULAR

## 2020-01-23 NOTE — Progress Notes (Signed)
   Subjective:    Patient ID: Francisco Allen, male    DOB: 1961-06-11, 59 y.o.   MRN: [id number]   Chief Complaint: Hip Pain (right)   HPI Pt comes in with right hip pain that started yesterday. He was walking and he made a turn and started having pain. Says feet and knees hurt every day after working and thinks he has arthritis and the pain could be due to this. Pain today is constant. Pain increases with walking or putting any pressure on that side. Pain is sharp and radiates down to his knee and halfway up his back with twisting motion. Took a xanax last night before bed and then took an 800 mg ibuprofen about 45 minutes later that did help relieve his pain for a while. Rates pain 4/10 right now and is more of a dull ache while sitting. Increased pain with lifting leg.      Review of Systems  Constitutional: Negative.   HENT: Negative.   Eyes: Negative.   Respiratory: Negative.   Cardiovascular: Negative.   Gastrointestinal: Negative.   Endocrine: Negative.   Genitourinary: Negative.   Skin: Negative.   Allergic/Immunologic: Negative.   Neurological: Negative.   Hematological: Negative.   Psychiatric/Behavioral: Negative.        Objective:   Physical Exam Vitals and nursing note reviewed.  Constitutional:      Appearance: Normal appearance.  Cardiovascular:     Rate and Rhythm: Normal rate and regular rhythm.     Heart sounds: Normal heart sounds.  Pulmonary:     Breath sounds: Normal breath sounds.  Musculoskeletal:     Comments: Gait limping on right. (+) SLR on right at 45 degrees Motor strength and sensation distally intact  Skin:    General: Skin is warm.  Neurological:     General: No focal deficit present.     Mental Status: He is alert and oriented to person, place, and time.    BP 120/75   Pulse 63   Temp 97.8 F (36.6 C) (Temporal)   Resp 20   Ht 5\' 10"  (1.778 m)   Wt 258 lb (117 kg)   SpO2 94%   BMI 37.02 kg/m        Assessment & Plan:    Francisco Allen in today with chief complaint of Hip Pain (right)   1. Hip pain, right moist heat Rest No bending or stooping Blood sugars may be high for several days- so watch carbs in diet - methylPREDNISolone acetate (DEPO-MEDROL) injection 80 mg    The above assessment and management plan was discussed with the patient. The patient verbalized understanding of and has agreed to the management plan. Patient is aware to call the clinic if symptoms persist or worsen. Patient is aware when to return to the clinic for a follow-up visit. Patient educated on when it is appropriate to go to the emergency department.   Mary-Margaret Hassell Done, FNP

## 2020-01-23 NOTE — Patient Instructions (Signed)
Hip Pain The hip is the joint between the upper legs and the lower pelvis. The bones, cartilage, tendons, and muscles of your hip joint support your body and allow you to move around. Hip pain can range from a minor ache to severe pain in one or both of your hips. The pain may be felt on the inside of the hip joint near the groin, or on the outside near the buttocks and upper thigh. You may also have swelling or stiffness in your hip area. Follow these instructions at home: Managing pain, stiffness, and swelling      If directed, put ice on the painful area. To do this: ? Put ice in a plastic bag. ? Place a towel between your skin and the bag. ? Leave the ice on for 20 minutes, 2-3 times a day.  If directed, apply heat to the affected area as often as told by your health care provider. Use the heat source that your health care provider recommends, such as a moist heat pack or a heating pad. ? Place a towel between your skin and the heat source. ? Leave the heat on for 20-30 minutes. ? Remove the heat if your skin turns bright red. This is especially important if you are unable to feel pain, heat, or cold. You may have a greater risk of getting burned. Activity  Do exercises as told by your health care provider.  Avoid activities that cause pain. General instructions   Take over-the-counter and prescription medicines only as told by your health care provider.  Keep a journal of your symptoms. Write down: ? How often you have hip pain. ? The location of your pain. ? What the pain feels like. ? What makes the pain worse.  Sleep with a pillow between your legs on your most comfortable side.  Keep all follow-up visits as told by your health care provider. This is important. Contact a health care provider if:  You cannot put weight on your leg.  Your pain or swelling continues or gets worse after one week.  It gets harder to walk.  You have a fever. Get help right away  if:  You fall.  You have a sudden increase in pain and swelling in your hip.  Your hip is red or swollen or very tender to touch. Summary  Hip pain can range from a minor ache to severe pain in one or both of your hips.  The pain may be felt on the inside of the hip joint near the groin, or on the outside near the buttocks and upper thigh.  Avoid activities that cause pain.  Write down how often you have hip pain, the location of the pain, what makes it worse, and what it feels like. This information is not intended to replace advice given to you by your health care provider. Make sure you discuss any questions you have with your health care provider. Document Revised: 03/13/2019 Document Reviewed: 03/13/2019 Elsevier Patient Education  2020 Elsevier Inc. -- 

## 2020-02-08 ENCOUNTER — Ambulatory Visit: Payer: Self-pay | Attending: Internal Medicine

## 2020-02-08 DIAGNOSIS — Z23 Encounter for immunization: Secondary | ICD-10-CM

## 2020-02-08 NOTE — Progress Notes (Signed)
   Covid-19 Vaccination Clinic  Name:  MOHID SYDOW    MRN: [id number] DOB: Jan 27, 1961  02/08/2020  Mr. Clemmons was observed post Covid-19 immunization for 30 minutes based on pre-vaccination screening without incident. He was provided with Vaccine Information Sheet and instruction to access the V-Safe system.   Mr. Monjaraz was instructed to call 911 with any severe reactions post vaccine: Marland Kitchen Difficulty breathing  . Swelling of face and throat  . A fast heartbeat  . A bad rash all over body  . Dizziness and weakness   Immunizations Administered    Name Date Dose VIS Date Route   Moderna COVID-19 Vaccine 02/08/2020  1:40 PM 0.5 mL 10/10/2019 Intramuscular   Manufacturer: Moderna   Lot: [id number]   [id number]

## 2020-02-14 ENCOUNTER — Encounter: Payer: Self-pay | Admitting: Family Medicine

## 2020-02-14 ENCOUNTER — Telehealth: Payer: Self-pay | Admitting: Family Medicine

## 2020-02-14 DIAGNOSIS — K5792 Diverticulitis of intestine, part unspecified, without perforation or abscess without bleeding: Secondary | ICD-10-CM | POA: Diagnosis not present

## 2020-02-14 DIAGNOSIS — Z6837 Body mass index (BMI) 37.0-37.9, adult: Secondary | ICD-10-CM | POA: Diagnosis not present

## 2020-02-14 NOTE — Telephone Encounter (Signed)
Wife says patient went to urgent care.

## 2020-02-14 NOTE — Telephone Encounter (Signed)
Pt called office to schedule an appt. Wanted to be seen today. Explained to pt that we didn't have anything open for today or the rest of the week but that I could schedule him for Monday. Pt said if we couldn't see him today then he was going to just go to Urgent Care.

## 2020-02-16 ENCOUNTER — Ambulatory Visit: Payer: BC Managed Care – PPO | Admitting: Family Medicine

## 2020-02-23 ENCOUNTER — Ambulatory Visit (INDEPENDENT_AMBULATORY_CARE_PROVIDER_SITE_OTHER): Payer: BC Managed Care – PPO | Admitting: Family Medicine

## 2020-02-23 ENCOUNTER — Encounter: Payer: Self-pay | Admitting: Family Medicine

## 2020-02-23 ENCOUNTER — Other Ambulatory Visit: Payer: Self-pay

## 2020-02-23 VITALS — BP 145/79 | HR 53 | Temp 97.4°F | Ht 70.0 in | Wt 253.0 lb

## 2020-02-23 DIAGNOSIS — E782 Mixed hyperlipidemia: Secondary | ICD-10-CM | POA: Diagnosis not present

## 2020-02-23 DIAGNOSIS — R809 Proteinuria, unspecified: Secondary | ICD-10-CM | POA: Diagnosis not present

## 2020-02-23 DIAGNOSIS — I10 Essential (primary) hypertension: Secondary | ICD-10-CM

## 2020-02-23 DIAGNOSIS — E1129 Type 2 diabetes mellitus with other diabetic kidney complication: Secondary | ICD-10-CM

## 2020-02-23 DIAGNOSIS — F411 Generalized anxiety disorder: Secondary | ICD-10-CM

## 2020-02-23 LAB — BAYER DCA HB A1C WAIVED: HB A1C (BAYER DCA - WAIVED): 6.8 % (ref <7.0)

## 2020-02-23 MED ORDER — ALPRAZOLAM 1 MG PO TABS: 1.0000 mg | ORAL_TABLET | Freq: Three times a day (TID) | ORAL | 2 refills | Status: DC | PRN

## 2020-02-23 NOTE — Progress Notes (Signed)
BP (!) 145/79   Pulse (!) 53   Temp (!) 97.4 F (36.3 C) (Temporal)   Ht _0  (1.778 m)   Wt 253 lb (114.8 kg)   BMI 36.30 kg/m    Subjective:   Patient ID: Francisco Allen, male    DOB: 1961-08-24, 59 y.o.   MRN: [id number]  HPI: Francisco Allen is a 59 y.o. male presenting on 02/23/2020 for Medical Management of Chronic Issues   HPI Type 2 diabetes mellitus Patient comes in today for recheck of his diabetes. Patient has been currently taking Metformin and A1c today 6.8. Patient is currently on an ACE inhibitor/ARB. Patient has not seen an ophthalmologist this year. Patient denies any issues with their feet.   Hypertension Patient is currently on lisinopril 10 and metoprolol, and their blood pressure today is 145/79. Patient denies any lightheadedness or dizziness. Patient denies headaches, blurred vision, chest pains, shortness of breath, or weakness. Denies any side effects from medication and is content with current medication.   Hyperlipidemia Patient is coming in for recheck of his hyperlipidemia. The patient is currently taking Crestor. They deny any issues with myalgias or history of liver damage from it. They deny any focal numbness or weakness or chest pain.   Anxiety Current rx-alprazolam 1 mg 3 times daily. # meds rx-90 Effectiveness of current meds-works well, is needing to use it less frequently Adverse reactions form meds-none  Pill count performed-No Last drug screen -10 8 2020 ( high risk q65m moderate risk q650mlow risk yearly ) Urine drug screen today- No [place]  If yes were their any concerning findings? -None  No flowsheet data found.   Controlled substance contract signed on: 10 8 2020  Relevant past medical, surgical, family and social history reviewed and updated as indicated. Interim medical history since our last visit reviewed. Allergies and medications reviewed and updated.  Review of Systems  Constitutional: Negative  for chills and fever.  Respiratory: Negative for shortness of breath and wheezing.   Cardiovascular: Negative for chest pain and leg swelling.  Musculoskeletal: Negative for back pain and gait problem.  Skin: Negative for rash.  Neurological: Negative for weakness and numbness.  All other systems reviewed and are negative.   Per HPI unless specifically indicated above   Allergies as of 02/23/2020      Reactions   Bee Venom Anaphylaxis, Swelling, Other (See Comments)   Anaphylactic shock   Penicillins Anaphylaxis, Swelling   Airway closes up Has patient had a PCN reaction causing immediate rash, facial/tongue/throat swelling, SOB or lightheadedness with hypotension: Yes Has patient had a PCN reaction causing severe rash involving mucus membranes or skin necrosis: Yes Has patient had a PCN reaction that required hospitalization: YeAlfred Levinsas patient had a PCN reaction occurring within the last 10 years: No If all of the above answers are "NO", then may proceed with Cephalosporin use.      Medication List       Accurate as of February 23, 2020  8:43 AM. If you have any questions, ask your nurse or doctor.        ALPRAZolam 1 MG tablet Commonly known as: XANAX Take 1 tablet (1 mg total) by mouth 3 (three) times daily as needed for anxiety.   clobetasol 0.05 % external solution Commonly known as: TEMOVATE Apply 1 application topically 2 (two) times daily.   Fish Oil 1000 MG Caps Take 1,000 mg by mouth in the morning and at bedtime.  lisinopril 10 MG tablet Commonly known as: ZESTRIL Take 1 tablet (10 mg total) by mouth daily.   MENS 50+ MULTI VITAMIN/MIN PO Take by mouth daily.   metFORMIN 500 MG tablet Commonly known as: GLUCOPHAGE Take 1 tablet (500 mg total) by mouth 2 (two) times daily with a meal.   metoprolol tartrate 25 MG tablet Commonly known as: LOPRESSOR Take 1 tablet (25 mg total) by mouth 2 (two) times daily.   rosuvastatin 40 MG tablet Commonly known as:  Crestor Take 1 tablet (40 mg total) by mouth daily.   VITAMIN D PO Take 1 capsule daily by mouth.        Objective:   BP (!) 145/79   Pulse (!) 53   Temp (!) 97.4 F (36.3 C) (Temporal)   Ht _0  (1.778 m)   Wt 253 lb (114.8 kg)   BMI 36.30 kg/m   Wt Readings from Last 3 Encounters:  02/23/20 253 lb (114.8 kg)  01/23/20 258 lb (117 kg)  11/29/19 262 lb 9.6 oz (119.1 kg)    Physical Exam Vitals and nursing note reviewed.  Constitutional:      General: He is not in acute distress.    Appearance: He is well-developed. He is not diaphoretic.  Eyes:     General: No scleral icterus.    Conjunctiva/sclera: Conjunctivae normal.  Neck:     Thyroid: No thyromegaly.  Cardiovascular:     Rate and Rhythm: Normal rate and regular rhythm.     Heart sounds: Normal heart sounds. No murmur.  Pulmonary:     Effort: Pulmonary effort is normal. No respiratory distress.     Breath sounds: Normal breath sounds. No wheezing.  Musculoskeletal:        General: Normal range of motion.     Cervical back: Neck supple.  Lymphadenopathy:     Cervical: No cervical adenopathy.  Skin:    General: Skin is warm and dry.     Findings: No rash.  Neurological:     Mental Status: He is alert and oriented to person, place, and time.     Coordination: Coordination normal.  Psychiatric:        Behavior: Behavior normal.       Assessment & Plan:   Problem List Items Addressed This Visit      Cardiovascular and Mediastinum   HTN (hypertension)     Endocrine   Controlled type 2 diabetes mellitus with microalbuminuria ([place]) - Primary   Relevant Orders   Bayer DCA Hb A1c Waived   CMP14+EGFR     Other   GAD (generalized anxiety disorder)   Relevant Medications   ALPRAZolam (XANAX) 1 MG tablet   Other Relevant Orders   CBC with Differential/Platelet   Hyperlipidemia   Relevant Orders   Lipid panel   Morbid obesity ([place])   Relevant Orders   Lipid panel      Recommend diet and  exercise Follow up plan: Return in about 3 months (around 05/24/2020), or if symptoms worsen or fail to improve, for Hypertension and diabetes.  Counseling provided for all of the vaccine components Orders Placed This Encounter  Procedures  . Bayer DCA Hb A1c Waived  . CBC with Differential/Platelet  . CMP14+EGFR  . Lipid panel    Caryl Pina, MD [facility] 02/23/2020, 8:43 AM

## 2020-02-24 LAB — CBC WITH DIFFERENTIAL/PLATELET
Basophils Absolute: 0.1 10*3/uL (ref 0.0–0.2)
Basos: 1 %
EOS (ABSOLUTE): 0.2 10*3/uL (ref 0.0–0.4)
Eos: 2 %
Hematocrit: 55.4 % — ABNORMAL HIGH (ref 37.5–51.0)
Hemoglobin: 19 g/dL — ABNORMAL HIGH (ref 13.0–17.7)
Immature Grans (Abs): 0.1 10*3/uL (ref 0.0–0.1)
Immature Granulocytes: 1 %
Lymphocytes Absolute: 2.5 10*3/uL (ref 0.7–3.1)
Lymphs: 26 %
MCH: 31.3 pg (ref 26.6–33.0)
MCHC: 34.3 g/dL (ref 31.5–35.7)
MCV: 91 fL (ref 79–97)
Monocytes Absolute: 0.6 10*3/uL (ref 0.1–0.9)
Monocytes: 7 %
Neutrophils Absolute: 6 10*3/uL (ref 1.4–7.0)
Neutrophils: 63 %
Platelets: 163 10*3/uL (ref 150–450)
RBC: 6.07 x10E6/uL — ABNORMAL HIGH (ref 4.14–5.80)
RDW: 12.6 % (ref 11.6–15.4)
WBC: 9.5 10*3/uL (ref 3.4–10.8)

## 2020-02-24 LAB — CMP14+EGFR
ALT: 31 IU/L (ref 0–44)
AST: 28 IU/L (ref 0–40)
Albumin/Globulin Ratio: 2 (ref 1.2–2.2)
Albumin: 4.7 g/dL (ref 3.8–4.8)
Alkaline Phosphatase: 75 IU/L (ref 39–117)
BUN/Creatinine Ratio: 13 (ref 10–24)
BUN: 13 mg/dL (ref 8–27)
Bilirubin Total: 1.2 mg/dL (ref 0.0–1.2)
CO2: 28 mmol/L (ref 20–29)
Calcium: 9.7 mg/dL (ref 8.6–10.2)
Chloride: 99 mmol/L (ref 96–106)
Creatinine, Ser: 1.03 mg/dL (ref 0.76–1.27)
GFR calc Af Amer: 90 mL/min/{1.73_m2} (ref >59)
GFR calc non Af Amer: 78 mL/min/{1.73_m2} (ref >59)
Globulin, Total: 2.4 g/dL (ref 1.5–4.5)
Glucose: 172 mg/dL — ABNORMAL HIGH (ref 65–99)
Potassium: 4.3 mmol/L (ref 3.5–5.2)
Sodium: 139 mmol/L (ref 134–144)
Total Protein: 7.1 g/dL (ref 6.0–8.5)

## 2020-02-24 LAB — LIPID PANEL
Chol/HDL Ratio: 3.8 ratio (ref 0.0–5.0)
Cholesterol, Total: 107 mg/dL (ref 100–199)
HDL: 28 mg/dL — ABNORMAL LOW (ref >39)
LDL Chol Calc (NIH): 46 mg/dL (ref 0–99)
Triglycerides: 205 mg/dL — ABNORMAL HIGH (ref 0–149)
VLDL Cholesterol Cal: 33 mg/dL (ref 5–40)

## 2020-02-28 ENCOUNTER — Encounter: Payer: Self-pay | Admitting: Nurse Practitioner

## 2020-02-28 ENCOUNTER — Telehealth: Payer: BC Managed Care – PPO | Admitting: Family Medicine

## 2020-02-28 ENCOUNTER — Ambulatory Visit (INDEPENDENT_AMBULATORY_CARE_PROVIDER_SITE_OTHER): Payer: BC Managed Care – PPO | Admitting: Nurse Practitioner

## 2020-02-28 ENCOUNTER — Other Ambulatory Visit: Payer: Self-pay

## 2020-02-28 ENCOUNTER — Ambulatory Visit (INDEPENDENT_AMBULATORY_CARE_PROVIDER_SITE_OTHER): Payer: BC Managed Care – PPO

## 2020-02-28 VITALS — BP 137/79 | HR 63 | Temp 98.5°F | Ht 70.0 in | Wt 253.0 lb

## 2020-02-28 DIAGNOSIS — I10 Essential (primary) hypertension: Secondary | ICD-10-CM

## 2020-02-28 DIAGNOSIS — E1129 Type 2 diabetes mellitus with other diabetic kidney complication: Secondary | ICD-10-CM

## 2020-02-28 DIAGNOSIS — M25551 Pain in right hip: Secondary | ICD-10-CM

## 2020-02-28 DIAGNOSIS — M1611 Unilateral primary osteoarthritis, right hip: Secondary | ICD-10-CM | POA: Diagnosis not present

## 2020-02-28 DIAGNOSIS — K921 Melena: Secondary | ICD-10-CM

## 2020-02-28 MED ORDER — METHYLPREDNISOLONE ACETATE 80 MG/ML IJ SUSP
80.0000 mg | Freq: Once | INTRAMUSCULAR | Status: AC
  Administered 2020-02-28: 80 mg via INTRAMUSCULAR

## 2020-02-28 NOTE — Progress Notes (Signed)
   Subjective:    Patient ID: Francisco Allen, male    DOB: 22-Apr-1961, 59 y.o.   MRN: [id number]   Chief Complaint: Hip Pain (right hip pain on going)   HPI Pt was seen on 01/23/20 for right hip pain. Depo-Medrol 80 mg IM given at that time. Was feeling better after that visit. This morning he was in the bathroom. He was getting up off the toilet when he felt like his hip joint popped. Reports that when he went to take a step after that, he almost fell to the floor because he was in so much pain. Took 2 ibuprofen today after getting home from work and then took a xanax later to calm his nerves. Onset came on suddenly this morning and pain is constant. Describes pain as aching. Reports tingling sensations going up the right side of back and down his right leg. Increased pain with walking external flexion, or sitting straight up. Sitting with weight on left side and heat helps.    Review of Systems  Constitutional: Negative.   HENT: Negative.   Respiratory: Negative.   Cardiovascular: Negative.   Gastrointestinal: Negative.   Musculoskeletal: Positive for arthralgias (right hip and tingling down right leg).  Skin: Negative.   Neurological: Negative.   Psychiatric/Behavioral: Negative.       Objective:   Physical Exam Vitals and nursing note reviewed.  HENT:     Head: Normocephalic.  Cardiovascular:     Rate and Rhythm: Normal rate and regular rhythm.  Pulmonary:     Effort: Pulmonary effort is normal.  Musculoskeletal:     Cervical back: Normal range of motion.     Right hip: Tenderness present. Decreased range of motion.     Left hip: Normal.     Comments: Pain on internal and external rotation of right hip causes pain  Skin:    General: Skin is warm and dry.  Neurological:     Mental Status: He is alert and oriented to person, place, and time.   BP 137/79   Pulse 63   Temp 98.5 F (36.9 C) (Temporal)   Ht 5\' 10"  (1.778 m)   Wt 253 lb (114.8 kg)   SpO2 96%   BMI 36.30  kg/m      Assessment & Plan:  Francisco Allen comes in today with chief complaint of Hip Pain (right hip pain on going)   Diagnosis and orders addressed:  1. Hip pain, right Rest Moist heat Avoid bending or stooping - DG HIP UNILAT W OR W/O PELVIS 2-3 VIEWS RIGHT; Future - methylPREDNISolone acetate (DEPO-MEDROL) injection 80 mg - Ambulatory referral to Orthopedic Surgery   Labs pending Health Maintenance reviewed Diet and exercise encouraged  Follow up plan: prn   Mary-Margaret Hassell Done, FNP

## 2020-02-28 NOTE — Patient Instructions (Signed)
Hip Pain The hip is the joint between the upper legs and the lower pelvis. The bones, cartilage, tendons, and muscles of your hip joint support your body and allow you to move around. Hip pain can range from a minor ache to severe pain in one or both of your hips. The pain may be felt on the inside of the hip joint near the groin, or on the outside near the buttocks and upper thigh. You may also have swelling or stiffness in your hip area. Follow these instructions at home: Managing pain, stiffness, and swelling      If directed, put ice on the painful area. To do this: ? Put ice in a plastic bag. ? Place a towel between your skin and the bag. ? Leave the ice on for 20 minutes, 2-3 times a day.  If directed, apply heat to the affected area as often as told by your health care provider. Use the heat source that your health care provider recommends, such as a moist heat pack or a heating pad. ? Place a towel between your skin and the heat source. ? Leave the heat on for 20-30 minutes. ? Remove the heat if your skin turns bright red. This is especially important if you are unable to feel pain, heat, or cold. You may have a greater risk of getting burned. Activity  Do exercises as told by your health care provider.  Avoid activities that cause pain. General instructions   Take over-the-counter and prescription medicines only as told by your health care provider.  Keep a journal of your symptoms. Write down: ? How often you have hip pain. ? The location of your pain. ? What the pain feels like. ? What makes the pain worse.  Sleep with a pillow between your legs on your most comfortable side.  Keep all follow-up visits as told by your health care provider. This is important. Contact a health care provider if:  You cannot put weight on your leg.  Your pain or swelling continues or gets worse after one week.  It gets harder to walk.  You have a fever. Get help right away  if:  You fall.  You have a sudden increase in pain and swelling in your hip.  Your hip is red or swollen or very tender to touch. Summary  Hip pain can range from a minor ache to severe pain in one or both of your hips.  The pain may be felt on the inside of the hip joint near the groin, or on the outside near the buttocks and upper thigh.  Avoid activities that cause pain.  Write down how often you have hip pain, the location of the pain, what makes it worse, and what it feels like. This information is not intended to replace advice given to you by your health care provider. Make sure you discuss any questions you have with your health care provider. Document Revised: 03/13/2019 Document Reviewed: 03/13/2019 Elsevier Patient Education  2020 Elsevier Inc. -- 

## 2020-02-29 ENCOUNTER — Ambulatory Visit: Payer: BC Managed Care – PPO | Admitting: Family Medicine

## 2020-03-05 DIAGNOSIS — Z029 Encounter for administrative examinations, unspecified: Secondary | ICD-10-CM

## 2020-03-07 ENCOUNTER — Ambulatory Visit: Payer: Self-pay | Attending: Internal Medicine

## 2020-03-07 ENCOUNTER — Telehealth: Payer: Self-pay

## 2020-03-07 DIAGNOSIS — Z23 Encounter for immunization: Secondary | ICD-10-CM

## 2020-03-07 NOTE — Progress Notes (Signed)
   Covid-19 Vaccination Clinic  Name:  Francisco Allen    MRN: [id number] DOB: January 07, 1961  03/07/2020  Mr. [name] was observed post Covid-19 immunization for 30 minutes based on pre-vaccination screening without incident. He was provided with Vaccine Information Sheet and instruction to access the V-Safe system.   Mr. [name] was instructed to call 911 with any severe reactions post vaccine: Marland Kitchen Difficulty breathing  . Swelling of face and throat  . A fast heartbeat  . A bad rash all over body  . Dizziness and weakness   Immunizations Administered    Name Date Dose VIS Date Route   Moderna COVID-19 Vaccine 03/07/2020  1:27 PM 0.5 mL 10/2019 Intramuscular   Manufacturer: Moderna   Lot: [id number]   [id number]

## 2020-03-07 NOTE — Telephone Encounter (Signed)
Per Dr. Warrick Parisian ok to excuse pt from jury duty due to patient having significant arthritis. Letter sent to be scanned. Original given to [place].

## 2020-03-20 ENCOUNTER — Telehealth: Payer: Self-pay | Admitting: Family Medicine

## 2020-03-20 NOTE — Telephone Encounter (Signed)
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

## 2020-03-20 NOTE — Telephone Encounter (Signed)
Letter written.  Patient's wife aware

## 2020-04-05 DIAGNOSIS — M545 Low back pain: Secondary | ICD-10-CM | POA: Diagnosis not present

## 2020-04-05 DIAGNOSIS — M1611 Unilateral primary osteoarthritis, right hip: Secondary | ICD-10-CM | POA: Diagnosis not present

## 2020-04-11 ENCOUNTER — Telehealth: Payer: Self-pay | Admitting: Family Medicine

## 2020-04-11 DIAGNOSIS — M25551 Pain in right hip: Secondary | ICD-10-CM

## 2020-04-11 MED ORDER — IBUPROFEN 800 MG PO TABS: 800.0000 mg | ORAL_TABLET | Freq: Three times a day (TID) | ORAL | 0 refills | Status: DC | PRN

## 2020-04-11 MED ORDER — PREDNISONE 20 MG PO TABS: ORAL_TABLET | ORAL | 0 refills | Status: DC

## 2020-04-11 NOTE — Telephone Encounter (Signed)
Patient aware and verbalized understanding. °

## 2020-04-11 NOTE — Telephone Encounter (Signed)
Since there is an ibuprofen to the pharmacy, please watch blood sugars really closely

## 2020-04-18 DIAGNOSIS — M545 Low back pain: Secondary | ICD-10-CM | POA: Diagnosis not present

## 2020-05-07 DIAGNOSIS — M48062 Spinal stenosis, lumbar region with neurogenic claudication: Secondary | ICD-10-CM | POA: Diagnosis not present

## 2020-05-07 DIAGNOSIS — Z79891 Long term (current) use of opiate analgesic: Secondary | ICD-10-CM | POA: Diagnosis not present

## 2020-05-14 DIAGNOSIS — M545 Low back pain: Secondary | ICD-10-CM | POA: Diagnosis not present

## 2020-07-10 DIAGNOSIS — K5792 Diverticulitis of intestine, part unspecified, without perforation or abscess without bleeding: Secondary | ICD-10-CM | POA: Diagnosis not present

## 2020-07-10 DIAGNOSIS — R197 Diarrhea, unspecified: Secondary | ICD-10-CM | POA: Diagnosis not present

## 2020-07-10 DIAGNOSIS — K625 Hemorrhage of anus and rectum: Secondary | ICD-10-CM | POA: Diagnosis not present

## 2020-08-28 ENCOUNTER — Ambulatory Visit (INDEPENDENT_AMBULATORY_CARE_PROVIDER_SITE_OTHER): Payer: BC Managed Care – PPO | Admitting: Family Medicine

## 2020-08-28 ENCOUNTER — Other Ambulatory Visit: Payer: Self-pay

## 2020-08-28 ENCOUNTER — Encounter: Payer: Self-pay | Admitting: Family Medicine

## 2020-08-28 VITALS — BP 130/86 | HR 73 | Temp 97.2°F | Ht 70.0 in | Wt 244.0 lb

## 2020-08-28 DIAGNOSIS — E1129 Type 2 diabetes mellitus with other diabetic kidney complication: Secondary | ICD-10-CM | POA: Diagnosis not present

## 2020-08-28 DIAGNOSIS — R809 Proteinuria, unspecified: Secondary | ICD-10-CM

## 2020-08-28 DIAGNOSIS — Z23 Encounter for immunization: Secondary | ICD-10-CM

## 2020-08-28 DIAGNOSIS — E782 Mixed hyperlipidemia: Secondary | ICD-10-CM

## 2020-08-28 DIAGNOSIS — F411 Generalized anxiety disorder: Secondary | ICD-10-CM | POA: Diagnosis not present

## 2020-08-28 DIAGNOSIS — I10 Essential (primary) hypertension: Secondary | ICD-10-CM | POA: Diagnosis not present

## 2020-08-28 LAB — BAYER DCA HB A1C WAIVED: HB A1C (BAYER DCA - WAIVED): 6.3 % (ref <7.0)

## 2020-08-28 MED ORDER — ALPRAZOLAM 1 MG PO TABS: 1.0000 mg | ORAL_TABLET | Freq: Three times a day (TID) | ORAL | 2 refills | Status: DC | PRN

## 2020-08-28 NOTE — Progress Notes (Signed)
BP 130/86   Pulse 73   Temp (!) 97.2 F (36.2 C)   Ht 5\' 10"  (1.778 m)   Wt 244 lb (110.7 kg)   SpO2 95%   BMI 35.01 kg/m    Subjective:   Patient ID: Francisco Allen, male    DOB: February 11, 1961, 59 y.o.   MRN: [id number]  HPI: Francisco Allen is a 60 y.o. male presenting on 08/28/2020 for Medical Management of Chronic Issues and Diabetes   HPI Type 2 diabetes mellitus Patient comes in today for recheck of his diabetes. Patient has been currently taking Metformin. Patient is not currently on an ACE inhibitor/ARB. Patient has not seen an ophthalmologist this year. Patient Denies any issues with their feet. The symptom started onset as an adult hypertension and hyperlipidemia ARE RELATED TO DM   Hypertension Patient is currently on metoprolol, and their blood pressure today is 130/86. Patient denies any lightheadedness or dizziness. Patient denies headaches, blurred vision, chest pains, shortness of breath, or weakness. Denies any side effects from medication and is content with current medication.   Hyperlipidemia Patient is coming in for recheck of his hyperlipidemia. The patient is currently taking fish oil and Crestor. They deny any issues with myalgias or history of liver damage from it. They deny any focal numbness or weakness or chest pain.   Anxiety recheck Current rx-Xanax 1 mg 3 times daily. # meds rx-90 Effectiveness of current meds-working well Adverse reactions form meds-none currently  Pill count performed-No Last drug screen -08/17/2019 ( high risk q54m, moderate risk q63m, low risk yearly ) Urine drug screen today- No Was the [place] reviewed-yes  If yes were their any concerning findings? -None  No flowsheet data found.   Controlled substance contract signed on: 08/17/2019  Patient's diabetes and cholesterol hypertension are more complicated by the patient's morbid obesity.  Discussed weight loss and lifestyle modification and exercise with the patient.    Relevant past medical, surgical, family and social history reviewed and updated as indicated. Interim medical history since our last visit reviewed. Allergies and medications reviewed and updated.  Review of Systems  Constitutional: Negative for chills and fever.  Eyes: Negative for visual disturbance.  Respiratory: Negative for shortness of breath and wheezing.   Cardiovascular: Negative for chest pain and leg swelling.  Musculoskeletal: Negative for back pain and gait problem.  Skin: Negative for rash.  Neurological: Negative for dizziness, weakness and light-headedness.  All other systems reviewed and are negative.   Per HPI unless specifically indicated above   Allergies as of 08/28/2020      Reactions   Bee Venom Anaphylaxis, Swelling, Other (See Comments)   Anaphylactic shock   Penicillins Anaphylaxis, Swelling   Airway closes up Has patient had a PCN reaction causing immediate rash, facial/tongue/throat swelling, SOB or lightheadedness with hypotension: Yes Has patient had a PCN reaction causing severe rash involving mucus membranes or skin necrosis: Yes Has patient had a PCN reaction that required hospitalization: [name] Has patient had a PCN reaction occurring within the last 10 years: No If all of the above answers are "NO", then may proceed with Cephalosporin use.      Medication List       Accurate as of August 28, 2020  4:02 PM. If you have any questions, ask your nurse or doctor.        STOP taking these medications   lisinopril 10 MG tablet Commonly known as: ZESTRIL Stopped by: [name], MD  predniSONE 20 MG tablet Commonly known as: DELTASONE Stopped by: Fransisca Kaufmann Kharma Sampsel, MD     TAKE these medications   ALPRAZolam 1 MG tablet Commonly known as: XANAX Take 1 tablet (1 mg total) by mouth 3 (three) times daily as needed for anxiety.   clobetasol 0.05 % external solution Commonly known as: TEMOVATE Apply 1 application topically 2 (two)  times daily.   Fish Oil 1000 MG Caps Take 1,000 mg by mouth in the morning and at bedtime.   ibuprofen 800 MG tablet Commonly known as: ADVIL Take 1 tablet (800 mg total) by mouth every 8 (eight) hours as needed.   MENS 50+ MULTI VITAMIN/MIN PO Take by mouth daily.   metFORMIN 500 MG tablet Commonly known as: GLUCOPHAGE Take 1 tablet (500 mg total) by mouth 2 (two) times daily with a meal.   metoprolol tartrate 25 MG tablet Commonly known as: LOPRESSOR Take 1 tablet (25 mg total) by mouth 2 (two) times daily.   rosuvastatin 40 MG tablet Commonly known as: Crestor Take 1 tablet (40 mg total) by mouth daily.   VITAMIN D PO Take 1 capsule daily by mouth.        Objective:   BP 130/86   Pulse 73   Temp (!) 97.2 F (36.2 C)   Ht 5\' 10"  (1.778 m)   Wt 244 lb (110.7 kg)   SpO2 95%   BMI 35.01 kg/m   Wt Readings from Last 3 Encounters:  08/28/20 244 lb (110.7 kg)  02/28/20 253 lb (114.8 kg)  02/23/20 253 lb (114.8 kg)    Physical Exam Vitals and nursing note reviewed.  Constitutional:      General: He is not in acute distress.    Appearance: He is well-developed. He is not diaphoretic.  Eyes:     General: No scleral icterus.    Conjunctiva/sclera: Conjunctivae normal.  Neck:     Thyroid: No thyromegaly.  Cardiovascular:     Rate and Rhythm: Normal rate and regular rhythm.     Heart sounds: Normal heart sounds. No murmur heard.   Pulmonary:     Effort: Pulmonary effort is normal. No respiratory distress.     Breath sounds: Normal breath sounds. No wheezing.  Musculoskeletal:        General: Normal range of motion.     Cervical back: Neck supple.  Lymphadenopathy:     Cervical: No cervical adenopathy.  Skin:    General: Skin is warm and dry.     Findings: No rash.  Neurological:     Mental Status: He is alert and oriented to person, place, and time.     Coordination: Coordination normal.  Psychiatric:        Behavior: Behavior normal.        Assessment & Plan:   Problem List Items Addressed This Visit      Cardiovascular and Mediastinum   HTN (hypertension)     Endocrine   Controlled type 2 diabetes mellitus with microalbuminuria ([place]) - Primary   Relevant Orders   Bayer DCA Hb A1c Waived (Completed)     Other   GAD (generalized anxiety disorder)   Relevant Medications   ALPRAZolam (XANAX) 1 MG tablet   Other Relevant Orders   CBC with Differential/Platelet (Completed)   Hyperlipidemia   Morbid obesity ([place])    Other Visit Diagnoses    Flu vaccine need       Relevant Orders   Flu Vaccine QUAD 6+ mos PF IM (Fluarix  Quad PF) (Completed)    A1c 6.3.  No change in current medication.  Follow up plan: Return in about 3 months (around 11/28/2020), or if symptoms worsen or fail to improve, for Anxiety and diabetes recheck.  Counseling provided for all of the vaccine components Orders Placed This Encounter  Procedures  . Flu Vaccine QUAD 6+ mos PF IM (Fluarix Quad PF)  . Bayer DCA Hb A1c Waived  . CBC with Differential/Platelet    Caryl Pina, MD [facility] 08/28/2020, 4:02 PM

## 2020-08-29 LAB — CBC WITH DIFFERENTIAL/PLATELET
Basophils Absolute: 0.1 10*3/uL (ref 0.0–0.2)
Basos: 1 %
EOS (ABSOLUTE): 0.1 10*3/uL (ref 0.0–0.4)
Eos: 1 %
Hematocrit: 50.3 % (ref 37.5–51.0)
Hemoglobin: 17.1 g/dL (ref 13.0–17.7)
Immature Grans (Abs): 0 10*3/uL (ref 0.0–0.1)
Immature Granulocytes: 1 %
Lymphocytes Absolute: 2.4 10*3/uL (ref 0.7–3.1)
Lymphs: 28 %
MCH: 30.1 pg (ref 26.6–33.0)
MCHC: 34 g/dL (ref 31.5–35.7)
MCV: 88 fL (ref 79–97)
Monocytes Absolute: 0.6 10*3/uL (ref 0.1–0.9)
Monocytes: 7 %
Neutrophils Absolute: 5.4 10*3/uL (ref 1.4–7.0)
Neutrophils: 62 %
Platelets: 196 10*3/uL (ref 150–450)
RBC: 5.69 x10E6/uL (ref 4.14–5.80)
RDW: 13 % (ref 11.6–15.4)
WBC: 8.6 10*3/uL (ref 3.4–10.8)

## 2020-09-12 DIAGNOSIS — W19XXXA Unspecified fall, initial encounter: Secondary | ICD-10-CM | POA: Diagnosis not present

## 2020-09-12 DIAGNOSIS — M25562 Pain in left knee: Secondary | ICD-10-CM | POA: Diagnosis not present

## 2020-09-12 DIAGNOSIS — R52 Pain, unspecified: Secondary | ICD-10-CM | POA: Diagnosis not present

## 2020-09-12 DIAGNOSIS — R404 Transient alteration of awareness: Secondary | ICD-10-CM | POA: Diagnosis not present

## 2020-10-18 ENCOUNTER — Other Ambulatory Visit: Payer: Self-pay

## 2020-10-18 ENCOUNTER — Encounter: Payer: Self-pay | Admitting: Family Medicine

## 2020-10-18 ENCOUNTER — Ambulatory Visit: Payer: BC Managed Care – PPO | Admitting: Family Medicine

## 2020-10-18 VITALS — BP 142/86 | HR 77 | Ht 70.0 in | Wt 246.0 lb

## 2020-10-18 DIAGNOSIS — Z01818 Encounter for other preprocedural examination: Secondary | ICD-10-CM

## 2020-10-18 NOTE — Progress Notes (Signed)
BP (!) 142/86   Pulse 77   Ht 5\' 10"  (1.778 m)   Wt 246 lb (111.6 kg)   SpO2 97%   BMI 35.30 kg/m    Subjective:   Patient ID: [name], male    DOB: 14-Sep-1961, 59 y.o.   MRN: [id number]  HPI: NAZEER ROMNEY is a 59 y.o. male presenting on 10/18/2020 for No chief complaint on file.   HPI Patient is coming in today for presurgical clearance.  He does have blood work 2 months ago with a normal CBC and A1c.  Patient denies any chest pain or shortness of breath.  He has left knee pain which is what is getting surgery for for meniscal repair and arthritis cleanup.  Relevant past medical, surgical, family and social history reviewed and updated as indicated. Interim medical history since our last visit reviewed. Allergies and medications reviewed and updated.  Review of Systems  Constitutional: Negative for chills and fever.  Eyes: Negative for discharge.  Respiratory: Negative for shortness of breath and wheezing.   Cardiovascular: Negative for chest pain and leg swelling.  Musculoskeletal: Positive for arthralgias, gait problem and joint swelling. Negative for back pain.  Skin: Negative for rash.  All other systems reviewed and are negative.   Per HPI unless specifically indicated above   Allergies as of 10/18/2020      Reactions   Bee Venom Anaphylaxis, Swelling, Other (See Comments)   Anaphylactic shock   Penicillins Anaphylaxis, Swelling   Airway closes up Has patient had a PCN reaction causing immediate rash, facial/tongue/throat swelling, SOB or lightheadedness with hypotension: Yes Has patient had a PCN reaction causing severe rash involving mucus membranes or skin necrosis: Yes Has patient had a PCN reaction that required hospitalization: [name] Has patient had a PCN reaction occurring within the last 10 years: No If all of the above answers are "NO", then may proceed with Cephalosporin use.      Medication List       Accurate as of October 18, 2020  3:08  PM. If you have any questions, ask your nurse or doctor.        ALPRAZolam 1 MG tablet Commonly known as: XANAX Take 1 tablet (1 mg total) by mouth 3 (three) times daily as needed for anxiety.   clobetasol 0.05 % external solution Commonly known as: TEMOVATE Apply 1 application topically 2 (two) times daily.   Fish Oil 1000 MG Caps Take 1,000 mg by mouth in the morning and at bedtime.   ibuprofen 800 MG tablet Commonly known as: ADVIL Take 1 tablet (800 mg total) by mouth every 8 (eight) hours as needed.   MENS 50+ MULTI VITAMIN/MIN PO Take by mouth daily.   metFORMIN 500 MG tablet Commonly known as: GLUCOPHAGE Take 1 tablet (500 mg total) by mouth 2 (two) times daily with a meal.   metoprolol tartrate 25 MG tablet Commonly known as: LOPRESSOR Take 1 tablet (25 mg total) by mouth 2 (two) times daily.   rosuvastatin 40 MG tablet Commonly known as: Crestor Take 1 tablet (40 mg total) by mouth daily.   VITAMIN D PO Take 1 capsule daily by mouth.        Objective:   BP (!) 142/86   Pulse 77   Ht 5\' 10"  (1.778 m)   Wt 246 lb (111.6 kg)   SpO2 97%   BMI 35.30 kg/m   Wt Readings from Last 3 Encounters:  10/18/20 246 lb (111.6 kg)  08/28/20 244 lb (110.7 kg)  02/28/20 253 lb (114.8 kg)    Physical Exam Vitals and nursing note reviewed.  Constitutional:      General: He is not in acute distress.    Appearance: He is well-developed and well-nourished. He is not diaphoretic.  Eyes:     General: No scleral icterus.       Right eye: No discharge.     Extraocular Movements: EOM normal.     Conjunctiva/sclera: Conjunctivae normal.     Pupils: Pupils are equal, round, and reactive to light.  Neck:     Thyroid: No thyromegaly.  Cardiovascular:     Rate and Rhythm: Normal rate and regular rhythm.     Pulses: Intact distal pulses.     Heart sounds: Normal heart sounds. No murmur heard.   Pulmonary:     Effort: Pulmonary effort is normal. No respiratory  distress.     Breath sounds: Normal breath sounds. No wheezing.  Musculoskeletal:        General: No edema.     Cervical back: Neck supple.  Lymphadenopathy:     Cervical: No cervical adenopathy.  Skin:    General: Skin is warm and dry.     Findings: No rash.  Neurological:     Mental Status: He is alert and oriented to person, place, and time.     Coordination: Coordination normal.  Psychiatric:        Mood and Affect: Mood and affect normal.        Behavior: Behavior normal.     Results for orders placed or performed in visit on 08/28/20  Bayer DCA Hb A1c Waived  Result Value Ref Range   HB A1C (BAYER DCA - WAIVED) 6.3 <7.0 %  CBC with Differential/Platelet  Result Value Ref Range   WBC 8.6 3.4 - 10.8 x10E3/uL   RBC 5.69 4.14 - 5.80 x10E6/uL   Hemoglobin 17.1 13.0 - 17.7 g/dL   Hematocrit 50.3 37.5 - 51.0 %   MCV 88 79 - 97 fL   MCH 30.1 26.6 - 33.0 pg   MCHC 34.0 31.5 - 35.7 g/dL   RDW 13.0 11.6 - 15.4 %   Platelets 196 150 - 450 x10E3/uL   Neutrophils 62 Not Estab. %   Lymphs 28 Not Estab. %   Monocytes 7 Not Estab. %   Eos 1 Not Estab. %   Basos 1 Not Estab. %   Neutrophils Absolute 5.4 1.4 - 7.0 x10E3/uL   Lymphocytes Absolute 2.4 0.7 - 3.1 x10E3/uL   Monocytes Absolute 0.6 0.1 - 0.9 x10E3/uL   EOS (ABSOLUTE) 0.1 0.0 - 0.4 x10E3/uL   Basophils Absolute 0.1 0.0 - 0.2 x10E3/uL   Immature Granulocytes 1 Not Estab. %   Immature Grans (Abs) 0.0 0.0 - 0.1 x10E3/uL    Assessment & Plan:   Problem List Items Addressed This Visit   None   Visit Diagnoses    Pre-operative clearance    -  Primary   Relevant Orders   EKG 12-Lead (Completed)      Patient cleared for surgery, diabetes under control and blood counts look good.  EKG showed normal sinus rhythm Follow up plan: Return if symptoms worsen or fail to improve.  Counseling provided for all of the vaccine components Orders Placed This Encounter  Procedures  . EKG 12-Lead    Caryl Pina,  MD [facility] 10/18/2020, 3:08 PM

## 2020-10-25 HISTORY — PX: ARTHROSCOPY KNEE W/ DRILLING: SUR92

## 2020-10-28 ENCOUNTER — Other Ambulatory Visit: Payer: Self-pay | Admitting: Family Medicine

## 2020-10-28 DIAGNOSIS — I25118 Atherosclerotic heart disease of native coronary artery with other forms of angina pectoris: Secondary | ICD-10-CM

## 2020-10-28 DIAGNOSIS — E785 Hyperlipidemia, unspecified: Secondary | ICD-10-CM

## 2020-10-28 DIAGNOSIS — E1129 Type 2 diabetes mellitus with other diabetic kidney complication: Secondary | ICD-10-CM

## 2020-11-27 ENCOUNTER — Ambulatory Visit (INDEPENDENT_AMBULATORY_CARE_PROVIDER_SITE_OTHER): Payer: BC Managed Care – PPO

## 2020-11-27 ENCOUNTER — Other Ambulatory Visit: Payer: Self-pay

## 2020-11-27 DIAGNOSIS — Z23 Encounter for immunization: Secondary | ICD-10-CM

## 2020-11-27 NOTE — Progress Notes (Signed)
   Covid-19 Vaccination Clinic  Name:  BRALYNN DONADO    MRN: [id number] DOB: 28-Dec-1960  11/27/2020  Mr. Slovacek was observed post Covid-19 immunization for 15 minutes without incident. He was provided with Vaccine Information Sheet and instruction to access the V-Safe system.   Mr. Hanssen was instructed to call 911 with any severe reactions post vaccine: Marland Kitchen Difficulty breathing  . Swelling of face and throat  . A fast heartbeat  . A bad rash all over body  . Dizziness and weakness   Immunizations Administered    Name Date Dose VIS Date Route   Moderna Covid-19 Booster Vaccine 11/27/2020  2:19 PM 0.25 mL 08/28/2020 Intramuscular   Manufacturer: Moderna   Lot: [id number]   [place]: 34287-681-15

## 2021-01-24 ENCOUNTER — Ambulatory Visit (HOSPITAL_COMMUNITY)
Admission: RE | Admit: 2021-01-24 | Discharge: 2021-01-24 | Disposition: A | Discharge status: Home / Self Care | Payer: No Typology Code available for payment source | Source: Ambulatory Visit | Attending: Family Medicine | Admitting: Family Medicine

## 2021-01-24 ENCOUNTER — Ambulatory Visit (INDEPENDENT_AMBULATORY_CARE_PROVIDER_SITE_OTHER): Payer: No Typology Code available for payment source | Admitting: Family Medicine

## 2021-01-24 ENCOUNTER — Other Ambulatory Visit: Payer: Self-pay

## 2021-01-24 ENCOUNTER — Encounter: Payer: Self-pay | Admitting: Family Medicine

## 2021-01-24 VITALS — BP 100/61 | HR 110 | Ht 70.0 in | Wt 234.0 lb

## 2021-01-24 DIAGNOSIS — K56699 Other intestinal obstruction unspecified as to partial versus complete obstruction: Secondary | ICD-10-CM

## 2021-01-24 DIAGNOSIS — R1032 Left lower quadrant pain: Secondary | ICD-10-CM | POA: Diagnosis not present

## 2021-01-24 LAB — POCT I-STAT CREATININE: Creatinine, Ser: 1.1 mg/dL (ref 0.61–1.24)

## 2021-01-24 MED ORDER — IOHEXOL 9 MG/ML PO SOLN
ORAL | Status: AC
  Filled 2021-01-24: qty 1000

## 2021-01-24 MED ORDER — IOHEXOL 300 MG/ML  SOLN
100.0000 mL | Freq: Once | INTRAMUSCULAR | Status: AC | PRN
  Administered 2021-01-24: 100 mL via INTRAVENOUS

## 2021-01-24 NOTE — Progress Notes (Signed)
BP 100/61   Pulse (!) 110   Ht 5\' 10"  (1.778 m)   Wt 234 lb (106.1 kg)   SpO2 96%   BMI 33.58 kg/m    Subjective:   Patient ID: Francisco Allen, male    DOB: 03-Apr-1961, 60 y.o.   MRN: [id number]  HPI: Francisco Allen is a 60 y.o. male presenting on 01/24/2021 for Abdominal Pain, Constipation, and Diarrhea   HPI Patient is coming in with chronic bowel issues, he gets gas and distended when he gets constipated and have diarrhea and he feels bloated and distended.  This been going on over the past couple months but he has been having this off and on for quite some time before that, he would have recurrent episodes of diverticulitis.  Looking back at his 2008 colonoscopy he had a polyp and some diverticulosis but then I noticed that they said he had severe sigmoid stenosis that I had not noted before.  This may be a lot of his issues and will do a CT scan to see if he can figure something out.  He says his pain is all over but worse on the left side.  Relevant past medical, surgical, family and social history reviewed and updated as indicated. Interim medical history since our last visit reviewed. Allergies and medications reviewed and updated.  Review of Systems  Constitutional: Negative for chills and fever.  Respiratory: Negative for shortness of breath and wheezing.   Cardiovascular: Negative for chest pain and leg swelling.  Gastrointestinal: Positive for abdominal pain, constipation, diarrhea and nausea. Negative for blood in stool and vomiting.  Musculoskeletal: Negative for back pain and gait problem.  Skin: Negative for rash.  Neurological: Negative for dizziness and light-headedness.  All other systems reviewed and are negative.   Per HPI unless specifically indicated above   Allergies as of 01/24/2021      Reactions   Bee Venom Anaphylaxis, Swelling, Other (See Comments)   Anaphylactic shock   Penicillins Anaphylaxis, Swelling   Airway closes up Has patient had a PCN  reaction causing immediate rash, facial/tongue/throat swelling, SOB or lightheadedness with hypotension: Yes Has patient had a PCN reaction causing severe rash involving mucus membranes or skin necrosis: Yes Has patient had a PCN reaction that required hospitalization: [name] Has patient had a PCN reaction occurring within the last 10 years: No If all of the above answers are "NO", then may proceed with Cephalosporin use.      Medication List       Accurate as of January 24, 2021  3:42 PM. If you have any questions, ask your nurse or doctor.        ALPRAZolam 1 MG tablet Commonly known as: XANAX Take 1 tablet (1 mg total) by mouth 3 (three) times daily as needed for anxiety.   clobetasol 0.05 % external solution Commonly known as: TEMOVATE Apply 1 application topically 2 (two) times daily.   Fish Oil 1000 MG Caps Take 1,000 mg by mouth in the morning and at bedtime.   ibuprofen 800 MG tablet Commonly known as: ADVIL Take 1 tablet (800 mg total) by mouth every 8 (eight) hours as needed.   lisinopril 10 MG tablet Commonly known as: ZESTRIL TAKE 1 TABLET DAILY   MENS 50+ MULTI VITAMIN/MIN PO Take by mouth daily.   metFORMIN 500 MG tablet Commonly known as: GLUCOPHAGE TAKE  (1)  TABLET TWICE A DAY WITH MEALS (BREAKFAST AND SUPPER)   metoprolol tartrate 25 MG tablet  Commonly known as: LOPRESSOR TAKE(1)  TABLET TWICE A DAY.   rosuvastatin 40 MG tablet Commonly known as: CRESTOR TAKE 1 TABLET DAILY   VITAMIN D PO Take 1 capsule daily by mouth.        Objective:   BP 100/61   Pulse (!) 110   Ht 5\' 10"  (1.778 m)   Wt 234 lb (106.1 kg)   SpO2 96%   BMI 33.58 kg/m   Wt Readings from Last 3 Encounters:  01/24/21 234 lb (106.1 kg)  10/18/20 246 lb (111.6 kg)  08/28/20 244 lb (110.7 kg)    Physical Exam Vitals and nursing note reviewed.  Constitutional:      General: He is not in acute distress.    Appearance: He is well-developed. He is not diaphoretic.  Eyes:      General: No scleral icterus.    Conjunctiva/sclera: Conjunctivae normal.  Neck:     Thyroid: No thyromegaly.  Abdominal:     Tenderness: There is abdominal tenderness in the right upper quadrant, right lower quadrant, epigastric area, left upper quadrant and left lower quadrant.    Musculoskeletal:        General: Normal range of motion.     Cervical back: Neck supple.  Lymphadenopathy:     Cervical: No cervical adenopathy.  Skin:    General: Skin is warm and dry.     Findings: No rash.  Neurological:     Mental Status: He is alert and oriented to person, place, and time.     Coordination: Coordination normal.  Psychiatric:        Behavior: Behavior normal.       Assessment & Plan:   Problem List Items Addressed This Visit   None   Visit Diagnoses    Stenosis colon ([place])    -  Primary   Patient had colonoscopy in 2018 diagnosed with severe sigmoid stenosis.  We will do CT scan   Relevant Orders   CT Abdomen Pelvis W Contrast   BUN+Creat   Left lower quadrant abdominal pain        Patient had a colonoscopy that showed severe sigmoid stenosis in the past, with his history of diverticulitis going order CT scan abdomen pelvis to see what we can find out about this.  May consider surgical referral to general surgery as well.  May be a surgical candidate.  Recommended enema at home  Follow up plan: Return if symptoms worsen or fail to improve.  Counseling provided for all of the vaccine components Orders Placed This Encounter  Procedures  . CT Abdomen Pelvis W Contrast  . BUN+Creat    Caryl Pina, MD [facility] 01/24/2021, 3:42 PM

## 2021-01-27 ENCOUNTER — Telehealth: Payer: Self-pay

## 2021-01-27 ENCOUNTER — Other Ambulatory Visit: Payer: Self-pay

## 2021-01-27 ENCOUNTER — Telehealth: Payer: Self-pay | Admitting: Family Medicine

## 2021-01-27 DIAGNOSIS — K56699 Other intestinal obstruction unspecified as to partial versus complete obstruction: Secondary | ICD-10-CM

## 2021-01-27 MED ORDER — NA SULFATE-K SULFATE-MG SULF 17.5-3.13-1.6 GM/177ML PO SOLN: 1.0000 | Freq: Once | ORAL | 0 refills | Status: AC

## 2021-01-27 NOTE — Telephone Encounter (Signed)
Patient did enema over the weekend and has worsened and now has some nausea and vomiting. Placed urgent referral to general surgery.  Discussed with patient. Referral to [facility] with appt pending, spoke to [facility] surgery myself nurse was called back today, trying to get him an appointment with Dr. Marcello Moores. Caryl Pina, MD [facility] 01/27/2021, 8:57 AM

## 2021-01-27 NOTE — Telephone Encounter (Signed)
I have reviewed his previous colonoscopy examinations.  In the area of concern on CT he does have severe diverticulosis with sigmoid stenosis.  I would recommend a bowel prep for a purge.  MiraLAX prep or Suprep, please.  Thereafter, take 2 doses of MiraLAX daily.  Please have him follow-up with myself or one of the advanced practitioners next week.  Thanks

## 2021-01-27 NOTE — Telephone Encounter (Signed)
Dr. Warrick Parisian called and states pt has continued to have issues with constipation and he has continued to lose weight. He ordered a CT scan last week and it showed lots of stool and a thickened area in the colon. Dr. Warrick Parisian called surgery but they referred him back to GI. He is becoming more constipated and having some N&V. He had pt do miralax and and enema on Friday and over the weekend. He feels the pt is passing diarrhea stool around the hard stool. He is not passing any formed stool. Dr. Warrick Parisian would like to know what you recommend.  IMPRESSION: Large volume of stool throughout the colon. Marked thickening of the walls of the mid to distal rectosigmoid colon with no stool or gas beyond that point may be incidental but cannot be definitively characterized. Recommend colon cancer screening to exclude mass.  Small fat containing umbilical and bilateral inguinal hernias.  Scattered thoracic and lumbar degenerative disease appears worst at L4-5.  Aortic Atherosclerosis (ICD10-I70.0).

## 2021-01-27 NOTE — Telephone Encounter (Signed)
Called pt and left him a message to call back. Suprep script sent to pharmacy. Scheduled pt to see Carl Best NP 02/04/21 at 3pm. Dr. Warrick Parisian aware.

## 2021-01-27 NOTE — Telephone Encounter (Signed)
Spoke with pt and he is aware. 

## 2021-01-28 ENCOUNTER — Encounter: Payer: Self-pay | Admitting: General Surgery

## 2021-01-28 ENCOUNTER — Ambulatory Visit (INDEPENDENT_AMBULATORY_CARE_PROVIDER_SITE_OTHER): Payer: No Typology Code available for payment source | Admitting: General Surgery

## 2021-01-28 ENCOUNTER — Other Ambulatory Visit: Payer: Self-pay

## 2021-01-28 VITALS — BP 112/79 | HR 98 | Temp 97.0°F | Resp 18 | Ht 69.0 in | Wt 232.0 lb

## 2021-01-28 DIAGNOSIS — K56699 Other intestinal obstruction unspecified as to partial versus complete obstruction: Secondary | ICD-10-CM | POA: Diagnosis not present

## 2021-01-28 DIAGNOSIS — K5732 Diverticulitis of large intestine without perforation or abscess without bleeding: Secondary | ICD-10-CM

## 2021-01-28 MED ORDER — NEOMYCIN SULFATE 500 MG PO TABS: 1000.0000 mg | ORAL_TABLET | ORAL | 0 refills | Status: DC

## 2021-01-28 MED ORDER — DULCOLAX 5 MG PO TBEC
20.0000 mg | DELAYED_RELEASE_TABLET | Freq: Once | ORAL | 0 refills | Status: AC
Start: 2021-01-28 — End: 2021-01-28

## 2021-01-28 MED ORDER — METRONIDAZOLE 500 MG PO TABS: 1000.0000 mg | ORAL_TABLET | ORAL | 0 refills | Status: DC

## 2021-01-28 NOTE — Progress Notes (Signed)
[facility] History and Physical  Reason for Referral: Sigmoid stenosis  Referring Physician:  Dr. Warrick Parisian   Chief Complaint    New Patient (Initial Visit)      Francisco Allen is a 60 y.o. male.  HPI:  Francisco Allen is a very sweet 60 yo who has a history of HLD, GERD, diabetes who came in with complaints to his PCP of chronic issues with constipation and some diarrhea. He has been having of obstruction with nausea, vomiting and bloating. He was worked up by his PCP who noted that on his last colonoscopy in 2018 he was noted to have severe sigmoid stenosis and recommendation for use of a pediatric scope at his next colonoscopy. He reports that his issues have worsened over the last several weeks. Dr. Warrick Parisian obtained a CT that demonstrated a distended colon with stool and sigmoid colon with thickening and signs of obstruction given the change in caliber of the colon.  In 2018 he had some polyps removed but no signs of any cancer in this area. He has had repeated episodes of diverticulitis in the past and says that he normally receives 2 rounds of antibiotics annually. His very first episode resulted in a hospitalization but he has never had a drain.   The patient denies any chest pain or SOB. He underwent Arthroscopy on his left knee in January at the [facility] in [place] with Dr. Berenice Primas, orthopedics and did fine with anesthesia. He does have a family history of cardiac disease and brothers that had CABG. He reports he had a cardiac catheterization in the past and that he was found to have to minor blockages but did not require any stents. He had been managed medically and denies any symptoms.   ECHO 2018-  Study Conclusions   - Left ventricle: The cavity size was normal. There was mild  concentric hypertrophy. Systolic function was normal. The  estimated ejection fraction was in the range of 55% to 60%. Wall  motion was normal; there were no regional wall  motion  abnormalities. Left ventricular diastolic function parameters  were normal.   CATH 2018 -  Prox LAD to Mid LAD lesion is 30% stenosed.  Ost 1st Diag to 1st Diag lesion is 50% stenosed.  Mid RCA to Dist RCA lesion is 25% stenosed.  Prox Cx to Mid Cx lesion is 25% stenosed.  The left ventricular systolic function is normal.  LV end diastolic pressure is normal.  The left ventricular ejection fraction is 55-65% by visual estimate.  There is no aortic valve stenosis.   Continue aggressive preventive therapy.  I encouraged smoking cessation.  Past Medical History:  Diagnosis Date  . Arthritis    "all over my body I think" (09/23/2017)  . Colon polyp   . Diverticulitis   . GERD (gastroesophageal reflux disease)    hx  . Hyperlipidemia   . Pre-diabetes     Past Surgical History:  Procedure Laterality Date  . ARTHROSCOPY KNEE W/ DRILLING Left 10/25/2020  . COLONOSCOPY  X ~ 2   "brother passed away w/colon cancer; I've probably had 4 colonoscopies done since he passed" (09/23/2017)  . COLONOSCOPY W/ BIOPSIES AND POLYPECTOMY  X 2  . CYST EXCISION  2004   Roof of mouth  . HEMORRHOID SURGERY     "lanced"  . LEFT HEART CATH AND CORONARY ANGIOGRAPHY N/A 09/24/2017   Procedure: LEFT HEART CATH AND CORONARY ANGIOGRAPHY;  Surgeon: Jettie Booze, MD;  Location: [facility]  INVASIVE CV LAB;  Service: Cardiovascular;  Laterality: N/A;  . SHOULDER ARTHROSCOPY WITH ROTATOR CUFF REPAIR Right     Family History  Problem Relation Age of Onset  . Colon cancer Brother   . Dementia Mother   . CVA Father   . Sudden death Sister 78       in her sleep, was told a heart attack  . Heart disease Brother        CABG  . Heart disease Brother        CABG  . Heart disease Brother        CABG    Social History   Tobacco Use  . Smoking status: Current Every Day Smoker    Packs/day: 1.00    Years: 34.00    Pack years: 34.00    Types: Cigarettes  . Smokeless tobacco: Never  Used  Vaping Use  . Vaping Use: Never used  Substance Use Topics  . Alcohol use: No  . Drug use: No    Medications: I have reviewed the patient's current medications. Allergies as of 01/28/2021      Reactions   Bee Venom Anaphylaxis, Swelling, Other (See Comments)   Anaphylactic shock   Penicillins Anaphylaxis, Swelling   Airway closes up Has patient had a PCN reaction causing immediate rash, facial/tongue/throat swelling, SOB or lightheadedness with hypotension: Yes Has patient had a PCN reaction causing severe rash involving mucus membranes or skin necrosis: Yes Has patient had a PCN reaction that required hospitalization: [name] Has patient had a PCN reaction occurring within the last 10 years: No If all of the above answers are "NO", then may proceed with Cephalosporin use.      Medication List       Accurate as of January 28, 2021 12:09 PM. If you have any questions, ask your nurse or doctor.        ALPRAZolam 1 MG tablet Commonly known as: XANAX Take 1 tablet (1 mg total) by mouth 3 (three) times daily as needed for anxiety.   clobetasol 0.05 % external solution Commonly known as: TEMOVATE Apply 1 application topically 2 (two) times daily.   Dulcolax 5 MG EC tablet Generic drug: bisacodyl Take 4 tablets (20 mg total) by mouth once for 1 dose. Started by: [name], MD   Fish Oil 1000 MG Caps Take 1,000 mg by mouth in the morning and at bedtime.   ibuprofen 800 MG tablet Commonly known as: ADVIL Take 1 tablet (800 mg total) by mouth every 8 (eight) hours as needed.   lisinopril 10 MG tablet Commonly known as: ZESTRIL TAKE 1 TABLET DAILY   MENS 50+ MULTI VITAMIN/MIN PO Take by mouth daily.   metFORMIN 500 MG tablet Commonly known as: GLUCOPHAGE TAKE  (1)  TABLET TWICE A DAY WITH MEALS (BREAKFAST AND SUPPER)   metoprolol tartrate 25 MG tablet Commonly known as: LOPRESSOR TAKE(1)  TABLET TWICE A DAY.   metroNIDAZOLE 500 MG tablet Commonly known as:  Flagyl Take 2 tablets (1,000 mg total) by mouth as directed. Take 2 Flagyl 500mg  tablets at 2 pm, 3pm and 10 pm. Started by: [name], MD   neomycin 500 MG tablet Commonly known as: MYCIFRADIN Take 2 tablets (1,000 mg total) by mouth as directed. Take 2 neomycin 500mg  tablets at 2 pm, 3pm and 10 pm. Started by: [name], MD   rosuvastatin 40 MG tablet Commonly known as: CRESTOR TAKE 1 TABLET DAILY   VITAMIN D PO Take  1 capsule daily by mouth.        ROS:  A comprehensive review of systems was negative except for: Gastrointestinal: positive for abdominal pain, constipation, diarrhea, nausea and bloating  Blood pressure 112/79, pulse 98, temperature (!) 97 F (36.1 C), temperature source Other (Comment), resp. rate 18, height 5\' 9"  (1.753 m), weight 232 lb (105.2 kg), SpO2 97 %. Physical Exam Vitals reviewed.  HENT:     Head: Normocephalic.     Nose: Nose normal.  Eyes:     Extraocular Movements: Extraocular movements intact.  Cardiovascular:     Rate and Rhythm: Normal rate and regular rhythm.  Pulmonary:     Effort: Pulmonary effort is normal.     Breath sounds: Normal breath sounds.  Abdominal:     General: There is distension.     Palpations: Abdomen is soft.     Tenderness: There is no abdominal tenderness. There is no guarding or rebound.  Musculoskeletal:        General: No swelling. Normal range of motion.     Cervical back: Normal range of motion.  Skin:    General: Skin is warm and dry.  Neurological:     General: No focal deficit present.     Mental Status: He is alert and oriented to person, place, and time.  Psychiatric:        Mood and Affect: Mood normal.        Behavior: Behavior normal.        Thought Content: Thought content normal.        Judgment: Judgment normal.     Results: CLINICAL DATA:  Intermittent left abdominal pain for 2 days.  EXAM: CT ABDOMEN AND PELVIS WITH CONTRAST  TECHNIQUE: Multidetector CT imaging  of the abdomen and pelvis was performed using the standard protocol following bolus administration of intravenous contrast.  CONTRAST:  100 mL OMNIPAQUE IOHEXOL 300 MG/ML  SOLN  COMPARISON:  None.  FINDINGS: Lower chest: Lung bases clear.  No pleural or pericardial effusion.  Hepatobiliary: No focal liver abnormality is seen. No gallstones, gallbladder wall thickening, or biliary dilatation. A few punctate calcifications are seen in the liver compatible with old granulomatous disease.  Pancreas: Unremarkable. No pancreatic ductal dilatation or surrounding inflammatory changes.  Spleen: Normal in size without focal abnormality.  Adrenals/Urinary Tract: The adrenal glands appear normal. No renal stones or hydronephrosis. No worrisome renal lesion. The patient has a few bilateral renal cysts. The largest cyst is on the right and measures 6 cm in diameter. Ureters and urinary bladder are unremarkable.  Stomach/Bowel: There is a large volume of stool throughout the colon. The walls of the mid to distal rectosigmoid colon appear thickened and under distended with no stool or gas beyond that point. The stomach and appendix appear normal. There is some fecalization bowel contents in the distal ileum.  Vascular/Lymphatic: Aortic atherosclerosis. No enlarged abdominal or pelvic lymph nodes.  Reproductive: Prostate is unremarkable.  Other: Small fat containing umbilical hernia. Small fat containing bilateral inguinal hernias are also seen.  Musculoskeletal: Advanced facet degenerative disease at L4-5 results in 0.9 cm anterolisthesis. Scattered degenerative disc disease in thoracic and lumbar spine noted.  IMPRESSION: Large volume of stool throughout the colon. Marked thickening of the walls of the mid to distal rectosigmoid colon with no stool or gas beyond that point may be incidental but cannot be definitively characterized. Recommend colon cancer screening to  exclude mass.  Small fat containing umbilical and bilateral inguinal hernias.  Scattered thoracic and lumbar degenerative disease appears worst at L4-5.  Aortic Atherosclerosis (ICD10-I70.0).   Electronically Signed   By: Inge Rise M.D.   On: 01/24/2021 19:15  Assessment & Plan:  KHAMANI FAIRLEY is a 60 y.o. male with a partial colonic obstruction from likely chronic sigmoid stenosis from diverticulitis. He needs an urgent colectomy given the issues with nausea and bloating. He is only able to have diarrhea around what looks like formed stool with miralax. He is now on miralax and I recommended clear diet to Dr. Warrick Parisian yesterday.   Discussed open colectomy given the degree of distention of the bowel from the blockage. Discussed risk of bleeding, infection, anastomotic leak, need for colostomy if he is not prepped enough, risk of injury to other organs, ureter. Discussed post operative course.  Discussed risk of it being a cancer but unlikely since it is chronic.  Discussed a liquid diet and Miralax BID for now. Discussed starting a bowel prep on Monday and Clear liquids only. Discussed continued bowel prep on Tuesday and antibiotic prep. Discussed importance of having clear stool so we can do an anastomosis.  Discussed colostomy for at least 3-4 months before reversal if unable to do anastomosis.   Leading up to Surgery Do Miralax twice to three times a day and stick with a liquid diet/ chicken noodle soup is ok.  Buy from the Store: 2 -Miralax bottle [phone]).  2- Gatorade 64 oz (not red). Dulcolax tablets.   On Monday 3/28 start doing only CLEAR LIQUIDS that you can see through and no noodles.  Take a bottle of Miralax 288g and mix it wit ha 64 ounce Gatorade (Not red) and start drinking it at 10 AM and drink it throughout the day finishing by 4PM.  On Tuesday 3/29/ The Day Prior to Surgery: Take 4 ducolax tablets at 7am with water. Drink plenty of CLEAR LIQUIDS all  day to avoid dehydration, no solid food.    Mix the bottle of Miralax and 64 oz of Gatorade and drink this mixture starting at 10am. Drink it gradually over the next few hours, 8 ounces every 15-30 minutes until it is gone. Finish this by 2pm.  Take 2 neomycin 500mg  tablets and 2 metronidazole 500mg  tablets at 2 pm. Take 2 neomycin 500mg  tablets and 2 metronidazole 500mg  tablets at 3pm. Take 2 neomycin 500mg  tablets and 2 metronidazole 500mg  tablets at 10pm.    Do not eat or drink anything after midnight the night before your surgery.  Do not eat or drink anything that morning, and take medications as instructed by the hospital staff on your preoperative visit.     All questions were answered to the satisfaction of the patient.    [name] 01/28/2021, 12:09 PM

## 2021-01-28 NOTE — Patient Instructions (Signed)
Leading up to Surgery Do Miralax twice to three times a day and stick with a liquid diet/ chicken noodle soup is ok.  Buy from the Store: 2 -Miralax bottle [phone]).  2- Gatorade 64 oz (not red). Dulcolax tablets.   On Monday 3/28 start doing only CLEAR LIQUIDS that you can see through and no noodles.  Take a bottle of Miralax 288g and mix it wit ha 64 ounce Gatorade (Not red) and start drinking it at 10 AM and drink it throughout the day finishing by 4PM.  On Tuesday 3/29/ The Day Prior to Surgery: Take 4 ducolax tablets at 7am with water. Drink plenty of CLEAR LIQUIDS all day to avoid dehydration, no solid food.    Mix the bottle of Miralax and 64 oz of Gatorade and drink this mixture starting at 10am. Drink it gradually over the next few hours, 8 ounces every 15-30 minutes until it is gone. Finish this by 2pm.  Take 2 neomycin 500mg  tablets and 2 metronidazole 500mg  tablets at 2 pm. Take 2 neomycin 500mg  tablets and 2 metronidazole 500mg  tablets at 3pm. Take 2 neomycin 500mg  tablets and 2 metronidazole 500mg  tablets at 10pm.    Do not eat or drink anything after midnight the night before your surgery.  Do not eat or drink anything that morning, and take medications as instructed by the hospital staff on your preoperative visit.

## 2021-01-29 ENCOUNTER — Encounter: Payer: Self-pay | Admitting: General Surgery

## 2021-01-29 NOTE — H&P (Signed)
[facility] History and Physical  Reason for Referral: Sigmoid stenosis  Referring Physician:  Dr. Warrick Parisian      Chief Complaint    New Patient (Initial Visit)      Francisco Allen is a 60 y.o. male.  HPI:  Francisco Allen is a very sweet 60 yo who has a history of HLD, GERD, diabetes who came in with complaints to his PCP of chronic issues with constipation and some diarrhea. He has been having of obstruction with nausea, vomiting and bloating. He was worked up by his PCP who noted that on his last colonoscopy in 2018 he was noted to have severe sigmoid stenosis and recommendation for use of a pediatric scope at his next colonoscopy. He reports that his issues have worsened over the last several weeks. Dr. Warrick Parisian obtained a CT that demonstrated a distended colon with stool and sigmoid colon with thickening and signs of obstruction given the change in caliber of the colon.  In 2018 he had some polyps removed but no signs of any cancer in this area. He has had repeated episodes of diverticulitis in the past and says that he normally receives 2 rounds of antibiotics annually. His very first episode resulted in a hospitalization but he has never had a drain.   The patient denies any chest pain or SOB. He underwent Arthroscopy on his left knee in January at the [facility] in [place] with Dr. Berenice Primas, orthopedics and did fine with anesthesia. He does have a family history of cardiac disease and brothers that had CABG. He reports he had a cardiac catheterization in the past and that he was found to have to minor blockages but did not require any stents. He had been managed medically and denies any symptoms.   ECHO 2018-  Study Conclusions   - Left ventricle: The cavity size was normal. There was mild  concentric hypertrophy. Systolic function was normal. The  estimated ejection fraction was in the range of 55% to 60%. Wall  motion was normal; there were no  regional wall motion  abnormalities. Left ventricular diastolic function parameters  were normal.   CATH 2018 -  Prox LAD to Mid LAD lesion is 30% stenosed.  Ost 1st Diag to 1st Diag lesion is 50% stenosed.  Mid RCA to Dist RCA lesion is 25% stenosed.  Prox Cx to Mid Cx lesion is 25% stenosed.  The left ventricular systolic function is normal.  LV end diastolic pressure is normal.  The left ventricular ejection fraction is 55-65% by visual estimate.  There is no aortic valve stenosis.  Continue aggressive preventive therapy. I encouraged smoking cessation.      Past Medical History:  Diagnosis Date  . Arthritis    "all over my body I think" (09/23/2017)  . Colon polyp   . Diverticulitis   . GERD (gastroesophageal reflux disease)    hx  . Hyperlipidemia   . Pre-diabetes          Past Surgical History:  Procedure Laterality Date  . ARTHROSCOPY KNEE W/ DRILLING Left 10/25/2020  . COLONOSCOPY  X ~ 2   "brother passed away w/colon cancer; I've probably had 4 colonoscopies done since he passed" (09/23/2017)  . COLONOSCOPY W/ BIOPSIES AND POLYPECTOMY  X 2  . CYST EXCISION  2004   Ro[place] of mouth  . HEMORRHOID SURGERY     "lanced"  . LEFT HEART CATH AND CORONARY ANGIOGRAPHY N/A 09/24/2017   Procedure: LEFT HEART CATH AND CORONARY  ANGIOGRAPHY;  Surgeon: Jettie Booze, MD;  Location: [facility];  Service: Cardiovascular;  Laterality: N/A;  . SHOULDER ARTHROSCOPY WITH ROTATOR CUFF REPAIR Right          Family History  Problem Relation Age of Onset  . Colon cancer Brother   . Dementia Mother   . CVA Father   . Sudden death Sister 67       in her sleep, was told a heart attack  . Heart disease Brother        CABG  . Heart disease Brother        CABG  . Heart disease Brother        CABG    Social History        Tobacco Use  . Smoking status: Current Every Day Smoker    Packs/day: 1.00    Years:  34.00    Pack years: 34.00    Types: Cigarettes  . Smokeless tobacco: Never Used  Vaping Use  . Vaping Use: Never used  Substance Use Topics  . Alcohol use: No  . Drug use: No    Medications: I have reviewed the patient's current medications.      Allergies as of 01/28/2021      Reactions   Bee Venom Anaphylaxis, Swelling, Other (See Comments)   Anaphylactic shock   Penicillins Anaphylaxis, Swelling   Airway closes up Has patient had a PCN reaction causing immediate rash, facial/tongue/throat swelling, SOB or lightheadedness with hypotension: Yes Has patient had a PCN reaction causing severe rash involving mucus membranes or skin necrosis: Yes Has patient had a PCN reaction that required hospitalization: [name] Has patient had a PCN reaction occurring within the last 10 years: No If all of the above answers are "NO", then may proceed with Cephalosporin use.         Medication List       Accurate as of January 28, 2021 12:09 PM. If you have any questions, ask your nurse or doctor.        ALPRAZolam 1 MG tablet Commonly known as: XANAX Take 1 tablet (1 mg total) by mouth 3 (three) times daily as needed for anxiety.   clobetasol 0.05 % external solution Commonly known as: TEMOVATE Apply 1 application topically 2 (two) times daily.   Dulcolax 5 MG EC tablet Generic drug: bisacodyl Take 4 tablets (20 mg total) by mouth once for 1 dose. Started by: [name], MD   Fish Oil 1000 MG Caps Take 1,000 mg by mouth in the morning and at bedtime.   ibuprofen 800 MG tablet Commonly known as: ADVIL Take 1 tablet (800 mg total) by mouth every 8 (eight) hours as needed.   lisinopril 10 MG tablet Commonly known as: ZESTRIL TAKE 1 TABLET DAILY   MENS 50+ MULTI VITAMIN/MIN PO Take by mouth daily.   metFORMIN 500 MG tablet Commonly known as: GLUCOPHAGE TAKE  (1)  TABLET TWICE A DAY WITH MEALS (BREAKFAST AND SUPPER)   metoprolol tartrate 25 MG  tablet Commonly known as: LOPRESSOR TAKE(1)  TABLET TWICE A DAY.   metroNIDAZOLE 500 MG tablet Commonly known as: Flagyl Take 2 tablets (1,000 mg total) by mouth as directed. Take 2 Flagyl 500mg  tablets at 2 pm, 3pm and 10 pm. Started by: [name], MD   neomycin 500 MG tablet Commonly known as: MYCIFRADIN Take 2 tablets (1,000 mg total) by mouth as directed. Take 2 neomycin 500mg  tablets at 2 pm, 3pm and  10 pm. Started by: [name], MD   rosuvastatin 40 MG tablet Commonly known as: CRESTOR TAKE 1 TABLET DAILY   VITAMIN D PO Take 1 capsule daily by mouth.        ROS:  A comprehensive review of systems was negative except for: Gastrointestinal: positive for abdominal pain, constipation, diarrhea, nausea and bloating  Blood pressure 112/79, pulse 98, temperature (!) 97 F (36.1 C), temperature source Other (Comment), resp. rate 18, height 5\' 9"  (1.753 m), weight 232 lb (105.2 kg), SpO2 97 %. Physical Exam Vitals reviewed.  HENT:     Head: Normocephalic.     Nose: Nose normal.  Eyes:     Extraocular Movements: Extraocular movements intact.  Cardiovascular:     Rate and Rhythm: Normal rate and regular rhythm.  Pulmonary:     Effort: Pulmonary effort is normal.     Breath sounds: Normal breath sounds.  Abdominal:     General: There is distension.     Palpations: Abdomen is soft.     Tenderness: There is no abdominal tenderness. There is no guarding or rebound.  Musculoskeletal:        General: No swelling. Normal range of motion.     Cervical back: Normal range of motion.  Skin:    General: Skin is warm and dry.  Neurological:     General: No focal deficit present.     Mental Status: He is alert and oriented to person, place, and time.  Psychiatric:        Mood and Affect: Mood normal.        Behavior: Behavior normal.        Thought Content: Thought content normal.        Judgment: Judgment normal.     Results: CLINICAL DATA:  Intermittent left abdominal pain for 2 days.  EXAM: CT ABDOMEN AND PELVIS WITH CONTRAST  TECHNIQUE: Multidetector CT imaging of the abdomen and pelvis was performed using the standard protocol following bolus administration of intravenous contrast.  CONTRAST: 100 mL OMNIPAQUE IOHEXOL 300 MG/ML SOLN  COMPARISON: None.  FINDINGS: Lower chest: Lung bases clear. No pleural or pericardial effusion.  Hepatobiliary: No focal liver abnormality is seen. No gallstones, gallbladder wall thickening, or biliary dilatation. A few punctate calcifications are seen in the liver compatible with old granulomatous disease.  Pancreas: Unremarkable. No pancreatic ductal dilatation or surrounding inflammatory changes.  Spleen: Normal in size without focal abnormality.  Adrenals/Urinary Tract: The adrenal glands appear normal. No renal stones or hydronephrosis. No worrisome renal lesion. The patient has a few bilateral renal cysts. The largest cyst is on the right and measures 6 cm in diameter. Ureters and urinary bladder are unremarkable.  Stomach/Bowel: There is a large volume of stool throughout the colon. The walls of the mid to distal rectosigmoid colon appear thickened and under distended with no stool or gas beyond that point. The stomach and appendix appear normal. There is some fecalization bowel contents in the distal ileum.  Vascular/Lymphatic: Aortic atherosclerosis. No enlarged abdominal or pelvic lymph nodes.  Reproductive: Prostate is unremarkable.  Other: Small fat containing umbilical hernia. Small fat containing bilateral inguinal hernias are also seen.  Musculoskeletal: Advanced facet degenerative disease at L4-5 results in 0.9 cm anterolisthesis. Scattered degenerative disc disease in thoracic and lumbar spine noted.  IMPRESSION: Large volume of stool throughout the colon. Marked thickening of the walls of the mid to distal rectosigmoid colon with  no stool or gas beyond that point may be  incidental but cannot be definitively characterized. Recommend colon cancer screening to exclude mass.  Small fat containing umbilical and bilateral inguinal hernias.  Scattered thoracic and lumbar degenerative disease appears worst at L4-5.  Aortic Atherosclerosis (ICD10-I70.0).   Electronically Signed By: Inge Rise M.D. On: 01/24/2021 19:15  Assessment & Plan:  JAGGER DEMONTE is a 60 y.o. male with a partial colonic obstruction from likely chronic sigmoid stenosis from diverticulitis. He needs an urgent colectomy given the issues with nausea and bloating. He is only able to have diarrhea around what looks like formed stool with miralax. He is now on miralax and I recommended clear diet to Dr. Warrick Parisian yesterday.   Discussed open colectomy given the degree of distention of the bowel from the blockage. Discussed risk of bleeding, infection, anastomotic leak, need for colostomy if he is not prepped enough, risk of injury to other organs, ureter. Discussed post operative course.  Discussed risk of it being a cancer but unlikely since it is chronic.  Discussed a liquid diet and Miralax BID for now. Discussed starting a bowel prep on Monday and Clear liquids only. Discussed continued bowel prep on Tuesday and antibiotic prep. Discussed importance of having clear stool so we can do an anastomosis.  Discussed colostomy for at least 3-4 months before reversal if unable to do anastomosis.   Leading up to Surgery Do Miralax twice to three times a day and stick with a liquid diet/ chicken noodle soup is ok.  Buy from the Store: 2 -Miralax bottle [phone]).  2- Gatorade 64 oz (not red). Dulcolax tablets.   On Monday 3/28 start doing only CLEAR LIQUIDS that you can see through and no noodles.  Take a bottle of Miralax 288g and mix it wit ha 64 ounce Gatorade (Not red) and start drinking it at 10 AM and drink it throughout the day  finishing by 4PM.  On Tuesday 3/29/ The Day Prior to Surgery: Take 4 ducolax tablets at 7am with water. Drink plenty of CLEAR LIQUIDS all day to avoid dehydration, no solid food.   Mix thebottle of Miralax and 64 oz of Gatorade and drink this mixture starting at 10am. Drinkit gradually over the next few hours,8 ounces every 15-30 minutes until it is gone. Finish this by 2pm.  Take 2 neomycin 500mg  tablets and 2 metronidazole 500mg  tablets at 2 pm. Take 2 neomycin 500mg  tablets and 2 metronidazole 500mg  tablets at 3pm. Take 2 neomycin 500mg  tablets and 2 metronidazole 500mg  tablets at 10pm.   Do not eat or drink anything after midnight the night before your surgery.  Do not eat or drink anything that morning, and take medications as instructed by the hospital staff on your preoperative visit.    All questions were answered to the satisfaction of the patient.    [name] 01/28/2021, 12:09 PM

## 2021-01-30 NOTE — Telephone Encounter (Signed)
Pt is having some difficulty having bowel movements. He has done an enema, taking miralax bid and trying to drink but he can not drink a lot due to the fact that he is blocked up and it makes him feel like he is going to vomit. MD recommends he go to the ER. Called patient but had to leave a message - sent mychart message with recommendations.

## 2021-01-31 ENCOUNTER — Inpatient Hospital Stay (HOSPITAL_COMMUNITY)
Admission: EM | Admit: 2021-01-31 | Discharge: 2021-02-05 | DRG: 330 | Disposition: A | Discharge status: Home Health | Payer: No Typology Code available for payment source | Attending: General Surgery | Admitting: General Surgery

## 2021-01-31 ENCOUNTER — Emergency Department (HOSPITAL_COMMUNITY): Payer: No Typology Code available for payment source | Admitting: Anesthesiology

## 2021-01-31 ENCOUNTER — Emergency Department (HOSPITAL_COMMUNITY): Payer: No Typology Code available for payment source

## 2021-01-31 ENCOUNTER — Other Ambulatory Visit: Payer: Self-pay

## 2021-01-31 ENCOUNTER — Encounter (HOSPITAL_COMMUNITY)
Admission: EM | Disposition: A | Discharge status: Home Health | Payer: Self-pay | Source: Home / Self Care | Attending: General Surgery

## 2021-01-31 ENCOUNTER — Encounter (HOSPITAL_COMMUNITY): Payer: Self-pay | Admitting: Emergency Medicine

## 2021-01-31 DIAGNOSIS — I959 Hypotension, unspecified: Secondary | ICD-10-CM | POA: Diagnosis not present

## 2021-01-31 DIAGNOSIS — E871 Hypo-osmolality and hyponatremia: Secondary | ICD-10-CM | POA: Diagnosis present

## 2021-01-31 DIAGNOSIS — K56609 Unspecified intestinal obstruction, unspecified as to partial versus complete obstruction: Secondary | ICD-10-CM | POA: Diagnosis present

## 2021-01-31 DIAGNOSIS — E785 Hyperlipidemia, unspecified: Secondary | ICD-10-CM | POA: Diagnosis present

## 2021-01-31 DIAGNOSIS — Z79899 Other long term (current) drug therapy: Secondary | ICD-10-CM

## 2021-01-31 DIAGNOSIS — R7303 Prediabetes: Secondary | ICD-10-CM | POA: Diagnosis present

## 2021-01-31 DIAGNOSIS — Z7984 Long term (current) use of oral hypoglycemic drugs: Secondary | ICD-10-CM | POA: Diagnosis not present

## 2021-01-31 DIAGNOSIS — Z823 Family history of stroke: Secondary | ICD-10-CM

## 2021-01-31 DIAGNOSIS — K5730 Diverticulosis of large intestine without perforation or abscess without bleeding: Secondary | ICD-10-CM | POA: Diagnosis present

## 2021-01-31 DIAGNOSIS — F419 Anxiety disorder, unspecified: Secondary | ICD-10-CM | POA: Diagnosis present

## 2021-01-31 DIAGNOSIS — K219 Gastro-esophageal reflux disease without esophagitis: Secondary | ICD-10-CM | POA: Diagnosis present

## 2021-01-31 DIAGNOSIS — Z880 Allergy status to penicillin: Secondary | ICD-10-CM | POA: Diagnosis not present

## 2021-01-31 DIAGNOSIS — Z8249 Family history of ischemic heart disease and other diseases of the circulatory system: Secondary | ICD-10-CM | POA: Diagnosis not present

## 2021-01-31 DIAGNOSIS — F17210 Nicotine dependence, cigarettes, uncomplicated: Secondary | ICD-10-CM | POA: Diagnosis present

## 2021-01-31 DIAGNOSIS — Z91030 Bee allergy status: Secondary | ICD-10-CM | POA: Diagnosis not present

## 2021-01-31 DIAGNOSIS — K56699 Other intestinal obstruction unspecified as to partial versus complete obstruction: Principal | ICD-10-CM

## 2021-01-31 DIAGNOSIS — K5732 Diverticulitis of large intestine without perforation or abscess without bleeding: Secondary | ICD-10-CM | POA: Diagnosis present

## 2021-01-31 DIAGNOSIS — Z20822 Contact with and (suspected) exposure to covid-19: Secondary | ICD-10-CM | POA: Diagnosis present

## 2021-01-31 HISTORY — PX: PARTIAL COLECTOMY: SHX5273

## 2021-01-31 HISTORY — PX: COLOSTOMY: SHX63

## 2021-01-31 HISTORY — DX: Unspecified intestinal obstruction, unspecified as to partial versus complete obstruction: K56.609

## 2021-01-31 LAB — ABO/RH: ABO/RH(D): A POS

## 2021-01-31 LAB — CBC WITH DIFFERENTIAL/PLATELET
Band Neutrophils: 7 %
Basophils Absolute: 0 10*3/uL (ref 0.0–0.1)
Basophils Relative: 0 %
Eosinophils Absolute: 0 10*3/uL (ref 0.0–0.5)
Eosinophils Relative: 0 %
HCT: 51 % (ref 39.0–52.0)
Hemoglobin: 17.1 g/dL — ABNORMAL HIGH (ref 13.0–17.0)
Lymphocytes Relative: 9 %
Lymphs Abs: 1.6 10*3/uL (ref 0.7–4.0)
MCH: 29.1 pg (ref 26.0–34.0)
MCHC: 33.5 g/dL (ref 30.0–36.0)
MCV: 86.7 fL (ref 80.0–100.0)
Metamyelocytes Relative: 4 %
Monocytes Absolute: 1.2 10*3/uL — ABNORMAL HIGH (ref 0.1–1.0)
Monocytes Relative: 7 %
Neutro Abs: 14 10*3/uL — ABNORMAL HIGH (ref 1.7–7.7)
Neutrophils Relative %: 73 %
Platelets: 297 10*3/uL (ref 150–400)
RBC: 5.88 MIL/uL — ABNORMAL HIGH (ref 4.22–5.81)
RDW: 13.7 % (ref 11.5–15.5)
WBC: 17.5 10*3/uL — ABNORMAL HIGH (ref 4.0–10.5)
nRBC: 0 % (ref 0.0–0.2)

## 2021-01-31 LAB — COMPREHENSIVE METABOLIC PANEL
ALT: 22 U/L (ref 0–44)
AST: 18 U/L (ref 15–41)
Albumin: 3.5 g/dL (ref 3.5–5.0)
Alkaline Phosphatase: 107 U/L (ref 38–126)
Anion gap: 12 (ref 5–15)
BUN: 15 mg/dL (ref 8–23)
CO2: 22 mmol/L (ref 22–32)
Calcium: 8.4 mg/dL — ABNORMAL LOW (ref 8.9–10.3)
Chloride: 97 mmol/L — ABNORMAL LOW (ref 98–111)
Creatinine, Ser: 1.11 mg/dL (ref 0.61–1.24)
GFR, Estimated: >60 mL/min (ref >60)
Glucose, Bld: 145 mg/dL — ABNORMAL HIGH (ref 70–99)
Potassium: 4.1 mmol/L (ref 3.5–5.1)
Sodium: 131 mmol/L — ABNORMAL LOW (ref 135–145)
Total Bilirubin: 0.9 mg/dL (ref 0.3–1.2)
Total Protein: 7.5 g/dL (ref 6.5–8.1)

## 2021-01-31 LAB — LIPASE, BLOOD: Lipase: 38 U/L (ref 11–51)

## 2021-01-31 LAB — RESP PANEL BY RT-PCR (FLU A&B, COVID) ARPGX2
Influenza A by PCR: NEGATIVE
Influenza B by PCR: NEGATIVE
SARS Coronavirus 2 by RT PCR: NEGATIVE

## 2021-01-31 LAB — GLUCOSE, CAPILLARY
Glucose-Capillary: 157 mg/dL — ABNORMAL HIGH (ref 70–99)
Glucose-Capillary: 152 mg/dL — ABNORMAL HIGH (ref 70–99)
Glucose-Capillary: 120 mg/dL — ABNORMAL HIGH (ref 70–99)

## 2021-01-31 LAB — HEMOGLOBIN A1C
Hgb A1c MFr Bld: 6.6 % — ABNORMAL HIGH (ref 4.8–5.6)
Mean Plasma Glucose: 142.72 mg/dL

## 2021-01-31 SURGERY — CREATION, COLOSTOMY
Anesthesia: General

## 2021-01-31 MED ORDER — SODIUM CHLORIDE 0.9 % IR SOLN
Status: DC | PRN
  Administered 2021-01-31: 2000 mL

## 2021-01-31 MED ORDER — LACTATED RINGERS IV SOLN: INTRAVENOUS | Status: DC | PRN

## 2021-01-31 MED ORDER — CHLORHEXIDINE GLUCONATE CLOTH 2 % EX PADS: 6.0000 | MEDICATED_PAD | Freq: Once | CUTANEOUS | Status: DC

## 2021-01-31 MED ORDER — DEXAMETHASONE SODIUM PHOSPHATE 10 MG/ML IJ SOLN
INTRAMUSCULAR | Status: DC | PRN
  Administered 2021-01-31: 5 mg via INTRAVENOUS

## 2021-01-31 MED ORDER — LACTATED RINGERS IV SOLN: INTRAVENOUS | Status: DC

## 2021-01-31 MED ORDER — SUCCINYLCHOLINE CHLORIDE 200 MG/10ML IV SOSY
PREFILLED_SYRINGE | INTRAVENOUS | Status: DC | PRN
  Administered 2021-01-31: 180 mg via INTRAVENOUS

## 2021-01-31 MED ORDER — DIPHENHYDRAMINE HCL 50 MG/ML IJ SOLN: 12.5000 mg | Freq: Four times a day (QID) | INTRAMUSCULAR | Status: DC | PRN

## 2021-01-31 MED ORDER — BUPIVACAINE LIPOSOME 1.3 % IJ SUSP
INTRAMUSCULAR | Status: DC | PRN
  Administered 2021-01-31: 20 mL

## 2021-01-31 MED ORDER — ACETAMINOPHEN 500 MG PO TABS: 1000.0000 mg | ORAL_TABLET | ORAL | Status: DC

## 2021-01-31 MED ORDER — HYDROMORPHONE HCL 1 MG/ML IJ SOLN
0.2500 mg | INTRAMUSCULAR | Status: DC | PRN
  Administered 2021-01-31 (×4): 0.5 mg via INTRAVENOUS
  Filled 2021-01-31 (×4): qty 0.5

## 2021-01-31 MED ORDER — ONDANSETRON HCL 4 MG/2ML IJ SOLN
INTRAMUSCULAR | Status: AC
  Filled 2021-01-31: qty 2

## 2021-01-31 MED ORDER — ONDANSETRON HCL 4 MG/2ML IJ SOLN
INTRAMUSCULAR | Status: DC | PRN
  Administered 2021-01-31: 4 mg via INTRAVENOUS

## 2021-01-31 MED ORDER — SUGAMMADEX SODIUM 500 MG/5ML IV SOLN
INTRAVENOUS | Status: DC | PRN
  Administered 2021-01-31: 200 mg via INTRAVENOUS

## 2021-01-31 MED ORDER — PROPOFOL 10 MG/ML IV BOLUS
INTRAVENOUS | Status: DC | PRN
  Administered 2021-01-31: 200 mg via INTRAVENOUS

## 2021-01-31 MED ORDER — DEXAMETHASONE SODIUM PHOSPHATE 10 MG/ML IJ SOLN
INTRAMUSCULAR | Status: AC
  Filled 2021-01-31: qty 1

## 2021-01-31 MED ORDER — INSULIN ASPART 100 UNIT/ML ~~LOC~~ SOLN
0.0000 [IU] | SUBCUTANEOUS | Status: DC
  Administered 2021-01-31: 4 [IU] via SUBCUTANEOUS
  Administered 2021-02-01 – 2021-02-04 (×6): 3 [IU] via SUBCUTANEOUS
  Administered 2021-02-04: 4 [IU] via SUBCUTANEOUS
  Administered 2021-02-05: 3 [IU] via SUBCUTANEOUS

## 2021-01-31 MED ORDER — SIMETHICONE 80 MG PO CHEW: 40.0000 mg | CHEWABLE_TABLET | Freq: Four times a day (QID) | ORAL | Status: DC | PRN

## 2021-01-31 MED ORDER — HEPARIN SODIUM (PORCINE) 5000 UNIT/ML IJ SOLN
5000.0000 [IU] | Freq: Once | INTRAMUSCULAR | Status: AC
  Administered 2021-01-31: 5000 [IU] via SUBCUTANEOUS
  Filled 2021-01-31: qty 1

## 2021-01-31 MED ORDER — METRONIDAZOLE IN NACL 5-0.79 MG/ML-% IV SOLN
500.0000 mg | INTRAVENOUS | Status: AC
  Administered 2021-01-31: 500 mg via INTRAVENOUS
  Filled 2021-01-31: qty 100

## 2021-01-31 MED ORDER — FENTANYL CITRATE (PF) 100 MCG/2ML IJ SOLN
INTRAMUSCULAR | Status: DC | PRN
  Administered 2021-01-31: 50 ug via INTRAVENOUS
  Administered 2021-01-31: 150 ug via INTRAVENOUS

## 2021-01-31 MED ORDER — CIPROFLOXACIN IN D5W 400 MG/200ML IV SOLN
400.0000 mg | INTRAVENOUS | Status: AC
  Administered 2021-01-31: 400 mg via INTRAVENOUS
  Filled 2021-01-31: qty 200

## 2021-01-31 MED ORDER — KETOROLAC TROMETHAMINE 15 MG/ML IJ SOLN
15.0000 mg | Freq: Four times a day (QID) | INTRAMUSCULAR | Status: DC | PRN
  Administered 2021-01-31: 15 mg via INTRAVENOUS
  Filled 2021-01-31: qty 1

## 2021-01-31 MED ORDER — PROPOFOL 10 MG/ML IV BOLUS
INTRAVENOUS | Status: AC
  Filled 2021-01-31: qty 20

## 2021-01-31 MED ORDER — SUCCINYLCHOLINE CHLORIDE 200 MG/10ML IV SOSY
PREFILLED_SYRINGE | INTRAVENOUS | Status: AC
  Filled 2021-01-31: qty 10

## 2021-01-31 MED ORDER — MORPHINE SULFATE (PF) 2 MG/ML IV SOLN
2.0000 mg | INTRAVENOUS | Status: DC | PRN
  Administered 2021-01-31: 2 mg via INTRAVENOUS
  Filled 2021-01-31: qty 1

## 2021-01-31 MED ORDER — SODIUM CHLORIDE 0.9 % IV BOLUS
1000.0000 mL | Freq: Once | INTRAVENOUS | Status: AC
  Administered 2021-01-31: 1000 mL via INTRAVENOUS

## 2021-01-31 MED ORDER — DIPHENHYDRAMINE HCL 12.5 MG/5ML PO ELIX: 12.5000 mg | ORAL_SOLUTION | Freq: Four times a day (QID) | ORAL | Status: DC | PRN

## 2021-01-31 MED ORDER — HEPARIN SODIUM (PORCINE) 5000 UNIT/ML IJ SOLN
5000.0000 [IU] | Freq: Three times a day (TID) | INTRAMUSCULAR | Status: DC
  Administered 2021-01-31 – 2021-02-05 (×14): 5000 [IU] via SUBCUTANEOUS
  Filled 2021-01-31 (×14): qty 1

## 2021-01-31 MED ORDER — ROCURONIUM BROMIDE 10 MG/ML (PF) SYRINGE
PREFILLED_SYRINGE | INTRAVENOUS | Status: DC | PRN
  Administered 2021-01-31: 50 mg via INTRAVENOUS
  Administered 2021-01-31: 20 mg via INTRAVENOUS

## 2021-01-31 MED ORDER — ONDANSETRON 4 MG PO TBDP: 4.0000 mg | ORAL_TABLET | Freq: Four times a day (QID) | ORAL | Status: DC | PRN

## 2021-01-31 MED ORDER — BUPIVACAINE LIPOSOME 1.3 % IJ SUSP
INTRAMUSCULAR | Status: AC
  Filled 2021-01-31: qty 20

## 2021-01-31 MED ORDER — IOHEXOL 300 MG/ML  SOLN
100.0000 mL | Freq: Once | INTRAMUSCULAR | Status: AC | PRN
  Administered 2021-01-31: 100 mL via INTRAVENOUS

## 2021-01-31 MED ORDER — METOPROLOL TARTRATE 5 MG/5ML IV SOLN
2.5000 mg | Freq: Four times a day (QID) | INTRAVENOUS | Status: DC
  Administered 2021-02-01 – 2021-02-03 (×8): 2.5 mg via INTRAVENOUS
  Filled 2021-01-31 (×9): qty 5

## 2021-01-31 MED ORDER — PHENYLEPHRINE HCL (PRESSORS) 10 MG/ML IV SOLN
INTRAVENOUS | Status: DC | PRN
  Administered 2021-01-31 (×2): 80 ug via INTRAVENOUS
  Administered 2021-01-31 (×2): 120 ug via INTRAVENOUS

## 2021-01-31 MED ORDER — ROCURONIUM BROMIDE 10 MG/ML (PF) SYRINGE
PREFILLED_SYRINGE | INTRAVENOUS | Status: AC
  Filled 2021-01-31: qty 10

## 2021-01-31 MED ORDER — HYDROMORPHONE HCL 1 MG/ML IJ SOLN
1.0000 mg | INTRAMUSCULAR | Status: DC | PRN
  Administered 2021-01-31 – 2021-02-03 (×14): 1 mg via INTRAVENOUS
  Filled 2021-01-31 (×14): qty 1

## 2021-01-31 MED ORDER — LORAZEPAM 2 MG/ML IJ SOLN
0.5000 mg | INTRAMUSCULAR | Status: DC | PRN
  Administered 2021-01-31 – 2021-02-01 (×2): 0.5 mg via INTRAVENOUS
  Filled 2021-01-31 (×2): qty 1

## 2021-01-31 MED ORDER — PHENYLEPHRINE HCL-NACL 20-0.9 MG/250ML-% IV SOLN
INTRAVENOUS | Status: DC | PRN
  Administered 2021-01-31: 50 ug/min via INTRAVENOUS

## 2021-01-31 MED ORDER — ONDANSETRON HCL 4 MG/2ML IJ SOLN: 4.0000 mg | Freq: Once | INTRAMUSCULAR | Status: DC | PRN

## 2021-01-31 MED ORDER — METOPROLOL TARTRATE 5 MG/5ML IV SOLN: 5.0000 mg | Freq: Four times a day (QID) | INTRAVENOUS | Status: DC | PRN

## 2021-01-31 MED ORDER — SODIUM CHLORIDE 0.9 % IV SOLN: INTRAVENOUS | Status: DC

## 2021-01-31 MED ORDER — FENTANYL CITRATE (PF) 250 MCG/5ML IJ SOLN
INTRAMUSCULAR | Status: AC
  Filled 2021-01-31: qty 5

## 2021-01-31 MED ORDER — LIDOCAINE HCL (CARDIAC) PF 100 MG/5ML IV SOSY
PREFILLED_SYRINGE | INTRAVENOUS | Status: DC | PRN
  Administered 2021-01-31: 80 mg via INTRATRACHEAL

## 2021-01-31 MED ORDER — PHENYLEPHRINE HCL (PRESSORS) 10 MG/ML IV SOLN
INTRAVENOUS | Status: AC
  Filled 2021-01-31: qty 2

## 2021-01-31 MED ORDER — ONDANSETRON HCL 4 MG/2ML IJ SOLN: 4.0000 mg | Freq: Four times a day (QID) | INTRAMUSCULAR | Status: DC | PRN

## 2021-01-31 SURGICAL SUPPLY — 55 items
APL PRP STRL LF DISP 70% ISPRP (MISCELLANEOUS) ×1
APL SKNCLS STERI-STRIP NONHPOA (GAUZE/BANDAGES/DRESSINGS) ×1
BAG HAMPER (MISCELLANEOUS) ×2 IMPLANT
BARRIER SKIN 2 3/4 (OSTOMY) ×2 IMPLANT
BARRIER SKIN OD2.25 2 3/4 FLNG (OSTOMY) IMPLANT
BENZOIN TINCTURE PRP APPL 2/3 (GAUZE/BANDAGES/DRESSINGS) ×2 IMPLANT
BRR SKN FLT 2.75X2.25 2 PC (OSTOMY) ×1
CHLORAPREP W/TINT 26 (MISCELLANEOUS) ×2 IMPLANT
CLAMP POUCH DRAINAGE QUIET (OSTOMY) IMPLANT
CLOTH BEACON ORANGE TIMEOUT ST (SAFETY) ×2 IMPLANT
COVER LIGHT HANDLE STERIS (MISCELLANEOUS) ×4 IMPLANT
COVER WAND RF STERILE (DRAPES) ×2 IMPLANT
DRSG OPSITE POSTOP 4X10 (GAUZE/BANDAGES/DRESSINGS) ×1 IMPLANT
DRSG OPSITE POSTOP 4X8 (GAUZE/BANDAGES/DRESSINGS) IMPLANT
ELECT BLADE 6 FLAT ULTRCLN (ELECTRODE) ×2 IMPLANT
ELECT REM PT RETURN 9FT ADLT (ELECTROSURGICAL) ×2
ELECTRODE REM PT RTRN 9FT ADLT (ELECTROSURGICAL) ×1 IMPLANT
GAUZE 4X4 16PLY RFD (DISPOSABLE) ×1 IMPLANT
GLOVE SURG ENC MOIS LTX SZ6.5 (GLOVE) ×4 IMPLANT
GLOVE SURG SS PI 7.5 STRL IVOR (GLOVE) IMPLANT
GLOVE SURG UNDER POLY LF SZ6.5 (GLOVE) ×4 IMPLANT
GLOVE SURG UNDER POLY LF SZ7 (GLOVE) ×12 IMPLANT
GOWN STRL REUS W/TWL LRG LVL3 (GOWN DISPOSABLE) ×12 IMPLANT
HANDLE SUCTION POOLE (INSTRUMENTS) IMPLANT
INST SET MAJOR GENERAL (KITS) ×2 IMPLANT
KIT BLADEGUARD II DBL (SET/KITS/TRAYS/PACK) ×2 IMPLANT
KIT TURNOVER KIT A (KITS) ×2 IMPLANT
LIGASURE IMPACT 36 18CM CVD LR (INSTRUMENTS) ×2 IMPLANT
MANIFOLD NEPTUNE II (INSTRUMENTS) ×2 IMPLANT
NDL HYPO 18GX1.5 BLUNT FILL (NEEDLE) ×1 IMPLANT
NDL HYPO 21X1.5 SAFETY (NEEDLE) ×1 IMPLANT
NEEDLE HYPO 18GX1.5 BLUNT FILL (NEEDLE) ×2 IMPLANT
NEEDLE HYPO 21X1.5 SAFETY (NEEDLE) ×2 IMPLANT
NS IRRIG 1000ML POUR BTL (IV SOLUTION) ×4 IMPLANT
PACK COLON (CUSTOM PROCEDURE TRAY) ×2 IMPLANT
PAD ARMBOARD 7.5X6 YLW CONV (MISCELLANEOUS) ×2 IMPLANT
PENCIL SMOKE EVACUATOR COATED (MISCELLANEOUS) ×2 IMPLANT
POUCH OSTOMY 2 3/4  H 3804 (WOUND CARE) ×2
POUCH OSTOMY 2 3/4 H 3804 (WOUND CARE) ×1
POUCH OSTOMY 2 PC DRNBL 2.75 (WOUND CARE) IMPLANT
RETRACTOR WND ALEXIS 25 LRG (MISCELLANEOUS) IMPLANT
RTRCTR WOUND ALEXIS 25CM LRG (MISCELLANEOUS) ×2
SPONGE LAP 18X18 RF (DISPOSABLE) ×2 IMPLANT
STAPLER CUT CVD 40MM GREEN (STAPLE) ×1 IMPLANT
STAPLER GUN LINEAR PROX 60 (STAPLE) ×2 IMPLANT
STAPLER PROXIMATE 75MM BLUE (STAPLE) IMPLANT
STAPLER VISISTAT (STAPLE) ×2 IMPLANT
SUCTION POOLE HANDLE (INSTRUMENTS) ×2
SUT CHROMIC 3 0 SH 27 (SUTURE) ×2 IMPLANT
SUT PDS AB CT VIOLET #0 27IN (SUTURE) ×4 IMPLANT
SUT PROLENE 2 0 SH 30 (SUTURE) ×1 IMPLANT
SUT SILK 3 0 SH CR/8 (SUTURE) ×2 IMPLANT
SYR 20ML LL LF (SYRINGE) ×2 IMPLANT
TRAY FOLEY MTR SLVR 16FR STAT (SET/KITS/TRAYS/PACK) ×2 IMPLANT
YANKAUER SUCT BULB TIP 10FT TU (MISCELLANEOUS) ×2 IMPLANT

## 2021-01-31 NOTE — Anesthesia Procedure Notes (Addendum)
Procedure Name: Intubation Date/Time: 01/31/2021 1:21 PM Performed by: Karna Dupes, CRNA Pre-anesthesia Checklist: Patient identified, Emergency Drugs available, Suction available and Patient being monitored Patient Re-evaluated:Patient Re-evaluated prior to induction Oxygen Delivery Method: Circle system utilized Preoxygenation: Pre-oxygenation with 100% oxygen Induction Type: IV induction, Rapid sequence and Cricoid Pressure applied Laryngoscope Size: Mac and 4 Grade View: Grade I Tube type: Oral Tube size: 7.5 mm Number of attempts: 1 Airway Equipment and Method: Stylet Placement Confirmation: ETT inserted through vocal cords under direct vision,  positive ETCO2 and breath sounds checked- equal and bilateral Secured at: 21 cm Tube secured with: Tape Dental Injury: Teeth and Oropharynx as per pre-operative assessment

## 2021-01-31 NOTE — Anesthesia Postprocedure Evaluation (Signed)
Anesthesia Post Note  Patient: Francisco Allen  Procedure(s) Performed: COLOSTOMY (N/A ) PARTIAL COLECTOMY (N/A )  Patient location during evaluation: PACU Anesthesia Type: General Level of consciousness: awake Pain management: pain level controlled Vital Signs Assessment: post-procedure vital signs reviewed and stable Respiratory status: spontaneous breathing and respiratory function stable Cardiovascular status: blood pressure returned to baseline and stable Postop Assessment: no headache and no apparent nausea or vomiting Anesthetic complications: no   No complications documented.   Last Vitals:  Vitals:   01/31/21 1222 01/31/21 1258  BP: 102/69 107/73  Pulse: 94 89  Resp: (!) 27   Temp:  36.6 C  SpO2: 96% 94%    Last Pain:  Vitals:   01/31/21 1258  TempSrc: Oral  PainSc: [place]

## 2021-01-31 NOTE — Progress Notes (Signed)
[facility]  Discussed surgery with wife. Colostomy in place. NG in place and foley overnight. Dr. Arnoldo Morale will see in AM. PRN meds for pain.  Curlene Labrum, MD [facility] [address] [place], [place] [address] [phone] (office)

## 2021-01-31 NOTE — Anesthesia Preprocedure Evaluation (Signed)
Anesthesia Evaluation  Patient identified by MRN, date of birth, ID band Patient awake    Reviewed: Allergy & Precautions, H&P , NPO status , Patient's Chart, lab work & pertinent test results, reviewed documented beta blocker date and time   Airway Mallampati: II  TM Distance: >3 FB Neck ROM: full    Dental no notable dental hx. (+) Teeth Intact   Pulmonary neg pulmonary ROS, Current Smoker,    Pulmonary exam normal breath sounds clear to auscultation       Cardiovascular Exercise Tolerance: Good negative cardio ROS   Rhythm:regular Rate:Normal     Neuro/Psych  Headaches, PSYCHIATRIC DISORDERS Anxiety    GI/Hepatic Neg liver ROS, GERD  Medicated,  Endo/Other  diabetes, Type 2  Renal/GU negative Renal ROS  negative genitourinary   Musculoskeletal   Abdominal   Peds  Hematology negative hematology ROS (+)   Anesthesia Other Findings   Reproductive/Obstetrics negative OB ROS                             Anesthesia Physical Anesthesia Plan  ASA: IV and emergent  Anesthesia Plan: General   Post-op Pain Management:    Induction: Rapid sequence and Intravenous  PONV Risk Score and Plan: Ondansetron  Airway Management Planned: Oral ETT  Additional Equipment:   Intra-op Plan:   Post-operative Plan:   Informed Consent: I have reviewed the patients History and Physical, chart, labs and discussed the procedure including the risks, benefits and alternatives for the proposed anesthesia with the patient or authorized representative who has indicated his/her understanding and acceptance.     Dental Advisory Given  Plan Discussed with: CRNA  Anesthesia Plan Comments: (Pt not fully NPO, but entirely obstructed, so NPO guidelines irrelevant. Proceed with assumption of full stomach secondary to oral intake and obstruction Emergent procedure. Known CAD - medically managed. Recent  successful anesthetics.)        Anesthesia Quick Evaluation

## 2021-01-31 NOTE — ED Notes (Signed)
Pt in CT unable to get vitals.

## 2021-01-31 NOTE — ED Provider Notes (Signed)
[facility] EMERGENCY DEPARTMENT Provider Note   CSN: [id number] Arrival date & time: 01/31/21  7262     History Chief Complaint  Patient presents with  . Abdominal Pain    Francisco Allen is a 60 y.o. male.  Patient known to have a significant sigmoid narrowing resulting in some colonic obstruction.  Followed by our surgeon Dr. Constance Haw.  Patient was scheduled for surgery on March 30 for colectomy.  They feel that the stenosis is may be secondary to frequent episodes of diverticulitis.  Patient states he has not had a bowel movement last 3 days but has had some diarrhea.  No vomiting last had anything to eat or drink at 630 this morning.  States his belly is distended.  And is having discomfort.  Past medical history significant for hyperlipidemia diverticulitis prediabetic and gastroesophageal reflux disease.        Past Medical History:  Diagnosis Date  . Arthritis    "all over my body I think" (09/23/2017)  . Colon polyp   . Diverticulitis   . GERD (gastroesophageal reflux disease)    hx  . Hyperlipidemia   . Pre-diabetes     Patient Active Problem List   Diagnosis Date Noted  . Stenosis colon ([place]) 01/28/2021  . Hip pain, right 02/28/2020  . HTN (hypertension) 09/23/2017  . Tobacco use 09/23/2017  . Hematochezia 04/22/2017  . Controlled type 2 diabetes mellitus with microalbuminuria ([place]) 02/09/2017  . Diverticulitis of colon 10/21/2015  . Morbid obesity ([place]) 05/14/2015  . Migraines 04/09/2014  . GAD (generalized anxiety disorder) 04/04/2013  . Hyperlipidemia 04/04/2013    Past Surgical History:  Procedure Laterality Date  . ARTHROSCOPY KNEE W/ DRILLING Left 10/25/2020  . COLONOSCOPY  X ~ 2   "brother passed away w/colon cancer; I've probably had 4 colonoscopies done since he passed" (09/23/2017)  . COLONOSCOPY W/ BIOPSIES AND POLYPECTOMY  X 2  . CYST EXCISION  2004   [place] of mouth  . HEMORRHOID SURGERY     "lanced"  . LEFT HEART CATH AND CORONARY  ANGIOGRAPHY N/A 09/24/2017   Procedure: LEFT HEART CATH AND CORONARY ANGIOGRAPHY;  Surgeon: Jettie Booze, MD;  Location: [facility];  Service: Cardiovascular;  Laterality: N/A;  . SHOULDER ARTHROSCOPY WITH ROTATOR CUFF REPAIR Right        Family History  Problem Relation Age of Onset  . Colon cancer Brother   . Dementia Mother   . CVA Father   . Sudden death Sister 70       in her sleep, was told a heart attack  . Heart disease Brother        CABG  . Heart disease Brother        CABG  . Heart disease Brother        CABG    Social History   Tobacco Use  . Smoking status: Current Every Day Smoker    Packs/day: 1.00    Years: 34.00    Pack years: 34.00    Types: Cigarettes  . Smokeless tobacco: Never Used  Vaping Use  . Vaping Use: Never used  Substance Use Topics  . Alcohol use: No  . Drug use: No    Home Medications Prior to Admission medications   Medication Sig Start Date End Date Taking? Authorizing Provider  ALPRAZolam [name]) 1 MG tablet Take 1 tablet (1 mg total) by mouth 3 (three) times daily as needed for anxiety. 08/28/20  Yes Dettinger, Fransisca Kaufmann, MD  cholecalciferol (VITAMIN D3) 25 MCG (1000 UNIT) tablet Take 1,000 Units by mouth daily.   Yes [provider]  lisinopril (ZESTRIL) 10 MG tablet TAKE 1 TABLET DAILY Patient taking differently: Take 10 mg by mouth daily. 10/28/20  Yes Dettinger, Fransisca Kaufmann, MD  metFORMIN (GLUCOPHAGE) 500 MG tablet TAKE  (1)  TABLET TWICE A DAY WITH MEALS (BREAKFAST AND SUPPER) Patient taking differently: Take 500 mg by mouth 2 (two) times daily with a meal. 10/28/20  Yes Dettinger, Fransisca Kaufmann, MD  metoprolol tartrate (LOPRESSOR) 25 MG tablet TAKE(1)  TABLET TWICE A DAY. Patient taking differently: Take 25 mg by mouth 2 (two) times daily. 10/28/20  Yes Dettinger, Fransisca Kaufmann, MD  Multiple Vitamins-Minerals (MENS 50+ MULTI VITAMIN/MIN PO) Take 1 tablet by mouth daily.   Yes [provider]  Omega-3 Fatty  Acids (FISH OIL) 1000 MG CAPS Take 1,000 mg by mouth daily.   Yes [provider]  rosuvastatin (CRESTOR) 40 MG tablet TAKE 1 TABLET DAILY Patient taking differently: Take 40 mg by mouth daily. 10/28/20  Yes Dettinger, Fransisca Kaufmann, MD  clobetasol (TEMOVATE) 0.05 % external solution Apply 1 application topically 2 (two) times daily. Patient not taking: No sig reported 10/27/17   Dettinger, Fransisca Kaufmann, MD  ibuprofen (ADVIL) 800 MG tablet Take 1 tablet (800 mg total) by mouth every 8 (eight) hours as needed. Patient not taking: No sig reported 04/11/20   Dettinger, Fransisca Kaufmann, MD  metroNIDAZOLE (FLAGYL) 500 MG tablet Take 2 tablets (1,000 mg total) by mouth as directed. Take 2 Flagyl 558m tablets at 2 pm, 3pm and 10 pm. 01/28/21   BVirl Cagey MD  neomycin (MYCIFRADIN) 500 MG tablet Take 2 tablets (1,000 mg total) by mouth as directed. Take 2 neomycin 5062mtablets at 2 pm, 3pm and 10 pm. 01/28/21   BrVirl CageyMD  SUPREP BOWEL PREP KIT 17.5-3.13-1.6 GM/177ML SOLN Take 1 kit by mouth as directed. 01/27/21   [provider]    Allergies    Bee venom and Penicillins  Review of Systems   Review of Systems  Constitutional: Negative for chills and fever.  HENT: Negative for rhinorrhea and sore throat.   Eyes: Negative for visual disturbance.  Respiratory: Negative for cough and shortness of breath.   Cardiovascular: Negative for chest pain and leg swelling.  Gastrointestinal: Positive for abdominal pain and constipation. Negative for diarrhea, nausea and vomiting.  Genitourinary: Negative for dysuria.  Musculoskeletal: Negative for back pain and neck pain.  Skin: Negative for rash.  Neurological: Negative for dizziness, light-headedness and headaches.  Hematological: Does not bruise/bleed easily.  Psychiatric/Behavioral: Negative for confusion.    Physical Exam Updated Vital Signs BP 111/76   Pulse 94   Temp 98.7 F (37.1 C) (Oral)   Resp 18   Ht 1.753 m (5' 9" )    Wt 105.2 kg   SpO2 95%   BMI 34.26 kg/m   Physical Exam Vitals and nursing note reviewed.  Constitutional:      General: He is not in acute distress.    Appearance: Normal appearance. He is well-developed.  HENT:     Head: Normocephalic and atraumatic.  Eyes:     Conjunctiva/sclera: Conjunctivae normal.  Cardiovascular:     Rate and Rhythm: Normal rate and regular rhythm.     Heart sounds: No murmur heard.   Pulmonary:     Effort: Pulmonary effort is normal. No respiratory distress.     Breath sounds: Normal breath sounds.  Abdominal:  General: There is distension.     Palpations: Abdomen is soft.     Tenderness: There is no abdominal tenderness.  Musculoskeletal:        General: Normal range of motion.     Cervical back: Neck supple.  Skin:    General: Skin is warm and dry.     Capillary Refill: Capillary refill takes less than 2 seconds.  Neurological:     General: No focal deficit present.     Mental Status: He is alert and oriented to person, place, and time.     Cranial Nerves: No cranial nerve deficit.     Sensory: No sensory deficit.     Motor: No weakness.     ED Results / Procedures / Treatments   Labs (all labs ordered are listed, but only abnormal results are displayed) Labs Reviewed  CBC WITH DIFFERENTIAL/PLATELET - Abnormal; Notable for the following components:      Result Value   WBC 17.5 (*)    RBC 5.88 (*)    Hemoglobin 17.1 (*)    Neutro Abs 14.0 (*)    Monocytes Absolute 1.2 (*)    All other components within normal limits  COMPREHENSIVE METABOLIC PANEL - Abnormal; Notable for the following components:   Sodium 131 (*)    Chloride 97 (*)    Glucose, Bld 145 (*)    Calcium 8.4 (*)    All other components within normal limits  RESP PANEL BY RT-PCR (FLU A&B, COVID) ARPGX2  LIPASE, BLOOD    EKG None  Radiology CT Abdomen Pelvis W Contrast  Result Date: 01/31/2021 CLINICAL DATA:  Worsening abdominal pain, increased distension,  history of chronic sigmoid stenosis related to recurrent bouts of diverticulitis. EXAM: CT ABDOMEN AND PELVIS WITH CONTRAST TECHNIQUE: Multidetector CT imaging of the abdomen and pelvis was performed using the standard protocol following bolus administration of intravenous contrast. CONTRAST:  17m OMNIPAQUE IOHEXOL 300 MG/ML  SOLN COMPARISON:  01/24/2021 FINDINGS: Lower chest: Similar minor lingula scarring otherwise clear lung bases. Normal heart size. No pericardial or pleural effusion. Hepatobiliary: No focal hepatic abnormality. There are a few scattered punctate calcified granulomata noted. No biliary dilatation. Patent hepatic and portal veins. Gallbladder unremarkable. Common bile duct nondilated. Pancreas: Unremarkable. No pancreatic ductal dilatation or surrounding inflammatory changes. Spleen: Normal in size without focal abnormality. Adrenals/Urinary Tract: Normal adrenal glands. Bilateral renal cysts again noted. No renal obstruction or hydronephrosis. No hydroureter or ureteral calculus. Urinary bladder under distended. Stomach/Bowel: Stomach and duodenum are collapsed. However, there is progressive distension throughout the small bowel and marked distension of the colon with air-fluid levels. This extends to the pelvis where the distal sigmoid colon has a similar thick-walled narrowed segment which is compatible with a bowel stenosis but difficult to exclude underlying transmural constricting lesion. Appearance compatible with worsening distal colonic obstruction. Scattered colonic diverticula noted. No definite acute inflammatory process or diverticulitis. No evidence of perforation, free air, significant free fluid, fluid collection, abscess or ascites. Vascular/Lymphatic: Aortoiliac atherosclerosis without occlusive process, aneurysm or acute vascular finding. Mesenteric and renal vasculature appear to remain patent. No veno-occlusive disease. No bulky adenopathy. Reproductive: Prostate gland is  mildly enlarged. Other: Similar small fat containing inguinal hernias. Small fat containing umbilical hernia. No large hernia appreciated. Musculoskeletal: Degenerative changes of the spine and lower lumbar facet arthropathy. No acute osseous finding. Similar chronic L4 on L5 grade 1 anterolisthesis. IMPRESSION: Worsening distal colonic obstruction pattern with increased fluid distension of the small bowel and the colon with  scattered air-fluid levels extending to a similar area of lower sigmoid luminal narrowing/wall thickening which would be compatible with a chronic bowel stenosis but difficult to exclude underlying transmural constricting lesion. No evidence of perforation or abscess. No area of acute diverticulitis appreciated. Aortic Atherosclerosis (ICD10-I70.0). Electronically Signed   By: Jerilynn Mages.  Shick M.D.   On: 01/31/2021 11:07    Procedures Procedures   Medications Ordered in ED Medications  0.9 %  sodium chloride infusion (has no administration in time range)  sodium chloride 0.9 % bolus 1,000 mL (has no administration in time range)  iohexol (OMNIPAQUE) 300 MG/ML solution 100 mL (100 mLs Intravenous Contrast Given 01/31/21 1022)    ED Course  I have reviewed the triage vital signs and the nursing notes.  Pertinent labs & imaging results that were available during my care of the patient were reviewed by me and considered in my medical decision making (see chart for details).    MDM Rules/Calculators/A&P                          CT scan consistent with colonic obstruction.  Also with some air-fluid levels.  Patient amazingly not having any nausea and vomiting.  Dr. Constance Haw his surgeon is planning to come in and see him and planning to probably do colonoscopy today.  We will get IV fluids going for her patient last NPO at 6:30 in the morning.  We will also get EKG.  And get rapid Covid test.   Final Clinical Impression(s) / ED Diagnoses Final diagnoses:  Colonic obstruction [facility])     Rx / DC Orders ED Discharge Orders    None       Fredia Sorrow, MD 01/31/21 1125

## 2021-01-31 NOTE — Plan of Care (Signed)

## 2021-01-31 NOTE — Op Note (Signed)
[facility] Operative Note  01/31/21  Preoperative Diagnosis:  Large bowel obstruction, Sigmoid Colon Stenosis    Postoperative Diagnosis: Same   Procedure(s) Performed:  Partial colectomy and end colostomy (Hartman's procedure)    Surgeon: Francisco Matar. Constance Haw, MD   Assistants:  Francisco Signs, MD    Anesthesia: General endotracheal   Anesthesiologist: Francisco Sjogren, MD    Specimens:  Sigmoid colon (suture proximal)    Estimated Blood Loss: Minimal   Blood Replacement: None    Complications: None   Wound Class: Contaminated    Operative Indications:  Francisco Allen is a 60 yo with a sigmoid stenosis on colonoscopy noted back in 2018. He has since started to have constipation and nausea and is now presenting with obstipation and Allen of obstruction. We had attempted to do a bowel preparation and liquid diet to allow for an anastomosis, but given the worsening obstructive symptoms and inability to prep we opted for an urgent partial colectomy with end colostomy. We discussed the risk of bleeding, infection, colostomy, possibility of cancer, need to keep colostomy for a few months before reversing.    Findings: Sigmoid stricture with narrowing smaller than 1cm, wall thickened but no obvious mucosal abnormality or mass    Procedure: The patient was taken to the operating room and placed supine. General endotracheal anesthesia was induced. Intravenous antibiotics were  administered per protocol.  An orogastric tube positioned to decompress the stomach. A foley was placed. The abdomen was prepared and draped in the usual sterile fashion.   A midline incision was made and carried down through to the fascia which was opened with care with scissors.   The small bowel was very dilated had to be decompressed to allow for better access into the abdomen. The contents of the small bowel were milked back carefully into the stomach where the OG was confirmed.  Thick brown output was  noted in the OG.  The small bowel was packed into the right upper quadrant with tapes.   From here the sigmoid and descending colon were noted to be very dilated down into the pelvis where the peritoneal reflection anteriorly was adherent to stenotic portion of sigmoid colon from prior diverticulitis episodes. With care, the anterior peritoneal reflection was dissected off the sigmoid colon and the inflammatory rind was cleared between the colon and pelvic wall.  This freed up the strictured area which was looping into the pelvis.  The white line was taken down with cautery on the descending colon and down into the pelvic brim where care was taken and the ureter was identified and avoided on the left side.  The colon was then mobile and soft healthy rectosigmoid could be appreciated. A contour TA stapler was used to make the distal point of resection.  From here a Ligasure was used to take the mesentery ensuring that the ureters were clear and protected.  The proximal point of transection was identified and taken with a 75 mm linear cutting stapler. The white line was taken up to the splenic flexure, and the colon eviscerated easily out to the abdominal wall.   An NG was exchanged for the OG and confirmation was made. The abdomen was irrigated with warm saline. Hemostasis was confirmed. A 2-0 Prolene tag was placed on the rectal stump. The small bowel was unpacked and all tapes were removed.  An circular incision was made in the skin in the left abdomen and carried down through to the fascia where a cruciate  incision was made. The muscle was divided and the proximal colon was brought out through the defect that was well over 2 fingerbreaths.    The colon appeared pink and healthy except for right at the staple line where there was some ischemia noted.  This was covered.  The fascia was closed in the standard fashion using 0 PDS suture. The wound was irrigated and closed with staples and covered with a  honeycomb dressing.   The ostomy was opened excising the distal ischemic edge and matured in the standard fashion with 2-0 Chromic gut suture. The ostomy was patent with pink viable mucosa and some hemorrhagic trauma to the mucosa edges.  A wafer and bag were placed.   Francisco Allen was assisting throughout the procedure and was present for the critical portions of the case.   Final inspection revealed acceptable hemostasis. All counts were correct at the end of the case. The patient was awakened from anesthesia and extubated without complication.  The patient went to the PACU in stable condition.   Francisco Labrum, MD [facility] [address] [place], [place] [id number] [phone] (office)

## 2021-01-31 NOTE — ED Triage Notes (Signed)
Pt c/o of abdominal pain with increasing pain to the left lower side. Pt saw Dr. Constance Haw on Tuesday and stating he was scheduled for surgery for next week. Was told he has bowel stenosis and has a blockage.  LBM was 3 days ago but was diarrhea

## 2021-01-31 NOTE — Transfer of Care (Signed)
Immediate Anesthesia Transfer of Care Note  Patient: Francisco Allen  Procedure(s) Performed: COLOSTOMY (N/A ) PARTIAL COLECTOMY (N/A )  Patient Location: PACU  Anesthesia Type:General  Level of Consciousness: awake  Airway & Oxygen Therapy: Patient Spontanous Breathing and Patient connected to nasal cannula oxygen  Post-op Assessment: Report given to RN, Post -op Vital signs reviewed and stable and Patient moving all extremities  Post vital signs: Reviewed and stable  Last Vitals:  Vitals Value Taken Time  BP 83/50 01/31/21 1532  Temp    Pulse 78 01/31/21 1538  Resp 16 01/31/21 1538  SpO2 97 % 01/31/21 1538  Vitals shown include unvalidated device data.  Last Pain:  Vitals:   01/31/21 1258  TempSrc: Oral  PainSc: 7       Patients Stated Pain Goal: 5 (44/96/75 9163)  Complications: No complications documented.

## 2021-01-31 NOTE — H&P (Signed)
[facility] History and Physical  Reason for Referral: Colonic stenosis, obstruction  Referring Physician:  ED Dr. Rogene Houston   Chief Complaint    Abdominal Pain      Francisco Allen is a 60 y.o. male.  HPI: Francisco Allen is a 60 yo known to me with a sigmoid stenosis that saw me this week. We were attempting to get him bowel prepped over the next week with a liquid diet, miralax, and a 2 day bowel prep. Unfortunately he is obstructed now and has not had a BM in 3 days. He has some nausea and dry heaves. He says that he is more distended and the abdomen is painful. He is here with his wife.   Past Medical History:  Diagnosis Date  . Arthritis    "all over my body I think" (09/23/2017)  . Colon polyp   . Diverticulitis   . GERD (gastroesophageal reflux disease)    hx  . Hyperlipidemia   . Pre-diabetes     Past Surgical History:  Procedure Laterality Date  . ARTHROSCOPY KNEE W/ DRILLING Left 10/25/2020  . COLONOSCOPY  X ~ 2   "brother passed away w/colon cancer; I've probably had 4 colonoscopies done since he passed" (09/23/2017)  . COLONOSCOPY W/ BIOPSIES AND POLYPECTOMY  X 2  . CYST EXCISION  2004   [place] of mouth  . HEMORRHOID SURGERY     "lanced"  . LEFT HEART CATH AND CORONARY ANGIOGRAPHY N/A 09/24/2017   Procedure: LEFT HEART CATH AND CORONARY ANGIOGRAPHY;  Surgeon: Jettie Booze, MD;  Location: [facility];  Service: Cardiovascular;  Laterality: N/A;  . SHOULDER ARTHROSCOPY WITH ROTATOR CUFF REPAIR Right     Family History  Problem Relation Age of Onset  . Colon cancer Brother   . Dementia Mother   . CVA Father   . Sudden death Sister 22       in her sleep, was told a heart attack  . Heart disease Brother        CABG  . Heart disease Brother        CABG  . Heart disease Brother        CABG    Social History   Tobacco Use  . Smoking status: Current Every Day Smoker    Packs/day: 1.00    Years: 34.00    Pack years: 34.00     Types: Cigarettes  . Smokeless tobacco: Never Used  Vaping Use  . Vaping Use: Never used  Substance Use Topics  . Alcohol use: No  . Drug use: No    Medications: I have reviewed the patient's current medications. Medications Prior to Admission  Medication Sig Dispense Refill  . ALPRAZolam (XANAX) 1 MG tablet Take 1 tablet (1 mg total) by mouth 3 (three) times daily as needed for anxiety. 90 tablet 2  . cholecalciferol (VITAMIN D3) 25 MCG (1000 UNIT) tablet Take 1,000 Units by mouth daily.    Marland Kitchen lisinopril (ZESTRIL) 10 MG tablet TAKE 1 TABLET DAILY (Patient taking differently: Take 10 mg by mouth daily.) 90 tablet 0  . metFORMIN (GLUCOPHAGE) 500 MG tablet TAKE  (1)  TABLET TWICE A DAY WITH MEALS (BREAKFAST AND SUPPER) (Patient taking differently: Take 500 mg by mouth 2 (two) times daily with a meal.) 180 tablet 0  . metoprolol tartrate (LOPRESSOR) 25 MG tablet TAKE(1)  TABLET TWICE A DAY. (Patient taking differently: Take 25 mg by mouth 2 (two) times daily.) 180 tablet 0  .  Multiple Vitamins-Minerals (MENS 50+ MULTI VITAMIN/MIN PO) Take 1 tablet by mouth daily.    . Omega-3 Fatty Acids (FISH OIL) 1000 MG CAPS Take 1,000 mg by mouth daily.    . rosuvastatin (CRESTOR) 40 MG tablet TAKE 1 TABLET DAILY (Patient taking differently: Take 40 mg by mouth daily.) 90 tablet 0  . clobetasol (TEMOVATE) 0.05 % external solution Apply 1 application topically 2 (two) times daily. (Patient not taking: No sig reported) 50 mL 6  . ibuprofen (ADVIL) 800 MG tablet Take 1 tablet (800 mg total) by mouth every 8 (eight) hours as needed. (Patient not taking: No sig reported) 30 tablet 0  . metroNIDAZOLE (FLAGYL) 500 MG tablet Take 2 tablets (1,000 mg total) by mouth as directed. Take 2 Flagyl 558m tablets at 2 pm, 3pm and 10 pm. 6 tablet 0  . neomycin (MYCIFRADIN) 500 MG tablet Take 2 tablets (1,000 mg total) by mouth as directed. Take 2 neomycin 5013mtablets at 2 pm, 3pm and 10 pm. 6 tablet 0  . SUPREP BOWEL PREP  KIT 17.5-3.13-1.6 GM/177ML SOLN Take 1 kit by mouth as directed.     Current Facility-Administered Medications  Medication Dose Route Frequency Provider Last Rate Last Admin  . 0.9 %  sodium chloride infusion   Intravenous Continuous ZaFredia SorrowMD 100 mL/hr at 01/31/21 1213 New Bag at 01/31/21 1213  . acetaminophen (TYLENOL) tablet 1,000 mg  1,000 mg Oral On Call to OR BrVirl CageyMD      . Chlorhexidine Gluconate Cloth 2 % PADS 6 each  6 each Topical Once BrVirl CageyMD       And  . Chlorhexidine Gluconate Cloth 2 % PADS 6 each  6 each Topical Once BrVirl CageyMD      . metroNIDAZOLE (FLAGYL) IVPB 500 mg  500 mg Intravenous On Call to ORDeltaMD 100 mL/hr at 01/31/21 1219 500 mg at 01/31/21 1219   And  . ciprofloxacin (CIPRO) IVPB 400 mg  400 mg Intravenous On Call to OR BrVirl CageyMD      . [MDoug Souold] LORazepam (ATIVAN) injection 0.5 mg  0.5 mg Intravenous Q4H PRN BrVirl CageyMD   0.5 mg at 01/31/21 1211   Allergies  Allergen Reactions  . Bee Venom Anaphylaxis, Swelling and Other (See Comments)    Anaphylactic shock  . Penicillins Anaphylaxis and Swelling    Airway closes up Has patient had a PCN reaction causing immediate rash, facial/tongue/throat swelling, SOB or lightheadedness with hypotension: Yes Has patient had a PCN reaction causing severe rash involving mucus membranes or skin necrosis: Yes Has patient had a PCN reaction that required hospitalization: YeAlfred Levinsas patient had a PCN reaction occurring within the last 10 years: No If all of the above answers are "NO", then may proceed with Cephalosporin use.    ROS:  A comprehensive review of systems was negative except for: Gastrointestinal: positive for abdominal pain, constipation and nausea  Blood pressure 111/76, pulse 94, temperature 98.7 F (37.1 C), temperature source Oral, resp. rate 18, height 5' 9" (1.753 m), weight 105.2 kg, SpO2 95 %. Physical Exam Vitals  reviewed.  Constitutional:      Appearance: He is obese.  HENT:     Head: Normocephalic.  Eyes:     Extraocular Movements: Extraocular movements intact.  Cardiovascular:     Rate and Rhythm: Regular rhythm.  Pulmonary:     Effort: Pulmonary effort is normal.  Abdominal:  General: There is distension.     Tenderness: There is abdominal tenderness. There is no guarding or rebound.  Musculoskeletal:     Comments: Moves all extremities  Neurological:     General: No focal deficit present.     Mental Status: He is alert and oriented to person, place, and time.  Psychiatric:        Mood and Affect: Mood normal.        Behavior: Behavior normal.     Results: Results for orders placed or performed during the hospital encounter of 01/31/21 (from the past 48 hour(s))  CBC with Differential/Platelet     Status: Abnormal   Collection Time: 01/31/21  8:41 AM  Result Value Ref Range   WBC 17.5 (H) 4.0 - 10.5 K/uL   RBC 5.88 (H) 4.22 - 5.81 MIL/uL   Hemoglobin 17.1 (H) 13.0 - 17.0 g/dL   HCT 51.0 39.0 - 52.0 %   MCV 86.7 80.0 - 100.0 fL   MCH 29.1 26.0 - 34.0 pg   MCHC 33.5 30.0 - 36.0 g/dL   RDW 13.7 11.5 - 15.5 %   Platelets 297 150 - 400 K/uL   nRBC 0.0 0.0 - 0.2 %   Neutrophils Relative % 73 %   Neutro Abs 14.0 (H) 1.7 - 7.7 K/uL   Band Neutrophils 7 %   Lymphocytes Relative 9 %   Lymphs Abs 1.6 0.7 - 4.0 K/uL   Monocytes Relative 7 %   Monocytes Absolute 1.2 (H) 0.1 - 1.0 K/uL   Eosinophils Relative 0 %   Eosinophils Absolute 0.0 0.0 - 0.5 K/uL   Basophils Relative 0 %   Basophils Absolute 0.0 0.0 - 0.1 K/uL   WBC Morphology DOHLE BODIES     Comment: MILD LEFT SHIFT (1-5% METAS, OCC MYELO, OCC BANDS)   Metamyelocytes Relative 4 %    Comment: Performed at [facility], [address], [place], [place] [address]  Comprehensive metabolic panel     Status: Abnormal   Collection Time: 01/31/21  8:41 AM  Result Value Ref Range   Sodium 131 (L) 135 - 145 mmol/L   Potassium  4.1 3.5 - 5.1 mmol/L   Chloride 97 (L) 98 - 111 mmol/L   CO2 22 22 - 32 mmol/L   Glucose, Bld 145 (H) 70 - 99 mg/dL    Comment: Glucose reference range applies only to samples taken after fasting for at least 8 hours.   BUN 15 8 - 23 mg/dL   Creatinine, Ser 1.11 0.61 - 1.24 mg/dL   Calcium 8.4 (L) 8.9 - 10.3 mg/dL   Total Protein 7.5 6.5 - 8.1 g/dL   Albumin 3.5 3.5 - 5.0 g/dL   AST 18 15 - 41 U/L   ALT 22 0 - 44 U/L   Alkaline Phosphatase 107 38 - 126 U/L   Total Bilirubin 0.9 0.3 - 1.2 mg/dL   GFR, Estimated >60 >60 mL/min    Comment: (NOTE) Calculated using the CKD-EPI Creatinine Equation (2021)    Anion gap 12 5 - 15    Comment: Performed at [facility], [address] [place], [place] [address]  Lipase, blood     Status: None   Collection Time: 01/31/21  8:41 AM  Result Value Ref Range   Lipase 38 11 - 51 U/L    Comment: Performed at [facility], [address], [place], [place] [address]   Personally reviewed- obstruction of the large bowel with stenosis of the sigmoid colon,  more distended colon and distended small bowel  CT Abdomen Pelvis W Contrast  Result Date: 01/31/2021 CLINICAL DATA:  Worsening abdominal pain, increased distension, history of chronic sigmoid stenosis related to recurrent bouts of diverticulitis. EXAM: CT ABDOMEN AND PELVIS WITH CONTRAST TECHNIQUE: Multidetector CT imaging of the abdomen and pelvis was performed using the standard protocol following bolus administration of intravenous contrast. CONTRAST:  15m OMNIPAQUE IOHEXOL 300 MG/ML  SOLN COMPARISON:  01/24/2021 FINDINGS: Lower chest: Similar minor lingula scarring otherwise clear lung bases. Normal heart size. No pericardial or pleural effusion. Hepatobiliary: No focal hepatic abnormality. There are a few scattered punctate calcified granulomata noted. No biliary dilatation. Patent hepatic and portal veins. Gallbladder unremarkable. Common bile duct nondilated. Pancreas: Unremarkable. No pancreatic  ductal dilatation or surrounding inflammatory changes. Spleen: Normal in size without focal abnormality. Adrenals/Urinary Tract: Normal adrenal glands. Bilateral renal cysts again noted. No renal obstruction or hydronephrosis. No hydroureter or ureteral calculus. Urinary bladder under distended. Stomach/Bowel: Stomach and duodenum are collapsed. However, there is progressive distension throughout the small bowel and marked distension of the colon with air-fluid levels. This extends to the pelvis where the distal sigmoid colon has a similar thick-walled narrowed segment which is compatible with a bowel stenosis but difficult to exclude underlying transmural constricting lesion. Appearance compatible with worsening distal colonic obstruction. Scattered colonic diverticula noted. No definite acute inflammatory process or diverticulitis. No evidence of perforation, free air, significant free fluid, fluid collection, abscess or ascites. Vascular/Lymphatic: Aortoiliac atherosclerosis without occlusive process, aneurysm or acute vascular finding. Mesenteric and renal vasculature appear to remain patent. No veno-occlusive disease. No bulky adenopathy. Reproductive: Prostate gland is mildly enlarged. Other: Similar small fat containing inguinal hernias. Small fat containing umbilical hernia. No large hernia appreciated. Musculoskeletal: Degenerative changes of the spine and lower lumbar facet arthropathy. No acute osseous finding. Similar chronic L4 on L5 grade 1 anterolisthesis. IMPRESSION: Worsening distal colonic obstruction pattern with increased fluid distension of the small bowel and the colon with scattered air-fluid levels extending to a similar area of lower sigmoid luminal narrowing/wall thickening which would be compatible with a chronic bowel stenosis but difficult to exclude underlying transmural constricting lesion. No evidence of perforation or abscess. No area of acute diverticulitis appreciated. Aortic  Atherosclerosis (ICD10-I70.0). Electronically Signed   By: MJerilynn Mages  Shick M.D.   On: 01/31/2021 11:07     Assessment & Plan:  MDACIAN ORRICOis a 60y.o. male with a sigmoid stenosis that is now essentially completely obstructed. We were hoping for a prolonged bowel prep to allow for anastomosis but this is not going happened. Discussed question of stent and bowel prep with Dr. JArnoldo Moraleand Dr. RLaural Goldenbut given the stenosis and degree of obstruction not sure how feasible that is at this time. [facility] the [facility]'s care further for what is likely the inevitable with a colostomy that should be temporary. Discussed risk of bleeding, infection, colostomy, colostomy care, possibility of packing the wound. Discussed possible need for blood and hospital course.   COVID negative All questions were answered to the satisfaction of the patient and family.     LVirl Cagey3/25/2022, 11:46 AM

## 2021-02-01 LAB — GLUCOSE, CAPILLARY
Glucose-Capillary: 101 mg/dL — ABNORMAL HIGH (ref 70–99)
Glucose-Capillary: 103 mg/dL — ABNORMAL HIGH (ref 70–99)
Glucose-Capillary: 111 mg/dL — ABNORMAL HIGH (ref 70–99)
Glucose-Capillary: 112 mg/dL — ABNORMAL HIGH (ref 70–99)
Glucose-Capillary: 124 mg/dL — ABNORMAL HIGH (ref 70–99)
Glucose-Capillary: 137 mg/dL — ABNORMAL HIGH (ref 70–99)

## 2021-02-01 LAB — PHOSPHORUS: Phosphorus: 5.4 mg/dL — ABNORMAL HIGH (ref 2.5–4.6)

## 2021-02-01 LAB — BASIC METABOLIC PANEL
Anion gap: 11 (ref 5–15)
BUN: 21 mg/dL (ref 8–23)
CO2: 18 mmol/L — ABNORMAL LOW (ref 22–32)
Calcium: 7.8 mg/dL — ABNORMAL LOW (ref 8.9–10.3)
Chloride: 104 mmol/L (ref 98–111)
Creatinine, Ser: 1.07 mg/dL (ref 0.61–1.24)
GFR, Estimated: >60 mL/min (ref >60)
Glucose, Bld: 112 mg/dL — ABNORMAL HIGH (ref 70–99)
Potassium: 4.6 mmol/L (ref 3.5–5.1)
Sodium: 133 mmol/L — ABNORMAL LOW (ref 135–145)

## 2021-02-01 LAB — CBC WITH DIFFERENTIAL/PLATELET
Abs Immature Granulocytes: 0.15 10*3/uL — ABNORMAL HIGH (ref 0.00–0.07)
Basophils Absolute: 0.2 10*3/uL — ABNORMAL HIGH (ref 0.0–0.1)
Basophils Relative: 1 %
Eosinophils Absolute: 0 10*3/uL (ref 0.0–0.5)
Eosinophils Relative: 0 %
HCT: 44.3 % (ref 39.0–52.0)
Hemoglobin: 14.9 g/dL (ref 13.0–17.0)
Immature Granulocytes: 1 %
Lymphocytes Relative: 7 %
Lymphs Abs: 1.2 10*3/uL (ref 0.7–4.0)
MCH: 29.4 pg (ref 26.0–34.0)
MCHC: 33.6 g/dL (ref 30.0–36.0)
MCV: 87.5 fL (ref 80.0–100.0)
Monocytes Absolute: 1.4 10*3/uL — ABNORMAL HIGH (ref 0.1–1.0)
Monocytes Relative: 8 %
Neutro Abs: 15.2 10*3/uL — ABNORMAL HIGH (ref 1.7–7.7)
Neutrophils Relative %: 83 %
Platelets: 218 10*3/uL (ref 150–400)
RBC: 5.06 MIL/uL (ref 4.22–5.81)
RDW: 14 % (ref 11.5–15.5)
WBC: 18.2 10*3/uL — ABNORMAL HIGH (ref 4.0–10.5)
nRBC: 0 % (ref 0.0–0.2)

## 2021-02-01 LAB — MAGNESIUM: Magnesium: 1.9 mg/dL (ref 1.7–2.4)

## 2021-02-01 MED ORDER — LACTATED RINGERS IV BOLUS
1000.0000 mL | Freq: Once | INTRAVENOUS | Status: AC
  Administered 2021-02-01: 1000 mL via INTRAVENOUS

## 2021-02-01 MED ORDER — CHLORHEXIDINE GLUCONATE CLOTH 2 % EX PADS
6.0000 | MEDICATED_PAD | Freq: Every day | CUTANEOUS | Status: DC
  Administered 2021-02-01 – 2021-02-05 (×5): 6 via TOPICAL

## 2021-02-01 MED ORDER — LORAZEPAM 2 MG/ML IJ SOLN
1.0000 mg | INTRAMUSCULAR | Status: DC | PRN
  Administered 2021-02-03: 1 mg via INTRAVENOUS
  Filled 2021-02-01: qty 1

## 2021-02-01 NOTE — Progress Notes (Signed)
patient BP is 87/59 PR 69,on Tele SR, has a scheduled metropolol 2.5mg  IVP @ 6 am and has LR running at 75. pt denies any symptoms.MD made aware. Hold Metropolol per Dr. Bryn Gulling.

## 2021-02-01 NOTE — Progress Notes (Signed)
1 Day Post-Op  Subjective: Patient has minimal incisional pain.  Is tolerating ice chips fine.  Objective: Vital signs in last 24 hours: Temp:  [97.6 F (36.4 C)-98.3 F (36.8 C)] 97.6 F (36.4 C) (03/26 0531) Pulse Rate:  [77-101] 92 (03/26 0531) Resp:  [14-27] 18 (03/26 0035) BP: (82-107)/(49-74) 87/59 (03/26 0531) SpO2:  [92 %-100 %] 95 % (03/26 0531) Weight:  [105.2 kg] 105.2 kg (03/25 1258) Last BM Date: 01/31/21  Intake/Output from previous day: 03/25 0701 - 03/26 0700 In: 3900 [I.V.:3700; IV Piggyback:200] Out: 2400 [Urine:450; Emesis/NG output:250; Stool:1200; Blood:250] Intake/Output this shift: Total I/O In: 300 [Other:300] Out: 950 [Emesis/NG output:500; Stool:450]  General appearance: alert, cooperative and no distress Resp: clear to auscultation bilaterally Cardio: regular rate and rhythm, S1, S2 normal, no murmur, click, rub or gallop GI: Soft, bowel sounds present.  Colostomy patent with beefy red mucosa.  Incision healing well.  Lab Results:  Recent Labs    01/31/21 0841 02/01/21 0549  WBC 17.5* 18.2*  HGB 17.1* 14.9  HCT 51.0 44.3  PLT 297 218   BMET Recent Labs    01/31/21 0841 02/01/21 0549  NA 131* 133*  K 4.1 4.6  CL 97* 104  CO2 22 18*  GLUCOSE 145* 112*  BUN 15 21  CREATININE 1.11 1.07  CALCIUM 8.4* 7.8*   PT/INR No results for input(s): LABPROT, INR in the last 72 hours.  Studies/Results: CT Abdomen Pelvis W Contrast  Result Date: 01/31/2021 CLINICAL DATA:  Worsening abdominal pain, increased distension, history of chronic sigmoid stenosis related to recurrent bouts of diverticulitis. EXAM: CT ABDOMEN AND PELVIS WITH CONTRAST TECHNIQUE: Multidetector CT imaging of the abdomen and pelvis was performed using the standard protocol following bolus administration of intravenous contrast. CONTRAST:  12mL OMNIPAQUE IOHEXOL 300 MG/ML  SOLN COMPARISON:  01/24/2021 FINDINGS: Lower chest: Similar minor lingula scarring otherwise clear lung  bases. Normal heart size. No pericardial or pleural effusion. Hepatobiliary: No focal hepatic abnormality. There are a few scattered punctate calcified granulomata noted. No biliary dilatation. Patent hepatic and portal veins. Gallbladder unremarkable. Common bile duct nondilated. Pancreas: Unremarkable. No pancreatic ductal dilatation or surrounding inflammatory changes. Spleen: Normal in size without focal abnormality. Adrenals/Urinary Tract: Normal adrenal glands. Bilateral renal cysts again noted. No renal obstruction or hydronephrosis. No hydroureter or ureteral calculus. Urinary bladder under distended. Stomach/Bowel: Stomach and duodenum are collapsed. However, there is progressive distension throughout the small bowel and marked distension of the colon with air-fluid levels. This extends to the pelvis where the distal sigmoid colon has a similar thick-walled narrowed segment which is compatible with a bowel stenosis but difficult to exclude underlying transmural constricting lesion. Appearance compatible with worsening distal colonic obstruction. Scattered colonic diverticula noted. No definite acute inflammatory process or diverticulitis. No evidence of perforation, free air, significant free fluid, fluid collection, abscess or ascites. Vascular/Lymphatic: Aortoiliac atherosclerosis without occlusive process, aneurysm or acute vascular finding. Mesenteric and renal vasculature appear to remain patent. No veno-occlusive disease. No bulky adenopathy. Reproductive: Prostate gland is mildly enlarged. Other: Similar small fat containing inguinal hernias. Small fat containing umbilical hernia. No large hernia appreciated. Musculoskeletal: Degenerative changes of the spine and lower lumbar facet arthropathy. No acute osseous finding. Similar chronic L4 on L5 grade 1 anterolisthesis. IMPRESSION: Worsening distal colonic obstruction pattern with increased fluid distension of the small bowel and the colon with  scattered air-fluid levels extending to a similar area of lower sigmoid luminal narrowing/wall thickening which would be compatible with a chronic bowel  stenosis but difficult to exclude underlying transmural constricting lesion. No evidence of perforation or abscess. No area of acute diverticulitis appreciated. Aortic Atherosclerosis (ICD10-I70.0). Electronically Signed   By: Jerilynn Mages.  Shick M.D.   On: 01/31/2021 11:07    Anti-infectives: Anti-infectives (From admission, onward)   Start     Dose/Rate Route Frequency Ordered Stop   01/31/21 1200  metroNIDAZOLE (FLAGYL) IVPB 500 mg       "And" Linked Group Details   500 mg 100 mL/hr over 60 Minutes Intravenous On call to O.R. 01/31/21 1145 01/31/21 1319   01/31/21 1200  ciprofloxacin (CIPRO) IVPB 400 mg       "And" Linked Group Details   400 mg 200 mL/hr over 60 Minutes Intravenous On call to O.R. 01/31/21 1145 01/31/21 1408      Assessment/Plan: s/p Procedure(s): COLOSTOMY PARTIAL COLECTOMY Impression: Overall stable on postoperative day 1.  Patient does have mild hypotension and this will be addressed with fluid boluses.  His metoprolol has been held.  His NG tube was displaced, thus it was removed.  Will advance to full liquid diet.  Will monitor leukocytosis.  LOS: 1 day    Aviva Signs 02/01/2021

## 2021-02-02 LAB — CBC
HCT: 42.2 % (ref 39.0–52.0)
Hemoglobin: 13.8 g/dL (ref 13.0–17.0)
MCH: 29.3 pg (ref 26.0–34.0)
MCHC: 32.7 g/dL (ref 30.0–36.0)
MCV: 89.6 fL (ref 80.0–100.0)
Platelets: 184 10*3/uL (ref 150–400)
RBC: 4.71 MIL/uL (ref 4.22–5.81)
RDW: 13.9 % (ref 11.5–15.5)
WBC: 14 10*3/uL — ABNORMAL HIGH (ref 4.0–10.5)
nRBC: 0 % (ref 0.0–0.2)

## 2021-02-02 LAB — GLUCOSE, CAPILLARY
Glucose-Capillary: 111 mg/dL — ABNORMAL HIGH (ref 70–99)
Glucose-Capillary: 114 mg/dL — ABNORMAL HIGH (ref 70–99)
Glucose-Capillary: 115 mg/dL — ABNORMAL HIGH (ref 70–99)
Glucose-Capillary: 119 mg/dL — ABNORMAL HIGH (ref 70–99)
Glucose-Capillary: 128 mg/dL — ABNORMAL HIGH (ref 70–99)
Glucose-Capillary: 139 mg/dL — ABNORMAL HIGH (ref 70–99)

## 2021-02-02 LAB — PHOSPHORUS: Phosphorus: 2.8 mg/dL (ref 2.5–4.6)

## 2021-02-02 LAB — BASIC METABOLIC PANEL
Anion gap: 9 (ref 5–15)
BUN: 12 mg/dL (ref 8–23)
CO2: 25 mmol/L (ref 22–32)
Calcium: 8 mg/dL — ABNORMAL LOW (ref 8.9–10.3)
Chloride: 96 mmol/L — ABNORMAL LOW (ref 98–111)
Creatinine, Ser: 0.98 mg/dL (ref 0.61–1.24)
GFR, Estimated: >60 mL/min (ref >60)
Glucose, Bld: 124 mg/dL — ABNORMAL HIGH (ref 70–99)
Potassium: 4.6 mmol/L (ref 3.5–5.1)
Sodium: 130 mmol/L — ABNORMAL LOW (ref 135–145)

## 2021-02-02 LAB — MAGNESIUM: Magnesium: 1.8 mg/dL (ref 1.7–2.4)

## 2021-02-02 MED ORDER — SODIUM CHLORIDE 0.9 % IV SOLN: INTRAVENOUS | Status: DC

## 2021-02-02 NOTE — Progress Notes (Signed)
2 Days Post-Op  Subjective: Minimal incisional pain.  Tolerating full liquid diet.  Sitting up in chair.  Objective: Vital signs in last 24 hours: Temp:  [97.8 F (36.6 C)-98.9 F (37.2 C)] 97.8 F (36.6 C) (03/27 0530) Pulse Rate:  [89-99] 99 (03/27 0530) Resp:  [16-18] 18 (03/26 1739) BP: (101-120)/(63-76) 120/72 (03/27 0530) SpO2:  [92 %-96 %] 95 % (03/27 0530) Last BM Date: 02/02/21  Intake/Output from previous day: 03/26 0701 - 03/27 0700 In: 4702.1 [P.O.:960; I.V.:2077.9; IV Piggyback:1004.3] Out: 1250 [Urine:200; Emesis/NG output:500; Stool:550] Intake/Output this shift: Total I/O In: 680 [P.O.:680] Out: 500 [Urine:500]  General appearance: alert, cooperative and no distress Resp: clear to auscultation bilaterally Cardio: regular rate and rhythm, S1, S2 normal, no murmur, click, rub or gallop GI: Soft, colostomy patent and full of stool.  Incision healing well.  Lab Results:  Recent Labs    02/01/21 0549 02/02/21 0618  WBC 18.2* 14.0*  HGB 14.9 13.8  HCT 44.3 42.2  PLT 218 184   BMET Recent Labs    02/01/21 0549 02/02/21 0618  NA 133* 130*  K 4.6 4.6  CL 104 96*  CO2 18* 25  GLUCOSE 112* 124*  BUN 21 12  CREATININE 1.07 0.98  CALCIUM 7.8* 8.0*   PT/INR No results for input(s): LABPROT, INR in the last 72 hours.  Studies/Results: No results found.  Anti-infectives: Anti-infectives (From admission, onward)   Start     Dose/Rate Route Frequency Ordered Stop   01/31/21 1200  metroNIDAZOLE (FLAGYL) IVPB 500 mg       "And" Linked Group Details   500 mg 100 mL/hr over 60 Minutes Intravenous On call to O.R. 01/31/21 1145 01/31/21 1319   01/31/21 1200  ciprofloxacin (CIPRO) IVPB 400 mg       "And" Linked Group Details   400 mg 200 mL/hr over 60 Minutes Intravenous On call to O.R. 01/31/21 1145 01/31/21 1408      Assessment/Plan: s/p Procedure(s): COLOSTOMY PARTIAL COLECTOMY Impression: Stable on postoperative day 2.  Leukocytosis resolving.   Ostomy patent. Plan: Patient would like to stay on full liquid diet for today.  Anticipate enterostomal therapy education tomorrow.  Final pathology pending.  LOS: 2 days    Aviva Signs 02/02/2021

## 2021-02-02 NOTE — Consult Note (Signed)
[facility] Nurse ostomy consult note Stoma type/location: LLQ end colostomy with mild irritant contact dermatitis. Stomal assessment/size: Oval, lumen in center, slightly raised, 90% red, 10% nonviable tissue Peristomal assessment: intact, mild erythema (irritant contact dermatitis) in the immediate peristomal area due to leakage  ICD-10 CM Codes for Irritant Dermatitis L24B3 - Related to fecal or urinary stoma or fistula  Treatment options for stomal/peristomal skin: resize and refit ostomy pouching system. Consider skin barrier ring if not resolved with next pouch change. Output: Light brown liquid effluent Ostomy pouching: 2pc. 2 and 3/4 inch pouching system.  Will consider use of skin barrier ring with next pouch change and provide for [facility]  Education provided:  Explained role of ostomy nurse and creation of stoma  Explained stoma characteristics (budded, shape (oval), flush, color, texture, care) Demonstrated pouch change (cutting new skin barrier, measuring stoma, cleaning peristomal skin and stoma) Education on emptying when 1/3 to 1/2 full and how to empty Demonstrated use of wick to clean spout  Discussed bathing (that he may shower with pouch on or off), gas  Answered patient questions.    Enrolled patient in [place] program: Yes, today. Requested 4 pouches (clear with gas filter), 4 skin barriers, 4 skin barrier rings, powder.  7 pouches, 6 skin barrier rings, 7 skin barriers at bedside for discharge.  Patient would benefit from the support, assessment and continuing education provided by a [facility]  If you agree, please Order/Arrange with Care Management.  [facility] nursing team will follow, and will remain available to this patient, the nursing and medical teams.  Thanks, Maudie Flakes, MSN, RN, [facility], Arther Abbott  Pager# [phone]

## 2021-02-03 ENCOUNTER — Encounter (HOSPITAL_COMMUNITY): Admission: RE | Admit: 2021-02-03 | Payer: No Typology Code available for payment source | Source: Ambulatory Visit

## 2021-02-03 ENCOUNTER — Encounter (HOSPITAL_COMMUNITY): Payer: Self-pay | Admitting: General Surgery

## 2021-02-03 ENCOUNTER — Other Ambulatory Visit (HOSPITAL_COMMUNITY): Payer: No Typology Code available for payment source

## 2021-02-03 LAB — GLUCOSE, CAPILLARY
Glucose-Capillary: 130 mg/dL — ABNORMAL HIGH (ref 70–99)
Glucose-Capillary: 95 mg/dL (ref 70–99)
Glucose-Capillary: 142 mg/dL — ABNORMAL HIGH (ref 70–99)
Glucose-Capillary: 118 mg/dL — ABNORMAL HIGH (ref 70–99)
Glucose-Capillary: 116 mg/dL — ABNORMAL HIGH (ref 70–99)

## 2021-02-03 LAB — URINALYSIS, ROUTINE W REFLEX MICROSCOPIC
Bacteria, UA: NONE SEEN
Bilirubin Urine: NEGATIVE
Glucose, UA: NEGATIVE mg/dL
Ketones, ur: 5 mg/dL — AB
Leukocytes,Ua: NEGATIVE
Nitrite: NEGATIVE
Protein, ur: NEGATIVE mg/dL
Specific Gravity, Urine: 1.009 (ref 1.005–1.030)
pH: 5 (ref 5.0–8.0)

## 2021-02-03 LAB — CBC
HCT: 40.8 % (ref 39.0–52.0)
Hemoglobin: 13.4 g/dL (ref 13.0–17.0)
MCH: 29.1 pg (ref 26.0–34.0)
MCHC: 32.8 g/dL (ref 30.0–36.0)
MCV: 88.7 fL (ref 80.0–100.0)
Platelets: 193 10*3/uL (ref 150–400)
RBC: 4.6 MIL/uL (ref 4.22–5.81)
RDW: 13.7 % (ref 11.5–15.5)
WBC: 12.3 10*3/uL — ABNORMAL HIGH (ref 4.0–10.5)
nRBC: 0 % (ref 0.0–0.2)

## 2021-02-03 LAB — BASIC METABOLIC PANEL
Anion gap: 11 (ref 5–15)
BUN: 10 mg/dL (ref 8–23)
CO2: 24 mmol/L (ref 22–32)
Calcium: 8.1 mg/dL — ABNORMAL LOW (ref 8.9–10.3)
Chloride: 96 mmol/L — ABNORMAL LOW (ref 98–111)
Creatinine, Ser: 0.81 mg/dL (ref 0.61–1.24)
GFR, Estimated: >60 mL/min (ref >60)
Glucose, Bld: 103 mg/dL — ABNORMAL HIGH (ref 70–99)
Potassium: 4.2 mmol/L (ref 3.5–5.1)
Sodium: 131 mmol/L — ABNORMAL LOW (ref 135–145)

## 2021-02-03 MED ORDER — OXYCODONE HCL 5 MG PO TABS
5.0000 mg | ORAL_TABLET | ORAL | Status: DC | PRN
  Administered 2021-02-04: 10 mg via ORAL
  Filled 2021-02-03: qty 2

## 2021-02-03 MED ORDER — HYDROMORPHONE HCL 1 MG/ML IJ SOLN: 1.0000 mg | INTRAMUSCULAR | Status: DC | PRN

## 2021-02-03 MED ORDER — ALPRAZOLAM 1 MG PO TABS
1.0000 mg | ORAL_TABLET | Freq: Three times a day (TID) | ORAL | Status: DC | PRN
  Administered 2021-02-03 – 2021-02-05 (×3): 1 mg via ORAL
  Filled 2021-02-03 (×3): qty 1

## 2021-02-03 MED ORDER — LISINOPRIL 10 MG PO TABS: 10.0000 mg | ORAL_TABLET | Freq: Every day | ORAL | Status: DC

## 2021-02-03 MED ORDER — METOPROLOL TARTRATE 25 MG PO TABS
25.0000 mg | ORAL_TABLET | Freq: Two times a day (BID) | ORAL | Status: DC
  Administered 2021-02-03 – 2021-02-05 (×5): 25 mg via ORAL
  Filled 2021-02-03 (×5): qty 1

## 2021-02-03 NOTE — TOC Initial Note (Signed)
Transition of Care [facility]) - Initial/Assessment Note    Patient Details  Name: Francisco Allen MRN: [id number] Date of Birth: 01-06-1961  Transition of Care [facility]) CM/SW Contact:    Boneta Lucks, RN Phone Number: 02/03/2021, 1:41 PM  Clinical Narrative:     Patient admitted with stenosis colon,   Surgery repair ending in Colostomy. TOC consulted for Home Health RN.  TOC at the bedside, patient is doing well, is agreeable to [facility].  TOC called companies no on has accepted at present and patient could have $20 - $130 copay per visit.  Patient will have his wife call insurance to see what copay will be. TOC will continue to look for [facility].             Expected Discharge Plan: [place] Barriers to Discharge: Continued Medical Work up   Patient Goals and CMS Choice Patient states their goals for this hospitalization and ongoing recovery are:: to go home. CMS Medicare.gov Compare Post Acute Care list provided to:: Patient Choice offered to / list presented to : Patient  Expected Discharge Plan and Services Expected Discharge Plan: [place]     Prior Living Arrangements/Services   Lives with:: Spouse          Need for Family Participation in Patient Care: Yes (Comment) Care giver support system in place?: Yes (comment)   Criminal Activity/Legal Involvement Pertinent to Current Situation/Hospitalization: No - Comment as needed  Activities of Daily Living Home Assistive Devices/Equipment: None ADL Screening (condition at time of admission) Patient's cognitive ability adequate to safely complete daily activities?: Yes Is the patient deaf or have difficulty hearing?: No Does the patient have difficulty seeing, even when wearing glasses/contacts?: No Does the patient have difficulty concentrating, remembering, or making decisions?: No Patient able to express need for assistance with ADLs?: Yes Does the patient have difficulty dressing or bathing?:  No Independently performs ADLs?: Yes (appropriate for developmental age) Does the patient have difficulty walking or climbing stairs?: No Weakness of Legs: None Weakness of Arms/Hands: None  Permission Sought/Granted   Emotional Assessment     Affect (typically observed): Accepting Orientation: : Oriented to Self,Oriented to Place,Oriented to  Time,Oriented to Situation Alcohol / Substance Use: Not Applicable Psych Involvement: No (comment)  Admission diagnosis:  Colonic obstruction ([place]) [K56.609] Stenosis colon ([place]) [K56.699] Patient Active Problem List   Diagnosis Date Noted  . Stenosis colon ([place]) 01/28/2021  . Hip pain, right 02/28/2020  . HTN (hypertension) 09/23/2017  . Tobacco use 09/23/2017  . Hematochezia 04/22/2017  . Controlled type 2 diabetes mellitus with microalbuminuria ([place]) 02/09/2017  . Diverticulitis of colon 10/21/2015  . Morbid obesity ([place]) 05/14/2015  . Migraines 04/09/2014  . GAD (generalized anxiety disorder) 04/04/2013  . Hyperlipidemia 04/04/2013   PCP:  Dettinger, Fransisca Kaufmann, MD Pharmacy:   [place], [place] [address] Phone: [phone] Fax: [phone]   Readmission Risk Interventions Readmission Risk Prevention Plan 02/03/2021  Medication Screening Complete  Transportation Screening Complete  Some recent data might be hidden

## 2021-02-03 NOTE — Progress Notes (Signed)
[facility] Progress Note  3 Days Post-Op  Subjective: Anxious this Am but doing well otherwise. Had some burning with urination and UA is negative. Likely from foley removal from weekend. He is up in the chair and has been ambulating. Ostomy RN worked with him yesterday.   Objective: Vital signs in last 24 hours: Temp:  [97.6 F (36.4 C)-98.1 F (36.7 C)] 98.1 F (36.7 C) (03/28 0347) Pulse Rate:  [78-91] 83 (03/28 1141) Resp:  [17-18] 18 (03/28 0347) BP: (105-128)/(69-83) 128/83 (03/28 1141) SpO2:  [90 %-94 %] 90 % (03/28 0347) Last BM Date: 02/03/21  Intake/Output from previous day: 03/27 0701 - 03/28 0700 In: 2189.5 [P.O.:2000; I.V.:189.5] Out: 3500 [Urine:3500] Intake/Output this shift: Total I/O In: 360 [P.O.:360] Out: 200 [Urine:100; Stool:100]  General appearance: alert, cooperative and no distress Resp: normal work of breathing GI: soft, nondistended, appropriately tender, midline c/d/i with staples, ostomy pink with edges that are sloughing, stool and gas in bag  Lab Results:  Recent Labs    02/02/21 0618 02/03/21 0507  WBC 14.0* 12.3*  HGB 13.8 13.4  HCT 42.2 40.8  PLT 184 193   BMET Recent Labs    02/02/21 0618 02/03/21 0507  NA 130* 131*  K 4.6 4.2  CL 96* 96*  CO2 25 24  GLUCOSE 124* 103*  BUN 12 10  CREATININE 0.98 0.81  CALCIUM 8.0* 8.1*    Anti-infectives: Anti-infectives (From admission, onward)   Start     Dose/Rate Route Frequency Ordered Stop   01/31/21 1200  metroNIDAZOLE (FLAGYL) IVPB 500 mg       "And" Linked Group Details   500 mg 100 mL/hr over 60 Minutes Intravenous On call to O.R. 01/31/21 1145 01/31/21 1319   01/31/21 1200  ciprofloxacin (CIPRO) IVPB 400 mg       "And" Linked Group Details   400 mg 200 mL/hr over 60 Minutes Intravenous On call to O.R. 01/31/21 1145 01/31/21 1408     Results for IREOLUWA, GRANT (MRN [id number]) as of 02/03/2021 12:37  Ref. Range 02/03/2021 10:27  Appearance Latest Ref  Range: CLEAR  CLEAR  Bilirubin Urine Latest Ref Range: NEGATIVE  NEGATIVE  Color, Urine Latest Ref Range: YELLOW  YELLOW  Glucose, UA Latest Ref Range: NEGATIVE mg/dL NEGATIVE  Hgb urine dipstick Latest Ref Range: NEGATIVE  SMALL (A)  Ketones, ur Latest Ref Range: NEGATIVE mg/dL 5 (A)  Leukocytes,Ua Latest Ref Range: NEGATIVE  NEGATIVE  Nitrite Latest Ref Range: NEGATIVE  NEGATIVE  pH Latest Ref Range: 5.0 - 8.0  5.0  Protein Latest Ref Range: NEGATIVE mg/dL NEGATIVE  Specific Gravity, Urine Latest Ref Range: 1.005 - 1.030  1.009  Bacteria, UA Latest Ref Range: NONE SEEN  NONE SEEN  Mucus Unknown PRESENT  RBC / HPF Latest Ref Range: 0 - 5 RBC/hpf 0-5  Squamous Epithelial / LPF Latest Ref Range: 0 - 5  0-5  WBC, UA Latest Ref Range: 0 - 5 WBC/hpf 0-5   Assessment/Plan: Mr. Terhune is a 60 yo with large bowel obstruction from sigmoid stenosis s/p end colostomy and colectomy. Doing fair. Having pain control but has not been on orals yet. Remains anxious.  PRN roxicodone added for pain  IS, OOB HD ok, changed to home metoprolol dose Heart Healthy Diet UOP adequate, Dc IVF, Hyponatremia like from hypervolemia Expect urine complaints to resolve with time SCDs, heparin sq Ostomy RN working with him [place] ordered Likely dc tomorrow    LOS: 3 days  [name] 02/03/2021

## 2021-02-04 ENCOUNTER — Encounter (HOSPITAL_COMMUNITY): Payer: Self-pay | Admitting: General Surgery

## 2021-02-04 ENCOUNTER — Ambulatory Visit: Payer: No Typology Code available for payment source | Admitting: Nurse Practitioner

## 2021-02-04 LAB — BPAM RBC
Blood Product Expiration Date: 202204142359
Blood Product Expiration Date: 202204142359
Unit Type and Rh: 6200
Unit Type and Rh: 6200

## 2021-02-04 LAB — GLUCOSE, CAPILLARY
Glucose-Capillary: 108 mg/dL — ABNORMAL HIGH (ref 70–99)
Glucose-Capillary: 112 mg/dL — ABNORMAL HIGH (ref 70–99)
Glucose-Capillary: 115 mg/dL — ABNORMAL HIGH (ref 70–99)
Glucose-Capillary: 124 mg/dL — ABNORMAL HIGH (ref 70–99)
Glucose-Capillary: 149 mg/dL — ABNORMAL HIGH (ref 70–99)
Glucose-Capillary: 152 mg/dL — ABNORMAL HIGH (ref 70–99)

## 2021-02-04 LAB — TYPE AND SCREEN
ABO/RH(D): A POS
Antibody Screen: POSITIVE
Unit division: 0
Unit division: 0

## 2021-02-04 LAB — CBC WITH DIFFERENTIAL/PLATELET
Abs Immature Granulocytes: 0.43 10*3/uL — ABNORMAL HIGH (ref 0.00–0.07)
Basophils Absolute: 0.1 10*3/uL (ref 0.0–0.1)
Basophils Relative: 1 %
Eosinophils Absolute: 0.1 10*3/uL (ref 0.0–0.5)
Eosinophils Relative: 1 %
HCT: 44.9 % (ref 39.0–52.0)
Hemoglobin: 14.8 g/dL (ref 13.0–17.0)
Immature Granulocytes: 3 %
Lymphocytes Relative: 8 %
Lymphs Abs: 1.4 10*3/uL (ref 0.7–4.0)
MCH: 28.7 pg (ref 26.0–34.0)
MCHC: 33 g/dL (ref 30.0–36.0)
MCV: 87.2 fL (ref 80.0–100.0)
Monocytes Absolute: 0.9 10*3/uL (ref 0.1–1.0)
Monocytes Relative: 5 %
Neutro Abs: 13.7 10*3/uL — ABNORMAL HIGH (ref 1.7–7.7)
Neutrophils Relative %: 82 %
Platelets: 246 10*3/uL (ref 150–400)
RBC: 5.15 MIL/uL (ref 4.22–5.81)
RDW: 13.3 % (ref 11.5–15.5)
WBC: 16.6 10*3/uL — ABNORMAL HIGH (ref 4.0–10.5)
nRBC: 0 % (ref 0.0–0.2)

## 2021-02-04 MED ORDER — ONDANSETRON 4 MG PO TBDP: 4.0000 mg | ORAL_TABLET | Freq: Four times a day (QID) | ORAL | 0 refills | Status: DC | PRN

## 2021-02-04 MED ORDER — OXYCODONE HCL 5 MG PO TABS: 5.0000 mg | ORAL_TABLET | ORAL | 0 refills | Status: DC | PRN

## 2021-02-04 NOTE — TOC Progression Note (Signed)
Transition of Care [facility]) - Progression Note    Patient Details  Name: RUSELL MENEELY MRN: [id number] Date of Birth: [birth year]  Transition [facility] Care [facility]) CM/SW Contact  Salome Arnt, [place] Phone Number: 02/04/2021, 12:40 PM  Clinical Narrative:  LCSW contacted [facility], [facility], [facility], [facility], and [facility] about home health RN. All agencies declined, except [facility] who is continuing to verify benefits. LCSW called [facility] and left voicemail requesting call to determine if pt is eligible for any RN services. TOC will follow up this afternoon.      Expected Discharge Plan: [place] Barriers to Discharge: Continued Medical Work up  Expected Discharge Plan and Services Expected Discharge Plan: [place]                                               Social Determinants of Health (SDOH) Interventions    Readmission Risk Interventions Readmission Risk Prevention Plan 02/03/2021  Medication Screening Complete  Transportation Screening Complete  Some recent data might be hidden

## 2021-02-04 NOTE — TOC Progression Note (Signed)
Transition of Care [facility]) - Progression Note    Patient Details  Name: Francisco Allen MRN: [id number] Date of Birth: Sep 07, 1961  Transition of Care [facility]) CM/SW Contact  Ihor Gully, LCSW Phone Number: 02/04/2021, 4:58 PM  Clinical Narrative:    Insurance company has reached out to both [facility] and [place] as they contract with them. They said they will return contact with once they have an answer. Public relations account executive number is [phone].      Expected Discharge Plan: [place] Barriers to Discharge: Continued Medical Work up  Expected Discharge Plan and Services Expected Discharge Plan: [place]                                               Social Determinants of Health (SDOH) Interventions    Readmission Risk Interventions Readmission Risk Prevention Plan 02/03/2021  Medication Screening Complete  Transportation Screening Complete  Some recent data might be hidden

## 2021-02-04 NOTE — Discharge Summary (Addendum)
Physician Discharge Summary  Patient ID: Francisco Allen MRN: [id number] DOB/AGE: 1960-12-12 61 y.o.  Admit date: 01/31/2021 Discharge date: 02/05/2021  Admission Diagnoses: Large bowel Obstruction, Stenosis Colon  Discharge Diagnoses:  Principal Problem:   Stenosis colon [facility])   Discharged Condition: good  Hospital Course: [name] is a 60 yo who had a sigmoid stricture on colonoscopy in 2018. He started to have abdominal pain, bloating, and nausea and issues with constipation/ diarrhea in the weeks leading up to seeing me. He had a CT scan that demonstrated a part of the sigmoid colon with stricture and progressive large bowel obstruction with distended colon with stool. We had attempted to bowel prep him and do an elective surgery with resection and anastomosis but this was not possible given that he became more obstructed and required hospitalization and urgent colostomy placement.    On 01/31/2021 he was admitted through the ED and underwent an urgent partial colectomy and end colostomy.  He did well post operatively and had return of bowel function and functioning ostomy. He had adequate pain control with oral meds and was ambulating.  He was instructed on how to change his ostomy and was set up with [facility] for his DC.   The day prior to his discharge his midline wound was looking slightly red on the edges, and I opened up a few staples and old hematoma was evacuated.  This was packed with saline dampened gauze and covered with an ABD.    We attempted to arrange [facility] for the patient for RN for ostomy care and wound care, but he had a high copay and he opted to forgo this care at home.    Due to inability to get [facility] we opted to get the Ostomy RN to come and see him one more time before discharge. This allowed him to demonstrate his skills to them and ensure he had no questions.   Consults: Wound RN/ Ostomy   Significant Diagnostic Studies: None   Treatments: IV hydration and  3/25/20222 Open partial colectomy and end colostomy   Discharge Exam: Blood pressure 131/87, pulse 71, temperature 97.8 F (36.6 C), resp. rate 18, height 5\' 9"  (1.753 m), weight 105.2 kg, SpO2 96 %. General appearance: alert, cooperative and no distress Resp: normal work of breathing GI: soft, nondistended, ostomy bag in place and with stool in bag, wound with staples superiorly and inferiorly packing changed, skin with no erythema Extremities: extremities normal, atraumatic, no cyanosis or edema  Disposition: Discharge disposition: 01-Home or [place] with care from niece that is an Therapist, sports and friend CNA per patient's report as he did not want to pay the copay for [facility] RN.   Discharge Instructions    Call MD for:  difficulty breathing, headache or visual disturbances   Complete by: As directed    Call MD for:  extreme fatigue   Complete by: As directed    Call MD for:  hives   Complete by: As directed    Call MD for:  persistant dizziness or light-headedness   Complete by: As directed    Call MD for:  persistant nausea and vomiting   Complete by: As directed    Call MD for:  redness, tenderness, or signs of infection (pain, swelling, redness, odor or green/yellow discharge around incision site)   Complete by: As directed    Call MD for:  severe uncontrolled pain   Complete by: As directed  Call MD for:  temperature >100.4   Complete by: As directed    Increase activity slowly   Complete by: As directed      Allergies as of 02/05/2021      Reactions   Bee Venom Anaphylaxis, Swelling, Other (See Comments)   Anaphylactic shock   Penicillins Anaphylaxis, Swelling   Airway closes up Has patient had a PCN reaction causing immediate rash, facial/tongue/throat swelling, SOB or lightheadedness with hypotension: Yes Has patient had a PCN reaction causing severe rash involving mucus membranes or skin necrosis: Yes Has patient had a PCN reaction that required  hospitalization: Francisco Allen Has patient had a PCN reaction occurring within the last 10 years: No If all of the above answers are "NO", then may proceed with Cephalosporin use.      Medication List    TAKE these medications   ALPRAZolam 1 MG tablet Commonly known as: XANAX Take 1 tablet (1 mg total) by mouth 3 (three) times daily as needed for anxiety.   cholecalciferol 25 MCG (1000 UNIT) tablet Commonly known as: VITAMIN D3 Take 1,000 Units by mouth daily.   clobetasol 0.05 % external solution Commonly known as: TEMOVATE Apply 1 application topically 2 (two) times daily.   Fish Oil 1000 MG Caps Take 1,000 mg by mouth daily.   ibuprofen 800 MG tablet Commonly known as: ADVIL Take 1 tablet (800 mg total) by mouth every 8 (eight) hours as needed.   lisinopril 10 MG tablet Commonly known as: ZESTRIL TAKE 1 TABLET DAILY   MENS 50+ MULTI VITAMIN/MIN PO Take 1 tablet by mouth daily.   metFORMIN 500 MG tablet Commonly known as: GLUCOPHAGE TAKE  (1)  TABLET TWICE A DAY WITH MEALS (BREAKFAST AND SUPPER) What changed: See the new instructions.   metoprolol tartrate 25 MG tablet Commonly known as: LOPRESSOR TAKE(1)  TABLET TWICE A DAY. What changed: See the new instructions.   ondansetron 4 MG disintegrating tablet Commonly known as: ZOFRAN-ODT Take 1 tablet (4 mg total) by mouth every 6 (six) hours as needed for nausea.   oxyCODONE 5 MG immediate release tablet Commonly known as: Oxy IR/ROXICODONE Take 1 tablet (5 mg total) by mouth every 4 (four) hours as needed for severe pain or breakthrough pain.   rosuvastatin 40 MG tablet Commonly known as: CRESTOR TAKE 1 TABLET DAILY       Follow-up Information    [name], MD Follow up on 02/11/2021.   Specialty: General Surgery Why: wound check and ostomy check  Contact information: [address] [facility] [address] [phone]               Signed: [name] 02/05/2021, 11:27 AM

## 2021-02-04 NOTE — Consult Note (Signed)
[place] Nurse ostomy follow up Spoke with bedside RN regarding patient ostomy teaching after no answer in room.  Patient agrees to stay for one additional teaching session since [facility] not available to him. I will see him first thing in the morning.  RN and surgery team aware.   Will follow.  Domenic Moras MSN, RN, FNP-BC CWON Wound, Ostomy, Continence Nurse Pager [phone]

## 2021-02-04 NOTE — Progress Notes (Signed)
[facility]  Seen this am. Pain controlled and knows wound care after staples removed for minor redness and hematoma evacuated. Ostomy RN will see tomorrow since [facility] is not going to be possible to ensure he has all his questions answered and issues worked out to prevent readmission etc.  Will discharge tomorrow. Patient and RN in agreement.  Curlene Labrum, MD

## 2021-02-04 NOTE — Progress Notes (Addendum)
Pt was given demonstration of changing ostomy bag and packing midline incision. Pt was then told to give step by step instructions of how to do so. Pt understands completely and has no further questions at this time. Supplies given to pt to change ostomy bag and to pack midline incision.   Pt has decided to stay another day to see ostomy RN.

## 2021-02-04 NOTE — TOC Progression Note (Signed)
Transition of Care [facility]) - Progression Note    Patient Details  Name: Francisco Allen MRN: [id number] Date of Birth: 1961-05-11  Transition of Care [facility]) CM/SW Contact  Ihor Gully, LCSW Phone Number: 02/04/2021, 5:04 PM  Clinical Narrative:    Mechele Claude with patient's insurance company returned contact advising that [place] and [facility] have no staff available to accept patient. Mechele Claude left a message for HCP [facility] [address], to identify if they could service patient.    Expected Discharge Plan: [place] Barriers to Discharge: Continued Medical Work up  Expected Discharge Plan and Services Expected Discharge Plan: [place]                                               Social Determinants of Health (SDOH) Interventions    Readmission Risk Interventions Readmission Risk Prevention Plan 02/03/2021  Medication Screening Complete  Transportation Screening Complete  Some recent data might be hidden

## 2021-02-05 ENCOUNTER — Inpatient Hospital Stay (HOSPITAL_COMMUNITY)
Admission: RE | Admit: 2021-02-05 | Payer: No Typology Code available for payment source | Source: Home / Self Care | Admitting: General Surgery

## 2021-02-05 ENCOUNTER — Telehealth (INDEPENDENT_AMBULATORY_CARE_PROVIDER_SITE_OTHER): Payer: No Typology Code available for payment source | Admitting: General Surgery

## 2021-02-05 ENCOUNTER — Encounter (HOSPITAL_COMMUNITY): Admission: RE | Payer: Self-pay | Source: Home / Self Care

## 2021-02-05 DIAGNOSIS — K5732 Diverticulitis of large intestine without perforation or abscess without bleeding: Secondary | ICD-10-CM

## 2021-02-05 DIAGNOSIS — K56699 Other intestinal obstruction unspecified as to partial versus complete obstruction: Secondary | ICD-10-CM

## 2021-02-05 LAB — CBC WITH DIFFERENTIAL/PLATELET
Abs Immature Granulocytes: 0.25 10*3/uL — ABNORMAL HIGH (ref 0.00–0.07)
Basophils Absolute: 0.1 10*3/uL (ref 0.0–0.1)
Basophils Relative: 1 %
Eosinophils Absolute: 0.1 10*3/uL (ref 0.0–0.5)
Eosinophils Relative: 1 %
HCT: 40.8 % (ref 39.0–52.0)
Hemoglobin: 13.8 g/dL (ref 13.0–17.0)
Immature Granulocytes: 2 %
Lymphocytes Relative: 9 %
Lymphs Abs: 1.2 10*3/uL (ref 0.7–4.0)
MCH: 29 pg (ref 26.0–34.0)
MCHC: 33.8 g/dL (ref 30.0–36.0)
MCV: 85.7 fL (ref 80.0–100.0)
Monocytes Absolute: 0.8 10*3/uL (ref 0.1–1.0)
Monocytes Relative: 6 %
Neutro Abs: 11.5 10*3/uL — ABNORMAL HIGH (ref 1.7–7.7)
Neutrophils Relative %: 81 %
Platelets: 217 10*3/uL (ref 150–400)
RBC: 4.76 MIL/uL (ref 4.22–5.81)
RDW: 13.6 % (ref 11.5–15.5)
WBC: 13.9 10*3/uL — ABNORMAL HIGH (ref 4.0–10.5)
nRBC: 0 % (ref 0.0–0.2)

## 2021-02-05 LAB — GLUCOSE, CAPILLARY
Glucose-Capillary: 111 mg/dL — ABNORMAL HIGH (ref 70–99)
Glucose-Capillary: 112 mg/dL — ABNORMAL HIGH (ref 70–99)
Glucose-Capillary: 131 mg/dL — ABNORMAL HIGH (ref 70–99)
Glucose-Capillary: 147 mg/dL — ABNORMAL HIGH (ref 70–99)

## 2021-02-05 LAB — SURGICAL PATHOLOGY

## 2021-02-05 SURGERY — COLECTOMY, PARTIAL
Anesthesia: General

## 2021-02-05 NOTE — Consult Note (Signed)
[place] Nurse ostomy follow up Stoma type/location: LLQ colostomy, essentially flush Stomal assessment/size: 1 1/2" today, not oval at this assessment, less budded. Stoma is 80% pink and moist and 20% Sloughing mucosa.  Stoma is patent and producing soft brown stool.  Peristomal assessment: Circumferential erythema from contact with effluent. Medical adhesive related skin injury at outer most border from 4 to 6 0'clock.  Pink blister and linear partial thickness tissue loss. I trim the barrier to avoid pouching in this area and protect with no sting skin prep.  Treatment options for stomal/peristomal skin: Implement barrier ring today. COntinue 2 piece pouch due to round abdomen.  Output soft brown stool.  Ostomy pouching: 2pc. 2 3/4" pouch with barrier ring.  Education provided: Informed that addition of barrier ring should prolong wear time and keep stool diverted into pouch.  Explained flush stoma and sloughing mucosa.  We discuss twice weekly pouch changes, showering and emptying when 1/3 full.  Patient removes pouch and we discuss (MARSI) and why we will avoid pouching this area for now.  He cleanses stoma, applied barrier ring and cut barrier to fit. Assembled pouch and barrier and applied to skin.  I point out the contact dermatitis present to peristomal skin and explain that if pouch leaks, it must be changed to avoid skin irritation and contact with stool. He rolls pouch closed.  He feels comfortable with going home.  He is enrolled in secure start.  Discharging today with 4 pouch sets and barrier rings.   * I explain that he can be seen in [facility] outpatient ostomy clinic, inside [facility], if pouching or ostomy care issues arrive.   Referrals can be made through his surgeon office.  [phone] (Clinic office) Enrolled patient in [facility] Start Discharge program: Yes Will not follow at this time.  Please re-consult if needed.  Domenic Moras MSN, RN, FNP-BC CWON Wound, Ostomy,  Continence Nurse Pager [phone]

## 2021-02-05 NOTE — Discharge Instructions (Signed)
Discharge Open Abdominal Surgery Instructions:  Common Complaints: Pain at the incision site is common. This will improve with time. Take your pain medications as described below. Some nausea is common and poor appetite. The main goal is to stay hydrated the first few days after surgery.   Diet/ Activity: Diet as tolerated. You have started and tolerated a diet in the hospital, and should continue to increase what you are able to eat.   You may not have a large appetite, but it is important to stay hydrated. Drink 64 ounces of water a day. Your appetite will return with time.    Wound Care: Ostomy wafer/ bag change as needed as instructed by the RNs. Empty the bag regularly to keep it from leaking. Change your midline (incision) dressing twice daily with saline dampened gauze (squeeze excess saline out) and cover with pad and paper tape.  You may shower but keep your colostomy and the incision covered with a piece of plastic (trash bag and tape around the edges works) versus doing a bird bath type of showering.   Rest and listen to your body, but do not remain in bed all day.  Walk everyday for at least 15-20 minutes. Deep cough and move around every 1-2 hours in the first few days after surgery.  Do not lift > 10 lbs, perform excessive bending, pushing, pulling, squatting for 6-8 weeks after surgery.  The activity restrictions are to prevent hernia formation at your incision while you are healing.  Do not place lotions or balms on your incision.  Pain Expectations and Narcotics: -After surgery you will have pain associated with your incisions and this is normal. The pain is muscular and nerve pain, and will get better with time. -You are encouraged and expected to take non narcotic medications like tylenol and ibuprofen (when able) to treat pain as multiple modalities can aid with pain treatment. -Narcotics are only used when pain is severe or there is breakthrough pain. -You are not  expected to have a pain score of 0 after surgery, as we cannot prevent pain. A pain score of 3-4 that allows you to be functional, move, walk, and tolerate some activity is the goal. The pain will continue to improve over the days after surgery and is dependent on your surgery. -Due to [place] law, we are only able to give a certain amount of pain medication to treat post operative pain, and we only give additional narcotics on a patient by patient basis.  -For most laparoscopic surgery, studies have shown that the majority of patients only need 10-15 narcotic pills, and for open surgeries most patients only need 15-20.   -Having appropriate expectations of pain and knowledge of pain management with non narcotics is important as we do not want anyone to become addicted to narcotic pain medication.  -Using ice packs in the first 48 hours and heating pads after 48 hours, wearing an abdominal binder (when recommended), and using over the counter medications are all ways to help with pain management.   -Simple acts like meditation and mindfulness practices after surgery can also help with pain control and research has proven the benefit of these practices.  Medication: Take tylenol and ibuprofen as needed for pain control, alternating every 4-6 hours.  Example:  Tylenol 1000mg  @ 6am, 12noon, 6pm, 96midnight (Do not exceed 4000mg  of tylenol a day). Ibuprofen 800mg  @ 9am, 3pm, 9pm, 3am (Do not exceed 3600mg  of ibuprofen a day).  Take Roxicodone for breakthrough pain  every 4 hours.  Take Colace for constipation related to narcotic pain medication. If you do not have a bowel movement in 2 days, take Miralax over the counter.  Drink plenty of water to also prevent constipation.   Contact Information: If you have questions or concerns, please call our office, [phone], Monday- Thursday 8AM-5PM and Friday 8AM-12Noon.  If it is after hours or on the weekend, please call [facility] Main Number, [phone],  [phone], and ask to speak to the surgeon on call for Dr. Constance Haw at [facility].    Colostomy Surgery, Adult, Care After  This sheet gives you information about how to care for yourself after your procedure. Your health care provider may also give you more specific instructions. If you have problems or questions, contact your health care provider. What can I expect after the procedure? After the procedure, it is common to have:  Swelling at the opening that was created during the procedure (stoma).  Slight bleeding around the stoma.  Redness around the stoma. Follow these instructions at home: Activity  Rest as needed while the stoma area heals.  Return to your normal activities as told by your health care provider. Ask your health care provider what activities are safe for you.  Avoid strenuous activity and abdominal exercises for 3 weeks or for as long as told by your health care provider.  Do not lift anything that is heavier than 10 lb (4.5 kg), or the limit that you are told, until your health care provider says that it is safe. Incision care  Follow instructions from your health care provider about how to take care of your incision. Make sure you: ? Wash your hands with soap and water before you change your bandage (dressing). If soap and water are not available, use hand sanitizer. ? Change your dressing as told by your health care provider. ? Leave stitches (sutures), skin glue, or adhesive strips in place. These skin closures may need to stay in place for 2 weeks or longer. If adhesive strip edges start to loosen and curl up, you may trim the loose edges. Do not remove adhesive strips completely unless your health care provider tells you to do that. Stoma care  Keep the stoma area clean.  Clean and dry the skin around the stoma each time you change the colostomy bag. To clean the stoma area: ? Use warm water and only use cleansers that are recommended by your health  care provider. ? Rinse the stoma area with plain water. ? Dry the area well.  Use stoma powder or skin barrier film on your skin only as told by your health care provider. Do not use any other powders, gels, wipes, or creams on the skin around the stoma.  Check the stoma area every day for signs of infection. Check for: ? More redness, swelling, or pain. ? More fluid or blood. ? Pus or warmth.  Measure the stoma opening regularly and record the size. Watch for changes. Share this information with your health care provider. Bathing  Do not take baths, swim, or use a hot tub until your health care provider approves. Ask your health care provider if you may take showers. You may be able to shower with or without the colostomy bag in place. If you bathe with the bag on, dry the bag afterward.  Avoid using harsh or oily soaps when you bathe. Colostomy bag care  Follow instructions from your health care provider about how to empty or  change the colostomy bag.  Keep colostomy supplies with you at all times.  Store all supplies in a cool, dry place.  Empty the colostomy bag: ? Whenever it is one-third to one-half full. ? At bedtime.  Replace the bag every 3-4 days for the first 6 weeks, then every 4-7 days. Driving  Follow driving restrictions as told by your health care provider.  Do not drive or use heavy machinery while taking prescription pain medicine. General instructions  Follow instructions from your health care provider about eating or drinking restrictions.  Take over-the-counter and prescription medicines only as told by your health care provider.  Avoid wearing clothes that are tight directly over your stoma area.  Do not use any products that contain nicotine or tobacco, such as cigarettes and e-cigarettes. If you need help quitting, ask your health care provider.  If you are a woman, ask your health care provider about becoming pregnant and about using birth  control. Medicines may not be absorbed normally after the procedure.  Keep all follow-up visits as told by your health care provider. This is important. Contact a health care provider if you have:  Trouble caring for your stoma or changing the colostomy bag.  Nausea or vomiting.  A fever.  More redness, swelling, or pain at the site of your stoma or around your anus.  More fluid or blood coming from your stoma or your anus.  Warmth around your stoma area.  Pus coming from your stoma.  A change in the size or appearance of the stoma.  Abdominal pain, bloating, pressure, or cramping.  Stool more often or less often than your health care provider tells you to expect.  Very little urine production. This may be a sign of dehydration. Get help right away if you have:  Abdominal pain that does not go away or becomes severe.  Frequent vomiting.  No stool draining through the stoma.  Chest pain or an irregular heartbeat. Summary  Follow instructions from your health care provider about how to take care of your incision and stoma.  Contact a health care provider if you have trouble caring for your stoma or changing the colostomy bag.  Get help right away if you have abdominal pain that does not go away or becomes severe or if you have no stool draining through the stoma.  Keep all follow-up visits as told by your health care provider. This is important. This information is not intended to replace advice given to you by your health care provider. Make sure you discuss any questions you have with your health care provider. Document Revised: 02/22/2018 Document Reviewed: 02/22/2018 Elsevier Patient Education  2021 [place], Adult  Colostomy surgery is done to create an opening in the front of the abdomen for stool (feces) to leave the body through an ostomy (stoma). Part of the large intestine is attached to the stoma. A bag, also called a pouch, is  fitted over the stoma. Stool and gas will collect in the bag. After surgery, you will need to empty and change your colostomy bag as needed. You will also need to care for your stoma. How to care for the stoma Your stoma should look pink, red, and moist, like the inside of your cheek. Soon after surgery, the stoma may be swollen, but this swelling will go away within 6 weeks. To care for the stoma:  Keep the skin around the stoma clean and dry.  Use a  clean, soft washcloth to gently wash the stoma and the skin around it. Clean using a circular motion, and wipe away from the stoma opening, not toward it. ? Use warm water and only use cleansers recommended by your health care provider. ? Rinse the stoma area with plain water. ? Dry the area around the stoma well.  Use stoma powder or ointment on your skin only as told by your health care provider. Do not use any other powders, gels, wipes, or creams on the skin around the stoma.  Check the stoma area every day for signs of infection. Check for: ? New or worsening redness, swelling, or pain. ? New or increased fluid or blood. ? Pus or warmth.  Measure the stoma opening regularly and record the size. Watch for changes. (It is normal for the stoma to get smaller as swelling goes away.) Share this information with your health care provider. How to empty the colostomy bag Empty your bag at bedtime and whenever it is one-third to one-half full. Do not let the bag get more than half-full with stool or gas. The bag could leak if it gets too full. Some colostomy bags have a built-in gas release valve that releases gas often throughout the day. Follow these basic steps: 1. Wash your hands with soap and water. 2. Sit far back on the toilet seat. 3. Put several pieces of toilet paper into the toilet water. This will prevent splashing as you empty stool into the toilet. 4. Remove the clip or the hook-and-loop fastener from the tail end of the  bag. 5. Unroll the tail, then empty the stool into the toilet. 6. Clean the tail with toilet paper or a moist towelette. 7. Reroll the tail, and close it with the clip or the hook-and-loop fastener. 8. Wash your hands again.   How to change the colostomy bag Change your bag every 3-4 days or as often as told by your health care provider. Also change the bag if it is leaking or separating from the skin, or if your skin around the stoma looks or feels irritated. Irritated skin may be a sign that the bag is leaking. Always have colostomy supplies with you, and follow these basic steps: 1. Wash your hands with soap and water. Have paper towels or tissues nearby to clean any discharge. 2. Remove the old bag and skin barrier. Use your fingers or a warm cloth to gently push the skin away from the barrier. 3. Clean the stoma area with water or with mild soap and water, as directed. Use water to rinse away any soap. 4. Dry the skin. You may use the cool setting on a hair dryer to do this. 5. Use a tracing pattern (template) to cut the skin barrier to the size needed. 6. If you are using a two-piece bag, attach the bag and the skin barrier to each other. Add the barrier ring, if you use one. 7. If directed, apply stoma powder or skin barrier gel to the skin. 8. Warm the skin barrier with your hands, or blow with a hair dryer for 5-10 seconds. 9. Remove the paper from the adhesive strip of the skin barrier. 10. Press the adhesive strip onto the skin around the stoma. 11. Gently rub the skin barrier onto the skin. This creates heat that helps the barrier to stick. 12. Apply stoma tape to the edges of the skin barrier, if desired. 60. Wash your hands again. General recommendations  Avoid wearing tight  clothes or having anything press directly on your stoma or bag. Change your clothing whenever it is soiled or damp.  You may shower or bathe with the bag on or off. Do not use harsh or oily soaps or  lotions. Dry the skin and bag after bathing.  Store all supplies in a cool, dry place. Do not leave supplies in extreme heat because some parts can melt or not stick as well.  Whenever you leave home, take extra clothing and an extra skin barrier and bag with you.  If your bag gets wet, you can dry it with a hair dryer on the cool setting.  To prevent odor, you may put drops of ostomy deodorizer in the bag.  If recommended by your health care provider, put ostomy lubricant inside the bag. This helps stool to slide out of the bag more easily and completely. Contact a health care provider if:  You have new or worsening redness, swelling, or pain around your stoma.  You have new or increased fluid or blood coming from your stoma.  Your stoma feels warm to the touch.  You have pus coming from your stoma.  Your stoma extends in or out farther than normal.  You need to change your bag every day.  You have a fever. Get help right away if:  Your stool is bloody.  You have nausea or you vomit.  You have trouble breathing. Summary  Measure your stoma opening regularly and record the size. Watch for changes.  Empty your bag at bedtime and whenever it is one-third to one-half full. Do not let the bag get more than half-full with stool or gas.  Change your bag every 3-4 days or as often as told by your health care provider.  Whenever you leave home, take extra clothing and an extra skin barrier and bag with you. This information is not intended to replace advice given to you by your health care provider. Make sure you discuss any questions you have with your health care provider. Document Revised: 06/27/2020 Document Reviewed: 06/27/2020 Elsevier Patient Education  2021 [employer].

## 2021-02-05 NOTE — Telephone Encounter (Signed)
[facility]  Pathology benign. Notified patient.  FINAL MICROSCOPIC DIAGNOSIS:   A. COLON, SIGMOID, RESECTION:  - Diverticulosis with acute diverticulitis. Serosal adhesions. Colonic stenosis grossly.  - Three morphologically benign lymph nodes.   Will see 4/5 in the office.  Curlene Labrum, MD [facility] [address] [place],  [address] [phone] (office)

## 2021-02-06 ENCOUNTER — Ambulatory Visit: Payer: No Typology Code available for payment source | Admitting: General Surgery

## 2021-02-07 ENCOUNTER — Other Ambulatory Visit: Payer: Self-pay | Admitting: *Deleted

## 2021-02-07 NOTE — Patient Outreach (Addendum)
[facility]) Care Management  02/07/2021  Francisco Allen 07-Feb-1961 [id number]   Transition of care telephone call  Referral received:02/07/21 Initial outreach:02/07/21 Insurance: [facility]   Initial unsuccessful telephone call to patient's home number and mobile  number  in order to complete transition of care assessment; no answer, left HIPAA compliant voicemail message requesting return call.    Objective: .Per electronic medical  record [name]  was hospitalized at [facility] 3/25-3/30/22 for Large Bowel obstruction,  stenosis of Colon , Partial Colectomy, end  Colostomy  Comorbidities include: Diverticulitis of colon, Diabetes type 2, A1c 6.6 on 01/31/21,  Hyperlipidemia,  He was discharged to home on 02/05/21  without the home health services or DME.    Plan: This RNCM will route unsuccessful outreach letter with [place] Management pamphlet and 24 hour Nurse Advice Line Magnet to [place] Management clinical pool to be mailed to patient's home address. This RNCM will attempt another outreach within 4 business days.   Joylene Draft, RN, BSN  [place] Management Coordinator  [phone]- Mobile [phone]- Toll Free Main Office

## 2021-02-10 ENCOUNTER — Other Ambulatory Visit: Payer: Self-pay | Admitting: *Deleted

## 2021-02-10 NOTE — Patient Outreach (Signed)
[facility]) Care Management  02/10/2021  Francisco Allen 1961-05-15 [id number]   Transition of care call Referral received: 02/07/21 Initial outreach attempt:02/07/21 Insurance: [place]    2nd unsuccessful telephone call to patient's preferred contact number in order to complete post hospital discharge transition of care assessment , no answer no voice mail pick up. Placed call to patient mobile number no answer able to leave a HIPAA compliant voice mail message for return call.   Placed call to patient DPR, Francisco Allen, wife, no answer mailbox full unable to leave a message.    Objective: Per electronic medical  record [name] was hospitalized [facility] 3/25-3/30/22 for Large Bowel obstruction,  stenosis of Colon , Partial Colectomy, end  Colostomy  Comorbidities include: Diverticulitis of colon, Diabetes type 2, A1c 6.6 on 01/31/21,  Hyperlipidemia,  He was discharged to home on 02/05/21  without the home health servicesor DME.    Plan If no return call from patient will attempt 3rd outreach in the next 4 business days.    Joylene Draft, RN, BSN  [place] Management Coordinator  [phone]- Mobile [phone]- Toll Free Main Office

## 2021-02-11 ENCOUNTER — Other Ambulatory Visit: Payer: Self-pay | Admitting: Family Medicine

## 2021-02-11 ENCOUNTER — Encounter: Payer: Self-pay | Admitting: General Surgery

## 2021-02-11 ENCOUNTER — Ambulatory Visit (INDEPENDENT_AMBULATORY_CARE_PROVIDER_SITE_OTHER): Payer: No Typology Code available for payment source | Admitting: General Surgery

## 2021-02-11 ENCOUNTER — Other Ambulatory Visit: Payer: Self-pay

## 2021-02-11 VITALS — BP 127/84 | HR 92 | Temp 97.2°F | Resp 20 | Ht 69.0 in | Wt 224.0 lb

## 2021-02-11 DIAGNOSIS — K56699 Other intestinal obstruction unspecified as to partial versus complete obstruction: Secondary | ICD-10-CM

## 2021-02-11 DIAGNOSIS — K5732 Diverticulitis of large intestine without perforation or abscess without bleeding: Secondary | ICD-10-CM

## 2021-02-11 MED ORDER — OXYCODONE HCL 5 MG PO TABS: 5.0000 mg | ORAL_TABLET | ORAL | 0 refills | Status: DC | PRN

## 2021-02-11 NOTE — Patient Instructions (Signed)
Pack wound twice daily with saline dampened gauze cover with pad and paper tape.  Ostomy care. Keep stools soft.  Will see back next week to check on wound. Tylenol and ibuprofen for pain and Roxicodone for breakthrough pain.   No heavy lifting > 10 lbs, excessive bending, pushing, pulling, or squatting for 6-8 weeks after surgery.  Will hold on any squatting and bending for therapy until at least 4 weeks.

## 2021-02-11 NOTE — Progress Notes (Signed)
[facility]   HPI:  60 y.o. Male presents to clinic for post-op follow-up evaluation of his end colostomy. He is having to pack his midline wound and has some tenderness in this area and drainage. He has run out of pain medication. The ostomy bag is going ok and he is having output.  Review of Systems:  No fever or chills Ostomy output  All other review of systems: otherwise negative   Vital Signs:  BP 127/84   Pulse 92   Temp (!) 97.2 F (36.2 C) (Other (Comment))   Resp 20   Ht 5\' 9"  (1.753 m)   Wt 224 lb (101.6 kg)   SpO2 96%   BMI 33.08 kg/m    Physical Exam:  Physical Exam Vitals reviewed.  HENT:     Head: Normocephalic.     Nose: Nose normal.  Cardiovascular:     Rate and Rhythm: Normal rate.  Pulmonary:     Effort: Pulmonary effort is normal.  Abdominal:     General: There is no distension.     Palpations: Abdomen is soft.     Tenderness: There is abdominal tenderness.     Comments: Ostomy pink and bag in place with stool, midline inferior wound open with drainage, staples removed in remaining wound and skin opened, packing placed to all of wound, fascia intact  Neurological:     Mental Status: He is alert.      Assessment:  60 y.o. yo Male with midline wound dehiscence after colostomy after hematoma.  Plan:  Pack wound twice daily with saline dampened gauze cover with pad and paper tape.  Ostomy care. Keep stools soft.  Will see back next week to check on wound. Tylenol and ibuprofen for pain and Roxicodone for breakthrough pain.   No heavy lifting > 10 lbs, excessive bending, pushing, pulling, or squatting for 6-8 weeks after surgery.  Will hold on any squatting and bending for therapy until at least 4 weeks. So hold on PT for knee for now.   Roxicodone refilled.   Future Appointments  Date Time Provider [place]  02/18/2021 10:15 AM [name], MD [facility] None    All of the above recommendations were  discussed with the patient and patient's family, and all of patient's and family's questions were answered to their expressed satisfaction.  Curlene Labrum, MD [facility] [address] [place], [place] [address] [phone] (office)

## 2021-02-14 ENCOUNTER — Other Ambulatory Visit: Payer: Self-pay | Admitting: *Deleted

## 2021-02-14 NOTE — Patient Outreach (Signed)
[facility]) Care Management  02/14/2021  RENAUD CELLI 1961/01/02 [id number]  Covering Landis Martins, RN  Transition of care telephone call  Outreach #3: 02/14/2021 Insurance: [place]    Unsuccessful telephone call to patient's preferred number in order to complete transition of care assessment; no answer, left HIPAA compliant voicemail message requesting return call on the cell contact.  Plan: RNCM will attempt another outreach within 4 business days.  Raina Mina, RN Care Management Coordinator [place] Office [phone]

## 2021-02-18 ENCOUNTER — Other Ambulatory Visit: Payer: Self-pay

## 2021-02-18 ENCOUNTER — Encounter: Payer: Self-pay | Admitting: General Surgery

## 2021-02-18 ENCOUNTER — Ambulatory Visit (INDEPENDENT_AMBULATORY_CARE_PROVIDER_SITE_OTHER): Payer: No Typology Code available for payment source | Admitting: General Surgery

## 2021-02-18 VITALS — BP 133/83 | HR 88 | Temp 97.9°F | Resp 14 | Ht 69.0 in | Wt 217.0 lb

## 2021-02-18 DIAGNOSIS — K56699 Other intestinal obstruction unspecified as to partial versus complete obstruction: Secondary | ICD-10-CM

## 2021-02-18 DIAGNOSIS — T8130XA Disruption of wound, unspecified, initial encounter: Secondary | ICD-10-CM

## 2021-02-18 DIAGNOSIS — K5732 Diverticulitis of large intestine without perforation or abscess without bleeding: Secondary | ICD-10-CM

## 2021-02-18 MED ORDER — OXYCODONE HCL 5 MG PO TABS: 5.0000 mg | ORAL_TABLET | ORAL | 0 refills | Status: DC | PRN

## 2021-02-19 ENCOUNTER — Other Ambulatory Visit: Payer: Self-pay | Admitting: Family Medicine

## 2021-02-19 DIAGNOSIS — I25118 Atherosclerotic heart disease of native coronary artery with other forms of angina pectoris: Secondary | ICD-10-CM

## 2021-02-19 DIAGNOSIS — R809 Proteinuria, unspecified: Secondary | ICD-10-CM

## 2021-02-19 DIAGNOSIS — E785 Hyperlipidemia, unspecified: Secondary | ICD-10-CM

## 2021-02-19 DIAGNOSIS — E1129 Type 2 diabetes mellitus with other diabetic kidney complication: Secondary | ICD-10-CM

## 2021-02-19 NOTE — Progress Notes (Signed)
[facility] Note   HPI:  60 y.o. Male presents to clinic for post op follow up. Ostomy is working and going well. Packing wound dehiscence daily. Some drainage. Improving pain.   Review of Systems:  Drainage from wound No redness Improving pain All other review of systems: otherwise negative   Vital Signs:  BP 133/83   Pulse 88   Temp 97.9 F (36.6 C) (Other (Comment))   Resp 14   Ht 5\' 9"  (1.753 m)   Wt 217 lb (98.4 kg)   SpO2 96%   BMI 32.05 kg/m    Physical Exam:  Physical Exam Vitals reviewed.  Constitutional:      Appearance: Normal appearance.  Cardiovascular:     Rate and Rhythm: Normal rate.  Pulmonary:     Effort: Pulmonary effort is normal.  Abdominal:     General: There is no distension.     Palpations: Abdomen is soft.     Tenderness: There is abdominal tenderness.     Comments: Midline wound with drainage, fascia intact but tunneling deep, ostomy pink with stool in bag  Neurological:     Mental Status: He is alert.      Assessment:  60 y.o. yo Male with ostomy for sigmoid stricture and large bowel obstruction. Wound dehiscence being packed. Doing well overall but still feeling weak.  Plan:  - Pack wound BID or more for drainage - Ostomy care - Refill Roxicodone 5mg  q 4 PRN # 30 (will see in 2 weeks)  - Try Boost/ Ensure for energy - Continue to increase activity   Future Appointments  Date Time Provider [place]  03/04/2021  9:15 AM [name], MD [facility] None  03/06/2021  9:30 AM Alfonzo Feller, RN [facility] None     Curlene Labrum, MD [facility] [address] [place], [place] [address] [phone] (office)

## 2021-03-04 ENCOUNTER — Encounter: Payer: Self-pay | Admitting: General Surgery

## 2021-03-04 ENCOUNTER — Other Ambulatory Visit: Payer: Self-pay

## 2021-03-04 ENCOUNTER — Ambulatory Visit (INDEPENDENT_AMBULATORY_CARE_PROVIDER_SITE_OTHER): Payer: No Typology Code available for payment source | Admitting: General Surgery

## 2021-03-04 VITALS — BP 115/77 | HR 99 | Temp 98.7°F | Resp 16 | Ht 69.0 in | Wt 210.0 lb

## 2021-03-04 DIAGNOSIS — K56699 Other intestinal obstruction unspecified as to partial versus complete obstruction: Secondary | ICD-10-CM

## 2021-03-04 DIAGNOSIS — T8130XA Disruption of wound, unspecified, initial encounter: Secondary | ICD-10-CM

## 2021-03-04 MED ORDER — OXYCODONE HCL 5 MG PO TABS: 5.0000 mg | ORAL_TABLET | ORAL | 0 refills | Status: DC | PRN

## 2021-03-04 NOTE — Progress Notes (Signed)
[facility]   HPI:  60 y.o. Male presents to clinic for follow-up evaluation of his ostomy and midline wound. He is packing the wound twice a day. He is having issues with leakage from his bag due to sneezing right now.   Review of Systems:  No fever or chills Leaking from bag All other review of systems: otherwise negative   Vital Signs:  BP 115/77   Pulse 99   Temp 98.7 F (37.1 C) (Other (Comment))   Resp 16   Ht 5\' 9"  (1.753 m)   Wt 210 lb (95.3 kg)   SpO2 97%   BMI 31.01 kg/m    Physical Exam:  Physical Exam Vitals reviewed.  Cardiovascular:     Rate and Rhythm: Normal rate.  Pulmonary:     Effort: Pulmonary effort is normal.  Abdominal:     General: There is no distension.     Palpations: Abdomen is soft.     Tenderness: There is abdominal tenderness.     Comments: Ostomy pink with some irritated /macerated skin inferior, wound with granulation, some fibrinous drainage, two areas deep that need packing        Assessment:  60 y.o. yo Male s/p ostomy for stricture of the sigmoid colon. Doing well overall but needs to pack the midline a little deeper. We went over this. Will get referred to GI for colonoscopy, he wants to transfer to someone in [place].  Plan:  Pack wound deep in those holes we looked at. Continue ostomy care.  Talk to Dr. Berenice Primas. You can do some therapy but cannot lift over 10 lbs or do any excessive bending over or squatting for another 4 weeks. Referral to GI for colonoscopy   Future Appointments  Date Time Provider [place]  03/06/2021  9:30 AM Alfonzo Feller, RN [facility] None  03/18/2021  9:15 AM [name], MD [facility] None      Curlene Labrum, MD [facility] [address] [place], [place] [address] [phone] (office)

## 2021-03-04 NOTE — Patient Instructions (Addendum)
Pack wound deep in those holes we looked at. Continue ostomy care.  Talk to Dr. Berenice Primas. You can do some therapy but cannot lift over 10 lbs or do any excessive bending over or squatting for another 4 weeks.  Will get you referred for colonoscopy with Dr. Laural Golden or Dr. Jenetta Downer.

## 2021-03-06 ENCOUNTER — Other Ambulatory Visit: Payer: Self-pay | Admitting: *Deleted

## 2021-03-06 ENCOUNTER — Encounter: Payer: Self-pay | Admitting: *Deleted

## 2021-03-06 NOTE — Patient Outreach (Addendum)
[facility]) Care Management  03/06/2021  ABRAHIM SARGENT 03/13/1961 [id number]   Transition of care /Case Closure Unsuccessful outreach    Referral received:02/07/21 Initial outreach:02/07/21 Insurance: [place]   #4 Call Attempt, Unsuccessful outreach call to patient home number no answer, able to leave a HIPAA compliant voicemail message on mobile number for return call.  Unable to complete post hospital discharge transition of care assessment. No return call form patient after 3 call attempts and no response to request to contact RN Care Coordinator in unsuccessful outreach letter mailed to home on 02/07/21.  Objective: Per electronicmedicalrecord Mr. Espn Zeman hospitalized [facility] 3/25-3/30/22 for Large Bowel obstruction, stenosis of Colon , Partial Colectomy, end Colostomy Comorbidities include: Diverticulitis of colon, Diabetes type 2, A1c 6.6 on 01/31/21, Hyperlipidemia,  [place] discharged to home on 3/30/22without the home health servicesor DME.  Plan Case closed to [employer] , unsuccessful call attempt x 4.   Addendum Incoming return call from patient  4th outreach call   Subjective: 4th attempt successful telephone call to patient's preferred number in order to complete transition of care assessment; 2 HIPAA identifiers verified. Explained purpose of call and completed transition of care assessment.  States that he is doing okay this is a one of my better days. He discussed recent visit with surgeon and got a pretty good report , states surgical wound is healing slow. Patient reports that he is able to manage changing dressing as recommended.  denies post-operative problems. He  states surgical pain well managed with prescribed medications, tolerating diet, denies bowel or bladder problems. He reports managing pretty good with  ostomy care discussed contacting surgeon office if  concerns regarding ostomy care .   Spouse is  assisting with his recovery.   Reviewed accessing the following [place] Benefits : He discussed his history and family  of Diabetes that is well controlled with oral medication, he is agreeable to Active Health Management referral   [place] chronic disease management programs. Patient declined education or resource education on smoking cessation .  He does not use  a [facility] outpatient pharmacy.       Assessment:  Patient voices good understanding of all discharge instructions.  See transition of care flowsheet for assessment details.   Plan:  Reviewed hospital discharge diagnosis of Partial colectomy, colostomy    and discharge treatment plan using hospital discharge instructions, assessing medication adherence, reviewing problems requiring provider notification, and discussing the importance of follow up with surgeon, primary care provider and/or specialists as directed. Reinforced signs symptoms of infection to notify surgeon of sooner.   Reviewed [place] healthy lifestyle program information to receive discounted premium for  2023   Step 1: Get  your annual physical  Step 2: Complete your health assessment  Step 3:Identify your current health status and complete the corresponding action step between November 09, 2020 and July 10, 2021.    Patient discussed being interested in enrolling  in [place] management condition management program for Diabetes, with Active health management enrolled patient to participate.  No ongoing care management needs identified so will close case to [facility] services and route successful outreach letter with [place] Management pamphlet and 24 Hour Nurse Line Magnet to [place] [facility] clinical pool to be mailed to patient's home address.    Joylene Draft, RN, BSN  [place] Management Coordinator   [phone]-  Mobile [phone]- [place]

## 2021-03-10 ENCOUNTER — Ambulatory Visit (INDEPENDENT_AMBULATORY_CARE_PROVIDER_SITE_OTHER): Payer: No Typology Code available for payment source | Admitting: Gastroenterology

## 2021-03-10 ENCOUNTER — Other Ambulatory Visit (INDEPENDENT_AMBULATORY_CARE_PROVIDER_SITE_OTHER): Payer: Self-pay

## 2021-03-10 ENCOUNTER — Other Ambulatory Visit: Payer: Self-pay

## 2021-03-10 ENCOUNTER — Encounter (INDEPENDENT_AMBULATORY_CARE_PROVIDER_SITE_OTHER): Payer: Self-pay

## 2021-03-10 ENCOUNTER — Telehealth (INDEPENDENT_AMBULATORY_CARE_PROVIDER_SITE_OTHER): Payer: Self-pay

## 2021-03-10 ENCOUNTER — Encounter (INDEPENDENT_AMBULATORY_CARE_PROVIDER_SITE_OTHER): Payer: Self-pay | Admitting: Gastroenterology

## 2021-03-10 VITALS — BP 125/84 | HR 99 | Temp 99.2°F | Ht 69.0 in | Wt 208.0 lb

## 2021-03-10 DIAGNOSIS — K56699 Other intestinal obstruction unspecified as to partial versus complete obstruction: Secondary | ICD-10-CM

## 2021-03-10 DIAGNOSIS — K5732 Diverticulitis of large intestine without perforation or abscess without bleeding: Secondary | ICD-10-CM

## 2021-03-10 MED ORDER — PEG 3350-KCL-NA BICARB-NACL 420 G PO SOLR: 4000.0000 mL | ORAL | 0 refills | Status: DC

## 2021-03-10 NOTE — Telephone Encounter (Signed)
LeighAnn Vir Whetstine, CMA  

## 2021-03-10 NOTE — Patient Instructions (Signed)
Schedule colonoscopy

## 2021-03-10 NOTE — Progress Notes (Signed)
Francisco Allen, M.D. Gastroenterology & Hepatology [facility] [address] [place], [place] [address] Primary Care Physician: Dettinger, Fransisca Kaufmann, MD [place] [address]  Referring MD: Curlene Labrum, MD  Chief Complaint: Complicated diverticulitis, sigmoid stricture requiring partial colectomy  History of Present Illness: Francisco Allen is a 60 y.o. male with past medical history of GERD, hyperlipidemia and recent complicated admission to the hospital due to colonic obstruction due to complicated diverticulitis and sigmoid stricture, who presents for evaluation of complicated diverticulitis and prior stricture.  Patient was admitted to the hospital on 01/31/2021 after presenting an episode of although large bowel obstruction.  There is obstruction was secondary to a stricture in the sigmoid colon, for which she has no undergo an urgent partial colectomy and end colostomy on 01/31/2021 by Dr. Blake Divine.  The patient tolerated surgery adequately, but postop progress was complicated by surgical hematoma that required removing staples and evacuation with packing.  Surgical specimen showed presence of diverticulosis with acute diverticulitis and serosal adhesions with colonic stenosis throughout the sigmoid colon.  There were 3 lymph nodes that were removed which were benign.    The patient has presented progressive improvement in his postsurgical recovery as his abdominal wound has been healing through time.  Based on discussion with Dr. Constance Haw, he may undergo potential reversal of colostomy on July 2022 but patient will require repeat colonoscopy before reversing it.  He was referred to gastroenterology for this.   Patient denies any complaints at the moment.  He has been taking intermittently oxycodone for episodes of abdominal pain but he tries to avoid taking it as much as possible.  Patient reports that after his  surgery, he has done relatively well. He has been seeing general surgery for follow up (with Dr. Constance Haw). He cleans his ostomy bag 3-4 times, has brown stool all the time - no melean or hematochezia. He has tried high fiber food. He has lost 56 lb since his surgery, which he believes most of it happened after his surgery as he wa snot having too much appetite. He reports more recently he has been feeling more appetite, which has made him feel better.   The patient denies having any nausea, vomiting, fever, chills, hematochezia, melena, hematemesis, abdominal distention, diarrhea, jaundice, pruritus or weight loss.  Last Colonoscopy: 03/03/2017, scope was advanced to the cecum, a 5 mm polyp was resected from the sigmoid colon (pathology consistent with hyperplastic polyp), he also had severe sigmoid stenosis and presence of multiple diverticulosis in the left colon, internal hemorrhoids were found.  FHx: neg for any gastrointestinal/liver disease, brother colon cancer at age 67, brother liver cancer Social: smokes 1 pack a day, used to drink alcohol but quit 15 years ago, no illicit drug use Surgical: partial colectomy with ostomy creation  Past Medical History: Past Medical History:  Diagnosis Date  . Arthritis    "all over my body I think" (09/23/2017)  . Colon polyp   . Diverticulitis   . GERD (gastroesophageal reflux disease)    hx  . Hyperlipidemia   . Large bowel obstruction ([place]) 01/31/2021  . Pre-diabetes     Past Surgical History: Past Surgical History:  Procedure Laterality Date  . ARTHROSCOPY KNEE W/ DRILLING Left 10/25/2020  . COLONOSCOPY  X ~ 2   "brother passed away w/colon cancer; I've probably had 4 colonoscopies done since he passed" (09/23/2017)  . COLONOSCOPY W/ BIOPSIES AND POLYPECTOMY  X 2  .  COLOSTOMY N/A 01/31/2021   Procedure: COLOSTOMY;  Surgeon: [name], MD;  Location: [facility];  Service: General;  Laterality: N/A;  . CYST EXCISION  2004   [place] of  mouth  . HEMORRHOID SURGERY     "lanced"  . LEFT HEART CATH AND CORONARY ANGIOGRAPHY N/A 09/24/2017   Procedure: LEFT HEART CATH AND CORONARY ANGIOGRAPHY;  Surgeon: Jettie Booze, MD;  Location: [facility];  Service: Cardiovascular;  Laterality: N/A;  . PARTIAL COLECTOMY N/A 01/31/2021   Procedure: PARTIAL COLECTOMY;  Surgeon: [name], MD;  Location: [facility];  Service: General;  Laterality: N/A;  . SHOULDER ARTHROSCOPY WITH ROTATOR CUFF REPAIR Right     Family History: Family History  Problem Relation Age of Onset  . Colon cancer Brother   . Dementia Mother   . CVA Father   . Sudden death Sister 66       in her sleep, was told a heart attack  . Heart disease Brother        CABG  . Heart disease Brother        CABG  . Heart disease Brother        CABG    Social History: Social History   Tobacco Use  Smoking Status Current Every Day Smoker  . Packs/day: 1.00  . Years: 34.00  . Pack years: 34.00  . Types: Cigarettes  Smokeless Tobacco Never Used   Social History   Substance and Sexual Activity  Alcohol Use No   Social History   Substance and Sexual Activity  Drug Use No    Allergies: Allergies  Allergen Reactions  . Bee Venom Anaphylaxis, Swelling and Other (See Comments)    Anaphylactic shock  . Penicillins Anaphylaxis and Swelling    Airway closes up Has patient had a PCN reaction causing immediate rash, facial/tongue/throat swelling, SOB or lightheadedness with hypotension: Yes Has patient had a PCN reaction causing severe rash involving mucus membranes or skin necrosis: Yes Has patient had a PCN reaction that required hospitalization: [name] Has patient had a PCN reaction occurring within the last 10 years: No If all of the above answers are "NO", then may proceed with Cephalosporin use.     Medications: Current Outpatient Medications  Medication Sig Dispense Refill  . ALPRAZolam (XANAX) 1 MG tablet Take 1 tablet (1 mg total) by  mouth 3 (three) times daily as needed for anxiety. 90 tablet 2  . cholecalciferol (VITAMIN D3) 25 MCG (1000 UNIT) tablet Take 1,000 Units by mouth daily.    Marland Kitchen lisinopril (ZESTRIL) 10 MG tablet TAKE 1 TABLET DAILY 90 tablet 0  . metFORMIN (GLUCOPHAGE) 500 MG tablet TAKE  (1)  TABLET TWICE A DAY WITH MEALS (BREAKFAST AND SUPPER) 180 tablet 0  . metoprolol tartrate (LOPRESSOR) 25 MG tablet TAKE  (1)  TABLET TWICE A DAY. 180 tablet 0  . Multiple Vitamins-Minerals (MENS 50+ MULTI VITAMIN/MIN PO) Take 1 tablet by mouth daily.    . Omega-3 Fatty Acids (FISH OIL) 1000 MG CAPS Take 1,000 mg by mouth daily.    Marland Kitchen oxyCODONE (OXY IR/ROXICODONE) 5 MG immediate release tablet Take 1 tablet (5 mg total) by mouth every 4 (four) hours as needed for severe pain or breakthrough pain. 30 tablet 0  . rosuvastatin (CRESTOR) 40 MG tablet TAKE 1 TABLET DAILY 90 tablet 0  . ondansetron (ZOFRAN-ODT) 4 MG disintegrating tablet Take 1 tablet (4 mg total) by mouth every 6 (six) hours as needed for nausea. (Patient  not taking: Reported on 03/10/2021) 20 tablet 0   No current facility-administered medications for this visit.    Review of Systems: GENERAL: negative for malaise, night sweats HEENT: No changes in hearing or vision, no nose bleeds or other nasal problems. NECK: Negative for lumps, goiter, pain and significant neck swelling RESPIRATORY: Negative for cough, wheezing CARDIOVASCULAR: Negative for chest pain, leg swelling, palpitations, orthopnea GI: SEE HPI MUSCULOSKELETAL: Negative for joint pain or swelling, back pain, and muscle pain. SKIN: Negative for lesions, rash PSYCH: Negative for sleep disturbance, mood disorder and recent psychosocial stressors. HEMATOLOGY Negative for prolonged bleeding, bruising easily, and swollen nodes. ENDOCRINE: Negative for cold or heat intolerance, polyuria, polydipsia and goiter. NEURO: negative for tremor, gait imbalance, syncope and seizures. The remainder of the review of  systems is noncontributory.   Physical Exam: BP 125/84 (BP Location: Left Arm, Patient Position: Sitting, Cuff Size: Large)   Pulse 99   Temp 99.2 F (37.3 C) (Oral)   Ht 5\' 9"  (1.753 m)   Wt 208 lb (94.3 kg)   BMI 30.72 kg/m  GENERAL: The patient is AO x3, in no acute distress. HEENT: Head is normocephalic and atraumatic. EOMI are intact. Mouth is well hydrated and without lesions. NECK: Supple. No masses LUNGS: Clear to auscultation. No presence of rhonchi/wheezing/rales. Adequate chest expansion HEART: RRR, normal s1 and s2. ABDOMEN: Soft, nontender, no guarding, no peritoneal signs, and nondistended. BS +. No masses. Has midline wound covered with gauze and drapes. Ostomy bag with brown stool and pink colored ostomy. EXTREMITIES: Without any cyanosis, clubbing, rash, lesions or edema. NEUROLOGIC: AOx3, no focal motor deficit. SKIN: no jaundice, no rashes   Imaging/Labs: as above  I personally reviewed and interpreted the available labs, imaging and endoscopic files.  Impression and Plan: Francisco Allen is a 60 y.o. male with past medical history of GERD, hyperlipidemia and recent complicated admission to the hospital due to colonic obstruction due to complicated diverticulitis and sigmoid stricture, who presents for evaluation of complicated diverticulitis and prior stricture.  Patient has presented progressive improvement after his recent hospitalization in which he required colonic resection.  I agree that he will need to have colonoscopy performed before he undergoes his colostomy closure.  He has completely recovered after his episode of acute diverticulitis.  Patient will be scheduled to have this performed, he understood and agreed.  - Schedule colonoscopy  All questions were answered.      Francisco Peppers, MD Gastroenterology and Hepatology [facility]

## 2021-03-18 ENCOUNTER — Other Ambulatory Visit: Payer: Self-pay

## 2021-03-18 ENCOUNTER — Ambulatory Visit (INDEPENDENT_AMBULATORY_CARE_PROVIDER_SITE_OTHER): Payer: No Typology Code available for payment source | Admitting: General Surgery

## 2021-03-18 ENCOUNTER — Encounter: Payer: Self-pay | Admitting: General Surgery

## 2021-03-18 VITALS — BP 137/83 | HR 89 | Temp 98.2°F | Resp 16 | Ht 69.0 in | Wt 213.0 lb

## 2021-03-18 DIAGNOSIS — T8130XA Disruption of wound, unspecified, initial encounter: Secondary | ICD-10-CM

## 2021-03-18 DIAGNOSIS — K56699 Other intestinal obstruction unspecified as to partial versus complete obstruction: Secondary | ICD-10-CM

## 2021-03-18 NOTE — Patient Instructions (Signed)
Wound care and ostomy care as you have been doing. Colonoscopy with Dr. Jenetta Downer.

## 2021-03-18 NOTE — Progress Notes (Signed)
[facility] Note   HPI:  60 y.o. Male presents to clinic for post-op follow-up evaluation of his end colostomy. Patient reports doing ok but is worried about the top part of his wound. He is having less leaks. He is still smoking and coughs and causes leaks.   Review of Systems:  No fever or chills Ostomy with stool All other review of systems: otherwise negative   Vital Signs:  BP 137/83   Pulse 89   Temp 98.2 F (36.8 C) (Oral)   Resp 16   Ht 5\' 9"  (1.753 m)   Wt 213 lb (96.6 kg)   SpO2 97%   BMI 31.45 kg/m    Physical Exam:  Physical Exam Vitals reviewed.  Cardiovascular:     Rate and Rhythm: Normal rate.  Pulmonary:     Effort: Pulmonary effort is normal.  Abdominal:     General: There is no distension.     Palpations: Abdomen is soft.     Comments: Appropriately tender, midline wound healing, less deep, minor fibrinous tissue at superior edge, no erythema or infection, granulation tissue, packing replaced, ostomy pink and stool in bag, bag replaced, abdominal wall shaved to help patient apply bag and dressing  Neurological:     Mental Status: He is alert.     Assessment:  60 y.o. yo Male with end colostomy in place and healing wound. Colonoscopy in the upcoming weeks. He is nervous about the prep but knows to be around the toilet.  Plan:  Wound care and ostomy care as you have been doing. Colonoscopy with Dr. Jenetta Downer. Counseled on smoking cessation to help with wound healing for colostomy reversal, discussed increased risk of leaks with smoking   Future Appointments  Date Time Provider [place]  03/31/2021  8:20 AM AP-SCREENING [facility] None  04/15/2021  9:15 AM [name], MD [facility] None     Curlene Labrum, MD [facility] [address] [place], [place] [address] [phone] (office)

## 2021-03-19 ENCOUNTER — Other Ambulatory Visit: Payer: Self-pay

## 2021-03-19 ENCOUNTER — Telehealth (INDEPENDENT_AMBULATORY_CARE_PROVIDER_SITE_OTHER): Payer: No Typology Code available for payment source | Admitting: General Surgery

## 2021-03-19 DIAGNOSIS — K56699 Other intestinal obstruction unspecified as to partial versus complete obstruction: Secondary | ICD-10-CM

## 2021-03-19 DIAGNOSIS — R809 Proteinuria, unspecified: Secondary | ICD-10-CM

## 2021-03-19 DIAGNOSIS — I10 Essential (primary) hypertension: Secondary | ICD-10-CM

## 2021-03-19 DIAGNOSIS — E782 Mixed hyperlipidemia: Secondary | ICD-10-CM

## 2021-03-19 DIAGNOSIS — T8130XA Disruption of wound, unspecified, initial encounter: Secondary | ICD-10-CM

## 2021-03-19 DIAGNOSIS — E1129 Type 2 diabetes mellitus with other diabetic kidney complication: Secondary | ICD-10-CM

## 2021-03-19 MED ORDER — OXYCODONE HCL 5 MG PO TABS: 5.0000 mg | ORAL_TABLET | ORAL | 0 refills | Status: DC | PRN

## 2021-03-19 NOTE — Telephone Encounter (Signed)
[facility]  Refill requested for pain meds.   Curlene Labrum, MD [facility] [address] [place], [place] [address] [phone] (office)

## 2021-03-31 ENCOUNTER — Other Ambulatory Visit (HOSPITAL_COMMUNITY)
Admission: RE | Admit: 2021-03-31 | Discharge: 2021-03-31 | Disposition: A | Discharge status: Home / Self Care | Payer: No Typology Code available for payment source | Source: Ambulatory Visit | Attending: Gastroenterology | Admitting: Gastroenterology

## 2021-03-31 ENCOUNTER — Other Ambulatory Visit: Payer: Self-pay

## 2021-03-31 ENCOUNTER — Encounter (INDEPENDENT_AMBULATORY_CARE_PROVIDER_SITE_OTHER): Payer: Self-pay

## 2021-03-31 ENCOUNTER — Other Ambulatory Visit (INDEPENDENT_AMBULATORY_CARE_PROVIDER_SITE_OTHER): Payer: Self-pay

## 2021-03-31 DIAGNOSIS — Z01812 Encounter for preprocedural laboratory examination: Secondary | ICD-10-CM | POA: Diagnosis not present

## 2021-03-31 DIAGNOSIS — Z20822 Contact with and (suspected) exposure to covid-19: Secondary | ICD-10-CM | POA: Diagnosis not present

## 2021-03-31 LAB — SARS CORONAVIRUS 2 (TAT 6-24 HRS): SARS Coronavirus 2: NEGATIVE

## 2021-04-08 ENCOUNTER — Telehealth (INDEPENDENT_AMBULATORY_CARE_PROVIDER_SITE_OTHER): Payer: No Typology Code available for payment source | Admitting: General Surgery

## 2021-04-08 DIAGNOSIS — K56699 Other intestinal obstruction unspecified as to partial versus complete obstruction: Secondary | ICD-10-CM

## 2021-04-08 MED ORDER — OXYCODONE HCL 5 MG PO TABS: 5.0000 mg | ORAL_TABLET | ORAL | 0 refills | Status: DC | PRN

## 2021-04-08 NOTE — Telephone Encounter (Signed)
[facility]  Refill done for roxicodone. Let us know if any other symptoms aside from the pain. Changes in wound, nausea, vomiting etc.  Curlene Labrum, MD [facility] [address] [place], [place] [address] [phone] (office)

## 2021-04-15 ENCOUNTER — Encounter: Payer: Self-pay | Admitting: General Surgery

## 2021-04-15 ENCOUNTER — Ambulatory Visit (INDEPENDENT_AMBULATORY_CARE_PROVIDER_SITE_OTHER): Payer: No Typology Code available for payment source | Admitting: General Surgery

## 2021-04-15 ENCOUNTER — Other Ambulatory Visit: Payer: Self-pay

## 2021-04-15 VITALS — BP 144/79 | HR 70 | Temp 99.0°F | Resp 18 | Ht 69.0 in | Wt 213.0 lb

## 2021-04-15 DIAGNOSIS — K56699 Other intestinal obstruction unspecified as to partial versus complete obstruction: Secondary | ICD-10-CM

## 2021-04-15 DIAGNOSIS — T8130XA Disruption of wound, unspecified, initial encounter: Secondary | ICD-10-CM

## 2021-04-15 NOTE — Patient Instructions (Signed)
Continue ostomy care and wound care.

## 2021-04-18 NOTE — Progress Notes (Signed)
[facility]   HPI:  60 y.o. Male presents to clinic for follow-up evaluation of his ostomy. Patient reports overall doing well and midline almost healed. Ready to get colonoscopy an reversed.   Review of Systems:  Regular Bms Eating  Gaining weight All other review of systems: otherwise negative   Vital Signs:  BP (!) 144/79   Pulse 70   Temp 99 F (37.2 C) (Other (Comment))   Resp 18   Ht 5\' 9"  (1.753 m)   Wt 213 lb (96.6 kg)   SpO2 96%   BMI 31.45 kg/m    Physical Exam:  Physical Exam Vitals reviewed.  Cardiovascular:     Rate and Rhythm: Normal rate.  Pulmonary:     Effort: Pulmonary effort is normal.  Abdominal:     General: There is no distension.     Palpations: Abdomen is soft.     Tenderness: There is abdominal tenderness.     Comments: Ostomy pink and stool in bag, wound with small area of granulation and no epithelization    Assessment:  60 y.o. yo Male with ostomy in place for sigmoid stenosis. Doing well.  Plan:  Continue ostomy care and wound care.  Getting colonoscopy 6/21 Future Appointments  Date Time Provider [place]  04/28/2021  2:40 PM Dettinger, Fransisca Kaufmann, MD [facility] None  05/13/2021  9:15 AM [name], MD [facility] None      Curlene Labrum, MD [facility] [address] [place], [place] [address] [phone] (office)

## 2021-04-23 IMAGING — DX DG HIP (WITH OR WITHOUT PELVIS) 2-3V*R*
3 series · 4 of 4 positions shown · non-contrast
Comparison: None.

CLINICAL DATA: Chronic right hip pain, worse over the past 1-2
days.

EXAM:
DG HIP (WITH OR WITHOUT PELVIS) 2-3V RIGHT

[Series 1: pelvis ap · 0.14mm/px · 2 of 2 slices shown]
[im 1/2]
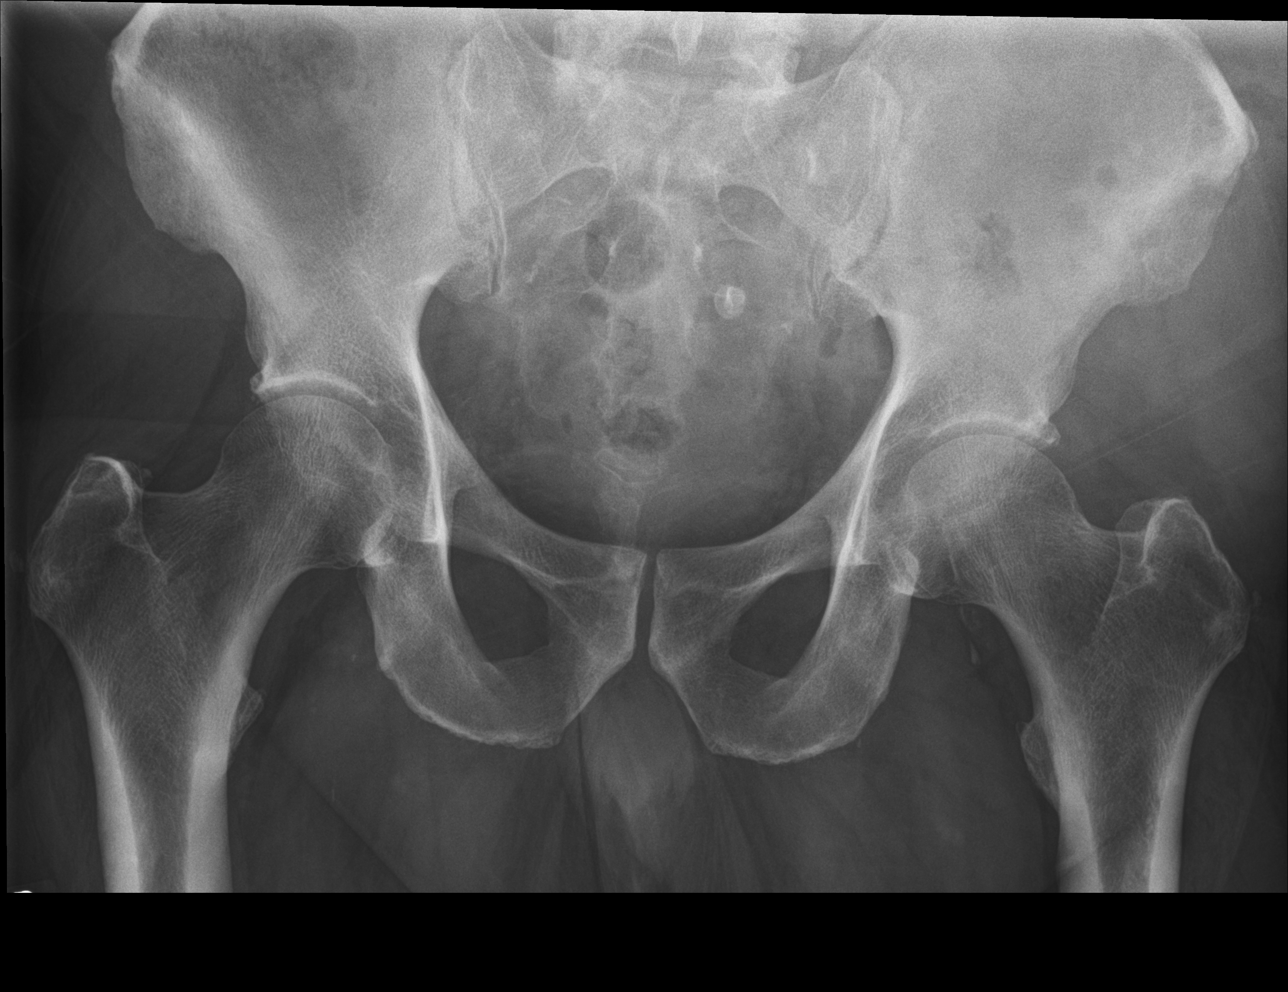
[im 2/2]
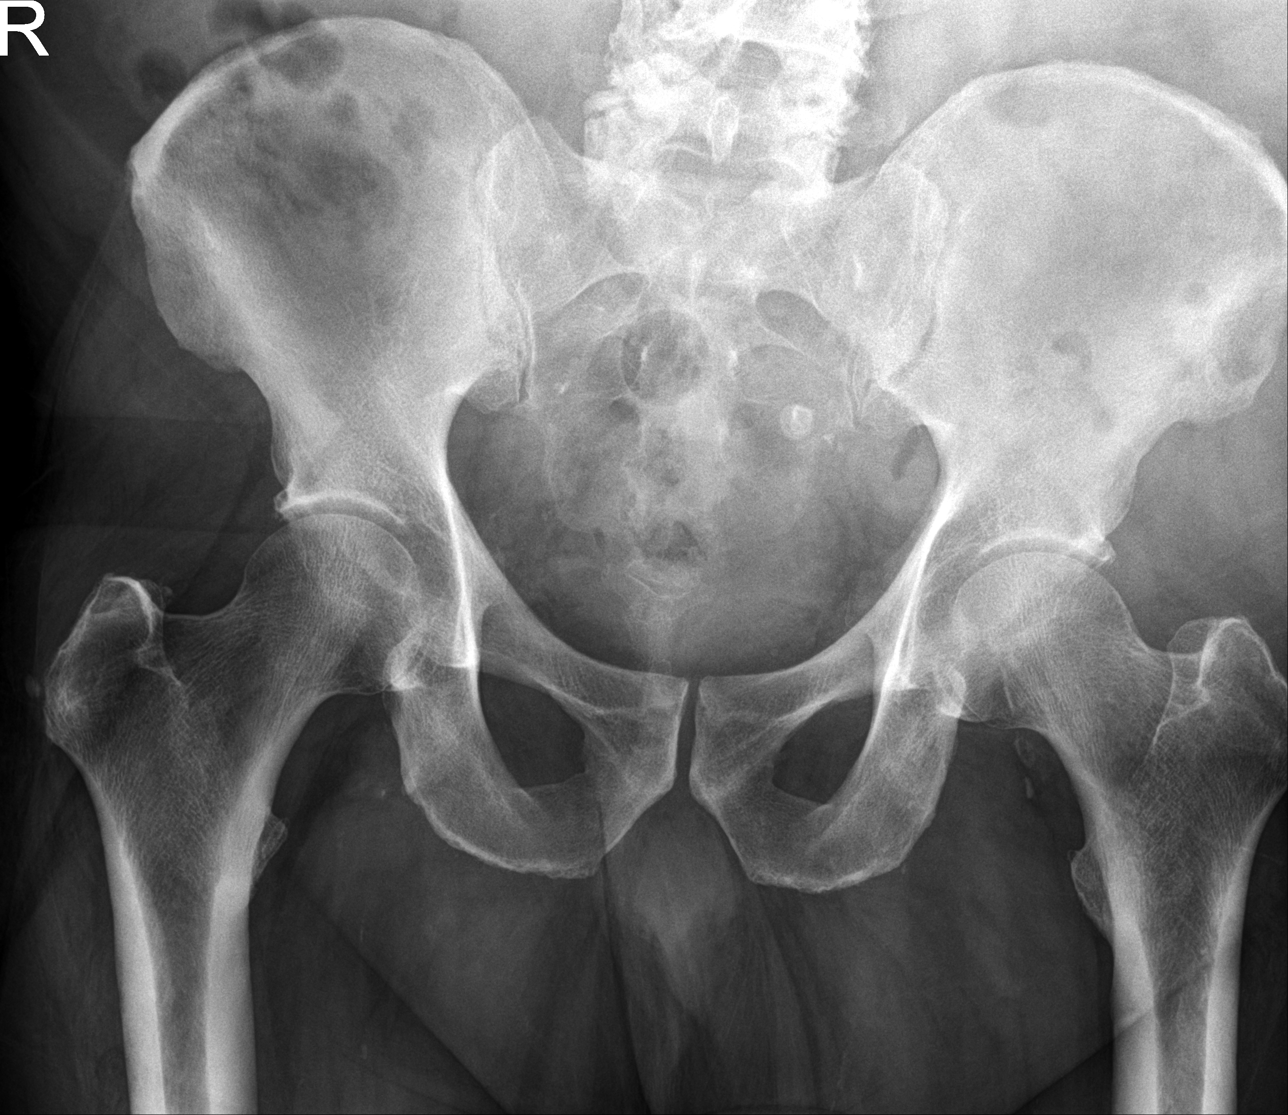

[hip ap]
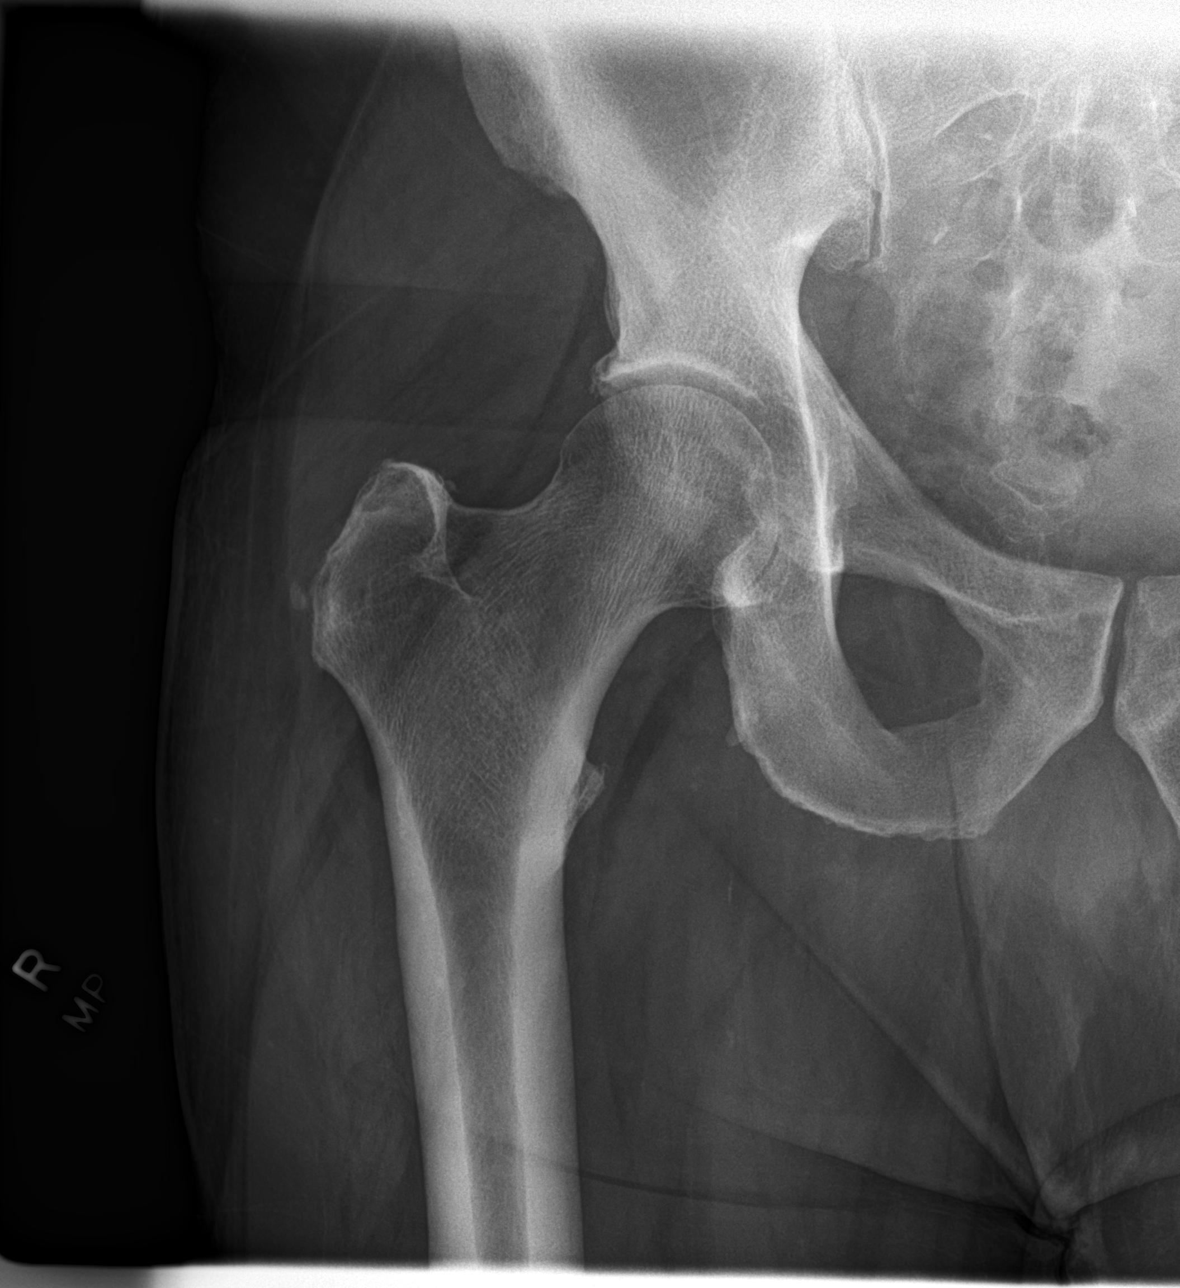

[hip lat]
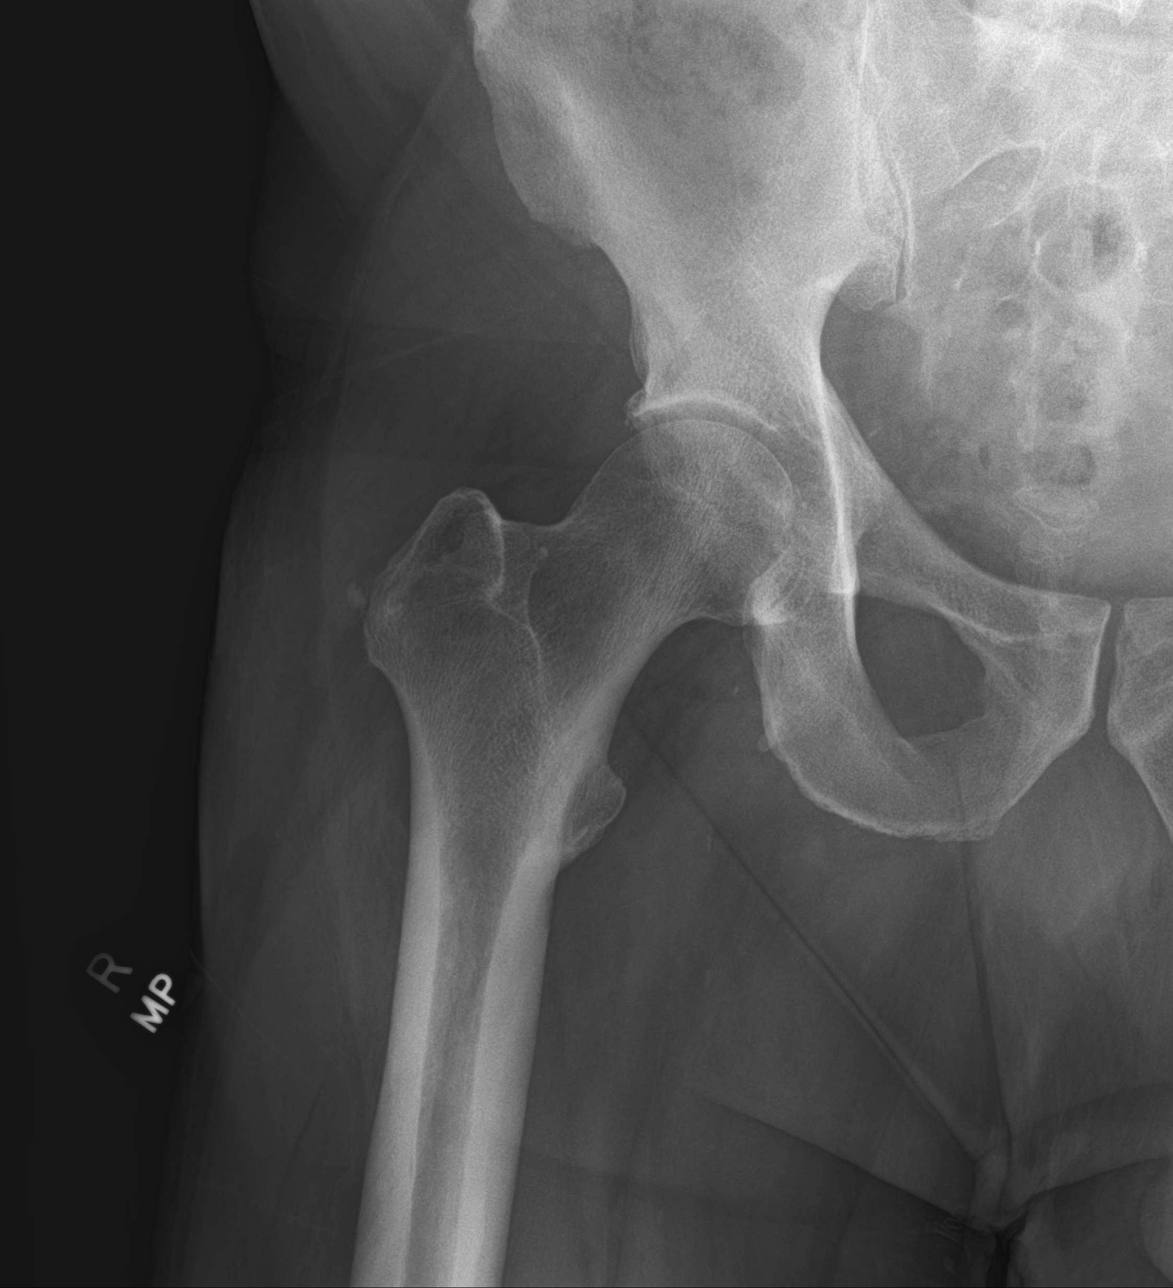

[4 of 4 positions shown; findings below may reference images not displayed]

FINDINGS: Cortical margins of the bony pelvis and right hip are intact. No
fracture. Both femoral heads are seated in the respective acetabula.
There is mild bilateral acetabular spurring with preservation of hip
joint spaces. No avascular necrosis, erosion, or evidence of focal
lesion. Minimal enthesopathic changes adjacent to the greater
trochanter.
IMPRESSION: 1. Mild osteoarthritis of both hips with acetabular spurring.
2. Minimal enthesopathic changes adjacent to the right greater
trochanter.

## 2021-04-24 ENCOUNTER — Other Ambulatory Visit: Payer: Self-pay | Admitting: Family Medicine

## 2021-04-24 DIAGNOSIS — F411 Generalized anxiety disorder: Secondary | ICD-10-CM

## 2021-04-24 NOTE — Telephone Encounter (Signed)
Dr Dettinger - please decline the refill for xanax - he has appt 6/20 with you for this

## 2021-04-28 ENCOUNTER — Encounter: Payer: Self-pay | Admitting: Family Medicine

## 2021-04-28 ENCOUNTER — Ambulatory Visit (INDEPENDENT_AMBULATORY_CARE_PROVIDER_SITE_OTHER): Payer: No Typology Code available for payment source | Admitting: Family Medicine

## 2021-04-28 ENCOUNTER — Other Ambulatory Visit: Payer: Self-pay

## 2021-04-28 ENCOUNTER — Other Ambulatory Visit (HOSPITAL_COMMUNITY): Payer: No Typology Code available for payment source

## 2021-04-28 ENCOUNTER — Other Ambulatory Visit: Payer: Self-pay | Admitting: Family Medicine

## 2021-04-28 VITALS — BP 132/84 | HR 88 | Ht 69.0 in | Wt 208.0 lb

## 2021-04-28 DIAGNOSIS — E1129 Type 2 diabetes mellitus with other diabetic kidney complication: Secondary | ICD-10-CM

## 2021-04-28 DIAGNOSIS — F411 Generalized anxiety disorder: Secondary | ICD-10-CM

## 2021-04-28 DIAGNOSIS — E782 Mixed hyperlipidemia: Secondary | ICD-10-CM

## 2021-04-28 DIAGNOSIS — I25118 Atherosclerotic heart disease of native coronary artery with other forms of angina pectoris: Secondary | ICD-10-CM

## 2021-04-28 DIAGNOSIS — I10 Essential (primary) hypertension: Secondary | ICD-10-CM | POA: Diagnosis not present

## 2021-04-28 DIAGNOSIS — E785 Hyperlipidemia, unspecified: Secondary | ICD-10-CM

## 2021-04-28 DIAGNOSIS — R809 Proteinuria, unspecified: Secondary | ICD-10-CM

## 2021-04-28 LAB — BAYER DCA HB A1C WAIVED: HB A1C (BAYER DCA - WAIVED): 5.5 % (ref <7.0)

## 2021-04-28 MED ORDER — ALPRAZOLAM 1 MG PO TABS: 1.0000 mg | ORAL_TABLET | Freq: Three times a day (TID) | ORAL | 2 refills | Status: DC | PRN

## 2021-04-28 MED ORDER — ROSUVASTATIN CALCIUM 40 MG PO TABS: 40.0000 mg | ORAL_TABLET | Freq: Every day | ORAL | 3 refills | Status: DC

## 2021-04-28 MED ORDER — METOPROLOL TARTRATE 25 MG PO TABS: 25.0000 mg | ORAL_TABLET | Freq: Two times a day (BID) | ORAL | 3 refills | Status: DC

## 2021-04-28 MED ORDER — LISINOPRIL 10 MG PO TABS
1.0000 | ORAL_TABLET | Freq: Every day | ORAL | 3 refills | Status: DC
Start: 2021-04-28 — End: 2021-07-31

## 2021-04-28 MED ORDER — METFORMIN HCL 500 MG PO TABS: 500.0000 mg | ORAL_TABLET | Freq: Two times a day (BID) | ORAL | 3 refills | Status: DC

## 2021-04-28 NOTE — Progress Notes (Signed)
BP 132/84   Pulse 88   Ht 5\' 9"  (1.753 m)   Wt 208 lb (94.3 kg)   SpO2 99%   BMI 30.72 kg/m    Subjective:   Patient ID: Francisco Allen, male    DOB: [birth year], 60 y.o.   MRN: [id number]  HPI: Francisco Allen is a 60 y.o. male presenting on 04/28/2021 for Medical Management of Chronic Issues and Diabetes   HPI Type 2 diabetes mellitus Patient comes in today for recheck of his diabetes. Patient has been currently taking metformin. Patient is currently on an ACE inhibitor/ARB. Patient has not seen an ophthalmologist this year. Patient denies any issues with their feet. The symptom started onset as an adult hyperlipidemia and hypertension ARE RELATED TO DM   Hyperlipidemia Patient is coming in for recheck of his hyperlipidemia. The patient is currently taking Crestor. They deny any issues with myalgias or history of liver damage from it. They deny any focal numbness or weakness or chest pain.   Hypertension Patient is currently on lisinopril and metoprolol, and their blood pressure today is 132/84. Patient denies any lightheadedness or dizziness. Patient denies headaches, blurred vision, chest pains, shortness of breath, or weakness. Denies any side effects from medication and is content with current medication.   Anxiety Current rx- alprazolam 1mg  tid prn # meds rx- 90 Effectiveness of current meds-works well Adverse reactions form meds-none  Pill count performed-No Last drug screen -08/17/2019 ( high risk q60m, moderate risk q44m, low risk yearly ) Urine drug screen today- Yes Was the [place] reviewed- yes  If yes were their any concerning findings? - none  No flowsheet data found.   Controlled substance contract signed on: today   Relevant past medical, surgical, family and social history reviewed and updated as indicated. Interim medical history since our last visit reviewed. Allergies and medications reviewed and updated.  Review of Systems  Constitutional:  Negative  for chills and fever.  Respiratory:  Negative for shortness of breath and wheezing.   Cardiovascular:  Negative for chest pain and leg swelling.  Musculoskeletal:  Negative for back pain and gait problem.  Skin:  Negative for rash.  All other systems reviewed and are negative.  Per HPI unless specifically indicated above   Allergies as of 04/28/2021       Reactions   Bee Venom Anaphylaxis, Swelling, Other (See Comments)   Anaphylactic shock   Penicillins Anaphylaxis, Swelling   Airway closes up Has patient had a PCN reaction causing immediate rash, facial/tongue/throat swelling, SOB or lightheadedness with hypotension: Yes Has patient had a PCN reaction causing severe rash involving mucus membranes or skin necrosis: Yes Has patient had a PCN reaction that required hospitalization: [name] Has patient had a PCN reaction occurring within the last 10 years: No If all of the above answers are "NO", then may proceed with Cephalosporin use.        Medication List        Accurate as of April 28, 2021  3:18 PM. If you have any questions, ask your nurse or doctor.          ALPRAZolam 1 MG tablet Commonly known as: XANAX Take 1 tablet (1 mg total) by mouth 3 (three) times daily as needed for anxiety.   bisacodyl 5 MG EC tablet Commonly known as: DULCOLAX Take 5 mg by mouth daily as needed for moderate constipation.   cholecalciferol 25 MCG (1000 UNIT) tablet Commonly known as: VITAMIN D3 Take 1,000 Units  by mouth daily.   Fish Oil 1000 MG Caps Take 1,000 mg by mouth daily.   lisinopril 10 MG tablet Commonly known as: ZESTRIL TAKE 1 TABLET DAILY   MENS 50+ MULTI VITAMIN/MIN PO Take 1 tablet by mouth daily.   metFORMIN 500 MG tablet Commonly known as: GLUCOPHAGE TAKE  (1)  TABLET TWICE A DAY WITH MEALS (BREAKFAST AND SUPPER) What changed: See the new instructions.   metoprolol tartrate 25 MG tablet Commonly known as: LOPRESSOR TAKE  (1)  TABLET TWICE A DAY. What  changed: See the new instructions.   ondansetron 4 MG disintegrating tablet Commonly known as: ZOFRAN-ODT Take 1 tablet (4 mg total) by mouth every 6 (six) hours as needed for nausea.   oxyCODONE 5 MG immediate release tablet Commonly known as: Oxy IR/ROXICODONE Take 1 tablet (5 mg total) by mouth every 4 (four) hours as needed for severe pain or breakthrough pain.   polyethylene glycol-electrolytes 420 g solution Commonly known as: TriLyte Take 4,000 mLs by mouth as directed.   rosuvastatin 40 MG tablet Commonly known as: CRESTOR TAKE 1 TABLET DAILY         Objective:   BP 132/84   Pulse 88   Ht 5\' 9"  (1.753 m)   Wt 208 lb (94.3 kg)   SpO2 99%   BMI 30.72 kg/m   Wt Readings from Last 3 Encounters:  04/28/21 208 lb (94.3 kg)  04/15/21 213 lb (96.6 kg)  03/18/21 213 lb (96.6 kg)    Physical Exam Vitals and nursing note reviewed.  Constitutional:      General: He is not in acute distress.    Appearance: He is well-developed. He is not diaphoretic.  Eyes:     General: No scleral icterus.    Conjunctiva/sclera: Conjunctivae normal.  Neck:     Thyroid: No thyromegaly.  Cardiovascular:     Rate and Rhythm: Normal rate and regular rhythm.     Heart sounds: Normal heart sounds. No murmur heard. Pulmonary:     Effort: Pulmonary effort is normal. No respiratory distress.     Breath sounds: Normal breath sounds. No wheezing.  Musculoskeletal:        General: Normal range of motion.     Cervical back: Neck supple.  Lymphadenopathy:     Cervical: No cervical adenopathy.  Skin:    General: Skin is warm and dry.     Findings: No rash.  Neurological:     Mental Status: He is alert and oriented to person, place, and time.     Coordination: Coordination normal.  Psychiatric:        Behavior: Behavior normal.      Assessment & Plan:   Problem List Items Addressed This Visit       Cardiovascular and Mediastinum   HTN (hypertension)   Relevant Medications    lisinopril (ZESTRIL) 10 MG tablet   metoprolol tartrate (LOPRESSOR) 25 MG tablet   rosuvastatin (CRESTOR) 40 MG tablet     Endocrine   Controlled type 2 diabetes mellitus with microalbuminuria (HCC) - Primary   Relevant Medications   lisinopril (ZESTRIL) 10 MG tablet   metFORMIN (GLUCOPHAGE) 500 MG tablet   rosuvastatin (CRESTOR) 40 MG tablet     Other   Hyperlipidemia   Relevant Medications   lisinopril (ZESTRIL) 10 MG tablet   metoprolol tartrate (LOPRESSOR) 25 MG tablet   rosuvastatin (CRESTOR) 40 MG tablet   GAD (generalized anxiety disorder)   Relevant Medications   ALPRAZolam (XANAX) 1  MG tablet   Other Relevant Orders   ToxASSURE Select 13 (MW), Urine   Other Visit Diagnoses     Coronary artery disease of native artery of native heart with stable angina pectoris (HCC)       Relevant Medications   lisinopril (ZESTRIL) 10 MG tablet   metoprolol tartrate (LOPRESSOR) 25 MG tablet   rosuvastatin (CRESTOR) 40 MG tablet       A1c is 5.5 which looks good, continue current medicine, no changes.  He is doing better with his bowels and they are starting to heal up.  He continues to follow with general surgery. Follow up plan: Return in about 3 months (around 07/29/2021), or if symptoms worsen or fail to improve, for Hypertension and cholesterol and diabetes and anxiety.  Counseling provided for all of the vaccine components No orders of the defined types were placed in this encounter.   Caryl Pina, MD [facility] 04/28/2021, 3:18 PM

## 2021-04-29 ENCOUNTER — Ambulatory Visit (HOSPITAL_COMMUNITY): Payer: No Typology Code available for payment source | Admitting: Anesthesiology

## 2021-04-29 ENCOUNTER — Other Ambulatory Visit: Payer: Self-pay

## 2021-04-29 ENCOUNTER — Encounter (HOSPITAL_COMMUNITY)
Admission: RE | Disposition: A | Discharge status: Home / Self Care | Payer: Self-pay | Source: Home / Self Care | Attending: Gastroenterology

## 2021-04-29 ENCOUNTER — Ambulatory Visit (HOSPITAL_COMMUNITY)
Admission: RE | Admit: 2021-04-29 | Discharge: 2021-04-29 | Disposition: A | Discharge status: Home / Self Care | Payer: No Typology Code available for payment source | Attending: Gastroenterology | Admitting: Gastroenterology

## 2021-04-29 ENCOUNTER — Encounter (HOSPITAL_COMMUNITY): Payer: Self-pay | Admitting: Gastroenterology

## 2021-04-29 DIAGNOSIS — Z7984 Long term (current) use of oral hypoglycemic drugs: Secondary | ICD-10-CM | POA: Diagnosis not present

## 2021-04-29 DIAGNOSIS — K648 Other hemorrhoids: Secondary | ICD-10-CM | POA: Insufficient documentation

## 2021-04-29 DIAGNOSIS — K219 Gastro-esophageal reflux disease without esophagitis: Secondary | ICD-10-CM | POA: Insufficient documentation

## 2021-04-29 DIAGNOSIS — N289 Disorder of kidney and ureter, unspecified: Secondary | ICD-10-CM

## 2021-04-29 DIAGNOSIS — K5730 Diverticulosis of large intestine without perforation or abscess without bleeding: Secondary | ICD-10-CM | POA: Insufficient documentation

## 2021-04-29 DIAGNOSIS — Z79899 Other long term (current) drug therapy: Secondary | ICD-10-CM | POA: Insufficient documentation

## 2021-04-29 DIAGNOSIS — D122 Benign neoplasm of ascending colon: Secondary | ICD-10-CM | POA: Insufficient documentation

## 2021-04-29 DIAGNOSIS — D123 Benign neoplasm of transverse colon: Secondary | ICD-10-CM | POA: Insufficient documentation

## 2021-04-29 DIAGNOSIS — Z01818 Encounter for other preprocedural examination: Secondary | ICD-10-CM | POA: Insufficient documentation

## 2021-04-29 DIAGNOSIS — R7303 Prediabetes: Secondary | ICD-10-CM | POA: Insufficient documentation

## 2021-04-29 DIAGNOSIS — Z91030 Bee allergy status: Secondary | ICD-10-CM | POA: Insufficient documentation

## 2021-04-29 DIAGNOSIS — K621 Rectal polyp: Secondary | ICD-10-CM | POA: Diagnosis not present

## 2021-04-29 DIAGNOSIS — Z8249 Family history of ischemic heart disease and other diseases of the circulatory system: Secondary | ICD-10-CM | POA: Insufficient documentation

## 2021-04-29 DIAGNOSIS — F17210 Nicotine dependence, cigarettes, uncomplicated: Secondary | ICD-10-CM | POA: Diagnosis not present

## 2021-04-29 DIAGNOSIS — Z880 Allergy status to penicillin: Secondary | ICD-10-CM | POA: Insufficient documentation

## 2021-04-29 HISTORY — PX: POLYPECTOMY: SHX5525

## 2021-04-29 HISTORY — PX: COLONOSCOPY WITH PROPOFOL: SHX5780

## 2021-04-29 LAB — CMP14+EGFR
ALT: 14 IU/L (ref 0–44)
AST: 13 IU/L (ref 0–40)
Albumin/Globulin Ratio: 1.5 (ref 1.2–2.2)
Albumin: 4.4 g/dL (ref 3.8–4.8)
Alkaline Phosphatase: 128 IU/L — ABNORMAL HIGH (ref 44–121)
BUN/Creatinine Ratio: 19 (ref 10–24)
BUN: 35 mg/dL — ABNORMAL HIGH (ref 8–27)
Bilirubin Total: 0.8 mg/dL (ref 0.0–1.2)
CO2: 23 mmol/L (ref 20–29)
Calcium: 9.6 mg/dL (ref 8.6–10.2)
Chloride: 100 mmol/L (ref 96–106)
Creatinine, Ser: 1.86 mg/dL — ABNORMAL HIGH (ref 0.76–1.27)
Globulin, Total: 2.9 g/dL (ref 1.5–4.5)
Glucose: 102 mg/dL — ABNORMAL HIGH (ref 65–99)
Potassium: 5.4 mmol/L — ABNORMAL HIGH (ref 3.5–5.2)
Sodium: 135 mmol/L (ref 134–144)
Total Protein: 7.3 g/dL (ref 6.0–8.5)
eGFR: 40 mL/min/{1.73_m2} — ABNORMAL LOW (ref >59)

## 2021-04-29 LAB — LIPID PANEL
Chol/HDL Ratio: 3.3 ratio (ref 0.0–5.0)
Cholesterol, Total: 108 mg/dL (ref 100–199)
HDL: 33 mg/dL — ABNORMAL LOW (ref >39)
LDL Chol Calc (NIH): 45 mg/dL (ref 0–99)
Triglycerides: 185 mg/dL — ABNORMAL HIGH (ref 0–149)
VLDL Cholesterol Cal: 30 mg/dL (ref 5–40)

## 2021-04-29 LAB — CBC WITH DIFFERENTIAL/PLATELET
Basophils Absolute: 0.1 x10E3/uL (ref 0.0–0.2)
Basos: 1 %
EOS (ABSOLUTE): 0.3 x10E3/uL (ref 0.0–0.4)
Eos: 3 %
Hematocrit: 39.5 % (ref 37.5–51.0)
Hemoglobin: 12.8 g/dL — ABNORMAL LOW (ref 13.0–17.7)
Immature Grans (Abs): 0.1 x10E3/uL (ref 0.0–0.1)
Immature Granulocytes: 1 %
Lymphocytes Absolute: 2.1 x10E3/uL (ref 0.7–3.1)
Lymphs: 19 %
MCH: 27.4 pg (ref 26.6–33.0)
MCHC: 32.4 g/dL (ref 31.5–35.7)
MCV: 84 fL (ref 79–97)
Monocytes Absolute: 0.9 x10E3/uL (ref 0.1–0.9)
Monocytes: 8 %
Neutrophils Absolute: 7.6 x10E3/uL — ABNORMAL HIGH (ref 1.4–7.0)
Neutrophils: 68 %
Platelets: 206 x10E3/uL (ref 150–450)
RBC: 4.68 x10E6/uL (ref 4.14–5.80)
RDW: 15.4 % (ref 11.6–15.4)
WBC: 11.1 x10E3/uL — ABNORMAL HIGH (ref 3.4–10.8)

## 2021-04-29 LAB — GLUCOSE, CAPILLARY: Glucose-Capillary: 113 mg/dL — ABNORMAL HIGH (ref 70–99)

## 2021-04-29 LAB — HM COLONOSCOPY

## 2021-04-29 SURGERY — COLONOSCOPY WITH PROPOFOL
Anesthesia: General

## 2021-04-29 MED ORDER — PROPOFOL 10 MG/ML IV BOLUS
INTRAVENOUS | Status: DC | PRN
  Administered 2021-04-29: 50 mg via INTRAVENOUS
  Administered 2021-04-29: 30 mg via INTRAVENOUS
  Administered 2021-04-29: 50 mg via INTRAVENOUS
  Administered 2021-04-29: 100 mg via INTRAVENOUS
  Administered 2021-04-29: 50 mg via INTRAVENOUS

## 2021-04-29 MED ORDER — LIDOCAINE HCL (CARDIAC) PF 100 MG/5ML IV SOSY
PREFILLED_SYRINGE | INTRAVENOUS | Status: DC | PRN
  Administered 2021-04-29: 50 mg via INTRAVENOUS

## 2021-04-29 MED ORDER — LACTATED RINGERS IV SOLN
INTRAVENOUS | Status: DC
  Administered 2021-04-29: 1000 mL via INTRAVENOUS

## 2021-04-29 MED ORDER — PROPOFOL 500 MG/50ML IV EMUL
INTRAVENOUS | Status: DC | PRN
  Administered 2021-04-29: 150 ug/kg/min via INTRAVENOUS

## 2021-04-29 NOTE — Anesthesia Postprocedure Evaluation (Signed)
Anesthesia Post Note  Patient: Francisco Allen  Procedure(s) Performed: COLONOSCOPY WITH PROPOFOL POLYPECTOMY  Patient location during evaluation: Endoscopy Anesthesia Type: General Level of consciousness: awake and alert and oriented Pain management: pain level controlled Vital Signs Assessment: post-procedure vital signs reviewed and stable Cardiovascular status: blood pressure returned to baseline and stable Postop Assessment: no apparent nausea or vomiting Anesthetic complications: no   No notable events documented.   Last Vitals:  Vitals:   04/29/21 0753 04/29/21 0942  BP: 115/81 92/74  Pulse: 94   Resp: 20 16  Temp: 36.6 C 36.6 C  SpO2: 100% 100%    Last Pain:  Vitals:   04/29/21 0942  TempSrc: Oral  PainSc: 0-No pain                 Jeanann Balinski C Aulton Routt

## 2021-04-29 NOTE — Discharge Instructions (Addendum)
You are being discharged to home.  Resume your previous diet.  We are waiting for your pathology results.  Your physician has recommended a repeat colonoscopy in three years for surveillance.  

## 2021-04-29 NOTE — H&P (Signed)
Francisco Allen is an 60 y.o. male.   Chief Complaint: Colonoscopy prior to surgical repair HPI: Francisco Allen is a 60 y.o. male with past medical history of GERD, hyperlipidemia and recent complicated admission to the hospital due to colonic obstruction due to complicated diverticulitis and sigmoid stricture, who presents for colonoscopy prior to surgical repair.  The patient has felt well, denies any complaints at the moment.  States that he has felt well and is in better spirits.  Denies any nausea, vomiting, fever, chills, melena or hematochezia.  Has had an adequate output through his ostomy bag.   Last Colonoscopy: 03/03/2017, scope was advanced to the cecum, a 5 mm polyp was resected from the sigmoid colon (pathology consistent with hyperplastic polyp), he also had severe sigmoid stenosis and presence of multiple diverticulosis in the left colon, internal hemorrhoids were found.  Past Medical History:  Diagnosis Date   Arthritis    "all over my body I think" (09/23/2017)   Colon polyp    Diverticulitis    GERD (gastroesophageal reflux disease)    hx   Hyperlipidemia    Large bowel obstruction ([place]) 01/31/2021   Pre-diabetes     Past Surgical History:  Procedure Laterality Date   ARTHROSCOPY KNEE W/ DRILLING Left 10/25/2020   COLONOSCOPY  X ~ 2   "brother passed away w/colon cancer; I've probably had 4 colonoscopies done since he passed" (09/23/2017)   COLONOSCOPY W/ BIOPSIES AND POLYPECTOMY  X 2   COLOSTOMY N/A 01/31/2021   Procedure: COLOSTOMY;  Surgeon: [name], MD;  Location: [facility];  Service: General;  Laterality: N/A;   CYST EXCISION  2004   Roof of mouth   HEMORRHOID SURGERY     "lanced"   LEFT HEART CATH AND CORONARY ANGIOGRAPHY N/A 09/24/2017   Procedure: LEFT HEART CATH AND CORONARY ANGIOGRAPHY;  Surgeon: Jettie Booze, MD;  Location: [facility];  Service: Cardiovascular;  Laterality: N/A;   PARTIAL COLECTOMY N/A 01/31/2021   Procedure:  PARTIAL COLECTOMY;  Surgeon: [name], MD;  Location: [facility];  Service: General;  Laterality: N/A;   SHOULDER ARTHROSCOPY WITH ROTATOR CUFF REPAIR Right     Family History  Problem Relation Age of Onset   Colon cancer Brother    Dementia Mother    CVA Father    Sudden death Sister 28       in her sleep, was told a heart attack   Heart disease Brother        CABG   Heart disease Brother        CABG   Heart disease Brother        CABG   Social History:  reports that he has been smoking cigarettes. He has a 34.00 pack-year smoking history. He has never used smokeless tobacco. He reports that he does not drink alcohol and does not use drugs.  Allergies:  Allergies  Allergen Reactions   Bee Venom Anaphylaxis, Swelling and Other (See Comments)    Anaphylactic shock   Penicillins Anaphylaxis and Swelling    Airway closes up Has patient had a PCN reaction causing immediate rash, facial/tongue/throat swelling, SOB or lightheadedness with hypotension: Yes Has patient had a PCN reaction causing severe rash involving mucus membranes or skin necrosis: Yes Has patient had a PCN reaction that required hospitalization: [name] Has patient had a PCN reaction occurring within the last 10 years: No If all of the above answers are "NO", then may proceed with  Cephalosporin use.     Medications Prior to Admission  Medication Sig Dispense Refill   ALPRAZolam (XANAX) 1 MG tablet Take 1 tablet (1 mg total) by mouth 3 (three) times daily as needed for anxiety. 90 tablet 2   bisacodyl (DULCOLAX) 5 MG EC tablet Take 5 mg by mouth daily as needed for moderate constipation.     cholecalciferol (VITAMIN D3) 25 MCG (1000 UNIT) tablet Take 1,000 Units by mouth daily.     lisinopril (ZESTRIL) 10 MG tablet Take 1 tablet (10 mg total) by mouth daily. 90 tablet 3   metFORMIN (GLUCOPHAGE) 500 MG tablet Take 1 tablet (500 mg total) by mouth 2 (two) times daily with a meal. TAKE  (1)  TABLET TWICE A DAY WITH  MEALS (BREAKFAST AND SUPPER) 180 tablet 3   metoprolol tartrate (LOPRESSOR) 25 MG tablet Take 1 tablet (25 mg total) by mouth 2 (two) times daily. TAKE  (1)  TABLET TWICE A DAY. 180 tablet 3   Multiple Vitamins-Minerals (MENS 50+ MULTI VITAMIN/MIN PO) Take 1 tablet by mouth daily.     Omega-3 Fatty Acids (FISH OIL) 1000 MG CAPS Take 1,000 mg by mouth daily.     oxyCODONE (OXY IR/ROXICODONE) 5 MG immediate release tablet Take 1 tablet (5 mg total) by mouth every 4 (four) hours as needed for severe pain or breakthrough pain. 30 tablet 0   polyethylene glycol-electrolytes (TRILYTE) 420 g solution Take 4,000 mLs by mouth as directed. 4000 mL 0   rosuvastatin (CRESTOR) 40 MG tablet Take 1 tablet (40 mg total) by mouth daily. 90 tablet 3   ondansetron (ZOFRAN-ODT) 4 MG disintegrating tablet Take 1 tablet (4 mg total) by mouth every 6 (six) hours as needed for nausea. (Patient not taking: No sig reported) 20 tablet 0    Results for orders placed or performed in visit on 03/19/21 (from the past 48 hour(s))  CMP14+EGFR     Status: Abnormal   Collection Time: 04/28/21  3:17 PM  Result Value Ref Range   Glucose 102 (H) 65 - 99 mg/dL   BUN 35 (H) 8 - 27 mg/dL   Creatinine, Ser 1.86 (H) 0.76 - 1.27 mg/dL   eGFR 40 (L) >59 mL/min/1.73   BUN/Creatinine Ratio 19 10 - 24   Sodium 135 134 - 144 mmol/L   Potassium 5.4 (H) 3.5 - 5.2 mmol/L   Chloride 100 96 - 106 mmol/L   CO2 23 20 - 29 mmol/L   Calcium 9.6 8.6 - 10.2 mg/dL   Total Protein 7.3 6.0 - 8.5 g/dL   Albumin 4.4 3.8 - 4.8 g/dL   Globulin, Total 2.9 1.5 - 4.5 g/dL   Albumin/Globulin Ratio 1.5 1.2 - 2.2   Bilirubin Total 0.8 0.0 - 1.2 mg/dL   Alkaline Phosphatase 128 (H) 44 - 121 IU/L   AST 13 0 - 40 IU/L   ALT 14 0 - 44 IU/L  CBC with Differential/Platelet     Status: Abnormal   Collection Time: 04/28/21  3:17 PM  Result Value Ref Range   WBC 11.1 (H) 3.4 - 10.8 x10E3/uL   RBC 4.68 4.14 - 5.80 x10E6/uL   Hemoglobin 12.8 (L) 13.0 - 17.7 g/dL    Hematocrit 39.5 37.5 - 51.0 %   MCV 84 79 - 97 fL   MCH 27.4 26.6 - 33.0 pg   MCHC 32.4 31.5 - 35.7 g/dL   RDW 15.4 11.6 - 15.4 %   Platelets 206 150 - 450 x10E3/uL   Neutrophils  68 Not Estab. %   Lymphs 19 Not Estab. %   Monocytes 8 Not Estab. %   Eos 3 Not Estab. %   Basos 1 Not Estab. %   Neutrophils Absolute 7.6 (H) 1.4 - 7.0 x10E3/uL   Lymphocytes Absolute 2.1 0.7 - 3.1 x10E3/uL   Monocytes Absolute 0.9 0.1 - 0.9 x10E3/uL   EOS (ABSOLUTE) 0.3 0.0 - 0.4 x10E3/uL   Basophils Absolute 0.1 0.0 - 0.2 x10E3/uL   Immature Granulocytes 1 Not Estab. %   Immature Grans (Abs) 0.1 0.0 - 0.1 x10E3/uL  Lipid panel     Status: Abnormal   Collection Time: 04/28/21  3:17 PM  Result Value Ref Range   Cholesterol, Total 108 100 - 199 mg/dL   Triglycerides 185 (H) 0 - 149 mg/dL   HDL 33 (L) >39 mg/dL   VLDL Cholesterol Cal 30 5 - 40 mg/dL   LDL Chol Calc (NIH) 45 0 - 99 mg/dL   Chol/HDL Ratio 3.3 0.0 - 5.0 ratio    Comment:                                   T. Chol/HDL Ratio                                             Men  Women                               1/2 Avg.Risk  3.4    3.3                                   Avg.Risk  5.0    4.4                                2X Avg.Risk  9.6    7.1                                3X Avg.Risk 23.4   11.0    No results found.  Review of Systems  Constitutional: Negative.   HENT: Negative.    Eyes: Negative.   Respiratory: Negative.    Cardiovascular: Negative.   Gastrointestinal: Negative.   Endocrine: Negative.   Genitourinary: Negative.   Musculoskeletal: Negative.   Skin: Negative.   Allergic/Immunologic: Negative.   Neurological: Negative.   Hematological: Negative.   Psychiatric/Behavioral: Negative.     Blood pressure 115/81, pulse 94, temperature 97.8 F (36.6 C), temperature source Oral, resp. rate 20, SpO2 100 %. Physical Exam  GENERAL: The patient is AO x3, in no acute distress. HEENT: Head is normocephalic and atraumatic.  EOMI are intact. Mouth is well hydrated and without lesions. NECK: Supple. No masses LUNGS: Clear to auscultation. No presence of rhonchi/wheezing/rales. Adequate chest expansion HEART: RRR, normal s1 and s2. ABDOMEN: Soft, nontender, no guarding, no peritoneal signs, and nondistended. BS +. No masses. Has midline wound covered with gauze and drapes. Ostomy bag with brown stool and pink colored ostomy. EXTREMITIES: Without any cyanosis, clubbing, rash, lesions or edema. NEUROLOGIC: AOx3, no  focal motor deficit. SKIN: no jaundice, no rashes  Assessment/Plan Francisco Allen is a 61 y.o. male with past medical history of GERD, hyperlipidemia and recent complicated admission to the hospital due to colonic obstruction due to complicated diverticulitis and sigmoid stricture, who presents for colonoscopy prior to surgical repair.  We will proceed with colonoscopy.  Harvel Quale, MD 04/29/2021, 8:07 AM

## 2021-04-29 NOTE — Anesthesia Preprocedure Evaluation (Signed)
Anesthesia Evaluation  Patient identified by MRN, date of birth, ID band Patient awake    Reviewed: Allergy & Precautions, NPO status , Patient's Chart, lab work & pertinent test results  Airway Mallampati: II  TM Distance: >3 FB Neck ROM: Full    Dental  (+) Dental Advisory Given, Missing   Pulmonary Current Smoker and Patient abstained from smoking.,    Pulmonary exam normal breath sounds clear to auscultation       Cardiovascular Exercise Tolerance: Good hypertension, Pt. on medications and Pt. on home beta blockers Normal cardiovascular exam Rhythm:Regular Rate:Normal     Neuro/Psych  Headaches, PSYCHIATRIC DISORDERS Anxiety    GI/Hepatic Neg liver ROS, GERD  Medicated and Controlled,Partial colectomy/colostomy   Endo/Other  diabetes, Well Controlled, Type 2, Oral Hypoglycemic Agents  Renal/GU      Musculoskeletal  (+) Arthritis , Osteoarthritis,    Abdominal   Peds  Hematology negative hematology ROS (+)   Anesthesia Other Findings   Reproductive/Obstetrics                             Anesthesia Physical Anesthesia Plan  ASA: 2  Anesthesia Plan: General   Post-op Pain Management:    Induction: Intravenous  PONV Risk Score and Plan: Propofol infusion  Airway Management Planned: Nasal Cannula and Natural Airway  Additional Equipment:   Intra-op Plan:   Post-operative Plan:   Informed Consent: I have reviewed the patients History and Physical, chart, labs and discussed the procedure including the risks, benefits and alternatives for the proposed anesthesia with the patient or authorized representative who has indicated his/her understanding and acceptance.     Dental advisory given  Plan Discussed with: CRNA  Anesthesia Plan Comments:         Anesthesia Quick Evaluation

## 2021-04-29 NOTE — Op Note (Signed)
[facility] Patient Name: Francisco Allen Procedure Date: 04/29/2021 8:54 AM MRN: [id number] Date of Birth: 1961/10/06 Attending MD: Maylon Peppers ,  CSN: [id number] Age: 60 Admit Type: Outpatient Procedure:                Colonoscopy Indications:              Preoperative assessment Providers:                Maylon Peppers, [place] Sharon Seller, RN, Raphael Gibney, Technician Referring MD:              Medicines:                Monitored Anesthesia Care Complications:            No immediate complications. Estimated Blood Loss:     Estimated blood loss: none. Procedure:                Pre-Anesthesia Assessment:                           - Prior to the procedure, a History and Physical                            was performed, and patient medications, allergies                            and sensitivities were reviewed. The patient's                            tolerance of previous anesthesia was reviewed.                           - The risks and benefits of the procedure and the                            sedation options and risks were discussed with the                            patient. All questions were answered and informed                            consent was obtained.                           - ASA Grade Assessment: II - A patient with mild                            systemic disease.                           After obtaining informed consent, the colonoscope                            was passed under direct vision. Throughout the  procedure, the patient's blood pressure, pulse, and                            oxygen saturations were monitored continuously. The                            [id number]-[id number] ([id number]) scope was introduced through                            the colostomy opening and advanced to the the                            cecum, identified by appendiceal orifice and                             ileocecal valve. After the scope was withdrawn, the                            scope was reinserted through the anus to inspect                            the remaining colon. The colonoscopy was performed                            without difficulty. The patient tolerated the                            procedure well. The quality of the bowel                            preparation was good. Scope In: 9:06:13 AM Scope Out: 9:38:23 AM Scope Withdrawal Time: 0 hours 29 minutes 53 seconds  Total Procedure Duration: 0 hours 32 minutes 10 seconds  Findings:      A 10 mm polyp was found in the ascending colon. The polyp was       semi-sessile. The polyp was removed with a cold snare. Resection and       retrieval were complete.      A 2 mm polyp was found in the transverse colon. The polyp was sessile.       The polyp was removed with a cold biopsy forceps. Resection and       retrieval were complete.      A few small and large-mouthed diverticula were found in the descending       colon.      The perianal and digital rectal examinations were normal.      The area between 20 cm proximal to the anus appeared normal. There was       presence of a healthy Hartmann blind pouch.      A 4 mm polyp was found in the rectum. The polyp was sessile. The polyp       was removed with a cold snare. Resection was complete but not retrieved.      Non-bleeding internal hemorrhoids were found during retroflexion. The       hemorrhoids were small. Impression:               -  One 10 mm polyp in the ascending colon, removed                            with a cold snare. Resected and retrieved.                           - One 2 mm polyp in the transverse colon, removed                            with a cold biopsy forceps. Resected and retrieved.                           - Diverticulosis in the descending colon.                           - The colon between 20 cm proximal to the anus is                             normal.                           - One 4 mm polyp in the rectum, removed with a cold                            snare. Resected but not retrieved.                           - Non-bleeding internal hemorrhoids. Moderate Sedation:      Per Anesthesia Care Recommendation:           - Discharge patient to home (ambulatory).                           - Resume previous diet.                           - Await pathology results.                           - Repeat colonoscopy in 3 years for surveillance. Procedure Code(s):        --- Professional ---                           [phone], Colonoscopy, flexible; with removal of                            tumor(s), polyp(s), or other lesion(s) by snare                            technique                           93818, 28, Colonoscopy, flexible; with biopsy,                            single or  multiple Diagnosis Code(s):        --- Professional ---                           K63.5, Polyp of colon                           K62.1, Rectal polyp                           K64.8, Other hemorrhoids                           Z01.818, Encounter for other preprocedural                            examination                           K57.30, Diverticulosis of large intestine without                            perforation or abscess without bleeding CPT copyright 2019 American [employer] Association. All rights reserved. The codes documented in this report are preliminary and upon coder review may  be revised to meet current compliance requirements. Maylon Peppers, MD Maylon Peppers,  04/29/2021 9:58:58 AM This report has been signed electronically. Number of Addenda: 0

## 2021-04-29 NOTE — Anesthesia Procedure Notes (Signed)
Date/Time: 04/29/2021 9:05 AM Performed by: Orlie Dakin, CRNA Pre-anesthesia Checklist: Patient identified, Emergency Drugs available, Suction available and Patient being monitored Patient Re-evaluated:Patient Re-evaluated prior to induction Oxygen Delivery Method: Nasal cannula Induction Type: IV induction Placement Confirmation: positive ETCO2

## 2021-04-29 NOTE — Transfer of Care (Signed)
Immediate Anesthesia Transfer of Care Note  Patient: Francisco Allen  Procedure(s) Performed: COLONOSCOPY WITH PROPOFOL POLYPECTOMY  Patient Location: Endoscopy Unit  Anesthesia Type:General  Level of Consciousness: awake, alert  and oriented  Airway & Oxygen Therapy: Patient Spontanous Breathing  Post-op Assessment: Report given to RN and Post -op Vital signs reviewed and stable  Post vital signs: Reviewed and stable  Last Vitals:  Vitals Value Taken Time  BP    Temp 36.6 C 04/29/21 0942  Pulse    Resp 16 04/29/21 0942  SpO2 100 % 04/29/21 0942    Last Pain:  Vitals:   04/29/21 0942  TempSrc: Oral  PainSc: 0-No pain         Complications: No notable events documented.

## 2021-04-30 LAB — SURGICAL PATHOLOGY

## 2021-05-01 ENCOUNTER — Encounter (INDEPENDENT_AMBULATORY_CARE_PROVIDER_SITE_OTHER): Payer: Self-pay | Admitting: *Deleted

## 2021-05-02 ENCOUNTER — Other Ambulatory Visit: Payer: No Typology Code available for payment source

## 2021-05-02 ENCOUNTER — Other Ambulatory Visit: Payer: Self-pay

## 2021-05-02 DIAGNOSIS — N289 Disorder of kidney and ureter, unspecified: Secondary | ICD-10-CM

## 2021-05-03 LAB — BMP8+EGFR
BUN/Creatinine Ratio: 15 (ref 10–24)
BUN: 29 mg/dL — ABNORMAL HIGH (ref 8–27)
CO2: 21 mmol/L (ref 20–29)
Calcium: 9.6 mg/dL (ref 8.6–10.2)
Chloride: 105 mmol/L (ref 96–106)
Creatinine, Ser: 1.99 mg/dL — ABNORMAL HIGH (ref 0.76–1.27)
Glucose: 100 mg/dL — ABNORMAL HIGH (ref 65–99)
Potassium: 4.8 mmol/L (ref 3.5–5.2)
Sodium: 142 mmol/L (ref 134–144)
eGFR: 37 mL/min/{1.73_m2} — ABNORMAL LOW (ref >59)

## 2021-05-05 ENCOUNTER — Encounter (HOSPITAL_COMMUNITY): Payer: Self-pay | Admitting: Gastroenterology

## 2021-05-05 NOTE — Progress Notes (Signed)
NA - no VM 6/27-jhb

## 2021-05-06 ENCOUNTER — Other Ambulatory Visit: Payer: Self-pay

## 2021-05-06 DIAGNOSIS — N289 Disorder of kidney and ureter, unspecified: Secondary | ICD-10-CM

## 2021-05-06 LAB — TOXASSURE SELECT 13 (MW), URINE

## 2021-05-08 ENCOUNTER — Other Ambulatory Visit: Payer: Self-pay | Admitting: Family Medicine

## 2021-05-08 DIAGNOSIS — N289 Disorder of kidney and ureter, unspecified: Secondary | ICD-10-CM

## 2021-05-08 NOTE — Progress Notes (Unsigned)
Placed referral to nephrology

## 2021-05-13 ENCOUNTER — Encounter: Payer: Self-pay | Admitting: General Surgery

## 2021-05-13 ENCOUNTER — Other Ambulatory Visit: Payer: Self-pay

## 2021-05-13 ENCOUNTER — Ambulatory Visit (INDEPENDENT_AMBULATORY_CARE_PROVIDER_SITE_OTHER): Payer: No Typology Code available for payment source | Admitting: General Surgery

## 2021-05-13 VITALS — BP 164/97 | HR 82 | Temp 98.8°F | Resp 12 | Ht 69.0 in | Wt 213.0 lb

## 2021-05-13 DIAGNOSIS — Z933 Colostomy status: Secondary | ICD-10-CM

## 2021-05-13 DIAGNOSIS — K56699 Other intestinal obstruction unspecified as to partial versus complete obstruction: Secondary | ICD-10-CM

## 2021-05-13 MED ORDER — NEOMYCIN SULFATE 500 MG PO TABS: 1000.0000 mg | ORAL_TABLET | ORAL | 0 refills | Status: DC

## 2021-05-13 MED ORDER — OXYCODONE HCL 5 MG PO TABS: 5.0000 mg | ORAL_TABLET | ORAL | 0 refills | Status: DC | PRN

## 2021-05-13 MED ORDER — METRONIDAZOLE 500 MG PO TABS: 1000.0000 mg | ORAL_TABLET | ORAL | 0 refills | Status: DC

## 2021-05-13 NOTE — Progress Notes (Signed)
[facility] History and Physical  Chief Complaint   Post-op Follow-up     Francisco Allen is a 60 y.o. male.  HPI: Francisco Allen is well known to me after colostomy for sigmoid stricture. He has had his colonoscopy and had some polyps removed. He has healed up from his surgery and is ready to get reversed. He has been noted recently to have worsening CKD and he is being referred to Nephrology by Dr. Warrick Parisian.   He says he has been urinating frequently but otherwise has not noticed any issues.  His A1C was down and he has lost some weight. He is now off his metformin.  He is ready to get reversed and wants to plan for this as soon as possible. He has some intermittent abdominal pain around the ostomy.   Past Medical History:  Diagnosis Date   Arthritis    "all over my body I think" (09/23/2017)   Colon polyp    Diverticulitis    GERD (gastroesophageal reflux disease)    hx   Hyperlipidemia    Large bowel obstruction ([place]) 01/31/2021   Pre-diabetes     Past Surgical History:  Procedure Laterality Date   ARTHROSCOPY KNEE W/ DRILLING Left 10/25/2020   COLONOSCOPY  X ~ 2   "brother passed away w/colon cancer; I've probably had 4 colonoscopies done since he passed" (09/23/2017)   COLONOSCOPY W/ BIOPSIES AND POLYPECTOMY  X 2   COLONOSCOPY WITH PROPOFOL N/A 04/29/2021   Procedure: COLONOSCOPY WITH PROPOFOL;  Surgeon: Harvel Quale, MD;  Location: [facility];  Service: Gastroenterology;  Laterality: N/A;  8:45   COLOSTOMY N/A 01/31/2021   Procedure: COLOSTOMY;  Surgeon: [name], MD;  Location: [facility];  Service: General;  Laterality: N/A;   CYST EXCISION  2004   Roof of mouth   HEMORRHOID SURGERY     "lanced"   LEFT HEART CATH AND CORONARY ANGIOGRAPHY N/A 09/24/2017   Procedure: LEFT HEART CATH AND CORONARY ANGIOGRAPHY;  Surgeon: Jettie Booze, MD;  Location: [facility];  Service: Cardiovascular;  Laterality: N/A;   PARTIAL  COLECTOMY N/A 01/31/2021   Procedure: PARTIAL COLECTOMY;  Surgeon: [name], MD;  Location: [facility];  Service: General;  Laterality: N/A;   POLYPECTOMY  04/29/2021   Procedure: POLYPECTOMY;  Surgeon: Montez Morita, Quillian Quince, MD;  Location: [facility];  Service: Gastroenterology;;   SHOULDER ARTHROSCOPY WITH ROTATOR CUFF REPAIR Right     Family History  Problem Relation Age of Onset   Colon cancer Brother    Dementia Mother    CVA Father    Sudden death Sister 9       in her sleep, was told a heart attack   Heart disease Brother        CABG   Heart disease Brother        CABG   Heart disease Brother        CABG    Social History   Tobacco Use   Smoking status: Every Day    Packs/day: 1.00    Years: 34.00    Pack years: 34.00    Types: Cigarettes   Smokeless tobacco: Never  Vaping Use   Vaping Use: Never used  Substance Use Topics   Alcohol use: No   Drug use: No    Medications: I have reviewed the patient's current medications. Allergies as of 05/13/2021       Reactions   Bee Venom Anaphylaxis, Swelling,  Other (See Comments)   Anaphylactic shock   Penicillins Anaphylaxis, Swelling   Airway closes up Has patient had a PCN reaction causing immediate rash, facial/tongue/throat swelling, SOB or lightheadedness with hypotension: Yes Has patient had a PCN reaction causing severe rash involving mucus membranes or skin necrosis: Yes Has patient had a PCN reaction that required hospitalization: [name] Has patient had a PCN reaction occurring within the last 10 years: No If all of the above answers are "NO", then may proceed with Cephalosporin use.        Medication List        Accurate as of May 13, 2021  9:30 AM. If you have any questions, ask your nurse or doctor.          STOP taking these medications    ondansetron 4 MG disintegrating tablet Commonly known as: ZOFRAN-ODT Stopped by: [name], MD       TAKE these medications     ALPRAZolam 1 MG tablet Commonly known as: XANAX Take 1 tablet (1 mg total) by mouth 3 (three) times daily as needed for anxiety.   bisacodyl 5 MG EC tablet Commonly known as: DULCOLAX Take 5 mg by mouth daily as needed for moderate constipation.   cholecalciferol 25 MCG (1000 UNIT) tablet Commonly known as: VITAMIN D3 Take 1,000 Units by mouth daily.   Fish Oil 1000 MG Caps Take 1,000 mg by mouth daily.   lisinopril 10 MG tablet Commonly known as: ZESTRIL Take 1 tablet (10 mg total) by mouth daily.   MENS 50+ MULTI VITAMIN/MIN PO Take 1 tablet by mouth daily.   metFORMIN 500 MG tablet Commonly known as: GLUCOPHAGE Take 1 tablet (500 mg total) by mouth 2 (two) times daily with a meal. TAKE  (1)  TABLET TWICE A DAY WITH MEALS (BREAKFAST AND SUPPER)   metoprolol tartrate 25 MG tablet Commonly known as: LOPRESSOR Take 1 tablet (25 mg total) by mouth 2 (two) times daily. TAKE  (1)  TABLET TWICE A DAY.   oxyCODONE 5 MG immediate release tablet Commonly known as: Oxy IR/ROXICODONE Take 1 tablet (5 mg total) by mouth every 4 (four) hours as needed for severe pain or breakthrough pain.   rosuvastatin 40 MG tablet Commonly known as: CRESTOR Take 1 tablet (40 mg total) by mouth daily.         ROS:  A comprehensive review of systems was negative except for: Gastrointestinal: positive for abdominal pain and reflux symptoms Genitourinary: positive for frequency and worsening renal function  Blood pressure (!) 164/97, pulse 82, temperature 98.8 F (37.1 C), temperature source Other (Comment), resp. rate 12, height 5\' 9"  (1.753 m), weight 213 lb (96.6 kg), SpO2 97 %. Physical Exam Vitals reviewed.  Constitutional:      Appearance: Normal appearance.  HENT:     Head: Normocephalic.     Nose: Nose normal.     Mouth/Throat:     Mouth: Mucous membranes are moist.  Eyes:     Extraocular Movements: Extraocular movements intact.  Cardiovascular:     Rate and Rhythm: Normal  rate and regular rhythm.  Pulmonary:     Effort: Pulmonary effort is normal.     Breath sounds: Normal breath sounds.  Abdominal:     General: There is no distension.     Palpations: Abdomen is soft.     Tenderness: There is no abdominal tenderness.     Comments: Ostomy in the left lower quadrant, pink with stool, midline healed by secondary  intention  Musculoskeletal:        General: Normal range of motion.     Cervical back: Normal range of motion.  Skin:    General: Skin is warm.  Neurological:     General: No focal deficit present.     Mental Status: He is alert and oriented to person, place, and time.  Psychiatric:        Mood and Affect: Mood normal.        Behavior: Behavior normal.        Thought Content: Thought content normal.        Judgment: Judgment normal.    Results: Colonoscopy 04/2021 One 10 mm polyp in the ascending colon, removed with a cold snare. Resected and retrieved. - One 2 mm polyp in the transverse colon, removed with a cold biopsy forceps. Resected and retrieved. - Diverticulosis in the descending colon. - The colon between 20 cm proximal to the anus is normal. - One 4 mm polyp in the rectum, removed with a cold snare. Resected but not retrieved. - Non-bleeding internal hemorrhoids.   Assessment & Plan:  Francisco Allen is a 60 y.o. male with an end colostomy in place after sigmoid stricture for diverticulitis. He wants to be reversed and is 3 months out. He needs to see nephrology prior to see what is going on with the kidneys and get their approval.   Discussed reversal and risk of bleeding, infection, anastomotic leak, injury to other organs, ureter. Discussed post operative stay and course. Discussed getting nephrology to see him before we schedule him.  Rx sent to pharmacy. Roxicodone refilled for pain associated with ostomy.   Colon preparation:  Buy from the Store: Miralax bottle (288g).  Gatorade 64 oz (not red). Dulcolax tablets.    The Day Prior to Surgery: Take 4 ducolax tablets at 7am with water. Do an enema through your rectum at 8AM. Drink plenty of clear liquids all day to avoid dehydration, no solid food.    Mix the bottle of Miralax and 64 oz of Gatorade and drink this mixture starting at 10am.  Drink it gradually over the next few hours, 8 ounces every 15-30 minutes until it is gone. Finish this by 2pm.  Repeat an enema at 2pm.  Take 2 neomycin 500mg  tablets and 2 metronidazole 500mg  tablets at 2 pm. Take 2 neomycin 500mg  tablets and 2 metronidazole 500mg  tablets at 3pm. Take 2 neomycin 500mg  tablets and 2 metronidazole 500mg  tablets at 10pm.    Do not eat or drink anything after midnight the night before your surgery.  Do not eat or drink anything that morning, and take medications as instructed by the hospital staff on your preoperative visit.    All questions were answered to the satisfaction of the patient.   [name] 05/13/2021, 9:30 AM

## 2021-05-13 NOTE — Patient Instructions (Signed)
Colon Preparation:  Buy from the Store: Miralax bottle [phone]).  Gatorade 64 oz (not red). Dulcolax tablets.   The Day Prior to Surgery: Take 4 ducolax tablets at 7am with water. Do an enema through your rectum at 8AM. Drink plenty of clear liquids all day to avoid dehydration, no solid food.    Mix the bottle of Miralax and 64 oz of Gatorade and drink this mixture starting at 10am.  Drink it gradually over the next few hours, 8 ounces every 15-30 minutes until it is gone. Finish this by 2pm.  Repeat an enema at 2pm.  Take 2 neomycin 500mg  tablets and 2 metronidazole 500mg  tablets at 2 pm. Take 2 neomycin 500mg  tablets and 2 metronidazole 500mg  tablets at 3pm. Take 2 neomycin 500mg  tablets and 2 metronidazole 500mg  tablets at 10pm.    Do not eat or drink anything after midnight the night before your surgery.  Do not eat or drink anything that morning, and take medications as instructed by the hospital staff on your preoperative visit.    Colostomy Reversal Surgery A colostomy reversal is a surgical procedure that is done to reverse a colostomy. In this reversal procedure, the large intestine is disconnected from the opening in the abdomen (stoma). Then, the changes that were made to the intestine during the colostomy will be reversed to restore the flow of stool through the entire intestine. Depending on the type of colostomy being reversed, this may involve one of the following: Reconnecting the two ends of the intestine that were separated during colostomy surgery. Closing the opening that was made in the side of the intestine to allow stool to be redirected through the stoma. After this surgery, a stoma and colostomy bag are no longer needed. Stool (feces) can leave your body through the rectum, as it did before you had a colostomy. Tell a health care provider about: Any allergies you have. All medicines you are taking, including vitamins, herbs, eye drops, creams, and over-the-counter  medicines. Any problems you or family members have had with anesthetic medicines. Any blood disorders you have. Any surgeries you have had. Any medical conditions you have. Whether you are pregnant or may be pregnant. What are the risks? Generally, this is a safe procedure. However, problems may occur, including: Infection. Bleeding. Allergic reactions to medicines. Damage to other structures or organs. A temporary condition in which the intestines stop moving and working correctly (ileus). This usually goes away in 3-7 days. A collection of pus (abscess) in the abdomen or pelvis. Intestinal blockage. Leaking at the area of the intestine where it was reconnected (anastomotic leak) or where the opening of the stoma was closed. Narrowing of the intestine (stricture) at the place where it was reconnected. Urinary and sexual dysfunction. What happens before the procedure? Medicines Ask your health care provider about: Changing or stopping your regular medicines. This is especially important if you are taking diabetes medicines or blood thinners. Taking medicines such as aspirin and ibuprofen. These medicines can thin your blood. Do not take these medicines unless your health care provider tells you to take them. Taking over-the-counter medicines, vitamins, herbs, and supplements. General instructions You may have an exam or testing. Plan to have someone take you home after the procedure. Plan to have a responsible adult care for you for at least 24 hours after you leave the hospital or clinic. This is important. Do not use any products that contain nicotine or tobacco, such as cigarettes, e-cigarettes, and chewing tobacco. These  can delay incision healing after surgery. If you need help quitting, ask your health care provider. Ask your health care provider what steps will be taken to help prevent infection. These may include: Removing hair at the surgery site. Washing skin with a  germ-killing soap. Antibiotic medicine. What happens during the procedure?  An IV will be inserted into one of your veins. You may be given: A medicine to help you relax (sedative). A medicine to make you fall asleep (general anesthetic). An incision will be made in your abdomen at the site of the stoma. The large intestine will be disconnected from the abdomen at the site of the stoma. The next steps will vary depending on the type of colostomy reversal surgery you are having. There are two main types: End colostomy reversal. The surgeon will use stitches (sutures) or staples to reconnect the two ends of the intestine that were separated during the end colostomy. Loop colostomy reversal. The surgeon will use sutures or staples to close the opening in the intestine that had been allowing stool to be redirected through the stoma. The intestine will then be put back into its normal position inside the abdomen. The incision will be closed with sutures, skin glue, or adhesive strips. It may be covered with bandages (dressings). The procedure may vary among health care providers and hospitals. What happens after the procedure? Your blood pressure, heart rate, breathing rate, and blood oxygen level will be monitored until you leave the hospital or clinic. You will be given pain medicine as needed. You will slowly increase your diet and movement as told by your health care provider. Summary A colostomy reversal is a surgical procedure that is done to reverse a colostomy. After this surgery, stool (feces) can leave your body through the rectum, as it did before you had a colostomy. Before the procedure, follow instructions from your health care provider about taking medicines and about eating and drinking. During the procedure, your colostomy will be reversed, and the incision will be closed with sutures, skin glue, or adhesive strips. It may be covered with bandages (dressings). After the procedure,  you will slowly increase your diet and movement as told by your health care provider. This information is not intended to replace advice given to you by your health care provider. Make sure you discuss any questions you have with your healthcare provider. Document Revised: 04/13/2018 Document Reviewed: 04/13/2018 Elsevier Patient Education  [place].

## 2021-05-28 ENCOUNTER — Other Ambulatory Visit: Payer: Self-pay | Admitting: Nephrology

## 2021-05-28 ENCOUNTER — Other Ambulatory Visit (HOSPITAL_COMMUNITY): Payer: Self-pay | Admitting: Nephrology

## 2021-05-28 DIAGNOSIS — N170 Acute kidney failure with tubular necrosis: Secondary | ICD-10-CM

## 2021-05-28 DIAGNOSIS — I129 Hypertensive chronic kidney disease with stage 1 through stage 4 chronic kidney disease, or unspecified chronic kidney disease: Secondary | ICD-10-CM

## 2021-05-28 DIAGNOSIS — E1129 Type 2 diabetes mellitus with other diabetic kidney complication: Secondary | ICD-10-CM

## 2021-05-28 DIAGNOSIS — E1122 Type 2 diabetes mellitus with diabetic chronic kidney disease: Secondary | ICD-10-CM

## 2021-05-29 ENCOUNTER — Telehealth (INDEPENDENT_AMBULATORY_CARE_PROVIDER_SITE_OTHER): Payer: No Typology Code available for payment source | Admitting: Family Medicine

## 2021-05-29 DIAGNOSIS — Z933 Colostomy status: Secondary | ICD-10-CM

## 2021-05-29 NOTE — Telephone Encounter (Signed)
Pt called requesting a refill on his pain medication.

## 2021-05-30 MED ORDER — OXYCODONE HCL 5 MG PO TABS: 5.0000 mg | ORAL_TABLET | ORAL | 0 refills | Status: DC | PRN

## 2021-06-02 ENCOUNTER — Ambulatory Visit (HOSPITAL_COMMUNITY)
Admission: RE | Admit: 2021-06-02 | Discharge: 2021-06-02 | Disposition: A | Discharge status: Home / Self Care | Payer: No Typology Code available for payment source | Source: Ambulatory Visit | Attending: Nephrology | Admitting: Nephrology

## 2021-06-02 ENCOUNTER — Other Ambulatory Visit: Payer: Self-pay

## 2021-06-02 DIAGNOSIS — E1122 Type 2 diabetes mellitus with diabetic chronic kidney disease: Secondary | ICD-10-CM | POA: Insufficient documentation

## 2021-06-02 DIAGNOSIS — R809 Proteinuria, unspecified: Secondary | ICD-10-CM | POA: Insufficient documentation

## 2021-06-02 DIAGNOSIS — N170 Acute kidney failure with tubular necrosis: Secondary | ICD-10-CM | POA: Diagnosis not present

## 2021-06-02 DIAGNOSIS — E1129 Type 2 diabetes mellitus with other diabetic kidney complication: Secondary | ICD-10-CM | POA: Insufficient documentation

## 2021-06-02 DIAGNOSIS — I129 Hypertensive chronic kidney disease with stage 1 through stage 4 chronic kidney disease, or unspecified chronic kidney disease: Secondary | ICD-10-CM | POA: Insufficient documentation

## 2021-06-16 ENCOUNTER — Other Ambulatory Visit (INDEPENDENT_AMBULATORY_CARE_PROVIDER_SITE_OTHER): Payer: No Typology Code available for payment source | Admitting: General Surgery

## 2021-06-16 DIAGNOSIS — Z09 Encounter for follow-up examination after completed treatment for conditions other than malignant neoplasm: Secondary | ICD-10-CM

## 2021-06-16 MED ORDER — OXYCODONE HCL 5 MG PO TABS: 5.0000 mg | ORAL_TABLET | ORAL | 0 refills | Status: DC | PRN

## 2021-06-16 NOTE — Progress Notes (Signed)
Reordered oxycodone

## 2021-06-25 ENCOUNTER — Other Ambulatory Visit (HOSPITAL_COMMUNITY): Payer: Self-pay

## 2021-06-25 ENCOUNTER — Other Ambulatory Visit: Payer: Self-pay

## 2021-06-25 DIAGNOSIS — E785 Hyperlipidemia, unspecified: Secondary | ICD-10-CM

## 2021-06-25 DIAGNOSIS — E782 Mixed hyperlipidemia: Secondary | ICD-10-CM

## 2021-06-25 MED ORDER — ROSUVASTATIN CALCIUM 40 MG PO TABS
40.0000 mg | ORAL_TABLET | Freq: Every day | ORAL | 1 refills | Status: DC
  Filled 2021-06-25: qty 90, 90d supply, fill #0
  Filled 2021-11-19: qty 90, 90d supply, fill #1

## 2021-06-26 ENCOUNTER — Other Ambulatory Visit (HOSPITAL_COMMUNITY): Payer: Self-pay

## 2021-06-26 ENCOUNTER — Ambulatory Visit: Payer: No Typology Code available for payment source | Admitting: Urology

## 2021-06-30 ENCOUNTER — Encounter: Payer: Self-pay | Admitting: Family Medicine

## 2021-07-17 ENCOUNTER — Encounter: Payer: No Typology Code available for payment source | Admitting: General Surgery

## 2021-07-31 ENCOUNTER — Encounter: Payer: Self-pay | Admitting: Family Medicine

## 2021-07-31 ENCOUNTER — Ambulatory Visit (INDEPENDENT_AMBULATORY_CARE_PROVIDER_SITE_OTHER): Payer: No Typology Code available for payment source | Admitting: Family Medicine

## 2021-07-31 ENCOUNTER — Other Ambulatory Visit: Payer: Self-pay

## 2021-07-31 VITALS — BP 128/84 | HR 87 | Ht 69.0 in | Wt 217.5 lb

## 2021-07-31 DIAGNOSIS — I10 Essential (primary) hypertension: Secondary | ICD-10-CM

## 2021-07-31 DIAGNOSIS — E1129 Type 2 diabetes mellitus with other diabetic kidney complication: Secondary | ICD-10-CM | POA: Diagnosis not present

## 2021-07-31 DIAGNOSIS — F411 Generalized anxiety disorder: Secondary | ICD-10-CM | POA: Diagnosis not present

## 2021-07-31 DIAGNOSIS — R809 Proteinuria, unspecified: Secondary | ICD-10-CM

## 2021-07-31 DIAGNOSIS — E782 Mixed hyperlipidemia: Secondary | ICD-10-CM | POA: Diagnosis not present

## 2021-07-31 LAB — BAYER DCA HB A1C WAIVED: HB A1C (BAYER DCA - WAIVED): 6.5 % — ABNORMAL HIGH (ref 4.8–5.6)

## 2021-07-31 MED ORDER — ALPRAZOLAM 1 MG PO TABS: 1.0000 mg | ORAL_TABLET | Freq: Three times a day (TID) | ORAL | 2 refills | Status: DC | PRN

## 2021-07-31 NOTE — Progress Notes (Signed)
BP 128/84   Pulse 87   Ht _0  (1.753 m)   Wt 217 lb 8 oz (98.7 kg)   SpO2 96%   BMI 32.12 kg/m    Subjective:   Patient ID: Francisco Allen, male    DOB: 04-08-1961, 60 y.o.   MRN: [id number]  HPI: Francisco Allen is a 60 y.o. male presenting on 07/31/2021 for Medical Management of Chronic Issues, Diabetes, and Hyperlipidemia   HPI Type 2 diabetes mellitus Patient comes in today for recheck of his diabetes. Patient has been currently taking no medication currently, has been diet controlled, A1c 6-1/2. Patient is not currently on an ACE inhibitor/ARB. Patient has not seen an ophthalmologist this year. Patient denies any issues with their feet. The symptom started onset as an adult hypertension and hyperlipidemia ARE RELATED TO DM   Hypertension Patient is currently on carvedilol, and their blood pressure today is 128/84. Patient denies any lightheadedness or dizziness. Patient denies headaches, blurred vision, chest pains, shortness of breath, or weakness. Denies any side effects from medication and is content with current medication.   Hyperlipidemia Patient is coming in for recheck of his hyperlipidemia. The patient is currently taking Crestor and fish oils. They deny any issues with myalgias or history of liver damage from it. They deny any focal numbness or weakness or chest pain.   Anxiety recheck Current rx-Xanax 1 mg 3 times daily as needed # meds rx-90 Effectiveness of current meds-Xanax 1 mg 3 times daily as needed Adverse reactions form meds-none  Pill count performed-No Last drug screen -05/01/2021 ( high risk q73m moderate risk q659mlow risk yearly ) Urine drug [place] today- No [place]  If yes were their any concerning findings? -Patient is on pain medicines from surgeries and further arthritis, we warned about mixing these and tapering off 1 or the other  No flowsheet data found.   Controlled substance contract signed on:  05/01/2021  Relevant past medical, surgical, family and social history reviewed and updated as indicated. Interim medical history since our last visit reviewed. Allergies and medications reviewed and updated.  Review of Systems  Constitutional:  Negative for chills and fever.  Eyes:  Negative for discharge.  Respiratory:  Negative for shortness of breath and wheezing.   Cardiovascular:  Negative for chest pain and leg swelling.  Gastrointestinal:  Negative for constipation and diarrhea.       Colostomy bag in place  Musculoskeletal:  Negative for back pain and gait problem.  Skin:  Negative for rash.  All other systems reviewed and are negative.  Per HPI unless specifically indicated above   Allergies as of 07/31/2021       Reactions   Bee Venom Anaphylaxis, Swelling, Other (See Comments)   Anaphylactic shock   Penicillins Anaphylaxis, Swelling   Airway closes up Has patient had a PCN reaction causing immediate rash, facial/tongue/throat swelling, SOB or lightheadedness with hypotension: Yes Has patient had a PCN reaction causing severe rash involving mucus membranes or skin necrosis: Yes Has patient had a PCN reaction that required hospitalization: YeAlfred Levinsas patient had a PCN reaction occurring within the last 10 years: No If all of the above answers are "NO", then may proceed with Cephalosporin use.        Medication List        Accurate as of July 31, 2021 11:52 AM. If you have any questions, ask your nurse or doctor.  STOP taking these medications    lisinopril 10 MG tablet Commonly known as: ZESTRIL Stopped by: [name], MD   metFORMIN 500 MG tablet Commonly known as: GLUCOPHAGE Stopped by: Fransisca Kaufmann Aurilla Coulibaly, MD   metoprolol tartrate 25 MG tablet Commonly known as: LOPRESSOR Stopped by: [name], MD   metroNIDAZOLE 500 MG tablet Commonly known as: Flagyl Stopped by: Fransisca Kaufmann Raeford Brandenburg, MD   neomycin 500 MG  tablet Commonly known as: MYCIFRADIN Stopped by: Fransisca Kaufmann Thalia Turkington, MD   oxyCODONE 5 MG immediate release tablet Commonly known as: Oxy IR/ROXICODONE Stopped by: Fransisca Kaufmann Seville Brick, MD       TAKE these medications    ALPRAZolam 1 MG tablet Commonly known as: XANAX Take 1 tablet (1 mg total) by mouth 3 (three) times daily as needed for anxiety.   bisacodyl 5 MG EC tablet Commonly known as: DULCOLAX Take 5 mg by mouth daily as needed for moderate constipation.   carvedilol 6.25 MG tablet Commonly known as: COREG Take 6.25 mg by mouth 2 (two) times daily with a meal.   cholecalciferol 25 MCG (1000 UNIT) tablet Commonly known as: VITAMIN D3 Take 1,000 Units by mouth daily.   Fish Oil 1000 MG Caps Take 1,000 mg by mouth daily.   MENS 50+ MULTI VITAMIN/MIN PO Take 1 tablet by mouth daily.   rosuvastatin 40 MG tablet Commonly known as: CRESTOR Take 1 tablet (40 mg total) by mouth daily.   tamsulosin 0.4 MG Caps capsule Commonly known as: FLOMAX Take 0.4 mg by mouth at bedtime.         Objective:   BP 128/84   Pulse 87   Ht _0  (1.753 m)   Wt 217 lb 8 oz (98.7 kg)   SpO2 96%   BMI 32.12 kg/m   Wt Readings from Last 3 Encounters:  07/31/21 217 lb 8 oz (98.7 kg)  05/13/21 213 lb (96.6 kg)  04/28/21 208 lb (94.3 kg)    Physical Exam Vitals and nursing note reviewed.  Constitutional:      General: He is not in acute distress.    Appearance: He is well-developed. He is not diaphoretic.  Eyes:     General: No scleral icterus.    Conjunctiva/sclera: Conjunctivae normal.  Neck:     Thyroid: No thyromegaly.  Cardiovascular:     Rate and Rhythm: Normal rate and regular rhythm.     Heart sounds: Normal heart sounds. No murmur heard. Pulmonary:     Effort: Pulmonary effort is normal. No respiratory distress.     Breath sounds: Normal breath sounds. No wheezing.  Musculoskeletal:        General: No swelling. Normal range of motion.     Cervical back: Neck  supple.  Lymphadenopathy:     Cervical: No cervical adenopathy.  Skin:    General: Skin is warm and dry.     Findings: No rash.  Neurological:     Mental Status: He is alert and oriented to person, place, and time.     Coordination: Coordination normal.  Psychiatric:        Behavior: Behavior normal.      Assessment & Plan:   Problem List Items Addressed This Visit       Cardiovascular and Mediastinum   HTN (hypertension)   Relevant Medications   carvedilol (COREG) 6.25 MG tablet   Other Relevant Orders   CBC with Differential/Platelet   CMP14+EGFR   Lipid panel   Bayer DCA Hb  A1c Waived     Endocrine   Controlled type 2 diabetes mellitus with microalbuminuria (HCC)   Relevant Orders   CBC with Differential/Platelet   CMP14+EGFR   Lipid panel   Bayer DCA Hb A1c Waived     Other   Hyperlipidemia - Primary   Relevant Medications   carvedilol (COREG) 6.25 MG tablet   Other Relevant Orders   CBC with Differential/Platelet   CMP14+EGFR   Lipid panel   Bayer DCA Hb A1c Waived   GAD (generalized anxiety disorder)   Relevant Medications   ALPRAZolam (XANAX) 1 MG tablet    A1c looks good at 6.5, continue with diet control, continue follow-up with urology and nephrology and surgery Follow up plan: No follow-ups on file.  Counseling provided for all of the vaccine components Orders Placed This Encounter  Procedures   CBC with Differential/Platelet   CMP14+EGFR   Lipid panel   Bayer DCA Hb A1c [place], MD [facility] 07/31/2021, 11:52 AM

## 2021-08-01 LAB — CBC WITH DIFFERENTIAL/PLATELET
Basophils Absolute: 0.1 10*3/uL (ref 0.0–0.2)
Basos: 1 %
EOS (ABSOLUTE): 0.2 10*3/uL (ref 0.0–0.4)
Eos: 2 %
Hematocrit: 40.4 % (ref 37.5–51.0)
Hemoglobin: 13.1 g/dL (ref 13.0–17.7)
Immature Grans (Abs): 0.1 10*3/uL (ref 0.0–0.1)
Immature Granulocytes: 1 %
Lymphocytes Absolute: 2.2 10*3/uL (ref 0.7–3.1)
Lymphs: 17 %
MCH: 27 pg (ref 26.6–33.0)
MCHC: 32.4 g/dL (ref 31.5–35.7)
MCV: 83 fL (ref 79–97)
Monocytes Absolute: 1 10*3/uL — ABNORMAL HIGH (ref 0.1–0.9)
Monocytes: 8 %
Neutrophils Absolute: 9.1 10*3/uL — ABNORMAL HIGH (ref 1.4–7.0)
Neutrophils: 71 %
Platelets: 381 10*3/uL (ref 150–450)
RBC: 4.86 x10E6/uL (ref 4.14–5.80)
RDW: 13.9 % (ref 11.6–15.4)
WBC: 12.7 10*3/uL — ABNORMAL HIGH (ref 3.4–10.8)

## 2021-08-01 LAB — CMP14+EGFR
ALT: 14 IU/L (ref 0–44)
AST: 10 IU/L (ref 0–40)
Albumin/Globulin Ratio: 1.3 (ref 1.2–2.2)
Albumin: 4 g/dL (ref 3.8–4.8)
Alkaline Phosphatase: 142 IU/L — ABNORMAL HIGH (ref 44–121)
BUN/Creatinine Ratio: 23 (ref 10–24)
BUN: 34 mg/dL — ABNORMAL HIGH (ref 8–27)
Bilirubin Total: 0.4 mg/dL (ref 0.0–1.2)
CO2: 24 mmol/L (ref 20–29)
Calcium: 10 mg/dL (ref 8.6–10.2)
Chloride: 98 mmol/L (ref 96–106)
Creatinine, Ser: 1.46 mg/dL — ABNORMAL HIGH (ref 0.76–1.27)
Globulin, Total: 3.2 g/dL (ref 1.5–4.5)
Glucose: 246 mg/dL — ABNORMAL HIGH (ref 65–99)
Potassium: 5.2 mmol/L (ref 3.5–5.2)
Sodium: 136 mmol/L (ref 134–144)
Total Protein: 7.2 g/dL (ref 6.0–8.5)
eGFR: 54 mL/min/{1.73_m2} — ABNORMAL LOW (ref >59)

## 2021-08-01 LAB — LIPID PANEL
Chol/HDL Ratio: 3.8 ratio (ref 0.0–5.0)
Cholesterol, Total: 103 mg/dL (ref 100–199)
HDL: 27 mg/dL — ABNORMAL LOW (ref >39)
LDL Chol Calc (NIH): 47 mg/dL (ref 0–99)
Triglycerides: 176 mg/dL — ABNORMAL HIGH (ref 0–149)
VLDL Cholesterol Cal: 29 mg/dL (ref 5–40)

## 2021-08-19 ENCOUNTER — Other Ambulatory Visit: Payer: No Typology Code available for payment source

## 2021-08-19 ENCOUNTER — Other Ambulatory Visit: Payer: Self-pay

## 2021-08-26 ENCOUNTER — Other Ambulatory Visit (HOSPITAL_COMMUNITY): Payer: Self-pay

## 2021-08-26 MED ORDER — LISINOPRIL 2.5 MG PO TABS: 2.5000 mg | ORAL_TABLET | Freq: Every day | ORAL | 3 refills | Status: DC

## 2021-08-26 MED ORDER — CARVEDILOL 6.25 MG PO TABS
6.2500 mg | ORAL_TABLET | Freq: Two times a day (BID) | ORAL | 3 refills | Status: DC
  Filled 2022-05-12 – 2022-05-28 (×2): qty 180, 90d supply, fill #0
  Filled 2022-08-19: qty 180, 90d supply, fill #1

## 2021-09-09 ENCOUNTER — Other Ambulatory Visit: Payer: No Typology Code available for payment source

## 2021-09-09 ENCOUNTER — Other Ambulatory Visit: Payer: Self-pay

## 2021-09-25 ENCOUNTER — Ambulatory Visit (INDEPENDENT_AMBULATORY_CARE_PROVIDER_SITE_OTHER): Payer: No Typology Code available for payment source | Admitting: General Surgery

## 2021-09-25 ENCOUNTER — Encounter: Payer: Self-pay | Admitting: General Surgery

## 2021-09-25 ENCOUNTER — Other Ambulatory Visit: Payer: Self-pay

## 2021-09-25 VITALS — BP 140/87 | HR 80 | Temp 98.7°F | Resp 16 | Ht 69.0 in | Wt 235.0 lb

## 2021-09-25 DIAGNOSIS — Z933 Colostomy status: Secondary | ICD-10-CM | POA: Diagnosis not present

## 2021-09-25 MED ORDER — METRONIDAZOLE 500 MG PO TABS: 1000.0000 mg | ORAL_TABLET | ORAL | 0 refills | Status: DC

## 2021-09-25 MED ORDER — NEOMYCIN SULFATE 500 MG PO TABS: 1000.0000 mg | ORAL_TABLET | ORAL | 0 refills | Status: DC

## 2021-09-25 NOTE — Patient Instructions (Signed)
Colon Preparation:  Buy from the Store: Miralax bottle [phone]).  Gatorade 64 oz (not red). Dulcolax tablets.   The Day Prior to Surgery: Take 4 ducolax tablets at 7am with water. Do an enema through your rectum at 8AM. Drink plenty of clear liquids all day to avoid dehydration, no solid food.    Mix the bottle of Miralax and 64 oz of Gatorade and drink this mixture starting at 10am.  Drink it gradually over the next few hours, 8 ounces every 15-30 minutes until it is gone. Finish this by 2pm.  Repeat an enema at 2pm.  Take 2 neomycin 500mg  tablets and 2 metronidazole 500mg  tablets at 2 pm. Take 2 neomycin 500mg  tablets and 2 metronidazole 500mg  tablets at 3pm. Take 2 neomycin 500mg  tablets and 2 metronidazole 500mg  tablets at 10pm.    Do not eat or drink anything after midnight the night before your surgery.  Do not eat or drink anything that morning, and take medications as instructed by the hospital staff on your preoperative visit.    Colostomy Reversal Surgery A colostomy reversal is a surgical procedure that is done to reverse a colostomy. In this reversal procedure, the large intestine is disconnected from the opening in the abdomen (stoma). Then, the changes that were made to the intestine during the colostomy will be reversed to restore the flow of stool through the entire intestine. Depending on the type of colostomy being reversed, this may involve one of the following: Reconnecting the two ends of the intestine that were separated during colostomy surgery. Closing the opening that was made in the side of the intestine to allow stool to be redirected through the stoma. After this surgery, a stoma and colostomy bag are no longer needed. Stool (feces) can leave your body through the rectum, as it did before you had a colostomy. Tell a health care provider about: Any allergies you have. All medicines you are taking, including vitamins, herbs, eye drops, creams, and over-the-counter  medicines. Any problems you or family members have had with anesthetic medicines. Any blood disorders you have. Any surgeries you have had. Any medical conditions you have. Whether you are pregnant or may be pregnant. What are the risks? Generally, this is a safe procedure. However, problems may occur, including: Infection. Bleeding. Allergic reactions to medicines. Damage to other structures or organs. A temporary condition in which the intestines stop moving and working correctly (ileus). This usually goes away in 3-7 days. A collection of pus (abscess) in the abdomen or pelvis. Intestinal blockage. Leaking at the area of the intestine where it was reconnected (anastomotic leak) or where the opening of the stoma was closed. Narrowing of the intestine (stricture) at the place where it was reconnected. Urinary and sexual dysfunction. What happens before the procedure? Medicines Ask your health care provider about: Changing or stopping your regular medicines. This is especially important if you are taking diabetes medicines or blood thinners. Taking medicines such as aspirin and ibuprofen. These medicines can thin your blood. Do not take these medicines unless your health care provider tells you to take them. Taking over-the-counter medicines, vitamins, herbs, and supplements. General instructions You may have an exam or testing. Plan to have someone take you home after the procedure. Plan to have a responsible adult care for you for at least 24 hours after you leave the hospital or clinic. This is important. Do not use any products that contain nicotine or tobacco, such as cigarettes, e-cigarettes, and chewing tobacco. These  can delay incision healing after surgery. If you need help quitting, ask your health care provider. Ask your health care provider what steps will be taken to help prevent infection. These may include: Removing hair at the surgery site. Washing skin with a  germ-killing soap. Antibiotic medicine. What happens during the procedure?  An IV will be inserted into one of your veins. You may be given: A medicine to help you relax (sedative). A medicine to make you fall asleep (general anesthetic). An incision will be made in your abdomen at the site of the stoma. The large intestine will be disconnected from the abdomen at the site of the stoma. The next steps will vary depending on the type of colostomy reversal surgery you are having. There are two main types: End colostomy reversal. The surgeon will use stitches (sutures) or staples to reconnect the two ends of the intestine that were separated during the end colostomy. Loop colostomy reversal. The surgeon will use sutures or staples to close the opening in the intestine that had been allowing stool to be redirected through the stoma. The intestine will then be put back into its normal position inside the abdomen. The incision will be closed with sutures, skin glue, or adhesive strips. It may be covered with bandages (dressings). The procedure may vary among health care providers and hospitals. What happens after the procedure? Your blood pressure, heart rate, breathing rate, and blood oxygen level will be monitored until you leave the hospital or clinic. You will be given pain medicine as needed. You will slowly increase your diet and movement as told by your health care provider. Summary A colostomy reversal is a surgical procedure that is done to reverse a colostomy. After this surgery, stool (feces) can leave your body through the rectum, as it did before you had a colostomy. Before the procedure, follow instructions from your health care provider about taking medicines and about eating and drinking. During the procedure, your colostomy will be reversed, and the incision will be closed with sutures, skin glue, or adhesive strips. It may be covered with bandages (dressings). After the procedure,  you will slowly increase your diet and movement as told by your health care provider. This information is not intended to replace advice given to you by your health care provider. Make sure you discuss any questions you have with your health care provider. Document Revised: 04/13/2018 Document Reviewed: 04/13/2018 Elsevier Patient Education  [place].

## 2021-09-25 NOTE — Progress Notes (Signed)
[facility] History and Physical   Chief Complaint   Follow-up     Francisco Allen is a 60 y.o. male.  HPI: Francisco Allen is a 60 yo known to me with a colostomy in place after colectomy and end colostomy for stricture from diverticulitis. He has had a colonoscopy that was unremarkable. He has been seen by nephrology and urology after having worsening renal function and both feel like he is ok to proceed with surgery, avoiding renal toxic medication. He says he is doing well and his colostomy is functioning. He is getting stronger but is still limited by his knee pain. He is eating a regular diet. He does have a bulge in the abdomen around the ostomy.   Past Medical History:  Diagnosis Date   Arthritis    "all over my body I think" (09/23/2017)   Colon polyp    Diverticulitis    GERD (gastroesophageal reflux disease)    hx   Hyperlipidemia    Large bowel obstruction ([place]) 01/31/2021   Pre-diabetes     Past Surgical History:  Procedure Laterality Date   ARTHROSCOPY KNEE W/ DRILLING Left 10/25/2020   COLONOSCOPY  X ~ 2   "brother passed away w/colon cancer; I've probably had 4 colonoscopies done since he passed" (09/23/2017)   COLONOSCOPY W/ BIOPSIES AND POLYPECTOMY  X 2   COLONOSCOPY WITH PROPOFOL N/A 04/29/2021   Procedure: COLONOSCOPY WITH PROPOFOL;  Surgeon: Harvel Quale, MD;  Location: [facility];  Service: Gastroenterology;  Laterality: N/A;  8:45   COLOSTOMY N/A 01/31/2021   Procedure: COLOSTOMY;  Surgeon: [name], MD;  Location: [facility];  Service: General;  Laterality: N/A;   CYST EXCISION  2004   Roof of mouth   HEMORRHOID SURGERY     "lanced"   LEFT HEART CATH AND CORONARY ANGIOGRAPHY N/A 09/24/2017   Procedure: LEFT HEART CATH AND CORONARY ANGIOGRAPHY;  Surgeon: Jettie Booze, MD;  Location: [facility];  Service: Cardiovascular;  Laterality: N/A;   PARTIAL COLECTOMY N/A 01/31/2021   Procedure: PARTIAL COLECTOMY;   Surgeon: [name], MD;  Location: [facility];  Service: General;  Laterality: N/A;   POLYPECTOMY  04/29/2021   Procedure: POLYPECTOMY;  Surgeon: Montez Morita, Quillian Quince, MD;  Location: [facility];  Service: Gastroenterology;;   SHOULDER ARTHROSCOPY WITH ROTATOR CUFF REPAIR Right     Family History  Problem Relation Age of Onset   Colon cancer Brother    Dementia Mother    CVA Father    Sudden death Sister 28       in her sleep, was told a heart attack   Heart disease Brother        CABG   Heart disease Brother        CABG   Heart disease Brother        CABG    Social History   Tobacco Use   Smoking status: Every Day    Packs/day: 1.00    Years: 34.00    Pack years: 34.00    Types: Cigarettes   Smokeless tobacco: Never  Vaping Use   Vaping Use: Never used  Substance Use Topics   Alcohol use: No   Drug use: No    Medications: I have reviewed the patient's current medications. Allergies as of 09/25/2021       Reactions   Bee Venom Anaphylaxis, Swelling, Other (See Comments)   Anaphylactic shock   Penicillins Anaphylaxis, Swelling  Airway closes up Has patient had a PCN reaction causing immediate rash, facial/tongue/throat swelling, SOB or lightheadedness with hypotension: Yes Has patient had a PCN reaction causing severe rash involving mucus membranes or skin necrosis: Yes Has patient had a PCN reaction that required hospitalization: [name] Has patient had a PCN reaction occurring within the last 10 years: No If all of the above answers are "NO", then may proceed with Cephalosporin use.        Medication List        Accurate as of September 25, 2021  1:21 PM. If you have any questions, ask your nurse or doctor.          STOP taking these medications    cholecalciferol 25 MCG (1000 UNIT) tablet Commonly known as: VITAMIN D3 Stopped by: [name], MD       TAKE these medications    ALPRAZolam 1 MG tablet Commonly known as:  XANAX Take 1 tablet (1 mg total) by mouth 3 (three) times daily as needed for anxiety.   bisacodyl 5 MG EC tablet Commonly known as: DULCOLAX Take 5 mg by mouth daily as needed for moderate constipation.   carvedilol 6.25 MG tablet Commonly known as: COREG Take 1 tablet (6.25 mg total) by mouth 2 (two) times daily with a meal in the morning and evening   Fish Oil 1000 MG Caps Take 1,000 mg by mouth daily.   lisinopril 2.5 MG tablet Commonly known as: ZESTRIL Take 1 tablet (2.5 mg total) by mouth daily.   MENS 50+ MULTI VITAMIN/MIN PO Take 1 tablet by mouth daily.   rosuvastatin 40 MG tablet Commonly known as: CRESTOR Take 1 tablet (40 mg total) by mouth daily.   tamsulosin 0.4 MG Caps capsule Commonly known as: FLOMAX Take 0.4 mg by mouth at bedtime.         ROS:  A comprehensive review of systems was negative except for: Gastrointestinal: positive for colostomy in place, bulge around ostomy  Blood pressure 140/87, pulse 80, temperature 98.7 F (37.1 C), temperature source Other (Comment), resp. rate 16, height 5\' 9"  (1.753 m), weight 235 lb (106.6 kg), SpO2 95 %. Physical Exam Vitals reviewed.  Constitutional:      Appearance: Normal appearance.  HENT:     Head: Normocephalic.     Nose: Nose normal.  Eyes:     Extraocular Movements: Extraocular movements intact.  Cardiovascular:     Rate and Rhythm: Normal rate and regular rhythm.  Pulmonary:     Effort: Pulmonary effort is normal.     Breath sounds: Normal breath sounds.  Abdominal:     General: There is distension.     Palpations: Abdomen is soft.     Tenderness: There is no abdominal tenderness.     Comments: Colostomy in place, parastomal hernia noted  Musculoskeletal:        General: No swelling. Normal range of motion.     Cervical back: Normal range of motion.  Skin:    General: Skin is warm.  Neurological:     General: No focal deficit present.     Mental Status: He is oriented to person,  place, and time.  Psychiatric:        Mood and Affect: Mood normal.        Behavior: Behavior normal.    Results: None    Assessment & Plan:  Francisco Allen is a 60 y.o. male with colostomy in place that wants it reversed. Discussed reversal and  risk of bleeding, infection, injury to other organs, ureter, and risk of anastomotic leak. Discussed bowel preparation. Discussed parastomal hernia and that it will be repaired primarily but could return. Discussed limitations of no lifting > 10 lbs or excessive bending, squatting pushing or pulling for 8 weeks after surgery. Discussed blood transfusions and risk. Discussed post operative course and days in the hospital.   Colon Preparation:  Buy from the Store: Miralax bottle (288g).  Gatorade 64 oz (not red). Dulcolax tablets.   The Day Prior to Surgery: Take 4 ducolax tablets at 7am with water. Do an enema through your rectum at 8AM. Drink plenty of clear liquids all day to avoid dehydration, no solid food.    Mix the bottle of Miralax and 64 oz of Gatorade and drink this mixture starting at 10am.  Drink it gradually over the next few hours, 8 ounces every 15-30 minutes until it is gone. Finish this by 2pm.  Repeat an enema at 2pm.  Take 2 neomycin 500mg  tablets and 2 metronidazole 500mg  tablets at 2 pm. Take 2 neomycin 500mg  tablets and 2 metronidazole 500mg  tablets at 3pm. Take 2 neomycin 500mg  tablets and 2 metronidazole 500mg  tablets at 10pm.    Do not eat or drink anything after midnight the night before your surgery.  Do not eat or drink anything that morning, and take medications as instructed by the hospital staff on your preoperative visit.    All questions were answered to the satisfaction of the patient.    [name] 09/25/2021, 1:21 PM

## 2021-09-29 NOTE — H&P (Signed)
[facility] History and Physical   Chief Complaint   Follow-up     Francisco Allen is a 61 y.o. male.  HPI: Francisco Allen is a 60 yo known to me with a colostomy in place after colectomy and end colostomy for stricture from diverticulitis. He has had a colonoscopy that was unremarkable. He has been seen by nephrology and urology after having worsening renal function and both feel like he is ok to proceed with surgery, avoiding renal toxic medication. He says he is doing well and his colostomy is functioning. He is getting stronger but is still limited by his knee pain. He is eating a regular diet. He does have a bulge in the abdomen around the ostomy.   Past Medical History:  Diagnosis Date   Arthritis    "all over my body I think" (09/23/2017)   Colon polyp    Diverticulitis    GERD (gastroesophageal reflux disease)    hx   Hyperlipidemia    Large bowel obstruction ([place]) 01/31/2021   Pre-diabetes     Past Surgical History:  Procedure Laterality Date   ARTHROSCOPY KNEE W/ DRILLING Left 10/25/2020   COLONOSCOPY  X ~ 2   "brother passed away w/colon cancer; I've probably had 4 colonoscopies done since he passed" (09/23/2017)   COLONOSCOPY W/ BIOPSIES AND POLYPECTOMY  X 2   COLONOSCOPY WITH PROPOFOL N/A 04/29/2021   Procedure: COLONOSCOPY WITH PROPOFOL;  Surgeon: Harvel Quale, MD;  Location: [facility];  Service: Gastroenterology;  Laterality: N/A;  8:45   COLOSTOMY N/A 01/31/2021   Procedure: COLOSTOMY;  Surgeon: [name], MD;  Location: [facility];  Service: General;  Laterality: N/A;   CYST EXCISION  2004   Ro[place] of mouth   HEMORRHOID SURGERY     "lanced"   LEFT HEART CATH AND CORONARY ANGIOGRAPHY N/A 09/24/2017   Procedure: LEFT HEART CATH AND CORONARY ANGIOGRAPHY;  Surgeon: Jettie Booze, MD;  Location: [facility];  Service: Cardiovascular;  Laterality: N/A;   PARTIAL COLECTOMY N/A 01/31/2021   Procedure: PARTIAL COLECTOMY;   Surgeon: [name], MD;  Location: [facility];  Service: General;  Laterality: N/A;   POLYPECTOMY  04/29/2021   Procedure: POLYPECTOMY;  Surgeon: Montez Morita, Quillian Quince, MD;  Location: [facility];  Service: Gastroenterology;;   SHOULDER ARTHROSCOPY WITH ROTATOR CUFF REPAIR Right     Family History  Problem Relation Age of Onset   Colon cancer Brother    Dementia Mother    CVA Father    Sudden death Sister 81       in her sleep, was told a heart attack   Heart disease Brother        CABG   Heart disease Brother        CABG   Heart disease Brother        CABG    Social History   Tobacco Use   Smoking status: Every Day    Packs/day: 1.00    Years: 34.00    Pack years: 34.00    Types: Cigarettes   Smokeless tobacco: Never  Vaping Use   Vaping Use: Never used  Substance Use Topics   Alcohol use: No   Drug use: No    Medications: I have reviewed the patient's current medications. Allergies as of 09/25/2021       Reactions   Bee Venom Anaphylaxis, Swelling, Other (See Comments)   Anaphylactic shock   Penicillins Anaphylaxis, Swelling  Airway closes up Has patient had a PCN reaction causing immediate rash, facial/tongue/throat swelling, SOB or lightheadedness with hypotension: Yes Has patient had a PCN reaction causing severe rash involving mucus membranes or skin necrosis: Yes Has patient had a PCN reaction that required hospitalization: [name] Has patient had a PCN reaction occurring within the last 10 years: No If all of the above answers are "NO", then may proceed with Cephalosporin use.        Medication List        Accurate as of September 25, 2021  1:21 PM. If you have any questions, ask your nurse or doctor.          STOP taking these medications    cholecalciferol 25 MCG (1000 UNIT) tablet Commonly known as: VITAMIN D3 Stopped by: [name], MD       TAKE these medications    ALPRAZolam 1 MG tablet Commonly known as:  XANAX Take 1 tablet (1 mg total) by mouth 3 (three) times daily as needed for anxiety.   bisacodyl 5 MG EC tablet Commonly known as: DULCOLAX Take 5 mg by mouth daily as needed for moderate constipation.   carvedilol 6.25 MG tablet Commonly known as: COREG Take 1 tablet (6.25 mg total) by mouth 2 (two) times daily with a meal in the morning and evening   Fish Oil 1000 MG Caps Take 1,000 mg by mouth daily.   lisinopril 2.5 MG tablet Commonly known as: ZESTRIL Take 1 tablet (2.5 mg total) by mouth daily.   MENS 50+ MULTI VITAMIN/MIN PO Take 1 tablet by mouth daily.   rosuvastatin 40 MG tablet Commonly known as: CRESTOR Take 1 tablet (40 mg total) by mouth daily.   tamsulosin 0.4 MG Caps capsule Commonly known as: FLOMAX Take 0.4 mg by mouth at bedtime.         ROS:  A comprehensive review of systems was negative except for: Gastrointestinal: positive for colostomy in place, bulge around ostomy  Blood pressure 140/87, pulse 80, temperature 98.7 F (37.1 C), temperature source Other (Comment), resp. rate 16, height 5\' 9"  (1.753 m), weight 235 lb (106.6 kg), SpO2 95 %. Physical Exam Vitals reviewed.  Constitutional:      Appearance: Normal appearance.  HENT:     Head: Normocephalic.     Nose: Nose normal.  Eyes:     Extraocular Movements: Extraocular movements intact.  Cardiovascular:     Rate and Rhythm: Normal rate and regular rhythm.  Pulmonary:     Effort: Pulmonary effort is normal.     Breath sounds: Normal breath sounds.  Abdominal:     General: There is distension.     Palpations: Abdomen is soft.     Tenderness: There is no abdominal tenderness.     Comments: Colostomy in place, parastomal hernia noted  Musculoskeletal:        General: No swelling. Normal range of motion.     Cervical back: Normal range of motion.  Skin:    General: Skin is warm.  Neurological:     General: No focal deficit present.     Mental Status: He is oriented to person,  place, and time.  Psychiatric:        Mood and Affect: Mood normal.        Behavior: Behavior normal.    Results: None    Assessment & Plan:  Francisco Allen is a 60 y.o. male with colostomy in place that wants it reversed. Discussed reversal and  risk of bleeding, infection, injury to other organs, ureter, and risk of anastomotic leak. Discussed bowel preparation. Discussed parastomal hernia and that it will be repaired primarily but could return. Discussed limitations of no lifting > 10 lbs or excessive bending, squatting pushing or pulling for 8 weeks after surgery. Discussed blood transfusions and risk. Discussed post operative course and days in the hospital.   Colon Preparation:  Buy from the Store: Miralax bottle (288g).  Gatorade 64 oz (not red). Dulcolax tablets.   The Day Prior to Surgery: Take 4 ducolax tablets at 7am with water. Do an enema through your rectum at 8AM. Drink plenty of clear liquids all day to avoid dehydration, no solid food.    Mix the bottle of Miralax and 64 oz of Gatorade and drink this mixture starting at 10am.  Drink it gradually over the next few hours, 8 ounces every 15-30 minutes until it is gone. Finish this by 2pm.  Repeat an enema at 2pm.  Take 2 neomycin 500mg  tablets and 2 metronidazole 500mg  tablets at 2 pm. Take 2 neomycin 500mg  tablets and 2 metronidazole 500mg  tablets at 3pm. Take 2 neomycin 500mg  tablets and 2 metronidazole 500mg  tablets at 10pm.    Do not eat or drink anything after midnight the night before your surgery.  Do not eat or drink anything that morning, and take medications as instructed by the hospital staff on your preoperative visit.    All questions were answered to the satisfaction of the patient.    [name] 09/25/2021, 1:21 PM

## 2021-10-01 ENCOUNTER — Encounter (HOSPITAL_COMMUNITY): Payer: Self-pay | Admitting: Anesthesiology

## 2021-10-07 ENCOUNTER — Telehealth: Payer: Self-pay | Admitting: *Deleted

## 2021-10-07 NOTE — Telephone Encounter (Signed)
Received call from patient [phone]~ telephone.   Reports that he requires anesthesiology that is in network with his insurance. Advised that [employer] has no control over anesthesiologist. Advised to contact his insurance to request detailed instructions on what is being requested.   Of note, patient noted to be very upset and hung up an Probation officer.

## 2021-10-07 NOTE — Telephone Encounter (Signed)
Patient returned call.   Reports that he has contacted insurance and was advised that if anesthesiology is out of network, [employer] will pay maximum benefit as  [facility] only used one company for anesthesia.   Also advised that [place] should cover anesthesia as [facility] is a [employer] site.

## 2021-10-09 NOTE — Patient Instructions (Signed)
Francisco Allen  10/09/2021     @PREFPERIOPPHARMACY @   Your procedure is scheduled on  10/13/2021.   Report to [facility] at  [phone]  A.M.   Call this number if you have problems the morning of surgery:  [phone]   Remember:    Follow the prep instructions given to you by the office. (A copy is enclosed at the back of these instructions.)    Do not eat  after midnight.   You may drink clear liquids until 0330 am.  Clear liquids allowed are:                    Water, Juice (non-citric and without pulp - diabetics please choose diet or no sugar options), Carbonated beverages - (diabetics please choose diet or no sugar options), Clear Tea, Black Coffee only (no creamer, milk or cream including half and half), Plain Jell-O only (diabetics please choose diet or no sugar options), Gatorade (diabetics please choose diet or no sugar options), and Plain Popsicles only    At 0330 AM, drink 1 surgical drink. You cannot have anything else to drink after 0330 AM.     Take these medicines the morning of surgery with A SIP OF WATER   Xanax(if needed), cardevilol, tramadol(if needed).      Do not wear jewelry, make-up or nail polish.  Do not wear lotions, powders, or perfumes, or deodorant.  Do not shave 48 hours prior to surgery.  Men may shave face and neck.  Do not bring valuables to the hospital.  [facility] is not responsible for any belongings or valuables.  Contacts, dentures or bridgework may not be worn into surgery.  Leave your suitcase in the car.  After surgery it may be brought to your room.  For patients admitted to the hospital, discharge time will be determined by your treatment team.  Patients discharged the day of surgery will not be allowed to drive home and must have someone with them for 24 hours.    Special instructions:   DO NOT smoke tobacco or vape for 24 hours before your procedure.  Please read over the following fact sheets that you were  given. Pain Booklet, Coughing and Deep Breathing, Blood Transfusion Information, Surgical Site Infection Prevention, Anesthesia Post-op Instructions, and Care and Recovery After Surgery      Colostomy Reversal Surgery, Care After This sheet gives you information about how to care for yourself after your procedure. Your health care provider may also give you more specific instructions. If you have problems or questions, contact your health care provider. What can I expect after the procedure? After the procedure, it is common to have: Pain and discomfort in your abdomen, especially near your incision. Loose stools (diarrhea). Decreased appetite. Constipation. Follow these instructions at home: Activity  Do not lift anything that is heavier than 10 lb (4.5 kg), or the limit that you are told, until your health care provider says that it is safe. Return to your normal activities as told by your health care provider. Ask your health care provider what activities are safe for you. Avoid sitting for a long time without moving. Get up to take short walks every 1-2 hours. This is important to improve blood flow and breathing. Ask for help if you feel weak or unsteady. Avoid strenuous activity, contact sports, and abdominal exercises for 4 weeks or as long as told by your health care provider.  Incision care  Follow instructions from your health care provider about how to take care of your incision. Make sure you: Wash your hands with soap and water before and after you change your bandage (dressing). If soap and water are not available, use hand sanitizer. Change your dressing as told by your health care provider. Leave stitches (sutures), skin glue, or adhesive strips in place. These skin closures may need to stay in place for 2 weeks or longer. If adhesive strip edges start to loosen and curl up, you may trim the loose edges. Do not remove adhesive strips completely unless your health care provider  tells you to do that. Keep the incision area clean and dry. Check your incision area every day for signs of infection. Check for: More redness, swelling, or pain. More fluid or blood. Warmth. Pus or a bad smell. Bathing Do not take baths, swim, or use a hot tub until your health care provider approves. Ask your health care provider if you may take showers. You may only be allowed to take sponge baths. If your health care provider approves bathing and showering, cover the dressing with a watertight covering to protect it from water. Do not let the dressing get wet. Keep the dressing dry until your health care provider says it can be removed. Driving Do not drive for 24 hours if you were given a sedative during your procedure. Follow other driving restrictions as told by your health care provider. Do not drive or use heavy machinery while taking prescription pain medicine. Eating and drinking Follow instructions from your health care provider about eating or drinking restrictions. This may include: What to eat and drink. You may be told to start eating a bland diet. Over time, you may slowly resume a more normal, healthy diet. How much to eat and drink. You should eat small meals often and stop eating when you feel full. Take nutrition supplements as told by your health care provider or dietitian. General instructions Take over-the-counter and prescription medicines only as told by your health care provider. Take steps to treat diarrhea or constipation as told by your health care provider. Your health care provider may recommend that you: Drink enough fluid to keep your urine pale yellow. Avoid fluids that contain a lot of sugar or caffeine, such as energy drinks, sports drinks, and soda. Eat bland, easy-to-digest foods in small amounts as you are able. These foods include bananas, applesauce, rice, lean meats, toast, and crackers. Take over-the-counter or prescription medicines. Limit foods  that are high in fat and processed sugars, such as fried or sweet foods. Do not use any products that contain nicotine or tobacco, such as cigarettes, e-cigarettes, and chewing tobacco. These can delay incision healing after surgery. If you need help quitting, ask your health care provider. Keep all follow-up visits as told by your health care provider. This is important. Contact a health care provider if: You have more redness, swelling, or pain at the site of your incision. You have more fluid or blood coming from your incision. Your incision feels warm to the touch. You have pus or a bad smell coming from your incision. You have a fever. Your incision breaks open. You feel nauseous. You are not able to have a bowel movement (are constipated). Your diarrhea gets worse. You have pain that is not controlled with medicine. Get help right away if you have: Abdominal pain that does not go away or becomes severe. Frequent vomiting and you are not  able to eat or drink. Difficulty breathing. Summary After colostomy reversal surgery, it is common to have abdominal pain, decreased appetite, diarrhea, or constipation. Follow instructions from your health care provider about how to take care of your incision. Do not let the dressing get wet. Take over-the-counter and prescription medicines only as told by your health care provider. Contact your health care provider if you are not able to have a bowel movement (are constipated). Keep all follow-up visits as told by your health care provider. This is important. This information is not intended to replace advice given to you by your health care provider. Make sure you discuss any questions you have with your health care provider. Document Revised: 04/13/2018 Document Reviewed: 04/13/2018 Elsevier Patient Education  [place] Anesthesia, Adult, Care After This sheet gives you information about how to care for yourself after your  procedure. Your health care provider may also give you more specific instructions. If you have problems or questions, contact your health care provider. What can I expect after the procedure? After the procedure, the following side effects are common: Pain or discomfort at the IV site. Nausea. Vomiting. Sore throat. Trouble concentrating. Feeling cold or chills. Feeling weak or tired. Sleepiness and fatigue. Soreness and body aches. These side effects can affect parts of the body that were not involved in surgery. Follow these instructions at home: For the time period you were told by your health care provider:  Rest. Do not participate in activities where you could fall or become injured. Do not drive or use machinery. Do not drink alcohol. Do not take sleeping pills or medicines that cause drowsiness. Do not make important decisions or sign legal documents. Do not take care of children on your own. Eating and drinking Follow any instructions from your health care provider about eating or drinking restrictions. When you feel hungry, start by eating small amounts of foods that are soft and easy to digest (bland), such as toast. Gradually return to your regular diet. Drink enough fluid to keep your urine pale yellow. If you vomit, rehydrate by drinking water, juice, or clear broth. General instructions If you have sleep apnea, surgery and certain medicines can increase your risk for breathing problems. Follow instructions from your health care provider about wearing your sleep device: Anytime you are sleeping, including during daytime naps. While taking prescription pain medicines, sleeping medicines, or medicines that make you drowsy. Have a responsible adult stay with you for the time you are told. It is important to have someone help care for you until you are awake and alert. Return to your normal activities as told by your health care provider. Ask your health care provider what  activities are safe for you. Take over-the-counter and prescription medicines only as told by your health care provider. If you smoke, do not smoke without supervision. Keep all follow-up visits as told by your health care provider. This is important. Contact a health care provider if: You have nausea or vomiting that does not get better with medicine. You cannot eat or drink without vomiting. You have pain that does not get better with medicine. You are unable to pass urine. You develop a skin rash. You have a fever. You have redness around your IV site that gets worse. Get help right away if: You have difficulty breathing. You have chest pain. You have blood in your urine or stool, or you vomit blood. Summary After the procedure, it is common to have a  sore throat or nausea. It is also common to feel tired. Have a responsible adult stay with you for the time you are told. It is important to have someone help care for you until you are awake and alert. When you feel hungry, start by eating small amounts of foods that are soft and easy to digest (bland), such as toast. Gradually return to your regular diet. Drink enough fluid to keep your urine pale yellow. Return to your normal activities as told by your health care provider. Ask your health care provider what activities are safe for you. This information is not intended to replace advice given to you by your health care provider. Make sure you discuss any questions you have with your health care provider. Document Revised: 07/11/2020 Document Reviewed: 02/08/2020 Elsevier Patient Education  2022 [place]. How to Use Chlorhexidine for Bathing Chlorhexidine gluconate (CHG) is a germ-killing (antiseptic) solution that is used to clean the skin. It can get rid of the bacteria that normally live on the skin and can keep them away for about 24 hours. To clean your skin with CHG, you may be given: A CHG solution to use in the shower or as  part of a sponge bath. A prepackaged cloth that contains CHG. Cleaning your skin with CHG may help lower the risk for infection: While you are staying in the intensive care unit of the hospital. If you have a vascular access, such as a central line, to provide short-term or long-term access to your veins. If you have a catheter to drain urine from your bladder. If you are on a ventilator. A ventilator is a machine that helps you breathe by moving air in and out of your lungs. After surgery. What are the risks? Risks of using CHG include: A skin reaction. Hearing loss, if CHG gets in your ears and you have a perforated eardrum. Eye injury, if CHG gets in your eyes and is not rinsed out. The CHG product catching fire. Make sure that you avoid smoking and flames after applying CHG to your skin. Do not use CHG: If you have a chlorhexidine allergy or have previously reacted to chlorhexidine. On babies younger than 22 months of age. How to use CHG solution Use CHG only as told by your health care provider, and follow the instructions on the label. Use the full amount of CHG as directed. Usually, this is one bottle. During a shower Follow these steps when using CHG solution during a shower (unless your health care provider gives you different instructions): Start the shower. Use your normal soap and shampoo to wash your face and hair. Turn off the shower or move out of the shower stream. Pour the CHG onto a clean washcloth. Do not use any type of brush or rough-edged sponge. Starting at your neck, lather your body down to your toes. Make sure you follow these instructions: If you will be having surgery, pay special attention to the part of your body where you will be having surgery. Scrub this area for at least 1 minute. Do not use CHG on your head or face. If the solution gets into your ears or eyes, rinse them well with water. Avoid your genital area. Avoid any areas of skin that have broken  skin, cuts, or scrapes. Scrub your back and under your arms. Make sure to wash skin folds. Let the lather sit on your skin for 1-2 minutes or as long as told by your health care provider. Thoroughly  rinse your entire body in the shower. Make sure that all body creases and crevices are rinsed well. Dry off with a clean towel. Do not put any substances on your body afterward--such as powder, lotion, or perfume--unless you are told to do so by your health care provider. Only use lotions that are recommended by the manufacturer. Put on clean clothes or pajamas. If it is the night before your surgery, sleep in clean sheets.  During a sponge bath Follow these steps when using CHG solution during a sponge bath (unless your health care provider gives you different instructions): Use your normal soap and shampoo to wash your face and hair. Pour the CHG onto a clean washcloth. Starting at your neck, lather your body down to your toes. Make sure you follow these instructions: If you will be having surgery, pay special attention to the part of your body where you will be having surgery. Scrub this area for at least 1 minute. Do not use CHG on your head or face. If the solution gets into your ears or eyes, rinse them well with water. Avoid your genital area. Avoid any areas of skin that have broken skin, cuts, or scrapes. Scrub your back and under your arms. Make sure to wash skin folds. Let the lather sit on your skin for 1-2 minutes or as long as told by your health care provider. Using a different clean, wet washcloth, thoroughly rinse your entire body. Make sure that all body creases and crevices are rinsed well. Dry off with a clean towel. Do not put any substances on your body afterward--such as powder, lotion, or perfume--unless you are told to do so by your health care provider. Only use lotions that are recommended by the manufacturer. Put on clean clothes or pajamas. If it is the night before your  surgery, sleep in clean sheets. How to use CHG prepackaged cloths Only use CHG cloths as told by your health care provider, and follow the instructions on the label. Use the CHG cloth on clean, dry skin. Do not use the CHG cloth on your head or face unless your health care provider tells you to. When washing with the CHG cloth: Avoid your genital area. Avoid any areas of skin that have broken skin, cuts, or scrapes. Before surgery Follow these steps when using a CHG cloth to clean before surgery (unless your health care provider gives you different instructions): Using the CHG cloth, vigorously scrub the part of your body where you will be having surgery. Scrub using a back-and-forth motion for 3 minutes. The area on your body should be completely wet with CHG when you are done scrubbing. Do not rinse. Discard the cloth and let the area air-dry. Do not put any substances on the area afterward, such as powder, lotion, or perfume. Put on clean clothes or pajamas. If it is the night before your surgery, sleep in clean sheets.  For general bathing Follow these steps when using CHG cloths for general bathing (unless your health care provider gives you different instructions). Use a separate CHG cloth for each area of your body. Make sure you wash between any folds of skin and between your fingers and toes. Wash your body in the following order, switching to a new cloth after each step: The front of your neck, shoulders, and chest. Both of your arms, under your arms, and your hands. Your stomach and groin area, avoiding the genitals. Your right leg and foot. Your left leg and foot.  The back of your neck, your back, and your buttocks. Do not rinse. Discard the cloth and let the area air-dry. Do not put any substances on your body afterward--such as powder, lotion, or perfume--unless you are told to do so by your health care provider. Only use lotions that are recommended by the manufacturer. Put on  clean clothes or pajamas. Contact a health care provider if: Your skin gets irritated after scrubbing. You have questions about using your solution or cloth. You swallow any chlorhexidine. Call your local poison control center (1-437-430-4484 in the [place] Get help right away if: Your eyes itch badly, or they become very red or swollen. Your skin itches badly and is red or swollen. Your hearing changes. You have trouble seeing. You have swelling or tingling in your mouth or throat. You have trouble breathing. These symptoms may represent a serious problem that is an emergency. Do not wait to see if the symptoms will go away. Get medical help right away. Call your local emergency services (911 in the [place] Do not drive yourself to the hospital. Summary Chlorhexidine gluconate (CHG) is a germ-killing (antiseptic) solution that is used to clean the skin. Cleaning your skin with CHG may help to lower your risk for infection. You may be given CHG to use for bathing. It may be in a bottle or in a prepackaged cloth to use on your skin. Carefully follow your health care provider's instructions and the instructions on the product label. Do not use CHG if you have a chlorhexidine allergy. Contact your health care provider if your skin gets irritated after scrubbing. This information is not intended to replace advice given to you by your health care provider. Make sure you discuss any questions you have with your health care provider. Document Revised: 01/06/2021 Document Reviewed: 01/06/2021 Elsevier Patient Education  2022 [employer].

## 2021-10-10 ENCOUNTER — Encounter (HOSPITAL_COMMUNITY)
Admission: RE | Admit: 2021-10-10 | Discharge: 2021-10-10 | Disposition: A | Discharge status: Home / Self Care | Payer: No Typology Code available for payment source | Source: Ambulatory Visit | Attending: General Surgery | Admitting: General Surgery

## 2021-10-10 ENCOUNTER — Other Ambulatory Visit (HOSPITAL_COMMUNITY)
Admission: RE | Admit: 2021-10-10 | Discharge: 2021-10-10 | Disposition: A | Discharge status: Home / Self Care | Payer: No Typology Code available for payment source | Source: Ambulatory Visit | Attending: General Surgery | Admitting: General Surgery

## 2021-10-10 ENCOUNTER — Other Ambulatory Visit: Payer: Self-pay

## 2021-10-10 ENCOUNTER — Encounter (HOSPITAL_COMMUNITY): Payer: Self-pay

## 2021-10-10 VITALS — BP 137/92 | HR 77 | Temp 98.6°F | Resp 18 | Ht 69.0 in | Wt 235.0 lb

## 2021-10-10 DIAGNOSIS — E1129 Type 2 diabetes mellitus with other diabetic kidney complication: Secondary | ICD-10-CM | POA: Diagnosis not present

## 2021-10-10 DIAGNOSIS — Z20822 Contact with and (suspected) exposure to covid-19: Secondary | ICD-10-CM | POA: Diagnosis not present

## 2021-10-10 DIAGNOSIS — Z419 Encounter for procedure for purposes other than remedying health state, unspecified: Secondary | ICD-10-CM

## 2021-10-10 DIAGNOSIS — R7303 Prediabetes: Secondary | ICD-10-CM

## 2021-10-10 DIAGNOSIS — Z01812 Encounter for preprocedural laboratory examination: Secondary | ICD-10-CM | POA: Insufficient documentation

## 2021-10-10 DIAGNOSIS — R809 Proteinuria, unspecified: Secondary | ICD-10-CM | POA: Diagnosis not present

## 2021-10-10 DIAGNOSIS — Z933 Colostomy status: Secondary | ICD-10-CM | POA: Diagnosis not present

## 2021-10-10 HISTORY — DX: Chronic kidney disease, unspecified: N18.9

## 2021-10-10 HISTORY — DX: Anxiety disorder, unspecified: F41.9

## 2021-10-10 LAB — BASIC METABOLIC PANEL
Anion gap: 9 (ref 5–15)
BUN: 36 mg/dL — ABNORMAL HIGH (ref 8–23)
CO2: 22 mmol/L (ref 22–32)
Calcium: 9.1 mg/dL (ref 8.9–10.3)
Chloride: 98 mmol/L (ref 98–111)
Creatinine, Ser: 1.43 mg/dL — ABNORMAL HIGH (ref 0.61–1.24)
GFR, Estimated: 55 mL/min — ABNORMAL LOW (ref >60)
Glucose, Bld: 545 mg/dL (ref 70–99)
Potassium: 4.9 mmol/L (ref 3.5–5.1)
Sodium: 129 mmol/L — ABNORMAL LOW (ref 135–145)

## 2021-10-10 LAB — CBC WITH DIFFERENTIAL/PLATELET
Abs Immature Granulocytes: 0.07 10*3/uL (ref 0.00–0.07)
Basophils Absolute: 0.1 10*3/uL (ref 0.0–0.1)
Basophils Relative: 1 %
Eosinophils Absolute: 0.1 10*3/uL (ref 0.0–0.5)
Eosinophils Relative: 2 %
HCT: 40.1 % (ref 39.0–52.0)
Hemoglobin: 13.6 g/dL (ref 13.0–17.0)
Immature Granulocytes: 1 %
Lymphocytes Relative: 19 %
Lymphs Abs: 1.6 10*3/uL (ref 0.7–4.0)
MCH: 28.4 pg (ref 26.0–34.0)
MCHC: 33.9 g/dL (ref 30.0–36.0)
MCV: 83.7 fL (ref 80.0–100.0)
Monocytes Absolute: 0.7 10*3/uL (ref 0.1–1.0)
Monocytes Relative: 9 %
Neutro Abs: 5.8 10*3/uL (ref 1.7–7.7)
Neutrophils Relative %: 68 %
Platelets: 204 10*3/uL (ref 150–400)
RBC: 4.79 MIL/uL (ref 4.22–5.81)
RDW: 15.2 % (ref 11.5–15.5)
WBC: 8.3 10*3/uL (ref 4.0–10.5)
nRBC: 0 % (ref 0.0–0.2)

## 2021-10-10 LAB — PREPARE RBC (CROSSMATCH)

## 2021-10-10 LAB — SARS CORONAVIRUS 2 (TAT 6-24 HRS): SARS Coronavirus 2: NEGATIVE

## 2021-10-10 LAB — HEMOGLOBIN A1C
Hgb A1c MFr Bld: 8.8 % — ABNORMAL HIGH (ref 4.8–5.6)
Mean Plasma Glucose: 205.86 mg/dL

## 2021-10-10 MED ORDER — GENTAMICIN SULFATE 40 MG/ML IJ SOLN
5.0000 mg/kg | INTRAVENOUS | Status: DC
  Filled 2021-10-10: qty 10.75

## 2021-10-10 MED ORDER — CLINDAMYCIN PHOSPHATE 900 MG/50ML IV SOLN: 900.0000 mg | INTRAVENOUS | Status: DC

## 2021-10-13 ENCOUNTER — Encounter (HOSPITAL_COMMUNITY): Admission: RE | Payer: Self-pay | Source: Home / Self Care

## 2021-10-13 ENCOUNTER — Inpatient Hospital Stay (HOSPITAL_COMMUNITY)
Admission: RE | Admit: 2021-10-13 | Payer: No Typology Code available for payment source | Source: Home / Self Care | Admitting: General Surgery

## 2021-10-13 DIAGNOSIS — Z933 Colostomy status: Secondary | ICD-10-CM

## 2021-10-13 SURGERY — COLOSTOMY REVERSAL
Anesthesia: General

## 2021-10-15 ENCOUNTER — Ambulatory Visit (INDEPENDENT_AMBULATORY_CARE_PROVIDER_SITE_OTHER): Payer: No Typology Code available for payment source | Admitting: Family Medicine

## 2021-10-15 ENCOUNTER — Other Ambulatory Visit (HOSPITAL_COMMUNITY): Payer: Self-pay

## 2021-10-15 ENCOUNTER — Encounter: Payer: Self-pay | Admitting: Family Medicine

## 2021-10-15 VITALS — BP 131/80 | HR 79 | Ht 69.0 in | Wt 236.0 lb

## 2021-10-15 DIAGNOSIS — N183 Chronic kidney disease, stage 3 unspecified: Secondary | ICD-10-CM

## 2021-10-15 DIAGNOSIS — R809 Proteinuria, unspecified: Secondary | ICD-10-CM | POA: Diagnosis not present

## 2021-10-15 DIAGNOSIS — E1122 Type 2 diabetes mellitus with diabetic chronic kidney disease: Secondary | ICD-10-CM

## 2021-10-15 DIAGNOSIS — E1129 Type 2 diabetes mellitus with other diabetic kidney complication: Secondary | ICD-10-CM | POA: Diagnosis not present

## 2021-10-15 MED ORDER — OZEMPIC (0.25 OR 0.5 MG/DOSE) 2 MG/1.5ML ~~LOC~~ SOPN
0.5000 mg | PEN_INJECTOR | SUBCUTANEOUS | 3 refills | Status: DC
  Filled 2021-10-15: qty 1.5, 28d supply, fill #0

## 2021-10-15 NOTE — Progress Notes (Signed)
BP 131/80   Pulse 79   Ht 5\' 9"  (1.753 m)   Wt 236 lb (107 kg)   SpO2 96%   BMI 34.85 kg/m    Subjective:   Patient ID: Francisco Allen, male    DOB: [birth year], 60 y.o.   MRN: [id number]  HPI: Francisco Allen is a 60 y.o. male presenting on 10/15/2021 for A1C elevated   HPI Patient's been having elevated blood sugars.  His diet has changed significantly and he admits to that.  His last A1c was 6.5 and he was not any medicines.  He went into surgery and was found to have an A1c just last week of 8.8 with an elevated blood sugar in the 500s.  They delayed his surgery until he can get his blood sugar down.  He is coming in for that today.  Relevant past medical, surgical, family and social history reviewed and updated as indicated. Interim medical history since our last visit reviewed. Allergies and medications reviewed and updated.  Review of Systems  Constitutional:  Negative for chills and fever.  Respiratory:  Negative for shortness of breath and wheezing.   Cardiovascular:  Negative for chest pain and leg swelling.  Skin:  Negative for rash.  All other systems reviewed and are negative.  Per HPI unless specifically indicated above   Allergies as of 10/15/2021       Reactions   Bee Venom Anaphylaxis, Swelling, Other (See Comments)   Anaphylactic shock   Penicillins Anaphylaxis, Swelling   Airway closes up Has patient had a PCN reaction causing immediate rash, facial/tongue/throat swelling, SOB or lightheadedness with hypotension: Yes Has patient had a PCN reaction causing severe rash involving mucus membranes or skin necrosis: Yes Has patient had a PCN reaction that required hospitalization: [name] Has patient had a PCN reaction occurring within the last 10 years: No If all of the above answers are "NO", then may proceed with Cephalosporin use.        Medication List        Accurate as of October 15, 2021  9:51 AM. If you have any questions, ask your nurse or doctor.           ALPRAZolam 1 MG tablet Commonly known as: XANAX Take 1 tablet (1 mg total) by mouth 3 (three) times daily as needed for anxiety.   carvedilol 6.25 MG tablet Commonly known as: COREG Take 1 tablet (6.25 mg total) by mouth 2 (two) times daily with a meal in the morning and evening   Fish Oil 1000 MG Caps Take 1,000 mg by mouth daily.   lisinopril 2.5 MG tablet Commonly known as: ZESTRIL Take 1 tablet (2.5 mg total) by mouth daily.   MENS 50+ MULTI VITAMIN/MIN PO Take 1 tablet by mouth daily.   metroNIDAZOLE 500 MG tablet Commonly known as: Flagyl Take 2 tablets (1,000 mg total) by mouth as directed. Take 2 flagyl 500mg  tablets at 2 pm, 3pm, and 10 pm the day before surgery.   neomycin 500 MG tablet Commonly known as: MYCIFRADIN Take 2 tablets (1,000 mg total) by mouth as directed. Take 2 neomycin 500mg  tablets at 2 pm, 3pm, and 10 pm the day before surgery.   Ozempic (0.25 or 0.5 MG/DOSE) 2 MG/1.5ML Sopn Generic drug: Semaglutide(0.25 or 0.5MG /DOS) Inject 0.5 mg into the skin once a week. Started by: [name], MD   rosuvastatin 40 MG tablet Commonly known as: CRESTOR Take 1 tablet (40 mg total) by mouth daily.  tamsulosin 0.4 MG Caps capsule Commonly known as: FLOMAX Take 0.4 mg by mouth at bedtime.   traMADol 50 MG tablet Commonly known as: ULTRAM Take 50 mg by mouth 2 (two) times daily as needed for moderate pain.         Objective:   BP 131/80   Pulse 79   Ht 5\' 9"  (1.753 m)   Wt 236 lb (107 kg)   SpO2 96%   BMI 34.85 kg/m   Wt Readings from Last 3 Encounters:  10/15/21 236 lb (107 kg)  10/10/21 235 lb (106.6 kg)  09/25/21 235 lb (106.6 kg)    Physical Exam Vitals and nursing note reviewed.  Constitutional:      General: He is not in acute distress.    Appearance: He is well-developed. He is not diaphoretic.  Eyes:     General: No scleral icterus.    Conjunctiva/sclera: Conjunctivae normal.  Neck:     Thyroid: No  thyromegaly.  Cardiovascular:     Rate and Rhythm: Normal rate and regular rhythm.     Heart sounds: Normal heart sounds. No murmur heard. Pulmonary:     Effort: Pulmonary effort is normal. No respiratory distress.     Breath sounds: Normal breath sounds. No wheezing.  Musculoskeletal:        General: Normal range of motion.  Skin:    General: Skin is warm and dry.     Findings: No rash.  Neurological:     Mental Status: He is alert and oriented to person, place, and time.     Coordination: Coordination normal.  Psychiatric:        Behavior: Behavior normal.      Assessment & Plan:   Problem List Items Addressed This Visit       Endocrine   CKD stage 3 due to type 2 diabetes mellitus ([place])   Relevant Medications   Semaglutide,0.25 or 0.5MG /DOS, (OZEMPIC, 0.25 OR 0.5 MG/DOSE,) 2 MG/1.5ML SOPN   Type 2 diabetes mellitus with microalbuminuria ([place]) - Primary   Relevant Medications   Semaglutide,0.25 or 0.5MG /DOS, (OZEMPIC, 0.25 OR 0.5 MG/DOSE,) 2 MG/1.5ML SOPN    Started Ozempic, gave a sample to start at 0.25 for the first few weeks and then go up to 0.5. Follow up plan: Return in about 3 weeks (around 11/05/2021), or if symptoms worsen or fail to improve, for Patient already has his normal follow-up later this month, will see him then..  Counseling provided for all of the vaccine components No orders of the defined types were placed in this encounter.   Caryl Pina, MD [facility] 10/15/2021, 9:51 AM

## 2021-10-16 ENCOUNTER — Other Ambulatory Visit (HOSPITAL_COMMUNITY): Payer: Self-pay

## 2021-10-16 LAB — TYPE AND SCREEN
ABO/RH(D): A POS
Antibody Screen: POSITIVE
Unit division: 0
Unit division: 0

## 2021-10-16 LAB — BPAM RBC
Blood Product Expiration Date: 202212262359
Unit Type and Rh: 6200

## 2021-10-21 ENCOUNTER — Telehealth: Payer: Self-pay | Admitting: General Surgery

## 2021-10-21 NOTE — Telephone Encounter (Signed)
Patient called in regards to questions about short term disability and FMLA paperwork. Would like a call back to discuss.

## 2021-10-22 NOTE — Telephone Encounter (Signed)
Returned pat phone call - he has a loan with [employer] that his ortho was filling out paperwork for in order for his disability to cover payments and he may need for [place] to fill out the paperwork so that they will continue to cover for him until this surgery is finished and he has recovered. He will call us back and let us know if we need to do anything further he just wanted to make sure we would fill it out for him. Per Dr. Constance Haw we will be happy to fill out.

## 2021-10-30 ENCOUNTER — Other Ambulatory Visit (HOSPITAL_COMMUNITY): Payer: Self-pay

## 2021-10-30 ENCOUNTER — Encounter: Payer: Self-pay | Admitting: Family Medicine

## 2021-10-30 ENCOUNTER — Ambulatory Visit (INDEPENDENT_AMBULATORY_CARE_PROVIDER_SITE_OTHER): Payer: No Typology Code available for payment source | Admitting: Family Medicine

## 2021-10-30 VITALS — BP 138/88 | HR 68 | Ht 69.0 in | Wt 235.0 lb

## 2021-10-30 DIAGNOSIS — R809 Proteinuria, unspecified: Secondary | ICD-10-CM | POA: Diagnosis not present

## 2021-10-30 DIAGNOSIS — E1129 Type 2 diabetes mellitus with other diabetic kidney complication: Secondary | ICD-10-CM | POA: Diagnosis not present

## 2021-10-30 DIAGNOSIS — Z23 Encounter for immunization: Secondary | ICD-10-CM

## 2021-10-30 MED ORDER — SEMAGLUTIDE (1 MG/DOSE) 4 MG/3ML ~~LOC~~ SOPN
1.0000 mg | PEN_INJECTOR | SUBCUTANEOUS | 3 refills | Status: DC
  Filled 2021-10-30: qty 3, 28d supply, fill #0

## 2021-10-30 NOTE — Progress Notes (Signed)
BP 138/88    Pulse 68    Ht 5\' 9"  (1.753 m)    Wt 235 lb (106.6 kg)    SpO2 98%    BMI 34.70 kg/m    Subjective:   Patient ID: Francisco Allen, male    DOB: [birth year], 60 y.o.   MRN: [id number]  HPI: Francisco Allen is a 60 y.o. male presenting on 10/30/2021 for Medical Management of Chronic Issues, Diabetes, and Hyperlipidemia   HPI Type 2 diabetes mellitus Patient comes in today for recheck of his diabetes. Patient has been currently taking Ozempic, just started the 0.5 today. Patient is currently on an ACE inhibitor/ARB. Patient has seen an ophthalmologist this year. Patient denies any issues with their feet. The symptom started onset as an adult CKD and hyperlipidemia and hypertension ARE RELATED TO DM   Relevant past medical, surgical, family and social history reviewed and updated as indicated. Interim medical history since our last visit reviewed. Allergies and medications reviewed and updated.  Review of Systems  Constitutional:  Negative for chills and fever.  Eyes:  Negative for visual disturbance.  Respiratory:  Negative for shortness of breath and wheezing.   Cardiovascular:  Negative for chest pain and leg swelling.  Musculoskeletal:  Negative for back pain and gait problem.  Skin:  Negative for color change and rash.  Neurological:  Negative for weakness.  All other systems reviewed and are negative.  Per HPI unless specifically indicated above   Allergies as of 10/30/2021       Reactions   Bee Venom Anaphylaxis, Swelling, Other (See Comments)   Anaphylactic shock   Penicillins Anaphylaxis, Swelling   Airway closes up Has patient had a PCN reaction causing immediate rash, facial/tongue/throat swelling, SOB or lightheadedness with hypotension: Yes Has patient had a PCN reaction causing severe rash involving mucus membranes or skin necrosis: Yes Has patient had a PCN reaction that required hospitalization: Francisco Allen Has patient had a PCN reaction occurring within the  last 10 years: No If all of the above answers are "NO", then may proceed with Cephalosporin use.        Medication List        Accurate as of October 30, 2021  1:20 PM. If you have any questions, ask your nurse or doctor.          STOP taking these medications    Ozempic (0.25 or 0.5 MG/DOSE) 2 MG/1.5ML Sopn Generic drug: Semaglutide(0.25 or 0.5MG /DOS) Replaced by: Semaglutide (1 MG/DOSE) 4 MG/3ML Sopn Stopped by: Fransisca Kaufmann Mihaela Fajardo, MD       TAKE these medications    ALPRAZolam 1 MG tablet Commonly known as: XANAX Take 1 tablet (1 mg total) by mouth 3 (three) times daily as needed for anxiety.   carvedilol 6.25 MG tablet Commonly known as: COREG Take 1 tablet (6.25 mg total) by mouth 2 (two) times daily with a meal in the morning and evening   Fish Oil 1000 MG Caps Take 1,000 mg by mouth daily.   lisinopril 2.5 MG tablet Commonly known as: ZESTRIL Take 1 tablet (2.5 mg total) by mouth daily.   MENS 50+ MULTI VITAMIN/MIN PO Take 1 tablet by mouth daily.   metroNIDAZOLE 500 MG tablet Commonly known as: Flagyl Take 2 tablets (1,000 mg total) by mouth as directed. Take 2 flagyl 500mg  tablets at 2 pm, 3pm, and 10 pm the day before surgery.   neomycin 500 MG tablet Commonly known as: MYCIFRADIN Take 2 tablets (1,000 mg  total) by mouth as directed. Take 2 neomycin 500mg  tablets at 2 pm, 3pm, and 10 pm the day before surgery.   rosuvastatin 40 MG tablet Commonly known as: CRESTOR Take 1 tablet (40 mg total) by mouth daily.   Semaglutide (1 MG/DOSE) 4 MG/3ML Sopn Inject 1 mg as directed once a week. Start taking on: November 13, 2021 Replaces: Ozempic (0.25 or 0.5 MG/DOSE) 2 MG/1.5ML Sopn Started by: Fransisca Kaufmann Lakiah Dhingra, MD   tamsulosin 0.4 MG Caps capsule Commonly known as: FLOMAX Take 0.4 mg by mouth at bedtime.   traMADol 50 MG tablet Commonly known as: ULTRAM Take 50 mg by mouth 2 (two) times daily as needed for moderate pain.         Objective:    BP 138/88    Pulse 68    Ht 5\' 9"  (1.753 m)    Wt 235 lb (106.6 kg)    SpO2 98%    BMI 34.70 kg/m   Wt Readings from Last 3 Encounters:  10/30/21 235 lb (106.6 kg)  10/15/21 236 lb (107 kg)  10/10/21 235 lb (106.6 kg)    Physical Exam Vitals and nursing note reviewed.  Constitutional:      General: He is not in acute distress.    Appearance: He is well-developed. He is not diaphoretic.  Eyes:     General: No scleral icterus.    Conjunctiva/sclera: Conjunctivae normal.  Neck:     Thyroid: No thyromegaly.  Cardiovascular:     Rate and Rhythm: Normal rate and regular rhythm.     Heart sounds: Normal heart sounds. No murmur heard. Pulmonary:     Effort: Pulmonary effort is normal. No respiratory distress.     Breath sounds: Normal breath sounds. No wheezing.  Musculoskeletal:        General: Normal range of motion.     Cervical back: Neck supple.  Lymphadenopathy:     Cervical: No cervical adenopathy.  Skin:    General: Skin is warm and dry.     Findings: No rash.  Neurological:     Mental Status: He is alert and oriented to person, place, and time.     Coordination: Coordination normal.  Psychiatric:        Behavior: Behavior normal.      Assessment & Plan:   Problem List Items Addressed This Visit       Endocrine   Type 2 diabetes mellitus with microalbuminuria ([place]) - Primary   Relevant Medications   Semaglutide, 1 MG/DOSE, 4 MG/3ML SOPN (Start on 11/13/2021)  Blood sugar still in the 250 range most of the time according to [place] sensor.  He did just increase to Ozempic 0.5, recommend to do that for 2 to 3 weeks until he finishes the current pen and then go up to the 1 mg dose.  He has not had any stomach issues at this point Finish 2 more weeks on the 0.5 and then go up to the 1 mg Ozempic Follow up plan: Return if symptoms worsen or fail to improve, for 3 to 4-week diabetes follow-up.  Counseling provided for all of the vaccine components No orders of the  defined types were placed in this encounter.   Caryl Pina, MD [facility] 10/30/2021, 1:20 PM

## 2021-11-12 ENCOUNTER — Other Ambulatory Visit (HOSPITAL_COMMUNITY): Payer: Self-pay

## 2021-11-12 MED ORDER — CIPROFLOXACIN HCL 500 MG PO TABS
500.0000 mg | ORAL_TABLET | Freq: Two times a day (BID) | ORAL | 0 refills | Status: DC
  Filled 2021-11-12 – 2021-11-14 (×2): qty 14, 7d supply, fill #0

## 2021-11-13 ENCOUNTER — Other Ambulatory Visit (HOSPITAL_COMMUNITY): Payer: Self-pay

## 2021-11-14 ENCOUNTER — Other Ambulatory Visit (HOSPITAL_COMMUNITY): Payer: Self-pay

## 2021-11-14 MED ORDER — TAMSULOSIN HCL 0.4 MG PO CAPS
0.4000 mg | ORAL_CAPSULE | Freq: Two times a day (BID) | ORAL | 11 refills | Status: DC
  Filled 2021-11-14 – 2021-11-18 (×2): qty 60, 30d supply, fill #0
  Filled 2022-01-05: qty 60, 30d supply, fill #1
  Filled 2022-02-09: qty 60, 30d supply, fill #2
  Filled 2022-03-11: qty 60, 30d supply, fill #3
  Filled 2022-04-11: qty 60, 30d supply, fill #4
  Filled 2022-05-13: qty 60, 30d supply, fill #5
  Filled 2022-06-09: qty 60, 30d supply, fill #6
  Filled 2022-07-08: qty 60, 30d supply, fill #7
  Filled 2022-08-06: qty 60, 30d supply, fill #8
  Filled 2022-09-07: qty 60, 30d supply, fill #9
  Filled 2022-10-09: qty 60, 30d supply, fill #10
  Filled 2022-11-05: qty 60, 30d supply, fill #11

## 2021-11-18 ENCOUNTER — Other Ambulatory Visit (HOSPITAL_COMMUNITY): Payer: Self-pay

## 2021-11-19 ENCOUNTER — Other Ambulatory Visit (HOSPITAL_COMMUNITY): Payer: Self-pay

## 2021-11-24 ENCOUNTER — Other Ambulatory Visit: Payer: No Typology Code available for payment source

## 2021-11-25 ENCOUNTER — Other Ambulatory Visit (HOSPITAL_COMMUNITY): Payer: Self-pay

## 2021-11-25 MED ORDER — LISINOPRIL 5 MG PO TABS
5.0000 mg | ORAL_TABLET | Freq: Every day | ORAL | 3 refills | Status: DC
  Filled 2021-11-25 – 2021-11-28 (×2): qty 90, 90d supply, fill #0

## 2021-11-27 ENCOUNTER — Ambulatory Visit: Payer: No Typology Code available for payment source | Admitting: Family Medicine

## 2021-11-28 ENCOUNTER — Other Ambulatory Visit (HOSPITAL_COMMUNITY): Payer: Self-pay

## 2021-12-01 ENCOUNTER — Other Ambulatory Visit (HOSPITAL_COMMUNITY): Payer: Self-pay

## 2021-12-03 ENCOUNTER — Ambulatory Visit (INDEPENDENT_AMBULATORY_CARE_PROVIDER_SITE_OTHER): Payer: No Typology Code available for payment source | Admitting: Family Medicine

## 2021-12-03 ENCOUNTER — Encounter: Payer: Self-pay | Admitting: Family Medicine

## 2021-12-03 ENCOUNTER — Telehealth: Payer: Self-pay | Admitting: *Deleted

## 2021-12-03 VITALS — BP 136/80 | HR 78 | Ht 69.0 in | Wt 234.0 lb

## 2021-12-03 DIAGNOSIS — E1122 Type 2 diabetes mellitus with diabetic chronic kidney disease: Secondary | ICD-10-CM

## 2021-12-03 DIAGNOSIS — I10 Essential (primary) hypertension: Secondary | ICD-10-CM | POA: Diagnosis not present

## 2021-12-03 DIAGNOSIS — R809 Proteinuria, unspecified: Secondary | ICD-10-CM

## 2021-12-03 DIAGNOSIS — N183 Chronic kidney disease, stage 3 unspecified: Secondary | ICD-10-CM

## 2021-12-03 DIAGNOSIS — E1129 Type 2 diabetes mellitus with other diabetic kidney complication: Secondary | ICD-10-CM

## 2021-12-03 DIAGNOSIS — F411 Generalized anxiety disorder: Secondary | ICD-10-CM | POA: Diagnosis not present

## 2021-12-03 DIAGNOSIS — E782 Mixed hyperlipidemia: Secondary | ICD-10-CM

## 2021-12-03 LAB — BAYER DCA HB A1C WAIVED: HB A1C (BAYER DCA - WAIVED): 7.8 % — ABNORMAL HIGH (ref 4.8–5.6)

## 2021-12-03 MED ORDER — ALPRAZOLAM 1 MG PO TABS: 1.0000 mg | ORAL_TABLET | Freq: Three times a day (TID) | ORAL | 2 refills | Status: DC | PRN

## 2021-12-03 MED ORDER — SEMAGLUTIDE (1 MG/DOSE) 4 MG/3ML ~~LOC~~ SOPN
1.0000 mg | PEN_INJECTOR | SUBCUTANEOUS | 3 refills | Status: DC
  Filled 2021-12-08: qty 9, 84d supply, fill #0

## 2021-12-03 MED ORDER — SEMAGLUTIDE (2 MG/DOSE) 8 MG/3ML ~~LOC~~ SOPN: 2.0000 mg | PEN_INJECTOR | SUBCUTANEOUS | 3 refills | Status: DC

## 2021-12-03 NOTE — Telephone Encounter (Signed)
Received call from patient [phone]~ telephone.   Reports that he was seen at PCP on 12/03/2021 and A1C noted at 7.8%, down from 8.8% pm 10/10/2021 at pre-op.   Inquired if this is adequate for colostomy reversal to be scheduled.   Please advise.

## 2021-12-03 NOTE — Addendum Note (Signed)
Addended by: Caryl Pina on: 12/03/2021 09:44 AM   Modules accepted: Orders

## 2021-12-03 NOTE — Progress Notes (Addendum)
BP 136/80    Pulse 78    Ht 5\' 9"  (1.753 m)    Wt 234 lb (106.1 kg)    SpO2 97%    BMI 34.56 kg/m    Subjective:   Patient ID: Francisco Allen, male    DOB: 04/06/1961, 61 y.o.   MRN: [id number]  HPI: Francisco Allen is a 61 y.o. male presenting on 12/03/2021 for Medical Management of Chronic Issues, Diabetes, and Hyperlipidemia   HPI Type 2 diabetes mellitus Patient comes in today for recheck of his diabetes. Patient has been currently taking Ozempic. Patient is currently on an ACE inhibitor/ARB. Patient has not seen an ophthalmologist this year. Patient denies any issues with their feet. The symptom started onset as an adult hypertension and hyperlipidemia ARE RELATED TO DM   Hypertension Patient is currently on carvedilol and lisinopril, and their blood pressure today is 136/80. Patient denies any lightheadedness or dizziness. Patient denies headaches, blurred vision, chest pains, shortness of breath, or weakness. Denies any side effects from medication and is content with current medication.   Hyperlipidemia Patient is coming in for recheck of his hyperlipidemia. The patient is currently taking Crestor and fish oils. They deny any issues with myalgias or history of liver damage from it. They deny any focal numbness or weakness or chest pain.   Anxiety recheck Current rx- xanax 1mg  tid prn # meds rx- 90  Effectiveness of current meds-works well, only uses occasionallly Adverse reactions form meds-none  Pill count performed-No Last drug screen - 05/01/21 ( high risk q37m, moderate risk q62m, low risk yearly ) Urine drug screen today- No Was the [place] reviewed- yes  If yes were their any concerning findings? - none  No flowsheet data found.   Controlled substance contract signed on: 05/01/21   Relevant past medical, surgical, family and social history reviewed and updated as indicated. Interim medical history since our last visit reviewed. Allergies and medications reviewed and  updated.  Review of Systems  Constitutional:  Negative for chills and fever.  Respiratory:  Negative for shortness of breath and wheezing.   Cardiovascular:  Negative for chest pain and leg swelling.  Musculoskeletal:  Negative for back pain and gait problem.  Skin:  Negative for rash.  Neurological:  Negative for dizziness, weakness and numbness.  All other systems reviewed and are negative.  Per HPI unless specifically indicated above   Allergies as of 12/03/2021       Reactions   Bee Venom Anaphylaxis, Swelling, Other (See Comments)   Anaphylactic shock   Penicillins Anaphylaxis, Swelling   Airway closes up Has patient had a PCN reaction causing immediate rash, facial/tongue/throat swelling, SOB or lightheadedness with hypotension: Yes Has patient had a PCN reaction causing severe rash involving mucus membranes or skin necrosis: Yes Has patient had a PCN reaction that required hospitalization: [name] Has patient had a PCN reaction occurring within the last 10 years: No If all of the above answers are "NO", then may proceed with Cephalosporin use.        Medication List        Accurate as of December 03, 2021  9:38 AM. If you have any questions, ask your nurse or doctor.          STOP taking these medications    Ozempic (1 MG/DOSE) 4 MG/3ML Sopn Generic drug: Semaglutide (1 MG/DOSE) Replaced by: Semaglutide (2 MG/DOSE) 8 MG/3ML Sopn Stopped by: [name], MD  TAKE these medications    ALPRAZolam 1 MG tablet Commonly known as: XANAX Take 1 tablet (1 mg total) by mouth 3 (three) times daily as needed for anxiety.   carvedilol 6.25 MG tablet Commonly known as: COREG Take 1 tablet (6.25 mg total) by mouth 2 (two) times daily with a meal in the morning and evening   ciprofloxacin 500 MG tablet Commonly known as: Cipro Take 1 tablet (500 mg total) by mouth 2 (two) times daily.   Fish Oil 1000 MG Caps Take 1,000 mg by mouth daily.   lisinopril 5  MG tablet Commonly known as: ZESTRIL Take 1 tablet (5 mg total) by mouth daily.   MENS 50+ MULTI VITAMIN/MIN PO Take 1 tablet by mouth daily.   metroNIDAZOLE 500 MG tablet Commonly known as: Flagyl Take 2 tablets (1,000 mg total) by mouth as directed. Take 2 flagyl 500mg  tablets at 2 pm, 3pm, and 10 pm the day before surgery.   neomycin 500 MG tablet Commonly known as: MYCIFRADIN Take 2 tablets (1,000 mg total) by mouth as directed. Take 2 neomycin 500mg  tablets at 2 pm, 3pm, and 10 pm the day before surgery.   rosuvastatin 40 MG tablet Commonly known as: CRESTOR Take 1 tablet (40 mg total) by mouth daily.   Semaglutide (2 MG/DOSE) 8 MG/3ML Sopn Inject 2 mg as directed once a week. Replaces: Ozempic (1 MG/DOSE) 4 MG/3ML Sopn Started by: [name], MD   tamsulosin 0.4 MG Caps capsule Commonly known as: FLOMAX Take 0.4 mg by mouth at bedtime.   tamsulosin 0.4 MG Caps capsule Commonly known as: FLOMAX Take 1 capsule (0.4 mg total) by mouth 2 (two) times daily.   traMADol 50 MG tablet Commonly known as: ULTRAM Take 50 mg by mouth 2 (two) times daily as needed for moderate pain.         Objective:   BP 136/80    Pulse 78    Ht 5\' 9"  (1.753 m)    Wt 234 lb (106.1 kg)    SpO2 97%    BMI 34.56 kg/m   Wt Readings from Last 3 Encounters:  12/03/21 234 lb (106.1 kg)  10/30/21 235 lb (106.6 kg)  10/15/21 236 lb (107 kg)    Physical Exam Vitals and nursing note reviewed.  Constitutional:      General: He is not in acute distress.    Appearance: He is well-developed. He is not diaphoretic.  Eyes:     General: No scleral icterus.    Conjunctiva/sclera: Conjunctivae normal.  Neck:     Thyroid: No thyromegaly.  Cardiovascular:     Rate and Rhythm: Normal rate and regular rhythm.     Heart sounds: Normal heart sounds. No murmur heard. Pulmonary:     Effort: Pulmonary effort is normal. No respiratory distress.     Breath sounds: Normal breath sounds. No  wheezing.  Musculoskeletal:        General: No swelling. Normal range of motion.     Cervical back: Neck supple.  Lymphadenopathy:     Cervical: No cervical adenopathy.  Skin:    General: Skin is warm and dry.     Findings: No rash.  Neurological:     Mental Status: He is alert and oriented to person, place, and time.     Coordination: Coordination normal.  Psychiatric:        Behavior: Behavior normal.      Assessment & Plan:   Problem List Items Addressed This Visit  Cardiovascular and Mediastinum   HTN (hypertension)     Endocrine   Type 2 diabetes mellitus with microalbuminuria ([facility]) - Primary   Relevant Medications   Semaglutide, 2 MG/DOSE, 8 MG/3ML SOPN   Other Relevant Orders   Bayer DCA Hb A1c Waived   CKD stage 3 due to type 2 diabetes mellitus ([facility])   Relevant Medications   Semaglutide, 2 MG/DOSE, 8 MG/3ML SOPN     Other   GAD (generalized anxiety disorder)   Relevant Medications   ALPRAZolam (XANAX) 1 MG tablet   Hyperlipidemia    Patient's A1c much improved at 7.8, he would be cleared for surgery at this point with this A1c.  Patient said he never started taking the 1 mg yet, he still been taking the 0.5 mg Ozempic, so we will increase him to the 1 mg now Follow up plan: Return in about 3 months (around 03/03/2022), or if symptoms worsen or fail to improve, for dm and ckd.  Counseling provided for all of the vaccine components Orders Placed This Encounter  Procedures   Bayer [place] Hb A1c [place] Henrique Parekh, MD [facility] 12/03/2021, 9:38 AM

## 2021-12-05 NOTE — Telephone Encounter (Signed)
Call placed to patient and patient made aware.  

## 2021-12-08 ENCOUNTER — Other Ambulatory Visit (HOSPITAL_COMMUNITY): Payer: Self-pay

## 2021-12-08 MED ORDER — OZEMPIC (2 MG/DOSE) 8 MG/3ML ~~LOC~~ SOPN
PEN_INJECTOR | SUBCUTANEOUS | 3 refills | Status: DC
  Filled 2021-12-08: qty 9, 84d supply, fill #0

## 2021-12-10 ENCOUNTER — Ambulatory Visit: Payer: No Typology Code available for payment source | Admitting: Family Medicine

## 2021-12-12 ENCOUNTER — Ambulatory Visit (INDEPENDENT_AMBULATORY_CARE_PROVIDER_SITE_OTHER): Payer: No Typology Code available for payment source | Admitting: Pharmacist

## 2021-12-12 DIAGNOSIS — E1129 Type 2 diabetes mellitus with other diabetic kidney complication: Secondary | ICD-10-CM | POA: Diagnosis not present

## 2021-12-12 DIAGNOSIS — R809 Proteinuria, unspecified: Secondary | ICD-10-CM

## 2021-12-12 MED ORDER — DAPAGLIFLOZIN PROPANEDIOL 5 MG PO TABS: 5.0000 mg | ORAL_TABLET | Freq: Every day | ORAL | 0 refills | Status: DC

## 2021-12-12 NOTE — Progress Notes (Signed)
° ° °  12/12/2021 Name: Francisco Allen MRN: [id number] DOB: 1961/04/30   S:  59 yoM Presents for diabetes evaluation, education, and management Patient was referred and last seen by Primary Care Provider on 12/03/21.  He has vastly improved his a1c, however needs to get it to 7% for surgery to take place.  He is wearing libre 3 CGM and tolerating ozempic.  Insurance coverage/medication affordability: [place]  Patient reports adherence with medications. Current diabetes medications include: ozempic 1mg     Patient denies hypoglycemic events.   Patient reported dietary habits: Eats 3 meals/day Discussed meal planning options and Plate method for healthy eating Avoid sugary drinks and desserts Incorporate balanced protein, non starchy veggies, 1 serving of carbohydrate with each meal Increase water intake Increase physical activity as able   Patient-reported exercise habits: n/a   Patient reports nocturia (nighttime urination).  Patient reports neuropathy (nerve pain).  Patient denies visual changes.  Patient reports self foot exams.    O:  Lab Results  Component Value Date   HGBA1C 7.8 (H) 12/03/2021   Lipid Panel     Component Value Date/Time   CHOL 103 07/31/2021 1120   CHOL 244 (H) 04/04/2013 1630   TRIG 176 (H) 07/31/2021 1120   TRIG 173 (H) 10/20/2014 0819   TRIG 370 (H) 04/04/2013 1630   HDL 27 (L) 07/31/2021 1120   HDL 28 (L) 10/20/2014 0819   HDL 29 (L) 04/04/2013 1630   CHOLHDL 3.8 07/31/2021 1120   CHOLHDL 9.4 09/23/2017 1636   VLDL 73 (H) 09/23/2017 1636   LDLCALC 47 07/31/2021 1120   LDLCALC 59 04/12/2014 0814   LDLCALC 141 (H) 04/04/2013 1630      Clinical Atherosclerotic Cardiovascular Disease (ASCVD): No   The ASCVD Risk score (Arnett DK, et al., 2019) failed to calculate for the following reasons:   The valid total cholesterol range is 130 to 320 mg/dL    A/P:  Diabetes t2dm currently uncontrolled, but improved to 7.8%. Patient is  adherent with medication.  He was wearing libre 3 CGM (ran out)  -Continue ozempic 1mg  sq weekly (may increase to 2 mg as tolerated) RX already called in by PCP; Complete at least 4 weeks of Ozempic 1mg  weekly first;  Denies personal and family history of Medullary thyroid cancer (MTC)  Patient tolerating well  -START Farxiga 5mg  daily; GFR 55  Samples given  Counseled patient to stay hydrated  Attempting to get patient to goal a1c of ~7% for surgery that is needed   -Extensively discussed pathophysiology of diabetes, recommended lifestyle interventions, dietary effects on blood sugar control  -Counseled on s/sx of and management of hypoglycemia  -Next A1C anticipated 1 month.   Written patient instructions provided.  Total time in face to face counseling 30 minutes.   Follow up Pharmacist Clinic Visit on 01/06/22.   Regina Eck, PharmD, BCPS Clinical Pharmacist, [place]  [facility] Phone [phone]

## 2022-01-06 ENCOUNTER — Ambulatory Visit (INDEPENDENT_AMBULATORY_CARE_PROVIDER_SITE_OTHER): Payer: No Typology Code available for payment source | Admitting: Pharmacist

## 2022-01-06 ENCOUNTER — Other Ambulatory Visit: Payer: Self-pay

## 2022-01-06 ENCOUNTER — Other Ambulatory Visit (HOSPITAL_COMMUNITY): Payer: Self-pay

## 2022-01-06 ENCOUNTER — Ambulatory Visit: Payer: No Typology Code available for payment source

## 2022-01-06 DIAGNOSIS — N183 Chronic kidney disease, stage 3 unspecified: Secondary | ICD-10-CM

## 2022-01-06 DIAGNOSIS — E1122 Type 2 diabetes mellitus with diabetic chronic kidney disease: Secondary | ICD-10-CM

## 2022-01-06 DIAGNOSIS — E1129 Type 2 diabetes mellitus with other diabetic kidney complication: Secondary | ICD-10-CM | POA: Diagnosis not present

## 2022-01-06 DIAGNOSIS — R809 Proteinuria, unspecified: Secondary | ICD-10-CM

## 2022-01-06 LAB — BAYER DCA HB A1C WAIVED: HB A1C (BAYER DCA - WAIVED): 6.9 % — ABNORMAL HIGH (ref 4.8–5.6)

## 2022-01-06 NOTE — Progress Notes (Signed)
° ° °  01/06/2022 Name: Francisco Allen MRN: [id number] DOB: [birth year]   S:  35 yom Presents for diabetes follow up evaluation, education, and management.  Patient reports compliance with ozempic and farxiga.  He enjoys wearing libre 3 CGM and his sugars continue to improve.  He is here today for A1c recheck to be able to schedule surgery goal a1c<7).  Insurance coverage/medication affordability: cone  Patient reports adherence with medications. Current diabetes medications include: ozempic 1mg , farxiga 5mg    Patient denies hypoglycemic events.   Patient reported dietary habits: Eats 3 meals/day Discussed meal planning options and Plate method for healthy eating Avoid sugary drinks and desserts Incorporate balanced protein, non starchy veggies, 1 serving of carbohydrate with each meal Increase water intake Increase physical activity as able    O:  Lab Results  Component Value Date   HGBA1C 6.9 (H) 01/06/2022    Lipid Panel     Component Value Date/Time   CHOL 103 07/31/2021 1120   CHOL 244 (H) 04/04/2013 1630   TRIG 176 (H) 07/31/2021 1120   TRIG 173 (H) 10/20/2014 0819   TRIG 370 (H) 04/04/2013 1630   HDL 27 (L) 07/31/2021 1120   HDL 28 (L) 10/20/2014 0819   HDL 29 (L) 04/04/2013 1630   CHOLHDL 3.8 07/31/2021 1120   CHOLHDL 9.4 09/23/2017 1636   VLDL 73 (H) 09/23/2017 1636   LDLCALC 47 07/31/2021 1120   LDLCALC 59 04/12/2014 0814   LDLCALC 141 (H) 04/04/2013 1630     Home fasting blood sugars: <130  2 hour post-meal/random blood sugars: <180.    Clinical Atherosclerotic Cardiovascular Disease (ASCVD): No   The ASCVD Risk score (Arnett DK, et al., 2019) failed to calculate for the following reasons:   The valid total cholesterol range is 130 to 320 mg/dL    A/P:  Diabetes t2dm currently uncontrolled, but improved to 6.9% today. Patient is adherent with medication.  He is wearing libre 3 CGM.   -Continue ozempic 1mg  sq weekly              Denies personal  and family history of Medullary thyroid cancer (MTC)             Patient tolerating well   -Continue Farxiga 5mg  daily; GFR 55             Samples given             Counseled patient to stay hydrated             Attempting to get patient to goal a1c of ~7% for surgery that is needed    -Extensively discussed pathophysiology of diabetes, recommended lifestyle interventions, dietary effects on blood sugar control  -Counseled on s/sx of and management of hypoglycemia    Written patient instructions provided.  Total time in face to face counseling 25 minutes.   Regina Eck, PharmD, BCPS Clinical Pharmacist, [place]  [facility] Phone [phone]

## 2022-01-26 NOTE — Patient Instructions (Signed)
? ? ? ? ? ? ? ? Francisco Allen ? 01/26/2022  ?  ? '@PREFPERIOPPHARMACY'$ @ ? ? Your procedure is scheduled on  02/02/2022. ? ? Report to [facility] at  0600  A.M. ? ? Call this number if you have problems the morning of surgery: ? [phone] ? ? Remember: ? Do not eat after midnight. ? ?Follow the diet and prep instructions given to you by the office. ? ? There is a copy enclosed at the end of these instructions. ? ?Drink 2 carb drinks at 10 pm. ? ? ? You may drink clear liquids until  0330 AM on 02/02/2022. ? ?  Clear liquids allowed are:                    Water, Juice (non-citric and without pulp - diabetics please choose diet or no sugar options), Carbonated beverages - (diabetics please choose diet or no sugar options), Clear Tea, Black Coffee only (no creamer, milk or cream including half and half), Plain Jell-O only (diabetics please choose diet or no sugar options), Gatorade (diabetics please choose diet or no sugar options), and Plain Popsicles only ? ? ?DO NOT take any medications for diabetes the morning of your procedure. ? ?  ? Take these medicines the morning of surgery with A SIP OF WATER  ? ?xanax(if needed), carvedilol, flomax, ultram (if needed) ?  ? Do not wear jewelry, make-up or nail polish. ? Do not wear lotions, powders, or perfumes, or deodorant. ? Do not shave 48 hours prior to surgery.  Men may shave face and neck. ? Do not bring valuables to the hospital. ? [facility] is not responsible for any belongings or valuables. ? ?Contacts, dentures or bridgework may not be worn into surgery.  Leave your suitcase in the car.  After surgery it may be brought to your room. ? ?For patients admitted to the hospital, discharge time will be determined by your treatment team. ? ?Patients discharged the day of surgery will not be allowed to drive home and must have someone with them for 24 hours.  ? ? ?Special instructions:   DO NOT smoke tobacco or vape for 24 hours before your procedure. ? ?Please read  over the following fact sheets that you were given. ?Coughing and Deep Breathing, Blood Transfusion Information, Surgical Site Infection Prevention, Anesthesia Post-op Instructions, and Care and Recovery After Surgery ?  ? ? ? Colostomy Reversal Surgery, Care After ?This sheet gives you information about how to care for yourself after your procedure. Your health care provider may also give you more specific instructions. If you have problems or questions, contact your health care provider. ?What can I expect after the procedure? ?After the procedure, it is common to have: ?Pain and discomfort in your abdomen, especially near your incision. ?Loose stools (diarrhea). ?Decreased appetite. ?Constipation. ?Follow these instructions at home: ?Activity ? ?Do not lift anything that is heavier than 10 lb (4.5 kg), or the limit that you are told, until your health care provider says that it is safe. ?Return to your normal activities as told by your health care provider. Ask your health care provider what activities are safe for you. ?Avoid sitting for a long time without moving. Get up to take short walks every 1-2 hours. This is important to improve blood flow and breathing. Ask for help if you feel weak or unsteady. ?Avoid strenuous activity, contact sports, and abdominal exercises for 4 weeks or as long as  told by your health care provider. ?Incision care ? ?Follow instructions from your health care provider about how to take care of your incision. Make sure you: ?Wash your hands with soap and water before and after you change your bandage (dressing). If soap and water are not available, use hand sanitizer. ?Change your dressing as told by your health care provider. ?Leave stitches (sutures), skin glue, or adhesive strips in place. These skin closures may need to stay in place for 2 weeks or longer. If adhesive strip edges start to loosen and curl up, you may trim the loose edges. Do not remove adhesive strips completely  unless your health care provider tells you to do that. ?Keep the incision area clean and dry. ?Check your incision area every day for signs of infection. Check for: ?More redness, swelling, or pain. ?More fluid or blood. ?Warmth. ?Pus or a bad smell. ?Bathing ?Do not take baths, swim, or use a hot tub until your health care provider approves. Ask your health care provider if you may take showers. You may only be allowed to take sponge baths. ?If your health care provider approves bathing and showering, cover the dressing with a watertight covering to protect it from water. Do not let the dressing get wet. ?Keep the dressing dry until your health care provider says it can be removed. ?Driving ?Do not drive for 24 hours if you were given a sedative during your procedure. Follow other driving restrictions as told by your health care provider. ?Do not drive or use heavy machinery while taking prescription pain medicine. ?Eating and drinking ?Follow instructions from your health care provider about eating or drinking restrictions. This may include: ?What to eat and drink. You may be told to start eating a bland diet. Over time, you may slowly resume a more normal, healthy diet. ?How much to eat and drink. You should eat small meals often and stop eating when you feel full. ?Take nutrition supplements as told by your health care provider or dietitian. ?General instructions ?Take over-the-counter and prescription medicines only as told by your health care provider. ?Take steps to treat diarrhea or constipation as told by your health care provider. Your health care provider may recommend that you: ?Drink enough fluid to keep your urine pale yellow. Avoid fluids that contain a lot of sugar or caffeine, such as energy drinks, sports drinks, and soda. ?Eat bland, easy-to-digest foods in small amounts as you are able. These foods include bananas, applesauce, rice, lean meats, toast, and crackers. ?Take over-the-counter or  prescription medicines. ?Limit foods that are high in fat and processed sugars, such as fried or sweet foods. ?Do not use any products that contain nicotine or tobacco, such as cigarettes, e-cigarettes, and chewing tobacco. These can delay incision healing after surgery. If you need help quitting, ask your health care provider. ?Keep all follow-up visits as told by your health care provider. This is important. ?Contact a health care provider if: ?You have more redness, swelling, or pain at the site of your incision. ?You have more fluid or blood coming from your incision. ?Your incision feels warm to the touch. ?You have pus or a bad smell coming from your incision. ?You have a fever. ?Your incision breaks open. ?You feel nauseous. ?You are not able to have a bowel movement (are constipated). ?Your diarrhea gets worse. ?You have pain that is not controlled with medicine. ?Get help right away if you have: ?Abdominal pain that does not go away or becomes severe. ?  Frequent vomiting and you are not able to eat or drink. ?Difficulty breathing. ?Summary ?After colostomy reversal surgery, it is common to have abdominal pain, decreased appetite, diarrhea, or constipation. ?Follow instructions from your health care provider about how to take care of your incision. Do not let the dressing get wet. ?Take over-the-counter and prescription medicines only as told by your health care provider. ?Contact your health care provider if you are not able to have a bowel movement (are constipated). ?Keep all follow-up visits as told by your health care provider. This is important. ?This information is not intended to replace advice given to you by your health care provider. Make sure you discuss any questions you have with your health care provider. ?Document Revised: 04/13/2018 Document Reviewed: 04/13/2018 ?Elsevier Patient Education ? [employer] M[employer]. ?General Anesthesia, Adult, Care After ?This sheet gives you information about how  to care for yourself after your procedure. Your health care provider may also give you more specific instructions. If you have problems or questions, contact your health care provider. ?What can I expect after

## 2022-01-27 ENCOUNTER — Ambulatory Visit (INDEPENDENT_AMBULATORY_CARE_PROVIDER_SITE_OTHER): Payer: No Typology Code available for payment source | Admitting: General Surgery

## 2022-01-27 ENCOUNTER — Other Ambulatory Visit: Payer: Self-pay

## 2022-01-27 ENCOUNTER — Encounter: Payer: Self-pay | Admitting: General Surgery

## 2022-01-27 VITALS — BP 123/83 | HR 72 | Temp 97.2°F | Resp 14 | Ht 69.0 in | Wt 239.0 lb

## 2022-01-27 DIAGNOSIS — Z933 Colostomy status: Secondary | ICD-10-CM | POA: Diagnosis not present

## 2022-01-27 NOTE — Progress Notes (Signed)
[facility] History and Physical  Reason for Referral: Colostomy in place  Referring Physician: Dettinger, Elige Radon, MD   Chief Complaint   Follow-up     Francisco Allen is a 61 y.o. male.  HPI: Francisco Allen is well known to me s/p colostomy for diverticulitis with stenosis. Subsequent colonoscopy was unremarkable and in the mean time he has been seen by nephrology and urology for worsening renal function, and was diagnosed as a diabetic just before plans for his colostomy reversal. He has since been started on medications and his Hgb A1c is down to acceptable ranges. He is ready to proceed with surgery. He is able to care for his bag but is having some bulging around the bag.     Past Medical History:  Diagnosis Date   Anxiety    Arthritis    "all over my body I think" (09/23/2017)   Chronic kidney disease    Colon polyp    Diverticulitis    GERD (gastroesophageal reflux disease)    hx   Hyperlipidemia    Large bowel obstruction (HCC) 01/31/2021   Pre-diabetes     Past Surgical History:  Procedure Laterality Date   ARTHROSCOPY KNEE W/ DRILLING Left 10/25/2020   COLONOSCOPY  X ~ 2   "brother passed away w/colon cancer; I've probably had 4 colonoscopies done since he passed" (09/23/2017)   COLONOSCOPY W/ BIOPSIES AND POLYPECTOMY  X 2   COLONOSCOPY WITH PROPOFOL N/A 04/29/2021   Procedure: COLONOSCOPY WITH PROPOFOL;  Surgeon: Dolores Frame, MD;  Location: [facility];  Service: Gastroenterology;  Laterality: N/A;  8:45   COLOSTOMY N/A 01/31/2021   Procedure: COLOSTOMY;  Surgeon: Lucretia Roers, MD;  Location: [facility];  Service: General;  Laterality: N/A;   CYST EXCISION  2004   Roof of mouth   HEMORRHOID SURGERY     "lanced"   LEFT HEART CATH AND CORONARY ANGIOGRAPHY N/A 09/24/2017   Procedure: LEFT HEART CATH AND CORONARY ANGIOGRAPHY;  Surgeon: Corky Crafts, MD;  Location: [facility] INVASIVE CV LAB;  Service: Cardiovascular;  Laterality:  N/A;   PARTIAL COLECTOMY N/A 01/31/2021   Procedure: PARTIAL COLECTOMY;  Surgeon: Lucretia Roers, MD;  Location: [facility];  Service: General;  Laterality: N/A;   POLYPECTOMY  04/29/2021   Procedure: POLYPECTOMY;  Surgeon: Marguerita Merles, Reuel Boom, MD;  Location: [facility];  Service: Gastroenterology;;   SHOULDER ARTHROSCOPY WITH ROTATOR CUFF REPAIR Right     Family History  Problem Relation Age of Onset   Colon cancer Brother    Dementia Mother    CVA Father    Sudden death Sister 63       in her sleep, was told a heart attack   Heart disease Brother        CABG   Heart disease Brother        CABG   Heart disease Brother        CABG    Social History   Tobacco Use   Smoking status: Every Day    Packs/day: 1.00    Years: 34.00    Pack years: 34.00    Types: Cigarettes   Smokeless tobacco: Never  Vaping Use   Vaping Use: Never used  Substance Use Topics   Alcohol use: No   Drug use: No    Medications: I have reviewed the patient's current medications. Allergies as of 01/27/2022       Reactions   Bee Venom Anaphylaxis, Swelling, Other (  See Comments)   Anaphylactic shock   Penicillins Anaphylaxis, Swelling   Airway closes up Has patient had a PCN reaction causing immediate rash, facial/tongue/throat swelling, SOB or lightheadedness with hypotension: Yes Has patient had a PCN reaction causing severe rash involving mucus membranes or skin necrosis: Yes Has patient had a PCN reaction that required hospitalization: Francisco Allen Has patient had a PCN reaction occurring within the last 10 years: No If all of the above answers are "NO", then may proceed with Cephalosporin use.        Medication List        Accurate as of January 27, 2022 10:07 AM. If you have any questions, ask your nurse or doctor.          ALPRAZolam 1 MG tablet Commonly known as: XANAX Take 1 tablet (1 mg total) by mouth 3 (three) times daily as needed for anxiety.   carvedilol 6.25 MG  tablet Commonly known as: COREG Take 1 tablet (6.25 mg total) by mouth 2 (two) times daily with a meal in the morning and evening   dapagliflozin propanediol 5 MG Tabs tablet Commonly known as: FARXIGA Take 1 tablet (5 mg total) by mouth daily before breakfast.   Fish Oil 1000 MG Caps Take 1,000 mg by mouth daily.   lisinopril 5 MG tablet Commonly known as: ZESTRIL Take 1 tablet (5 mg total) by mouth daily.   MENS 50+ MULTI VITAMIN/MIN PO Take 1 tablet by mouth daily.   metroNIDAZOLE 500 MG tablet Commonly known as: Flagyl Take 2 tablets (1,000 mg total) by mouth as directed. Take 2 flagyl 500mg  tablets at 2 pm, 3pm, and 10 pm the day before surgery.   neomycin 500 MG tablet Commonly known as: MYCIFRADIN Take 2 tablets (1,000 mg total) by mouth as directed. Take 2 neomycin 500mg  tablets at 2 pm, 3pm, and 10 pm the day before surgery.   rosuvastatin 40 MG tablet Commonly known as: CRESTOR Take 1 tablet (40 mg total) by mouth daily.   Semaglutide (1 MG/DOSE) 4 MG/3ML Sopn Inject 1 mg as directed once a week. What changed: additional instructions   tamsulosin 0.4 MG Caps capsule Commonly known as: FLOMAX Take 1 capsule (0.4 mg total) by mouth 2 (two) times daily. What changed: Another medication with the same name was removed. Continue taking this medication, and follow the directions you see here. Changed by: Lucretia Roers, MD   traMADol 50 MG tablet Commonly known as: ULTRAM Take 50 mg by mouth 2 (two) times daily as needed for moderate pain.         ROS:  A comprehensive review of systems was negative except for: Gastrointestinal: positive for abdominal pain and colostomy in place, some parastomal hernia  Blood pressure 123/83, pulse 72, temperature (!) 97.2 F (36.2 C), temperature source Other (Comment), resp. rate 14, height 5\' 9"  (1.753 m), weight 239 lb (108.4 kg), SpO2 95 %. Physical Exam Vitals reviewed.  Constitutional:      Appearance: He is  obese.  HENT:     Head: Normocephalic.     Nose: Nose normal.     Mouth/Throat:     Mouth: Mucous membranes are moist.  Eyes:     Extraocular Movements: Extraocular movements intact.  Cardiovascular:     Rate and Rhythm: Normal rate and regular rhythm.  Pulmonary:     Effort: Pulmonary effort is normal.     Breath sounds: Normal breath sounds.  Abdominal:     General: There is no  distension.     Palpations: Abdomen is soft.     Tenderness: There is no abdominal tenderness.     Hernia: A hernia is present.     Comments: Colostomy pink with stool, parastomal hernia   Musculoskeletal:        General: Normal range of motion.     Cervical back: Normal range of motion.  Skin:    General: Skin is warm.  Neurological:     General: No focal deficit present.     Mental Status: He is alert.  Psychiatric:        Mood and Affect: Mood normal.    Results: None   Assessment & Plan:  HASHIM SERMERSHEIM is a 62 y.o. male with an end colostomy in place for stricture/ stenosis after resection. Doing well and colonoscopy good. His diabetes is controlled.  Discussed risk of surgery and risk of bleeding, infection, anastomotic leak, ureter injury, and importance of blood sugar control. He takes Ozempic and initially we had been told by Anesthesia he needed to hold this but it has been determined it can take it on 3/22 prior to surgery 3/27.  Discussed potential need for blood and allergic reactions/ infections associated with that administration.   -Buy from the Store: Miralax bottle (288g).  Gatorade 64 oz (not red). Dulcolax tablets.   The Day Prior to Surgery: Take 4 ducolax tablets at 7am with water. Drink plenty of clear liquids all day to avoid dehydration, no solid food.    Mix the bottle of Miralax and 64 oz of Gatorade and drink this mixture starting at 10am. Drink it gradually over the next few hours, 8 ounces every 15-30 minutes until it is gone. Finish this by 2pm.  Take 2 neomycin  500mg  tablets and 2 metronidazole 500mg  tablets at 2 pm. Take 2 neomycin 500mg  tablets and 2 metronidazole 500mg  tablets at 3pm. Take 2 neomycin 500mg  tablets and 2 metronidazole 500mg  tablets at 10pm.    Do not eat or drink anything after midnight the night before your surgery.  Do not eat or drink anything that morning, and take medications as instructed by the hospital staff on your preoperative visit.     All questions were answered to the satisfaction of the patient.    Lucretia Roers 01/27/2022, 10:07 AM

## 2022-01-27 NOTE — Patient Instructions (Addendum)
DO NOT TAKE THE OZEMPIC ?I have left Dr. Warrick Parisian and Almyra Free know about the [place].  ?Almyra Free has you some Januvia samples for you to take.  Go get them. ? ? ?Bowel Preparation:  ?Buy from the Store: ?Miralax bottle (288g).  ?Gatorade 64 oz (not red). ?Dulcolax tablets.  ? ?The Day Prior to Surgery: ?Take 4 ducolax tablets at 7am with water. ?Do an enema through your rectum at [place]. ?Drink plenty of clear liquids all day to avoid dehydration, no solid food.  ?  ?Mix the bottle of Miralax and 64 oz of Gatorade and drink this mixture starting at 10am.  Drink it gradually over the next few hours, 8 ounces every 15-30 minutes until it is gone. Finish this by 2pm.  ?Repeat an enema at 2pm.  ?Take 2 neomycin '500mg'$  tablets and 2 metronidazole '500mg'$  tablets at 2 pm. ?Take 2 neomycin '500mg'$  tablets and 2 metronidazole '500mg'$  tablets at 3pm. ?Take 2 neomycin '500mg'$  tablets and 2 metronidazole '500mg'$  tablets at 10pm.  ?  ?Do not eat or drink anything after midnight the night before your surgery.  ?Do not eat or drink anything that morning, and take medications as instructed by the hospital staff on your preoperative visit.   ?

## 2022-01-28 ENCOUNTER — Telehealth: Payer: Self-pay | Admitting: *Deleted

## 2022-01-28 NOTE — H&P (Signed)
[facility] History and Physical ? ?Reason for Referral: Colostomy in place  ?Referring Physician: Dettinger, Fransisca Kaufmann, MD ? ? ?Chief Complaint   ?Follow-up ?  ? ? ?Francisco Allen is a 61 y.o. male.  ?HPI: [name] is well known to me s/p colostomy for diverticulitis with stenosis. Subsequent colonoscopy was unremarkable and in the mean time he has been seen by nephrology and urology for worsening renal function, and was diagnosed as a diabetic just before plans for his colostomy reversal. He has since been started on medications and his Hgb A1c is down to acceptable ranges. He is ready to proceed with surgery. He is able to care for his bag but is having some bulging around the bag.   ? ? ?Past Medical History:  ?Diagnosis Date  ? Anxiety   ? Arthritis   ? "all over my body I think" (09/23/2017)  ? Chronic kidney disease   ? Colon polyp   ? Diverticulitis   ? GERD (gastroesophageal reflux disease)   ? hx  ? Hyperlipidemia   ? Large bowel obstruction ([place]) 01/31/2021  ? Pre-diabetes   ? ? ?Past Surgical History:  ?Procedure Laterality Date  ? ARTHROSCOPY KNEE W/ DRILLING Left 10/25/2020  ? COLONOSCOPY  X ~ 2  ? "brother passed away w/colon cancer; I've probably had 4 colonoscopies done since he passed" (09/23/2017)  ? COLONOSCOPY W/ BIOPSIES AND POLYPECTOMY  X 2  ? COLONOSCOPY WITH PROPOFOL N/A 04/29/2021  ? Procedure: COLONOSCOPY WITH PROPOFOL;  Surgeon: Harvel Quale, MD;  [facility];  Service: Gastroenterology;  Laterality: N/A;  8:45  ? COLOSTOMY N/A 01/31/2021  ? Procedure: COLOSTOMY;  Surgeon: Francisco Cagey, MD;  Location: [facility];  Service: General;  Laterality: N/A;  ? CYST EXCISION  2004  ? Roof of mouth  ? HEMORRHOID SURGERY    ? "lanced"  ? LEFT HEART CATH AND CORONARY ANGIOGRAPHY N/A 09/24/2017  ? Procedure: LEFT HEART CATH AND CORONARY ANGIOGRAPHY;  Surgeon: Francisco Booze, MD;  Location: [facility];  Service: Cardiovascular;  Laterality:  N/A;  ? PARTIAL COLECTOMY N/A 01/31/2021  ? Procedure: PARTIAL COLECTOMY;  Surgeon: Francisco Cagey, MD;  Location: [facility];  Service: General;  Laterality: N/A;  ? POLYPECTOMY  04/29/2021  ? Procedure: POLYPECTOMY;  Surgeon: Montez Allen, Francisco Quince, MD;  Location: [facility];  Service: Gastroenterology;;  ? SHOULDER ARTHROSCOPY WITH ROTATOR CUFF REPAIR Right   ? ? ?Family History  ?Problem Relation Age of Onset  ? Colon cancer Brother   ? Dementia Mother   ? CVA Father   ? Sudden death Sister 46  ?     in her sleep, was told a heart attack  ? Heart disease Brother   ?     CABG  ? Heart disease Brother   ?     CABG  ? Heart disease Brother   ?     CABG  ? ? ?Social History  ? ?Tobacco Use  ? Smoking status: Every Day  ?  Packs/day: 1.00  ?  Years: 34.00  ?  Pack years: 34.00  ?  Types: Cigarettes  ? Smokeless tobacco: Never  ?Vaping Use  ? Vaping Use: Never used  ?Substance Use Topics  ? Alcohol use: No  ? Drug use: No  ? ? ?Medications: I have reviewed the patient's current medications. ?Allergies as of 01/27/2022   ? ?   Reactions  ? Bee Venom Anaphylaxis, Swelling, Other (  See Comments)  ? Anaphylactic shock  ? Penicillins Anaphylaxis, Swelling  ? Airway closes up ?Has patient had a PCN reaction causing immediate rash, facial/tongue/throat swelling, SOB or lightheadedness with hypotension: Yes ?Has patient had a PCN reaction causing severe rash involving mucus membranes or skin necrosis: Yes ?Has patient had a PCN reaction that required hospitalization: Francisco Allen ?Has patient had a PCN reaction occurring within the last 10 years: No ?If all of the above answers are "NO", then may proceed with Cephalosporin use.  ? ?  ? ?  ?Medication List  ?  ? ?  ? Accurate as of January 27, 2022 10:07 AM. If you have any questions, ask your nurse or doctor.  ?  ?  ? ?  ? ?ALPRAZolam 1 MG tablet ?Commonly known as: Francisco Allen ?Take 1 tablet (1 mg total) by mouth 3 (three) times daily as needed for anxiety. ?  ?carvedilol 6.25 MG  tablet ?Commonly known as: COREG ?Take 1 tablet (6.25 mg total) by mouth 2 (two) times daily with a meal in the morning and evening ?  ?dapagliflozin propanediol 5 MG Tabs tablet ?Commonly known as: FARXIGA ?Take 1 tablet (5 mg total) by mouth daily before breakfast. ?  ?Fish Oil 1000 MG Caps ?Take 1,000 mg by mouth daily. ?  ?lisinopril 5 MG tablet ?Commonly known as: ZESTRIL ?Take 1 tablet (5 mg total) by mouth daily. ?  ?MENS 50+ MULTI VITAMIN/MIN PO ?Take 1 tablet by mouth daily. ?  ?metroNIDAZOLE 500 MG tablet ?Commonly known as: Flagyl ?Take 2 tablets (1,000 mg total) by mouth as directed. Take 2 flagyl '500mg'$  tablets at 2 pm, 3pm, and 10 pm the day before surgery. ?  ?neomycin 500 MG tablet ?Commonly known as: MYCIFRADIN ?Take 2 tablets (1,000 mg total) by mouth as directed. Take 2 neomycin '500mg'$  tablets at 2 pm, 3pm, and 10 pm the day before surgery. ?  ?rosuvastatin 40 MG tablet ?Commonly known as: CRESTOR ?Take 1 tablet (40 mg total) by mouth daily. ?  ?Semaglutide (1 MG/DOSE) 4 MG/3ML Sopn ?Inject 1 mg as directed once a week. ?What changed: additional instructions ?  ?tamsulosin 0.4 MG Caps capsule ?Commonly known as: FLOMAX ?Take 1 capsule (0.4 mg total) by mouth 2 (two) times daily. ?What changed: Another medication with the same name was removed. Continue taking this medication, and follow the directions you see here. ?Changed by: Francisco Cagey, MD ?  ?traMADol 50 MG tablet ?Commonly known as: ULTRAM ?Take 50 mg by mouth 2 (two) times daily as needed for moderate pain. ?  ? ?  ? ? ? ?ROS:  ?A comprehensive review of systems was negative except for: Gastrointestinal: positive for abdominal pain and colostomy in place, some parastomal hernia ? ?Blood pressure 123/83, pulse 72, temperature (!) 97.2 ?F (36.2 ?C), temperature source Other (Comment), resp. rate 14, height '5\' 9"'$  (1.753 m), weight 239 lb (108.4 kg), SpO2 95 %. ?Physical Exam ?Vitals reviewed.  ?Constitutional:   ?   Appearance: He is  obese.  ?HENT:  ?   Head: Normocephalic.  ?   Nose: Nose normal.  ?   Mouth/Throat:  ?   Mouth: Mucous membranes are moist.  ?Eyes:  ?   Extraocular Movements: Extraocular movements intact.  ?Cardiovascular:  ?   Rate and Rhythm: Normal rate and regular rhythm.  ?Pulmonary:  ?   Effort: Pulmonary effort is normal.  ?   Breath sounds: Normal breath sounds.  ?Abdominal:  ?   General: There is no  distension.  ?   Palpations: Abdomen is soft.  ?   Tenderness: There is no abdominal tenderness.  ?   Hernia: A hernia is present.  ?   Comments: Colostomy pink with stool, parastomal hernia   ?Musculoskeletal:     ?   General: Normal range of motion.  ?   Cervical back: Normal range of motion.  ?Skin: ?   General: Skin is warm.  ?Neurological:  ?   General: No focal deficit present.  ?   Mental Status: He is alert.  ?Psychiatric:     ?   Mood and Affect: Mood normal.  ? ? ?Results: ?None  ? ?Assessment & Plan:  ?IZIAH CATES is a 61 y.o. male with an end colostomy in place for stricture/ stenosis after resection. Doing well and colonoscopy good. His diabetes is controlled.  Discussed risk of surgery and risk of bleeding, infection, anastomotic leak, ureter injury, and importance of blood sugar control. He takes Ozempic and initially we had been told by Anesthesia he needed to hold this but it has been determined it can take it on 3/22 prior to surgery 3/27.  Discussed potential need for blood and allergic reactions/ infections associated with that administration.  ? ?-Buy from the Store: ?Miralax bottle (288g).  ?Gatorade 64 oz (not red). ?Dulcolax tablets.  ? ?The Day Prior to Surgery: ?Take 4 ducolax tablets at 7am with water. ?Drink plenty of clear liquids all day to avoid dehydration, no solid food.  ?  ?Mix the bottle of Miralax and 64 oz of Gatorade and drink this mixture starting at 10am. Drink it gradually over the next few hours, 8 ounces every 15-30 minutes until it is gone. Finish this by 2pm.  ?Take 2 neomycin  '500mg'$  tablets and 2 metronidazole '500mg'$  tablets at 2 pm. ?Take 2 neomycin '500mg'$  tablets and 2 metronidazole '500mg'$  tablets at 3pm. ?Take 2 neomycin '500mg'$  tablets and 2 metronidazole '500mg'$  tablets at 10pm.  ?  ?Do not ea

## 2022-01-28 NOTE — Telephone Encounter (Signed)
Received orders from Dr Constance Haw to notify patient that he can continue Ozempic. Patient does not need to start Januvia.  ? ?Call placed to patient and message left on VM with directions. MyChart message also sent to patient.  ?

## 2022-01-29 ENCOUNTER — Encounter (HOSPITAL_COMMUNITY): Payer: Self-pay

## 2022-01-29 ENCOUNTER — Other Ambulatory Visit (HOSPITAL_COMMUNITY)
Admission: RE | Admit: 2022-01-29 | Discharge: 2022-01-29 | Disposition: A | Discharge status: Home / Self Care | Payer: No Typology Code available for payment source | Source: Ambulatory Visit | Attending: General Surgery | Admitting: General Surgery

## 2022-01-29 ENCOUNTER — Encounter (HOSPITAL_COMMUNITY)
Admission: RE | Admit: 2022-01-29 | Discharge: 2022-01-29 | Disposition: A | Discharge status: Home / Self Care | Payer: No Typology Code available for payment source | Source: Ambulatory Visit | Attending: General Surgery | Admitting: General Surgery

## 2022-01-29 VITALS — BP 131/82 | HR 82 | Temp 97.2°F | Resp 18 | Ht 69.0 in | Wt 239.0 lb

## 2022-01-29 DIAGNOSIS — E119 Type 2 diabetes mellitus without complications: Secondary | ICD-10-CM | POA: Diagnosis not present

## 2022-01-29 DIAGNOSIS — Z933 Colostomy status: Secondary | ICD-10-CM | POA: Diagnosis not present

## 2022-01-29 DIAGNOSIS — Z01812 Encounter for preprocedural laboratory examination: Secondary | ICD-10-CM | POA: Diagnosis present

## 2022-01-29 DIAGNOSIS — R809 Proteinuria, unspecified: Secondary | ICD-10-CM | POA: Diagnosis not present

## 2022-01-29 DIAGNOSIS — R7303 Prediabetes: Secondary | ICD-10-CM

## 2022-01-29 DIAGNOSIS — Z9889 Other specified postprocedural states: Secondary | ICD-10-CM | POA: Insufficient documentation

## 2022-01-29 DIAGNOSIS — E1129 Type 2 diabetes mellitus with other diabetic kidney complication: Secondary | ICD-10-CM

## 2022-01-29 HISTORY — DX: Essential (primary) hypertension: I10

## 2022-01-29 LAB — CBC WITH DIFFERENTIAL/PLATELET
Abs Immature Granulocytes: 0.07 10*3/uL (ref 0.00–0.07)
Basophils Absolute: 0.1 10*3/uL (ref 0.0–0.1)
Basophils Relative: 1 %
Eosinophils Absolute: 0.2 10*3/uL (ref 0.0–0.5)
Eosinophils Relative: 2 %
HCT: 48.5 % (ref 39.0–52.0)
Hemoglobin: 16 g/dL (ref 13.0–17.0)
Immature Granulocytes: 1 %
Lymphocytes Relative: 22 %
Lymphs Abs: 1.8 10*3/uL (ref 0.7–4.0)
MCH: 29.3 pg (ref 26.0–34.0)
MCHC: 33 g/dL (ref 30.0–36.0)
MCV: 88.7 fL (ref 80.0–100.0)
Monocytes Absolute: 0.7 10*3/uL (ref 0.1–1.0)
Monocytes Relative: 8 %
Neutro Abs: 5.5 10*3/uL (ref 1.7–7.7)
Neutrophils Relative %: 66 %
Platelets: 151 10*3/uL (ref 150–400)
RBC: 5.47 MIL/uL (ref 4.22–5.81)
RDW: 15 % (ref 11.5–15.5)
WBC: 8.2 10*3/uL (ref 4.0–10.5)
nRBC: 0 % (ref 0.0–0.2)

## 2022-01-29 LAB — BASIC METABOLIC PANEL
Anion gap: 8 (ref 5–15)
BUN: 22 mg/dL (ref 8–23)
CO2: 23 mmol/L (ref 22–32)
Calcium: 9.1 mg/dL (ref 8.9–10.3)
Chloride: 105 mmol/L (ref 98–111)
Creatinine, Ser: 1.13 mg/dL (ref 0.61–1.24)
GFR, Estimated: >60 mL/min (ref >60)
Glucose, Bld: 133 mg/dL — ABNORMAL HIGH (ref 70–99)
Potassium: 4.4 mmol/L (ref 3.5–5.1)
Sodium: 136 mmol/L (ref 135–145)

## 2022-01-29 LAB — HEMOGLOBIN A1C
Hgb A1c MFr Bld: 6.7 % — ABNORMAL HIGH (ref 4.8–5.6)
Mean Plasma Glucose: 145.59 mg/dL

## 2022-01-29 LAB — PREPARE RBC (CROSSMATCH)

## 2022-01-29 NOTE — Progress Notes (Signed)
?   01/29/22 1405  ?OBSTRUCTIVE SLEEP APNEA  ?Have you ever been diagnosed with sleep apnea through a sleep study? No  ?Do you snore loudly (loud enough to be heard through closed doors)?  1  ?Do you often feel tired, fatigued, or sleepy during the daytime (such as falling asleep during driving or talking to someone)? 0  ?Has anyone observed you stop breathing during your sleep? 0  ?Do you have, or are you being treated for high blood pressure? 1  ?BMI more than 35 kg/m2? 1  ?Age > 80 (1-yes) 1  ?Neck circumference greater than:Male 16 inches or larger, Male 17inches or larger? 0  ?Male Gender (Yes=1) 1  ?Obstructive Sleep Apnea Score 5  ?Score 5 or greater  Results sent to PCP  ? ? ?

## 2022-01-30 MED ORDER — CLINDAMYCIN PHOSPHATE 900 MG/50ML IV SOLN
900.0000 mg | INTRAVENOUS | Status: AC
  Administered 2022-02-02: 900 mg via INTRAVENOUS
  Filled 2022-01-30: qty 50

## 2022-01-30 MED ORDER — GENTAMICIN SULFATE 40 MG/ML IJ SOLN
430.0000 mg | INTRAVENOUS | Status: AC
  Administered 2022-02-02: 430 mg via INTRAVENOUS
  Filled 2022-01-30: qty 10.75

## 2022-02-02 ENCOUNTER — Encounter (HOSPITAL_COMMUNITY): Payer: Self-pay | Admitting: General Surgery

## 2022-02-02 ENCOUNTER — Other Ambulatory Visit: Payer: Self-pay

## 2022-02-02 ENCOUNTER — Inpatient Hospital Stay (HOSPITAL_COMMUNITY): Payer: No Typology Code available for payment source | Admitting: Anesthesiology

## 2022-02-02 ENCOUNTER — Inpatient Hospital Stay (HOSPITAL_COMMUNITY)
Admission: RE | Admit: 2022-02-02 | Discharge: 2022-02-06 | DRG: 330 | Disposition: A | Discharge status: Home / Self Care | Payer: No Typology Code available for payment source | Attending: General Surgery | Admitting: General Surgery

## 2022-02-02 ENCOUNTER — Encounter (HOSPITAL_COMMUNITY)
Admission: RE | Disposition: A | Discharge status: Home / Self Care | Payer: Self-pay | Source: Home / Self Care | Attending: General Surgery

## 2022-02-02 DIAGNOSIS — Z9049 Acquired absence of other specified parts of digestive tract: Secondary | ICD-10-CM

## 2022-02-02 DIAGNOSIS — N183 Chronic kidney disease, stage 3 unspecified: Secondary | ICD-10-CM | POA: Diagnosis present

## 2022-02-02 DIAGNOSIS — Z433 Encounter for attention to colostomy: Principal | ICD-10-CM

## 2022-02-02 DIAGNOSIS — S36439A Laceration of unspecified part of small intestine, initial encounter: Secondary | ICD-10-CM | POA: Diagnosis not present

## 2022-02-02 DIAGNOSIS — K660 Peritoneal adhesions (postprocedural) (postinfection): Secondary | ICD-10-CM

## 2022-02-02 DIAGNOSIS — I129 Hypertensive chronic kidney disease with stage 1 through stage 4 chronic kidney disease, or unspecified chronic kidney disease: Secondary | ICD-10-CM | POA: Diagnosis present

## 2022-02-02 DIAGNOSIS — Z823 Family history of stroke: Secondary | ICD-10-CM | POA: Diagnosis not present

## 2022-02-02 DIAGNOSIS — Y658 Other specified misadventures during surgical and medical care: Secondary | ICD-10-CM | POA: Diagnosis not present

## 2022-02-02 DIAGNOSIS — F17210 Nicotine dependence, cigarettes, uncomplicated: Secondary | ICD-10-CM | POA: Diagnosis present

## 2022-02-02 DIAGNOSIS — E1165 Type 2 diabetes mellitus with hyperglycemia: Secondary | ICD-10-CM | POA: Diagnosis present

## 2022-02-02 DIAGNOSIS — E669 Obesity, unspecified: Secondary | ICD-10-CM | POA: Diagnosis present

## 2022-02-02 DIAGNOSIS — Z7984 Long term (current) use of oral hypoglycemic drugs: Secondary | ICD-10-CM | POA: Diagnosis not present

## 2022-02-02 DIAGNOSIS — D72828 Other elevated white blood cell count: Secondary | ICD-10-CM | POA: Diagnosis not present

## 2022-02-02 DIAGNOSIS — E785 Hyperlipidemia, unspecified: Secondary | ICD-10-CM | POA: Diagnosis present

## 2022-02-02 DIAGNOSIS — Z9889 Other specified postprocedural states: Secondary | ICD-10-CM | POA: Diagnosis present

## 2022-02-02 DIAGNOSIS — K435 Parastomal hernia without obstruction or  gangrene: Secondary | ICD-10-CM

## 2022-02-02 DIAGNOSIS — K439 Ventral hernia without obstruction or gangrene: Secondary | ICD-10-CM | POA: Diagnosis not present

## 2022-02-02 DIAGNOSIS — Z800 Family history of malignant neoplasm of digestive organs: Secondary | ICD-10-CM

## 2022-02-02 DIAGNOSIS — E1129 Type 2 diabetes mellitus with other diabetic kidney complication: Secondary | ICD-10-CM

## 2022-02-02 DIAGNOSIS — E1122 Type 2 diabetes mellitus with diabetic chronic kidney disease: Secondary | ICD-10-CM | POA: Diagnosis present

## 2022-02-02 DIAGNOSIS — F419 Anxiety disorder, unspecified: Secondary | ICD-10-CM | POA: Diagnosis present

## 2022-02-02 DIAGNOSIS — K219 Gastro-esophageal reflux disease without esophagitis: Secondary | ICD-10-CM | POA: Diagnosis present

## 2022-02-02 DIAGNOSIS — Z79899 Other long term (current) drug therapy: Secondary | ICD-10-CM

## 2022-02-02 DIAGNOSIS — Z8249 Family history of ischemic heart disease and other diseases of the circulatory system: Secondary | ICD-10-CM

## 2022-02-02 DIAGNOSIS — Z6838 Body mass index (BMI) 38.0-38.9, adult: Secondary | ICD-10-CM | POA: Diagnosis not present

## 2022-02-02 DIAGNOSIS — K9171 Accidental puncture and laceration of a digestive system organ or structure during a digestive system procedure: Secondary | ICD-10-CM | POA: Diagnosis not present

## 2022-02-02 DIAGNOSIS — R809 Proteinuria, unspecified: Secondary | ICD-10-CM

## 2022-02-02 DIAGNOSIS — Z933 Colostomy status: Principal | ICD-10-CM

## 2022-02-02 HISTORY — PX: COLOSTOMY REVERSAL: SHX5782

## 2022-02-02 HISTORY — PX: PARASTOMAL HERNIA REPAIR: SHX2162

## 2022-02-02 LAB — GLUCOSE, CAPILLARY
Glucose-Capillary: 112 mg/dL — ABNORMAL HIGH (ref 70–99)
Glucose-Capillary: 197 mg/dL — ABNORMAL HIGH (ref 70–99)
Glucose-Capillary: 196 mg/dL — ABNORMAL HIGH (ref 70–99)
Glucose-Capillary: 168 mg/dL — ABNORMAL HIGH (ref 70–99)

## 2022-02-02 SURGERY — COLOSTOMY REVERSAL
Anesthesia: General | Site: Abdomen

## 2022-02-02 MED ORDER — LACTATED RINGERS IV SOLN: INTRAVENOUS | Status: DC

## 2022-02-02 MED ORDER — LIDOCAINE HCL (CARDIAC) PF 100 MG/5ML IV SOSY
PREFILLED_SYRINGE | INTRAVENOUS | Status: DC | PRN
Start: 2022-02-02 — End: 2022-02-02
  Administered 2022-02-02: 60 mg via INTRAVENOUS

## 2022-02-02 MED ORDER — ARTIFICIAL TEARS OPHTHALMIC OINT
TOPICAL_OINTMENT | OPHTHALMIC | Status: AC
  Filled 2022-02-02: qty 3.5

## 2022-02-02 MED ORDER — ALVIMOPAN 12 MG PO CAPS: 12.0000 mg | ORAL_CAPSULE | ORAL | Status: AC

## 2022-02-02 MED ORDER — EPHEDRINE 5 MG/ML INJ
INTRAVENOUS | Status: AC
Start: 2022-02-02 — End: ?
  Filled 2022-02-02: qty 5

## 2022-02-02 MED ORDER — SODIUM CHLORIDE 0.9 % IR SOLN
Status: DC | PRN
  Administered 2022-02-02: 1000 mL
  Administered 2022-02-02: 2000 mL

## 2022-02-02 MED ORDER — PHENYLEPHRINE 40 MCG/ML (10ML) SYRINGE FOR IV PUSH (FOR BLOOD PRESSURE SUPPORT)
PREFILLED_SYRINGE | INTRAVENOUS | Status: AC
  Filled 2022-02-02: qty 20

## 2022-02-02 MED ORDER — ONDANSETRON HCL 4 MG/2ML IJ SOLN
INTRAMUSCULAR | Status: DC | PRN
  Administered 2022-02-02: 4 mg via INTRAVENOUS

## 2022-02-02 MED ORDER — HYDROXYZINE HCL 10 MG PO TABS
10.0000 mg | ORAL_TABLET | Freq: Four times a day (QID) | ORAL | Status: DC | PRN
Start: 2022-02-02 — End: 2022-02-06
  Administered 2022-02-02: 10 mg via ORAL
  Filled 2022-02-02: qty 1

## 2022-02-02 MED ORDER — DEXAMETHASONE SODIUM PHOSPHATE 10 MG/ML IJ SOLN
INTRAMUSCULAR | Status: AC
  Filled 2022-02-02: qty 1

## 2022-02-02 MED ORDER — CHLORHEXIDINE GLUCONATE CLOTH 2 % EX PADS
6.0000 | MEDICATED_PAD | Freq: Once | CUTANEOUS | Status: AC
  Administered 2022-02-02: 6 via TOPICAL

## 2022-02-02 MED ORDER — ACETAMINOPHEN 500 MG PO TABS
ORAL_TABLET | ORAL | Status: AC
  Administered 2022-02-02: 1000 mg via ORAL
  Filled 2022-02-02: qty 2

## 2022-02-02 MED ORDER — HEPARIN SODIUM (PORCINE) 5000 UNIT/ML IJ SOLN
5000.0000 [IU] | Freq: Three times a day (TID) | INTRAMUSCULAR | Status: DC
  Administered 2022-02-03 – 2022-02-05 (×8): 5000 [IU] via SUBCUTANEOUS
  Filled 2022-02-02 (×9): qty 1

## 2022-02-02 MED ORDER — CARVEDILOL 3.125 MG PO TABS
6.2500 mg | ORAL_TABLET | Freq: Two times a day (BID) | ORAL | Status: DC
  Administered 2022-02-02 – 2022-02-06 (×8): 6.25 mg via ORAL
  Filled 2022-02-02 (×8): qty 2

## 2022-02-02 MED ORDER — ACETAMINOPHEN 500 MG PO TABS
1000.0000 mg | ORAL_TABLET | Freq: Four times a day (QID) | ORAL | Status: DC
Start: 2022-02-02 — End: 2022-02-03
  Administered 2022-02-02: 1000 mg via ORAL
  Filled 2022-02-02 (×2): qty 2

## 2022-02-02 MED ORDER — SODIUM CHLORIDE 0.9 % IV SOLN: INTRAVENOUS | Status: DC | PRN

## 2022-02-02 MED ORDER — PROPOFOL 10 MG/ML IV BOLUS
INTRAVENOUS | Status: DC | PRN
  Administered 2022-02-02: 170 mg via INTRAVENOUS

## 2022-02-02 MED ORDER — ONDANSETRON HCL 4 MG/2ML IJ SOLN: 4.0000 mg | Freq: Once | INTRAMUSCULAR | Status: DC | PRN

## 2022-02-02 MED ORDER — BUPIVACAINE LIPOSOME 1.3 % IJ SUSP
INTRAMUSCULAR | Status: DC | PRN
  Administered 2022-02-02: 20 mL

## 2022-02-02 MED ORDER — FENTANYL CITRATE PF 50 MCG/ML IJ SOSY
25.0000 ug | PREFILLED_SYRINGE | INTRAMUSCULAR | Status: DC | PRN
  Administered 2022-02-02: 25 ug via INTRAVENOUS
  Administered 2022-02-02: 50 ug via INTRAVENOUS
  Administered 2022-02-02 (×2): 25 ug via INTRAVENOUS
  Filled 2022-02-02 (×2): qty 1

## 2022-02-02 MED ORDER — ALVIMOPAN 12 MG PO CAPS
12.0000 mg | ORAL_CAPSULE | Freq: Two times a day (BID) | ORAL | Status: DC
  Administered 2022-02-03 – 2022-02-05 (×5): 12 mg via ORAL
  Filled 2022-02-02 (×5): qty 1

## 2022-02-02 MED ORDER — CLINDAMYCIN PHOSPHATE 900 MG/50ML IV SOLN
900.0000 mg | Freq: Three times a day (TID) | INTRAVENOUS | Status: AC
  Administered 2022-02-02: 900 mg via INTRAVENOUS
  Filled 2022-02-02: qty 50

## 2022-02-02 MED ORDER — ACETAMINOPHEN 500 MG PO TABS: 1000.0000 mg | ORAL_TABLET | ORAL | Status: AC

## 2022-02-02 MED ORDER — ROCURONIUM BROMIDE 10 MG/ML (PF) SYRINGE
PREFILLED_SYRINGE | INTRAVENOUS | Status: DC | PRN
  Administered 2022-02-02: 60 mg via INTRAVENOUS
  Administered 2022-02-02 (×4): 10 mg via INTRAVENOUS
  Administered 2022-02-02 (×2): 30 mg via INTRAVENOUS

## 2022-02-02 MED ORDER — SACCHAROMYCES BOULARDII 250 MG PO CAPS
250.0000 mg | ORAL_CAPSULE | Freq: Two times a day (BID) | ORAL | Status: DC
  Administered 2022-02-02 – 2022-02-06 (×8): 250 mg via ORAL
  Filled 2022-02-02 (×8): qty 1

## 2022-02-02 MED ORDER — STERILE WATER FOR IRRIGATION IR SOLN
Status: DC | PRN
  Administered 2022-02-02: 500 mL

## 2022-02-02 MED ORDER — PHENYLEPHRINE HCL (PRESSORS) 10 MG/ML IV SOLN
INTRAVENOUS | Status: AC
Start: 2022-02-02 — End: ?
  Filled 2022-02-02: qty 1

## 2022-02-02 MED ORDER — BUPIVACAINE LIPOSOME 1.3 % IJ SUSP
INTRAMUSCULAR | Status: AC
  Filled 2022-02-02: qty 20

## 2022-02-02 MED ORDER — TAMSULOSIN HCL 0.4 MG PO CAPS
0.4000 mg | ORAL_CAPSULE | Freq: Two times a day (BID) | ORAL | Status: DC
  Administered 2022-02-02 – 2022-02-06 (×8): 0.4 mg via ORAL
  Filled 2022-02-02 (×8): qty 1

## 2022-02-02 MED ORDER — FENTANYL CITRATE (PF) 100 MCG/2ML IJ SOLN
INTRAMUSCULAR | Status: DC | PRN
  Administered 2022-02-02: 25 ug via INTRAVENOUS
  Administered 2022-02-02: 50 ug via INTRAVENOUS
  Administered 2022-02-02: 25 ug via INTRAVENOUS
  Administered 2022-02-02: 50 ug via INTRAVENOUS

## 2022-02-02 MED ORDER — CHLORHEXIDINE GLUCONATE CLOTH 2 % EX PADS: 6.0000 | MEDICATED_PAD | Freq: Once | CUTANEOUS | Status: DC

## 2022-02-02 MED ORDER — CHLORHEXIDINE GLUCONATE 0.12 % MT SOLN
OROMUCOSAL | Status: AC
  Administered 2022-02-02: 15 mL via OROMUCOSAL
  Filled 2022-02-02: qty 15

## 2022-02-02 MED ORDER — ALVIMOPAN 12 MG PO CAPS
ORAL_CAPSULE | ORAL | Status: AC
  Administered 2022-02-02: 12 mg via ORAL
  Filled 2022-02-02: qty 1

## 2022-02-02 MED ORDER — SIMETHICONE 80 MG PO CHEW: 40.0000 mg | CHEWABLE_TABLET | Freq: Four times a day (QID) | ORAL | Status: DC | PRN

## 2022-02-02 MED ORDER — ROCURONIUM BROMIDE 10 MG/ML (PF) SYRINGE
PREFILLED_SYRINGE | INTRAVENOUS | Status: AC
  Filled 2022-02-02: qty 10

## 2022-02-02 MED ORDER — ZOLPIDEM TARTRATE 5 MG PO TABS
5.0000 mg | ORAL_TABLET | Freq: Every evening | ORAL | Status: DC | PRN
  Administered 2022-02-06: 5 mg via ORAL
  Filled 2022-02-02: qty 1

## 2022-02-02 MED ORDER — OXYCODONE HCL 5 MG PO TABS
5.0000 mg | ORAL_TABLET | ORAL | Status: DC | PRN
  Administered 2022-02-02 – 2022-02-03 (×2): 10 mg via ORAL
  Administered 2022-02-04: 5 mg via ORAL
  Administered 2022-02-05 – 2022-02-06 (×6): 10 mg via ORAL
  Filled 2022-02-02 (×6): qty 2
  Filled 2022-02-02: qty 1
  Filled 2022-02-02 (×2): qty 2

## 2022-02-02 MED ORDER — ENSURE PRE-SURGERY PO LIQD: 592.0000 mL | Freq: Once | ORAL | Status: DC

## 2022-02-02 MED ORDER — PHENYLEPHRINE HCL-NACL 20-0.9 MG/250ML-% IV SOLN
INTRAVENOUS | Status: DC | PRN
  Administered 2022-02-02: 75 ug/min via INTRAVENOUS

## 2022-02-02 MED ORDER — ALPRAZOLAM 1 MG PO TABS
1.0000 mg | ORAL_TABLET | Freq: Three times a day (TID) | ORAL | Status: DC | PRN
  Administered 2022-02-03 – 2022-02-06 (×3): 1 mg via ORAL
  Filled 2022-02-02 (×3): qty 1

## 2022-02-02 MED ORDER — INSULIN ASPART 100 UNIT/ML IJ SOLN
0.0000 [IU] | INTRAMUSCULAR | Status: DC
  Administered 2022-02-02 – 2022-02-03 (×2): 3 [IU] via SUBCUTANEOUS
  Administered 2022-02-03: 2 [IU] via SUBCUTANEOUS
  Administered 2022-02-03: 3 [IU] via SUBCUTANEOUS
  Administered 2022-02-03: 2 [IU] via SUBCUTANEOUS
  Administered 2022-02-03: 3 [IU] via SUBCUTANEOUS
  Administered 2022-02-03 – 2022-02-05 (×7): 2 [IU] via SUBCUTANEOUS
  Administered 2022-02-05: 3 [IU] via SUBCUTANEOUS
  Administered 2022-02-05 – 2022-02-06 (×5): 2 [IU] via SUBCUTANEOUS
  Administered 2022-02-06: 5 [IU] via SUBCUTANEOUS

## 2022-02-02 MED ORDER — ONDANSETRON HCL 4 MG/2ML IJ SOLN: 4.0000 mg | Freq: Four times a day (QID) | INTRAMUSCULAR | Status: DC | PRN

## 2022-02-02 MED ORDER — FENTANYL CITRATE (PF) 100 MCG/2ML IJ SOLN
INTRAMUSCULAR | Status: AC
  Filled 2022-02-02: qty 2

## 2022-02-02 MED ORDER — FENTANYL CITRATE (PF) 100 MCG/2ML IJ SOLN
INTRAMUSCULAR | Status: AC
Start: 2022-02-02 — End: ?
  Filled 2022-02-02: qty 2

## 2022-02-02 MED ORDER — SUGAMMADEX SODIUM 200 MG/2ML IV SOLN
INTRAVENOUS | Status: DC | PRN
  Administered 2022-02-02: 200 mg via INTRAVENOUS

## 2022-02-02 MED ORDER — ROSUVASTATIN CALCIUM 20 MG PO TABS
40.0000 mg | ORAL_TABLET | Freq: Every day | ORAL | Status: DC
Start: 2022-02-03 — End: 2022-02-06
  Administered 2022-02-03 – 2022-02-06 (×4): 40 mg via ORAL
  Filled 2022-02-02 (×4): qty 2

## 2022-02-02 MED ORDER — LIDOCAINE HCL (PF) 2 % IJ SOLN
INTRAMUSCULAR | Status: AC
  Filled 2022-02-02: qty 5

## 2022-02-02 MED ORDER — HEPARIN SODIUM (PORCINE) 5000 UNIT/ML IJ SOLN
INTRAMUSCULAR | Status: AC
  Administered 2022-02-02: 5000 [IU] via SUBCUTANEOUS
  Filled 2022-02-02: qty 1

## 2022-02-02 MED ORDER — ENSURE PRE-SURGERY PO LIQD: 296.0000 mL | Freq: Once | ORAL | Status: DC

## 2022-02-02 MED ORDER — PROPOFOL 10 MG/ML IV BOLUS
INTRAVENOUS | Status: AC
  Filled 2022-02-02: qty 20

## 2022-02-02 MED ORDER — ONDANSETRON HCL 4 MG PO TABS: 4.0000 mg | ORAL_TABLET | Freq: Four times a day (QID) | ORAL | Status: DC | PRN

## 2022-02-02 MED ORDER — ORAL CARE MOUTH RINSE: 15.0000 mL | Freq: Once | OROMUCOSAL | Status: AC

## 2022-02-02 MED ORDER — ALUM & MAG HYDROXIDE-SIMETH 200-200-20 MG/5ML PO SUSP: 30.0000 mL | Freq: Four times a day (QID) | ORAL | Status: DC | PRN

## 2022-02-02 MED ORDER — DEXAMETHASONE SODIUM PHOSPHATE 4 MG/ML IJ SOLN
INTRAMUSCULAR | Status: DC | PRN
  Administered 2022-02-02: 5 mg via INTRAVENOUS

## 2022-02-02 MED ORDER — CHLORHEXIDINE GLUCONATE 0.12 % MT SOLN: 15.0000 mL | Freq: Once | OROMUCOSAL | Status: AC

## 2022-02-02 MED ORDER — MORPHINE SULFATE (PF) 2 MG/ML IV SOLN
2.0000 mg | INTRAVENOUS | Status: DC | PRN
  Administered 2022-02-02 – 2022-02-05 (×15): 2 mg via INTRAVENOUS
  Filled 2022-02-02 (×15): qty 1

## 2022-02-02 MED ORDER — PHENYLEPHRINE 40 MCG/ML (10ML) SYRINGE FOR IV PUSH (FOR BLOOD PRESSURE SUPPORT)
PREFILLED_SYRINGE | INTRAVENOUS | Status: AC
  Filled 2022-02-02: qty 10

## 2022-02-02 MED ORDER — GABAPENTIN 300 MG PO CAPS
300.0000 mg | ORAL_CAPSULE | Freq: Two times a day (BID) | ORAL | Status: DC
  Administered 2022-02-02 – 2022-02-06 (×8): 300 mg via ORAL
  Filled 2022-02-02 (×8): qty 1

## 2022-02-02 MED ORDER — METOPROLOL TARTRATE 5 MG/5ML IV SOLN: 5.0000 mg | Freq: Four times a day (QID) | INTRAVENOUS | Status: DC | PRN

## 2022-02-02 MED ORDER — PHENYLEPHRINE 40 MCG/ML (10ML) SYRINGE FOR IV PUSH (FOR BLOOD PRESSURE SUPPORT)
PREFILLED_SYRINGE | INTRAVENOUS | Status: DC | PRN
  Administered 2022-02-02 (×2): 80 ug via INTRAVENOUS
  Administered 2022-02-02: 120 ug via INTRAVENOUS
  Administered 2022-02-02: 80 ug via INTRAVENOUS
  Administered 2022-02-02: 120 ug via INTRAVENOUS
  Administered 2022-02-02 (×4): 80 ug via INTRAVENOUS

## 2022-02-02 MED ORDER — MIDAZOLAM HCL 2 MG/2ML IJ SOLN
INTRAMUSCULAR | Status: AC
Start: 2022-02-02 — End: ?
  Filled 2022-02-02: qty 2

## 2022-02-02 MED ORDER — MIDAZOLAM HCL 5 MG/5ML IJ SOLN
INTRAMUSCULAR | Status: DC | PRN
  Administered 2022-02-02: 2 mg via INTRAVENOUS

## 2022-02-02 MED ORDER — PHENYLEPHRINE HCL (PRESSORS) 10 MG/ML IV SOLN
INTRAVENOUS | Status: AC
  Filled 2022-02-02: qty 1

## 2022-02-02 MED ORDER — HEPARIN SODIUM (PORCINE) 5000 UNIT/ML IJ SOLN: 5000.0000 [IU] | Freq: Once | INTRAMUSCULAR | Status: AC

## 2022-02-02 MED ORDER — EPHEDRINE SULFATE (PRESSORS) 50 MG/ML IJ SOLN
INTRAMUSCULAR | Status: DC | PRN
  Administered 2022-02-02: 5 mg via INTRAVENOUS
  Administered 2022-02-02: 10 mg via INTRAVENOUS
  Administered 2022-02-02 (×2): 5 mg via INTRAVENOUS

## 2022-02-02 MED ORDER — ONDANSETRON HCL 4 MG/2ML IJ SOLN
INTRAMUSCULAR | Status: AC
  Filled 2022-02-02: qty 2

## 2022-02-02 SURGICAL SUPPLY — 61 items
CLOTH BEACON ORANGE TIMEOUT ST (SAFETY) ×2 IMPLANT
COVER LIGHT HANDLE STERIS (MISCELLANEOUS) ×8 IMPLANT
DRSG OPSITE POSTOP 4X10 (GAUZE/BANDAGES/DRESSINGS) ×3 IMPLANT
DRSG OPSITE POSTOP 4X8 (GAUZE/BANDAGES/DRESSINGS) ×1 IMPLANT
DRSG TEGADERM 4X4.75 (GAUZE/BANDAGES/DRESSINGS) ×2 IMPLANT
ELECT BLADE 6 FLAT ULTRCLN (ELECTRODE) ×3 IMPLANT
ELECT REM PT RETURN 9FT ADLT (ELECTROSURGICAL) ×2
ELECTRODE REM PT RTRN 9FT ADLT (ELECTROSURGICAL) ×1 IMPLANT
GAUZE 4X4 16PLY ~~LOC~~+RFID DBL (SPONGE) ×2 IMPLANT
GAUZE SPONGE 4X4 12PLY STRL (GAUZE/BANDAGES/DRESSINGS) ×2 IMPLANT
GLOVE BIO SURGEON STRL SZ8 (GLOVE) ×3 IMPLANT
GLOVE SURG ENC MOIS LTX SZ6.5 (GLOVE) ×6 IMPLANT
GLOVE SURG LTX SZ6 (GLOVE) ×2 IMPLANT
GLOVE SURG LTX SZ6.5 (GLOVE) ×1 IMPLANT
GLOVE SURG POLYISO LF SZ7.5 (GLOVE) ×6 IMPLANT
GLOVE SURG UNDER POLY LF SZ6.5 (GLOVE) ×3 IMPLANT
GLOVE SURG UNDER POLY LF SZ7 (GLOVE) ×10 IMPLANT
GOWN STRL REUS W/TWL LRG LVL3 (GOWN DISPOSABLE) ×13 IMPLANT
INST SET MAJOR GENERAL (KITS) ×2 IMPLANT
KIT TURNOVER KIT A (KITS) ×2 IMPLANT
LIGASURE IMPACT 36 18CM CVD LR (INSTRUMENTS) ×2 IMPLANT
MANIFOLD NEPTUNE II (INSTRUMENTS) ×2 IMPLANT
MESH PHASIX RESORB RECT 10X15 (Mesh General) ×1 IMPLANT
MESH PHASIX ST 15X20 (Mesh General) ×1 IMPLANT
NDL HYPO 18GX1.5 BLUNT FILL (NEEDLE) ×1 IMPLANT
NEEDLE HYPO 18GX1.5 BLUNT FILL (NEEDLE) ×2 IMPLANT
NS IRRIG 1000ML POUR BTL (IV SOLUTION) ×6 IMPLANT
PACK COLON (CUSTOM PROCEDURE TRAY) ×2 IMPLANT
PAD ARMBOARD 7.5X6 YLW CONV (MISCELLANEOUS) ×2 IMPLANT
PENCIL SMOKE EVACUATOR (MISCELLANEOUS) ×2 IMPLANT
PENCIL SMOKE EVACUATOR COATED (MISCELLANEOUS) ×3 IMPLANT
RELOAD PROXIMATE 75MM BLUE (ENDOMECHANICALS) ×4 IMPLANT
RELOAD STAPLE 75 3.8 BLU REG (ENDOMECHANICALS) IMPLANT
RETRACTOR WND ALEXIS-O 25 LRG (MISCELLANEOUS) ×1 IMPLANT
RTRCTR WOUND ALEXIS O 25CM LRG (MISCELLANEOUS) ×2
SHEET LAVH (DRAPES) ×1 IMPLANT
SPONGE INTESTINAL PEANUT (DISPOSABLE) ×2 IMPLANT
SPONGE T-LAP 18X18 ~~LOC~~+RFID (SPONGE) ×9 IMPLANT
STAPLER AUT SUT 4.8 EEAXL 31 (STAPLE) IMPLANT
STAPLER CIRCULAR MANUAL XL 29 (STAPLE) ×1 IMPLANT
STAPLER GUN LINEAR PROX 60 (STAPLE) ×1 IMPLANT
STAPLER PROXIMATE 75MM BLUE (STAPLE) ×1 IMPLANT
STAPLER RELOADABLE 65 2-0 SUT (MISCELLANEOUS) IMPLANT
STAPLER SYS INTERNAL RELOAD SS (MISCELLANEOUS) ×2 IMPLANT
STAPLER VISISTAT (STAPLE) ×2 IMPLANT
SURGILUBE 2OZ TUBE FLIPTOP (MISCELLANEOUS) ×1 IMPLANT
SUT PDS AB CT VIOLET #0 27IN (SUTURE) ×10 IMPLANT
SUT PROLENE 2 0 SH 30 (SUTURE) ×4 IMPLANT
SUT PROLENE NAB BLUE 3-0 30IN (SUTURE) ×1 IMPLANT
SUT SILK 0 FSL (SUTURE) ×2 IMPLANT
SUT SILK 3 0 SH CR/8 (SUTURE) ×4 IMPLANT
SUT VIC AB 0 CT1 27 (SUTURE) ×10
SUT VIC AB 0 CT1 27XCR 8 STRN (SUTURE) ×2 IMPLANT
SUT VIC AB 3-0 SH 27 (SUTURE) ×2
SUT VIC AB 3-0 SH 27X BRD (SUTURE) IMPLANT
SYR 20ML LL LF (SYRINGE) ×1 IMPLANT
SYR BULB IRRIG 60ML STRL (SYRINGE) ×2 IMPLANT
TRAY FOLEY W/BAG SLVR 16FR (SET/KITS/TRAYS/PACK) ×2
TRAY FOLEY W/BAG SLVR 16FR ST (SET/KITS/TRAYS/PACK) ×1 IMPLANT
WATER STERILE IRR 500ML POUR (IV SOLUTION) ×1 IMPLANT
YANKAUER SUCT BULB TIP 10FT TU (MISCELLANEOUS) ×3 IMPLANT

## 2022-02-02 NOTE — Progress Notes (Signed)
Patient arrived to the floor with and NG tube. There was not an xray to confirm placement per protocol. I notified Donavan Foil, the AD. She secure chatted Dr. Constance Haw. Dr. Constance Haw said that she has confirmed placement by auscultation and that NG tube was fine to use because we were only using it to suction, not for meds. ?

## 2022-02-02 NOTE — Anesthesia Procedure Notes (Signed)
Procedure Name: Intubation ?Date/Time: 02/02/2022 7:46 AM ?Performed by: Lieutenant Diego, CRNA ?Pre-anesthesia Checklist: Patient identified, Emergency Drugs available, Suction available and Patient being monitored ?Patient Re-evaluated:Patient Re-evaluated prior to induction ?Oxygen Delivery Method: Circle system utilized ?Preoxygenation: Pre-oxygenation with 100% oxygen ?Induction Type: IV induction ?Ventilation: Mask ventilation without difficulty ?Laryngoscope Size: Sabra Heck and 2 ?Grade View: Grade I ?Tube type: Oral ?Tube size: 7.5 mm ?Number of attempts: 1 ?Airway Equipment and Method: Stylet ?Placement Confirmation: ETT inserted through vocal cords under direct vision, positive ETCO2 and breath sounds checked- equal and bilateral ?Secured at: 23 cm ?Tube secured with: Tape ?Dental Injury: Teeth and Oropharynx as per pre-operative assessment  ?Comments: When masking prior to intubation, patients teeth cut his lip. ? ? ? ? ?

## 2022-02-02 NOTE — Transfer of Care (Signed)
Immediate Anesthesia Transfer of Care Note ? ?Patient: Francisco Allen ? ?Procedure(s) Performed: COLOSTOMY REVERSAL with small bowel resection (Abdomen) ?HERNIA REPAIR PARASTOMAL AND VENTRAL WITH PHASIX MESH (Abdomen) ? ?Patient Location: PACU ? ?Anesthesia Type:General ? ?Level of Consciousness: awake ? ?Airway & Oxygen Therapy: Patient Spontanous Breathing and Patient connected to face mask oxygen ? ?Post-op Assessment: Report given to RN and Post -op Vital signs reviewed and stable ? ?Post vital signs: Reviewed and stable ? ?Last Vitals:  ?Vitals Value Taken Time  ?BP 86/60 02/02/22 1301  ?Temp    ?Pulse 91 02/02/22 1302  ?Resp 24 02/02/22 1302  ?SpO2 95 % 02/02/22 1302  ?Vitals shown include unvalidated device data. ? ?Last Pain:  ?Vitals:  ? 02/02/22 0642  ?TempSrc: Oral  ?PainSc: 4   ?   ? ?  ? ?Complications: No notable events documented. ?

## 2022-02-02 NOTE — Op Note (Addendum)
[facility] ?Operative Note ? ?02/02/22 ? ?Preoperative Diagnosis:  Colostomy in place, parastomal hernia  ?  ?Postoperative Diagnosis: Colostomy in place, small bowel adhesions, parastomal hernia 5X5cm, ventral hernia 3X8cm  ?  ?Procedure(s) Performed: Colostomy reversal, splenic flexure take down, small bowel resection, parastomal hernia retrorectus repair with myofascia debridement and flap creation and placement of 10.2X 15.2cm Phasix uncoated mesh, and ventral hernia repair with underlay 15X20cm Phasix ST (coated) mesh ?  ?Surgeon: Lanell Matar. Constance Haw, MD ?  ?Assistants: Aviva Signs, MD and Graciella Freer, DO ?  ?Anesthesia: General endotracheal ?  ?Anesthesiologist: Louann Sjogren, MD  ?  ?Specimens: End colostomy, 2 donuts, small bowel  ?  ?Estimated Blood Loss: 150 cc ?  ?Blood Replacement: None  ?  ?Complications: None  ? ?Wound Class: Contaminated  ?  ?Operative Indications:  Mr. Koury is a 61 yo with a colostomy in place after developing a large bowel obstruction related to a sigmoid stricture. He recovered from this and had to be evaluated by his PCP, Urology and Nephrology post op to get him ready for a reversal surgery. He also developed diabetes with an elevated Hgb A1C and this was corrected prior to his reversal. We discussed reversal and risk of bleeding, infection, anastomotic leak, ureter injury, use of an absorbable mesh for hernia repair. ? ?Findings: Parastomal hernia, ventral hernia with scar, adhesions of the small bowel to the anterior abdominal wall, requiring lysis and ultimate small bowel resection  ?  ?Procedure: The patient was taken to the operating room and placed supine. General endotracheal anesthesia was induced. Intravenous antibiotics were  administered per protocol.  An orogastric tube positioned to decompress the stomach. A foley was placed and his was put in lithotomy position with all pressure points padded. The colostomy was closed with a running 0  silk suture. The abdomen and perineum were prepared and draped in the usual sterile fashion.  ? ?A midline incision was made and carried down to the scar/ fascia and the abdomen was entered carefully with scissors. There were small bowel loops adhesed to the anterior abdominal wall that were taken down the scissors and sharp dissection. There were also adhesions of the small bowel loops into the pelvis.  These were taken down with sharp dissection.  The small bowel had some serosal tears that were oversewn with 3-0 silk suture. The small bowel was packed into the right upper quadrant. The peritoneal reflection anteriorly into the pelvis and the retroperitoneal fat from the pelvic brim were folding over and almost encasing the rectal stump. With blunt dissection, I was able to free up the stump.  ? ?The left ureter was deep within the retroperitoneal fat layer and was identified and protected.  ? ?From here, the colostomy was taken down at the skin with an ellipse of skin around the colostomy and the colostomy was freed up from the subcutaneous layer and hernia sac.  A 75 mm linear stapler was taken across the distal end to keep it from leaking. This was passed off as a specimen. This was freed and placed intraabdominal. I freed up the line line of Told and the splenic flexure with blunt dissection and Ligasure. The colostomy end was easily placed into the pelvis without any tension. ? ?My assistant went below and a 29 EEA sizer was easily passed. The autopursestringer was used on the proximal end and unfortunately did not fully take to the colon and pulled out. I then used a 2-0 prolene to  go around the end twice with a purse-string suture around the anvil to create a sufficient donut.   ? ?The EEA 29 stapler was then placed by my assistant and deployed. The anvil and the stapler were united and brought together, ensuring no tension and no tissue or other structures getting in the staple line.  The stapler was fired  and removed. Two healthy donuts that were intact were found. A leak test was done with a bulb syringe and saline in the pelvis. No leaks were identified. I oversewned the anterior part of the anastomosis with 3-0 Lembert suture and the peritoneal reflection and abundant fat in the area encircled the anastmosis.  ? ?The small bowel was ran and there was one area that had serosal injuries in close proximity and was pretty beat up. This was resected using 75 mm linear cutting stapler. The side to side anastomosis was performed in the standard fashion with a 75 mm linear cutting stapler and a TA 60. The staple line was oversewn with 3-0 Lembert Silk suture, tucking the mesenteric defect over the staple line and closing the defect. Two crotch sutures were placed with 3-0 Silk.  ? ?The remaining small bowel was ran multiple times and 1 additional serosal tear was noted and oversewn with a 3-0 Silk lembert suture.  ? ?An NG was exchanged and verified.  Irrigation was done. Hemostasis was confirmed.  ? ?The entire team changed gown and gloves and new equipment was used for closing. The parastomal hernia was noted and the posterior fascia was debrided off the rectus muscle to free it up doing a myofascia release to get this fascia re-approximated. The defect measured about 5X5cm. This was closed with interrupted 0 Vicryl suture. A Phasix uncoated absorbable 10.2X15.2 cm mesh was placed over the posterior fascia that had been freed up and mobilized.  This was trimmed slightly to lay flat and was tacked to a posterior fascia with 0 Vicyrl suture. There was one suture that caught a segment of small bowel and this was cut out. The small bowel was oversewn with multiple Lembert 3-0 Silk suture at the area where a pinpoint enterotomy was presumed.   ? ?The anterior fascia over the stoma site was also freed up and debrided from the rectus muscle with myofascia release and was closed with 0 Vicryl interrupted sutures. Hemostasis of  each layer was confirmed.  Exparel was injected into the fascia here and in the midline. The subcutaneous tissue was re-approximated with a running 3-0 Vicryl.  ? ?Attention was then turned to the midline where there was significant scar and not true fascia for a defect of about 3X8cm in size. The fascia was attenuated superiorly but inferiorly it only appeared to be old scar.  Here a underlay of Phasix ST 20X15cm mesh was used and tacked to the anterior abdominal wall in multiple locations with 0 Vicyrl suture, ensuring no bowel was tacked up. This was closed in the circumference of the mesh. Flaps in the subcutaneous tissue were created to get the midline fascia and scar more midline and this was closed with interrupted 0 PDS suture. The old skin scar was excised from his secondary intention healing, and the wounds were irrigated and made hemostatic. The midline and the colostomy site were then closed with staples and covered with a honeycomb dressing.  ? ?Dr. Arnoldo Morale helped with the initial portion of the case but had to leave and Dr. Okey Dupre helped with the anastomoses and hernia repair, the  critical portions of the case.  ? ?Final inspection revealed acceptable hemostasis. All counts were correct at the end of the case. The patient was awakened from anesthesia and extubated without complication.  The patient went to the PACU in stable condition. ?  ?Curlene Labrum, MD ?[facility] ?[place], [place] [address] ?[phone] (office) ?  ?

## 2022-02-02 NOTE — Plan of Care (Signed)

## 2022-02-02 NOTE — Interval H&P Note (Signed)
History and Physical Interval Note: ? ?02/02/2022 ?7:25 AM ? ?[name]  has presented today for surgery, with the diagnosis of Colostomy in place.  The various methods of treatment have been discussed with the patient and family. After consideration of risks, benefits and other options for treatment, the patient has consented to  Procedure(s): ?COLOSTOMY REVERSAL (N/A) as a surgical intervention.  The patient's history has been reviewed, patient examined, no change in status, stable for surgery.  I have reviewed the patient's chart and labs.  Questions were answered to the patient's satisfaction.   ? ?Questions answered. Prep completed. May use phasix mesh if needed.  ?[name] ? ? ?

## 2022-02-02 NOTE — Anesthesia Preprocedure Evaluation (Signed)
Anesthesia Evaluation  ?Patient identified by MRN, date of birth, ID band ?Patient awake ? ? ? ?Reviewed: ?Allergy & Precautions, H&P , NPO status , Patient's Chart, lab work & pertinent test results, reviewed documented beta blocker date and time  ? ?Airway ?Mallampati: II ? ?TM Distance: >3 FB ?Neck ROM: full ? ? ? Dental ?no notable dental hx. ? ?  ?Pulmonary ?neg pulmonary ROS, Current Smoker,  ?  ?Pulmonary exam normal ?breath sounds clear to auscultation ? ? ? ? ? ? Cardiovascular ?Exercise Tolerance: Good ?hypertension, negative cardio ROS ? ? ?Rhythm:regular Rate:Normal ? ? ?  ?Neuro/Psych ? Headaches, PSYCHIATRIC DISORDERS Anxiety   ? GI/Hepatic ?Neg liver ROS, GERD  Medicated,  ?Endo/Other  ?diabetes, Type 2 ? Renal/GU ?CRFRenal disease  ?negative genitourinary ?  ?Musculoskeletal ? ? Abdominal ?  ?Peds ? Hematology ?negative hematology ROS ?(+)   ?Anesthesia Other Findings ? ? Reproductive/Obstetrics ?negative OB ROS ? ?  ? ? ? ? ? ? ? ? ? ? ? ? ? ?  ?  ? ? ? ? ? ? ? ? ?Anesthesia Physical ?Anesthesia Plan ? ?ASA: 3 ? ?Anesthesia Plan: General and General ETT  ? ?Post-op Pain Management:   ? ?Induction:  ? ?PONV Risk Score and Plan: Ondansetron ? ?Airway Management Planned:  ? ?Additional Equipment:  ? ?Intra-op Plan:  ? ?Post-operative Plan:  ? ?Informed Consent: I have reviewed the patients History and Physical, chart, labs and discussed the procedure including the risks, benefits and alternatives for the proposed anesthesia with the patient or authorized representative who has indicated his/her understanding and acceptance.  ? ? ? ?Dental Advisory Given ? ?Plan Discussed with: CRNA ? ?Anesthesia Plan Comments:   ? ? ? ? ? ? ?Anesthesia Quick Evaluation ? ?

## 2022-02-02 NOTE — Progress Notes (Addendum)
[facility] ? ?Updated wife.  ? ?Telemetry bed ?ERAS pain ?NPO NG expect for oral meds ?SSI ordered  ?Binder in place ?Labs in AM ?LR @ 100 ?Foley in place ?SCDs, Heparin sq ? ?Curlene Labrum, MD ?[facility] ?[place], [place] [address] ?[phone] (office) ? ? ? ?

## 2022-02-03 LAB — CBC WITH DIFFERENTIAL/PLATELET
Abs Immature Granulocytes: 0.07 10*3/uL (ref 0.00–0.07)
Basophils Absolute: 0 10*3/uL (ref 0.0–0.1)
Basophils Relative: 0 %
Eosinophils Absolute: 0 10*3/uL (ref 0.0–0.5)
Eosinophils Relative: 0 %
HCT: 47.5 % (ref 39.0–52.0)
Hemoglobin: 14.8 g/dL (ref 13.0–17.0)
Immature Granulocytes: 1 %
Lymphocytes Relative: 7 %
Lymphs Abs: 0.9 10*3/uL (ref 0.7–4.0)
MCH: 27.9 pg (ref 26.0–34.0)
MCHC: 31.2 g/dL (ref 30.0–36.0)
MCV: 89.6 fL (ref 80.0–100.0)
Monocytes Absolute: 1.2 10*3/uL — ABNORMAL HIGH (ref 0.1–1.0)
Monocytes Relative: 9 %
Neutro Abs: 11.2 10*3/uL — ABNORMAL HIGH (ref 1.7–7.7)
Neutrophils Relative %: 83 %
Platelets: 145 10*3/uL — ABNORMAL LOW (ref 150–400)
RBC: 5.3 MIL/uL (ref 4.22–5.81)
RDW: 15 % (ref 11.5–15.5)
WBC: 13.5 10*3/uL — ABNORMAL HIGH (ref 4.0–10.5)
nRBC: 0 % (ref 0.0–0.2)

## 2022-02-03 LAB — GLUCOSE, CAPILLARY
Glucose-Capillary: 125 mg/dL — ABNORMAL HIGH (ref 70–99)
Glucose-Capillary: 126 mg/dL — ABNORMAL HIGH (ref 70–99)
Glucose-Capillary: 133 mg/dL — ABNORMAL HIGH (ref 70–99)
Glucose-Capillary: 134 mg/dL — ABNORMAL HIGH (ref 70–99)
Glucose-Capillary: 158 mg/dL — ABNORMAL HIGH (ref 70–99)
Glucose-Capillary: 169 mg/dL — ABNORMAL HIGH (ref 70–99)
Glucose-Capillary: 170 mg/dL — ABNORMAL HIGH (ref 70–99)

## 2022-02-03 LAB — BASIC METABOLIC PANEL
Anion gap: 5 (ref 5–15)
BUN: 19 mg/dL (ref 8–23)
CO2: 26 mmol/L (ref 22–32)
Calcium: 8.5 mg/dL — ABNORMAL LOW (ref 8.9–10.3)
Chloride: 107 mmol/L (ref 98–111)
Creatinine, Ser: 1.45 mg/dL — ABNORMAL HIGH (ref 0.61–1.24)
GFR, Estimated: 54 mL/min — ABNORMAL LOW (ref >60)
Glucose, Bld: 154 mg/dL — ABNORMAL HIGH (ref 70–99)
Potassium: 4.8 mmol/L (ref 3.5–5.1)
Sodium: 138 mmol/L (ref 135–145)

## 2022-02-03 LAB — MAGNESIUM: Magnesium: 1.6 mg/dL — ABNORMAL LOW (ref 1.7–2.4)

## 2022-02-03 LAB — PHOSPHORUS: Phosphorus: 4.3 mg/dL (ref 2.5–4.6)

## 2022-02-03 MED ORDER — ACETAMINOPHEN 160 MG/5ML PO SOLN
1000.0000 mg | Freq: Four times a day (QID) | ORAL | Status: DC
  Administered 2022-02-03 – 2022-02-05 (×8): 1000 mg via ORAL
  Filled 2022-02-03 (×9): qty 40.6

## 2022-02-03 MED ORDER — CHLORHEXIDINE GLUCONATE CLOTH 2 % EX PADS
6.0000 | MEDICATED_PAD | Freq: Every day | CUTANEOUS | Status: DC
  Administered 2022-02-03 – 2022-02-04 (×2): 6 via TOPICAL

## 2022-02-03 MED ORDER — MAGNESIUM SULFATE 2 GM/50ML IV SOLN
2.0000 g | Freq: Once | INTRAVENOUS | Status: AC
  Administered 2022-02-03: 2 g via INTRAVENOUS
  Filled 2022-02-03: qty 50

## 2022-02-03 MED ORDER — PHENOL 1.4 % MT LIQD: 1.0000 | OROMUCOSAL | Status: DC | PRN

## 2022-02-03 NOTE — Progress Notes (Addendum)
[facility] Progress Note ? ?1 Day Post-Op  ?Subjective: ?Doing well. No BM or flatus. Feels tight. UOP good overnight.  ? ?Objective: ?Vital signs in last 24 hours: ?Temp:  [97.7 ?F (36.5 ?C)-98.3 ?F (36.8 ?C)] 98.3 ?F (36.8 ?C) (03/28 0449) ?Pulse Rate:  [77-102] 94 (03/28 0449) ?Resp:  [15-22] 19 (03/27 2046) ?BP: (77-118)/(55-88) 104/88 (03/28 0449) ?SpO2:  [91 %-99 %] 93 % (03/28 0449) ?Weight:  [116.7 kg] 116.7 kg (03/27 1524) ?Last BM Date : 02/01/22 ? ?Intake/Output from previous day: ?03/27 0701 - 03/28 0700 ?In: 3010.6 [I.V.:2899.9; IV Piggyback:110.8] ?Out: 1850 [Urine:1000; Emesis/NG output:700; Blood:150] ?Intake/Output this shift: ?No intake/output data recorded. ? ?General appearance: alert and no distress ?Resp: normal work of breathing ?GI: distended, appropriately tender, incisions c/d/I with honeycomb, no erythema or drainage ? ?Lab Results:  ?Recent Labs  ?  02/03/22 ?0422  ?WBC 13.5*  ?HGB 14.8  ?HCT 47.5  ?PLT 145*  ? ?BMET ?Recent Labs  ?  02/03/22 ?0422  ?NA 138  ?K 4.8  ?CL 107  ?CO2 26  ?GLUCOSE 154*  ?BUN 19  ?CREATININE 1.45*  ?CALCIUM 8.5*  ? ?PT/INR ?No results for input(s): LABPROT, INR in the last 72 hours. ? ?Studies/Results: ?No results found. ? ?Anti-infectives: ?Anti-infectives (From admission, onward)  ? ? Start     Dose/Rate Route Frequency Ordered Stop  ? 02/02/22 1800  clindamycin (CLEOCIN) IVPB 900 mg       ? 900 mg ?100 mL/hr over 30 Minutes Intravenous Every 8 hours 02/02/22 1616 02/02/22 2025  ? 02/02/22 0600  clindamycin (CLEOCIN) IVPB 900 mg       ?See Hyperspace for full Linked Orders Report.  ? 900 mg ?100 mL/hr over 30 Minutes Intravenous 60 min pre-op 01/30/22 0936 02/02/22 0805  ? 02/02/22 0600  gentamicin (GARAMYCIN) 430 mg in dextrose 5 % 100 mL IVPB       ?See Hyperspace for full Linked Orders Report.  ? 430 mg ?110.8 mL/hr over 60 Minutes Intravenous 60 min pre-op 01/30/22 0936 02/02/22 0835  ? ?  ? ? ?Assessment/Plan: ?Patient doing well s/p  COLOSTOMY REVERSAL with small bowel resection ?HERNIA REPAIR PARASTOMAL AND VENTRAL WITH PHASIX MESH. ?Scheduled tylenol, changing to liquid due to pills sticking ?IS, OOB ?HD ok, home meds ?NPO, Ng except for meds and ice ?Awaiting bowel function given the SBR ?Type II DM controlled with hyperglycemia, on ozempic as outpatient ?SSI ordered, will touch base with diabetes coordinator about his post op plans for discharge given the ozempic due on Wednesday ?H&H down some ?Leukocytosis post op expected, monitor ?UOP good, foley out, Hypomagnesia, Mag replaced, LR @ 100 ?Chronic kidney disease Stage 3 per the PCP and Nephrology documentation ?SCDs, heparin sq  ? ? LOS: 1 day  ? ? ?[name] ?02/03/2022 ? ?

## 2022-02-03 NOTE — Progress Notes (Signed)
NT called and reported that pt's honeycomb dressing was bleeding and he bled through his abdominal binder. On call surgeon Dr. Arnoldo Morale was paged via [facility] and Dr. Constance Haw was contacted via secure chat by nurse Loree Fee. Was instructed by MD to reinforce dressing and replace abdominal binder. This nurse along with Ander Purpura, LPN, [place], LPN, and [place], NT cleaned pt. Staples were still in place and incision was no longer bleeding, reinforced pt dressing with abd pads and a new abdominal binder. Pt is stable and calm at this time. Reported findings to oncoming nurse. ?

## 2022-02-03 NOTE — Anesthesia Postprocedure Evaluation (Signed)
Anesthesia Post Note ? ?Patient: Francisco Allen ? ?Procedure(s) Performed: COLOSTOMY REVERSAL with small bowel resection (Abdomen) ?HERNIA REPAIR PARASTOMAL AND VENTRAL WITH PHASIX MESH (Abdomen) ? ?Patient location during evaluation: Phase II ?Anesthesia Type: General ?Level of consciousness: awake ?Pain management: pain level controlled ?Vital Signs Assessment: post-procedure vital signs reviewed and stable ?Respiratory status: spontaneous breathing and respiratory function stable ?Cardiovascular status: blood pressure returned to baseline and stable ?Postop Assessment: no headache and no apparent nausea or vomiting ?Anesthetic complications: no ?Comments: Late entry ? ? ?No notable events documented. ? ? ?Last Vitals:  ?Vitals:  ? 02/02/22 2046 02/03/22 0449  ?BP: 112/73 104/88  ?Pulse: (!) 102 94  ?Resp: 19   ?Temp: 36.5 ?C 36.8 ?C  ?SpO2: 92% 93%  ?  ?Last Pain:  ?Vitals:  ? 02/03/22 0845  ?TempSrc:   ?PainSc: 2   ? ? ?  ?  ?  ?  ?  ?  ? ?Louann Sjogren ? ? ? ? ?

## 2022-02-03 NOTE — Progress Notes (Signed)
NG confirmed intraoperative.

## 2022-02-03 NOTE — Progress Notes (Signed)
Changed lower ABD pad due to small amount of bloody drainage no active bleeding noted to incision site. ?

## 2022-02-03 NOTE — Progress Notes (Signed)
Removed pt foley as per MD order. Pt tolerated well.  ?

## 2022-02-04 ENCOUNTER — Encounter (HOSPITAL_COMMUNITY): Payer: Self-pay | Admitting: General Surgery

## 2022-02-04 LAB — BASIC METABOLIC PANEL
Anion gap: 4 — ABNORMAL LOW (ref 5–15)
BUN: 21 mg/dL (ref 8–23)
CO2: 29 mmol/L (ref 22–32)
Calcium: 8.3 mg/dL — ABNORMAL LOW (ref 8.9–10.3)
Chloride: 106 mmol/L (ref 98–111)
Creatinine, Ser: 1.26 mg/dL — ABNORMAL HIGH (ref 0.61–1.24)
GFR, Estimated: >60 mL/min (ref >60)
Glucose, Bld: 121 mg/dL — ABNORMAL HIGH (ref 70–99)
Potassium: 4 mmol/L (ref 3.5–5.1)
Sodium: 139 mmol/L (ref 135–145)

## 2022-02-04 LAB — GLUCOSE, CAPILLARY
Glucose-Capillary: 115 mg/dL — ABNORMAL HIGH (ref 70–99)
Glucose-Capillary: 121 mg/dL — ABNORMAL HIGH (ref 70–99)
Glucose-Capillary: 125 mg/dL — ABNORMAL HIGH (ref 70–99)
Glucose-Capillary: 126 mg/dL — ABNORMAL HIGH (ref 70–99)
Glucose-Capillary: 138 mg/dL — ABNORMAL HIGH (ref 70–99)
Glucose-Capillary: 141 mg/dL — ABNORMAL HIGH (ref 70–99)

## 2022-02-04 LAB — CBC WITH DIFFERENTIAL/PLATELET
Abs Immature Granulocytes: 0.05 10*3/uL (ref 0.00–0.07)
Basophils Absolute: 0 10*3/uL (ref 0.0–0.1)
Basophils Relative: 0 %
Eosinophils Absolute: 0 10*3/uL (ref 0.0–0.5)
Eosinophils Relative: 0 %
HCT: 39.5 % (ref 39.0–52.0)
Hemoglobin: 12.1 g/dL — ABNORMAL LOW (ref 13.0–17.0)
Immature Granulocytes: 1 %
Lymphocytes Relative: 14 %
Lymphs Abs: 1.4 10*3/uL (ref 0.7–4.0)
MCH: 27.7 pg (ref 26.0–34.0)
MCHC: 30.6 g/dL (ref 30.0–36.0)
MCV: 90.4 fL (ref 80.0–100.0)
Monocytes Absolute: 0.8 10*3/uL (ref 0.1–1.0)
Monocytes Relative: 8 %
Neutro Abs: 8.1 10*3/uL — ABNORMAL HIGH (ref 1.7–7.7)
Neutrophils Relative %: 77 %
Platelets: 130 10*3/uL — ABNORMAL LOW (ref 150–400)
RBC: 4.37 MIL/uL (ref 4.22–5.81)
RDW: 14.9 % (ref 11.5–15.5)
WBC: 10.4 10*3/uL (ref 4.0–10.5)
nRBC: 0 % (ref 0.0–0.2)

## 2022-02-04 LAB — SURGICAL PATHOLOGY

## 2022-02-04 LAB — PHOSPHORUS: Phosphorus: 2.4 mg/dL — ABNORMAL LOW (ref 2.5–4.6)

## 2022-02-04 LAB — MAGNESIUM: Magnesium: 2.3 mg/dL (ref 1.7–2.4)

## 2022-02-04 MED ORDER — POTASSIUM & SODIUM PHOSPHATES 280-160-250 MG PO PACK
2.0000 | PACK | Freq: Three times a day (TID) | ORAL | Status: AC
  Administered 2022-02-04 (×2): 2 via ORAL
  Filled 2022-02-04 (×2): qty 2

## 2022-02-04 NOTE — Progress Notes (Addendum)
[facility] ? ?Doing well but no Bms. No further drainage from incision. ? ?BP 134/82 (BP Location: Right Arm)   Pulse 92   Temp 98 ?F (36.7 ?C)   Resp 19   Ht '5\' 9"'$  (1.753 m)   Wt 117.4 kg   SpO2 91%   BMI 38.22 kg/m?  ?Midline with staples and colostomy with staples, dried blood on ABD, replaced ? ?Labs reviewed ?H&H drifting but ok ?No leukocytosis ?Hypophosphoremia  ? ?Patient doing well s/p COLOSTOMY REVERSAL with small bowel resection ?HERNIA REPAIR PARASTOMAL AND VENTRAL WITH PHASIX MESH. ?Scheduled tylenol, PRN narcotics ?IS, OOB ?HD ok, home meds ?NPO, Ng except for meds and ice ?Awaiting bowel function given the SBR ?Type II DM controlled with hyperglycemia, on ozempic as outpatient ?SSI ordered ?H&H down some, monitor  ?UOP good, Phos replaced, LR @ 100 ?Chronic kidney disease Stage 3 per the PCP and Nephrology documentation, improved GFR with IVF  ?SCDs, heparin sq  ? ?DC monitor  ?Ambulate in halls today ? ?Curlene Labrum, MD ?[facility] ?[place], [place] [address] ?[phone] (office) ? ?

## 2022-02-05 ENCOUNTER — Other Ambulatory Visit: Payer: Self-pay

## 2022-02-05 LAB — BASIC METABOLIC PANEL
Anion gap: 6 (ref 5–15)
BUN: 19 mg/dL (ref 8–23)
CO2: 33 mmol/L — ABNORMAL HIGH (ref 22–32)
Calcium: 8.6 mg/dL — ABNORMAL LOW (ref 8.9–10.3)
Chloride: 100 mmol/L (ref 98–111)
Creatinine, Ser: 1.18 mg/dL (ref 0.61–1.24)
GFR, Estimated: >60 mL/min (ref >60)
Glucose, Bld: 120 mg/dL — ABNORMAL HIGH (ref 70–99)
Potassium: 3.4 mmol/L — ABNORMAL LOW (ref 3.5–5.1)
Sodium: 139 mmol/L (ref 135–145)

## 2022-02-05 LAB — CBC WITH DIFFERENTIAL/PLATELET
Abs Immature Granulocytes: 0.06 10*3/uL (ref 0.00–0.07)
Basophils Absolute: 0.1 10*3/uL (ref 0.0–0.1)
Basophils Relative: 1 %
Eosinophils Absolute: 0.2 10*3/uL (ref 0.0–0.5)
Eosinophils Relative: 2 %
HCT: 38.2 % — ABNORMAL LOW (ref 39.0–52.0)
Hemoglobin: 11.9 g/dL — ABNORMAL LOW (ref 13.0–17.0)
Immature Granulocytes: 1 %
Lymphocytes Relative: 18 %
Lymphs Abs: 1.5 10*3/uL (ref 0.7–4.0)
MCH: 28.2 pg (ref 26.0–34.0)
MCHC: 31.2 g/dL (ref 30.0–36.0)
MCV: 90.5 fL (ref 80.0–100.0)
Monocytes Absolute: 0.5 10*3/uL (ref 0.1–1.0)
Monocytes Relative: 6 %
Neutro Abs: 6.2 10*3/uL (ref 1.7–7.7)
Neutrophils Relative %: 72 %
Platelets: 146 10*3/uL — ABNORMAL LOW (ref 150–400)
RBC: 4.22 MIL/uL (ref 4.22–5.81)
RDW: 14.7 % (ref 11.5–15.5)
WBC: 8.6 10*3/uL (ref 4.0–10.5)
nRBC: 0 % (ref 0.0–0.2)

## 2022-02-05 LAB — PHOSPHORUS: Phosphorus: 2.7 mg/dL (ref 2.5–4.6)

## 2022-02-05 LAB — GLUCOSE, CAPILLARY
Glucose-Capillary: 120 mg/dL — ABNORMAL HIGH (ref 70–99)
Glucose-Capillary: 143 mg/dL — ABNORMAL HIGH (ref 70–99)
Glucose-Capillary: 147 mg/dL — ABNORMAL HIGH (ref 70–99)
Glucose-Capillary: 131 mg/dL — ABNORMAL HIGH (ref 70–99)
Glucose-Capillary: 171 mg/dL — ABNORMAL HIGH (ref 70–99)

## 2022-02-05 MED ORDER — POTASSIUM CHLORIDE 20 MEQ PO PACK
40.0000 meq | PACK | Freq: Once | ORAL | Status: AC
  Administered 2022-02-05: 40 meq via ORAL
  Filled 2022-02-05: qty 2

## 2022-02-05 MED ORDER — ACETAMINOPHEN 500 MG PO TABS
1000.0000 mg | ORAL_TABLET | Freq: Four times a day (QID) | ORAL | Status: DC
  Administered 2022-02-05 – 2022-02-06 (×5): 1000 mg via ORAL
  Filled 2022-02-05 (×5): qty 2

## 2022-02-05 MED ORDER — DOCUSATE SODIUM 100 MG PO CAPS
100.0000 mg | ORAL_CAPSULE | Freq: Two times a day (BID) | ORAL | Status: DC
  Administered 2022-02-05 – 2022-02-06 (×3): 100 mg via ORAL
  Filled 2022-02-05 (×3): qty 1

## 2022-02-05 NOTE — Progress Notes (Signed)
NG tube removed, patient started on clear liquids.  ?

## 2022-02-05 NOTE — Progress Notes (Signed)
[facility] Progress Note ? ?3 Days Post-Op  ?Subjective: ?Had bms. Wants to eat. Tolerated clears this AM.  ?BS have been good.  ? ?Objective: ?Vital signs in last 24 hours: ?Temp:  [97.7 ?F (36.5 ?C)-98.6 ?F (37 ?C)] 97.7 ?F (36.5 ?C) (03/30 0400) ?Pulse Rate:  [79-93] 79 (03/30 0400) ?Resp:  [16-18] 16 (03/29 2107) ?BP: (130-154)/(83-85) 130/83 (03/30 0400) ?SpO2:  [90 %-95 %] 91 % (03/30 0400) ?Weight:  [114.6 kg] 114.6 kg (03/30 0405) ?Last BM Date : 02/04/22 ? ?Intake/Output from previous day: ?03/29 0701 - 03/30 0700 ?In: 720 [P.O.:720] ?Out: 5100 [Urine:2300; Emesis/NG output:2800] ?Intake/Output this shift: ?Total I/O ?In: 360 [P.O.:360] ?Out: 250 [Urine:250] ? ?General appearance: alert and no distress ?GI: soft, bruising inferior on the incision, some minor drainage, likely hematoma, no erythema  ? ?Lab Results:  ?Recent Labs  ?  02/04/22 ?0446 02/05/22 ?2725  ?WBC 10.4 8.6  ?HGB 12.1* 11.9*  ?HCT 39.5 38.2*  ?PLT 130* 146*  ? ?BMET ?Recent Labs  ?  02/04/22 ?0446 02/05/22 ?0414  ?NA 139 139  ?K 4.0 3.4*  ?CL 106 100  ?CO2 29 33*  ?GLUCOSE 121* 120*  ?BUN 21 19  ?CREATININE 1.26* 1.18  ?CALCIUM 8.3* 8.6*  ? ?PT/INR ?No results for input(s): LABPROT, INR in the last 72 hours. ? ?Studies/Results: ?No results found. ? ?Anti-infectives: ?Anti-infectives (From admission, onward)  ? ? Start     Dose/Rate Route Frequency Ordered Stop  ? 02/02/22 1800  clindamycin (CLEOCIN) IVPB 900 mg       ? 900 mg ?100 mL/hr over 30 Minutes Intravenous Every 8 hours 02/02/22 1616 02/02/22 2025  ? 02/02/22 0600  clindamycin (CLEOCIN) IVPB 900 mg       ?See Hyperspace for full Linked Orders Report.  ? 900 mg ?100 mL/hr over 30 Minutes Intravenous 60 min pre-op 01/30/22 0936 02/02/22 0805  ? 02/02/22 0600  gentamicin (GARAMYCIN) 430 mg in dextrose 5 % 100 mL IVPB       ?See Hyperspace for full Linked Orders Report.  ? 430 mg ?110.8 mL/hr over 60 Minutes Intravenous 60 min pre-op 01/30/22 0936 02/02/22 0835  ? ?   ? ? ?Assessment/Plan: ?Patient doing well s/p COLOSTOMY REVERSAL with small bowel resection ?HERNIA REPAIR PARASTOMAL AND VENTRAL WITH PHASIX MESH. ?Scheduled tylenol, PRN narcotics ?IS, OOB ?HD ok, home meds ?Soft diet, go slow  ?Colace, d/c entereg  ?Type II DM controlled with hyperglycemia, on ozempic as outpatient ?SSI ordered ?H&H stablized  ?UOP good, K replaced dc fluids  ?Chronic kidney disease Stage 3 per the PCP and Nephrology documentation, improved GFR with IVF  ?SCDs, heparin sq  ?  ? LOS: 3 days  ? ? ?[name] ?02/05/2022 ? ?

## 2022-02-06 LAB — GLUCOSE, CAPILLARY
Glucose-Capillary: 118 mg/dL — ABNORMAL HIGH (ref 70–99)
Glucose-Capillary: 125 mg/dL — ABNORMAL HIGH (ref 70–99)
Glucose-Capillary: 140 mg/dL — ABNORMAL HIGH (ref 70–99)
Glucose-Capillary: 202 mg/dL — ABNORMAL HIGH (ref 70–99)

## 2022-02-06 LAB — BASIC METABOLIC PANEL
Anion gap: 8 (ref 5–15)
BUN: 17 mg/dL (ref 8–23)
CO2: 31 mmol/L (ref 22–32)
Calcium: 8.3 mg/dL — ABNORMAL LOW (ref 8.9–10.3)
Chloride: 97 mmol/L — ABNORMAL LOW (ref 98–111)
Creatinine, Ser: 1.25 mg/dL — ABNORMAL HIGH (ref 0.61–1.24)
GFR, Estimated: >60 mL/min (ref >60)
Glucose, Bld: 122 mg/dL — ABNORMAL HIGH (ref 70–99)
Potassium: 3.7 mmol/L (ref 3.5–5.1)
Sodium: 136 mmol/L (ref 135–145)

## 2022-02-06 MED ORDER — OXYCODONE HCL 5 MG PO TABS: 5.0000 mg | ORAL_TABLET | ORAL | 0 refills | Status: DC | PRN

## 2022-02-06 MED ORDER — DOCUSATE SODIUM 100 MG PO CAPS: 100.0000 mg | ORAL_CAPSULE | Freq: Two times a day (BID) | ORAL | 0 refills | Status: DC

## 2022-02-06 MED ORDER — ONDANSETRON HCL 4 MG PO TABS: 4.0000 mg | ORAL_TABLET | Freq: Four times a day (QID) | ORAL | 0 refills | Status: DC | PRN

## 2022-02-06 NOTE — Discharge Summary (Signed)
Physician Discharge Summary  ?Patient ID: ?Francisco Allen ?MRN: [id number] ?DOB/AGE: [birth year] 61 y.o. ? ?Admit date: 02/02/2022 ?Discharge date: 02/06/2022 ? ?Admission Diagnoses: Colostomy in place  ? ?Discharge Diagnoses:  ?Principal Problem: ?  Colostomy in place [facility]) ?Active Problems: ?  Parastomal hernia without obstruction or gangrene ?  Ventral hernia without obstruction or gangrene ?  History of colostomy reversal ? ? ?Discharged Condition: good ? ?Hospital Course: Francisco Allen is a 61 yo who had a colostomy reversed and parastomal and midline hernia repaired with phasix mesh. He has done well post operatively. He had an NG for a few days due to a SBR and started to have bowel function and was advanced. He has been having BMS and is now on a soft diet. He has his pain controlled with oral meds. He has some minor drainage from his wound, old hematoma, but no signs of infection. This has been minimal and should decrease in the upcoming days. ? ?He is ambulating. He has been on SSI during his stay but will be talk to his Primary Care team regarding his diabetes plan for home later today over the phone. They are aware of his discharge and will be monitoring his BS closely.  ? ?Will monitor his midline closely, he knows to call with any signs of redness or changes that are concerning for infection. Right now it looks like old hematoma draining out. I do not want to remove any staples if not needed given the mesh underlay.  ? ?Consults: None ? ?Significant Diagnostic Studies:  ? Latest Reference Range & Units 02/06/22 03:33  ?Sodium 135 - 145 mmol/L 136  ?Potassium 3.5 - 5.1 mmol/L 3.7  ?Chloride 98 - 111 mmol/L 97 (L)  ?CO2 22 - 32 mmol/L 31  ?Glucose 70 - 99 mg/dL 122 (H)  ?BUN 8 - 23 mg/dL 17  ?Creatinine 0.61 - 1.24 mg/dL 1.25 (H)  ?Calcium 8.9 - 10.3 mg/dL 8.3 (L)  ?Anion gap 5 - 15  8  ?(L): Data is abnormally low ?(H): Data is abnormally high ?Treatments: Colostomy reversal, SBR, Parastomal and midline hernia  repair with phasix mesh 02/02/22  ? ?Discharge Exam: ?Blood pressure (!) 146/71, pulse 86, temperature 97.7 ?F (36.5 ?C), temperature source Oral, resp. rate 18, height '5\' 9"'$  (1.753 m), weight 110.4 kg, SpO2 (!) 89 %. ?General appearance: alert and no distress ?Resp: normal work of breathing ?GI: soft, mildy distended, appropriately tender, staples intact, minor SS drainage on pads inferiorly, bruising of the midline, no erythema no signs of infection ? ? ?Disposition: Discharge disposition: 01-Home or Self Care ? ? ? ? ? ? ?Discharge Instructions   ? ? Call MD for:  difficulty breathing, headache or visual disturbances   Complete by: As directed ?  ? Call MD for:  extreme fatigue   Complete by: As directed ?  ? Call MD for:  persistant dizziness or light-headedness   Complete by: As directed ?  ? Call MD for:  persistant nausea and vomiting   Complete by: As directed ?  ? Call MD for:  redness, tenderness, or signs of infection (pain, swelling, redness, odor or green/yellow discharge around incision site)   Complete by: As directed ?  ? Call MD for:  severe uncontrolled pain   Complete by: As directed ?  ? Call MD for:  temperature >100.4   Complete by: As directed ?  ? Increase activity slowly   Complete by: As directed ?  ? ?  ? ?  Allergies as of 02/06/2022   ? ?   Reactions  ? Bee Venom Anaphylaxis, Swelling, Other (See Comments)  ? Anaphylactic shock  ? Penicillins Anaphylaxis, Swelling  ? Airway closes up ?Has patient had a PCN reaction causing immediate rash, facial/tongue/throat swelling, SOB or lightheadedness with hypotension: Yes ?Has patient had a PCN reaction causing severe rash involving mucus membranes or skin necrosis: Yes ?Has patient had a PCN reaction that required hospitalization: [name] ?Has patient had a PCN reaction occurring within the last 10 years: No ?If all of the above answers are "NO", then may proceed with Cephalosporin use.  ? ?  ? ?  ?Medication List  ?  ? ?TAKE these medications    ? ?ALPRAZolam 1 MG tablet ?Commonly known as: [name] ?Take 1 tablet (1 mg total) by mouth 3 (three) times daily as needed for anxiety. ?  ?carvedilol 6.25 MG tablet ?Commonly known as: COREG ?Take 1 tablet (6.25 mg total) by mouth 2 (two) times daily with a meal in the morning and evening ?  ?dapagliflozin propanediol 5 MG Tabs tablet ?Commonly known as: FARXIGA ?Take 1 tablet (5 mg total) by mouth daily before breakfast. ?  ?docusate sodium 100 MG capsule ?Commonly known as: COLACE ?Take 1 capsule (100 mg total) by mouth 2 (two) times daily. ?  ?Fish Oil 1000 MG Caps ?Take 1,000 mg by mouth daily. ?  ?lisinopril 5 MG tablet ?Commonly known as: ZESTRIL ?Take 1 tablet (5 mg total) by mouth daily. ?  ?MENS 50+ MULTI VITAMIN/MIN PO ?Take 1 tablet by mouth daily. ?  ?ondansetron 4 MG tablet ?Commonly known as: ZOFRAN ?Take 1 tablet (4 mg total) by mouth every 6 (six) hours as needed for nausea. ?  ?oxyCODONE 5 MG immediate release tablet ?Commonly known as: Oxy IR/ROXICODONE ?Take 1-2 tablets (5-10 mg total) by mouth every 4 (four) hours as needed for severe pain or breakthrough pain. ?  ?rosuvastatin 40 MG tablet ?Commonly known as: CRESTOR ?Take 1 tablet (40 mg total) by mouth daily. ?  ?Semaglutide (1 MG/DOSE) 4 MG/3ML Sopn ?Inject 1 mg as directed once a week. ?What changed: additional instructions ?  ?tamsulosin 0.4 MG Caps capsule ?Commonly known as: FLOMAX ?Take 1 capsule (0.4 mg total) by mouth 2 (two) times daily. ?  ?traMADol 50 MG tablet ?Commonly known as: ULTRAM ?Take 50 mg by mouth 2 (two) times daily as needed for moderate pain. ?  ? ?  ? ? Follow-up Information   ? ? [name], MD Follow up on 02/12/2022.   ?Specialty: General Surgery ?Why: staple removal ?Contact information: ?[address] ?[place] [address] ?[phone] ? ? ?  ?  ? ?  ?  ? ?  ? ?Future Appointments  ?Date Time Provider [place]  ?02/12/2022  9:00 AM [name], MD [facility] None  ?03/09/2022 12:55 PM  Dettinger, Fransisca Kaufmann, MD [facility] None  ? ? ?Signed: ?[name] ?02/06/2022, 11:32 AM ? ? ?

## 2022-02-06 NOTE — Plan of Care (Signed)

## 2022-02-06 NOTE — Discharge Instructions (Addendum)
Discharge Open Abdominal Surgery Instructions: ? ?FOLLOW DIABETES PLAN PER JULIE PRUITT RPH; SHE SHOULD BE CALLING YOU.  ? ?Reinforce midline wound with ABD daily and as needed. ?Call if something changes.  ?Monitor for any redness or changes. ? ?Common Complaints: ?Pain at the incision site is common. This will improve with time. Take your pain medications as described below. ?Some nausea is common and poor appetite. The main goal is to stay hydrated the first few days after surgery.  ? ?Diet/ Activity: ?Diet as tolerated. You have started and tolerated a diet in the hospital, and should continue to increase what you are able to eat.   ?You may not have a large appetite, but it is important to stay hydrated. Drink 64 ounces of water a day. Your appetite will return with time.  ?Keep a dry dressing in place over your staples daily or as needed. Some minor pink/ blood tinged drainage is expected. This will stop in a few days after surgery.  ?Shower per your regular routine daily.  Do not take hot showers. Take warm showers that are less than 10 minutes. Path the incision dry. ?Wear an abdominal binder daily with activity. You do not have to wear this while sleeping or sitting.  ?Rest and listen to your body, but do not remain in bed all day.  ?Walk everyday for at least 15-20 minutes. Deep cough and move around every 1-2 hours in the first few days after surgery.  ?Do not lift > 10 lbs, perform excessive bending, pushing, pulling, squatting for 6-8 weeks after surgery.  ?The activity restrictions and the abdominal binder are to prevent hernia formation at your incision while you are healing.  ?Do not place lotions or balms on your incision unless instructed to specifically by Dr. Constance Haw.  ? ?Pain Expectations and Narcotics: ?-After surgery you will have pain associated with your incisions and this is normal. The pain is muscular and nerve pain, and will get better with time. ?-You are encouraged and expected to take  non narcotic medications like tylenol and ibuprofen (when able) to treat pain as multiple modalities can aid with pain treatment. ?-Narcotics are only used when pain is severe or there is breakthrough pain. ?-You are not expected to have a pain score of 0 after surgery, as we cannot prevent pain. A pain score of 3-4 that allows you to be functional, move, walk, and tolerate some activity is the goal. The pain will continue to improve over the days after surgery and is dependent on your surgery. ?-Due to [place] law, we are only able to give a certain amount of pain medication to treat post operative pain, and we only give additional narcotics on a patient by patient basis.  ?-For most laparoscopic surgery, studies have shown that the majority of patients only need 10-15 narcotic pills, and for open surgeries most patients only need 15-20.   ?-Having appropriate expectations of pain and knowledge of pain management with non narcotics is important as we do not want anyone to become addicted to narcotic pain medication.  ?-Using ice packs in the first 48 hours and heating pads after 48 hours, wearing an abdominal binder (when recommended), and using over the counter medications are all ways to help with pain management.   ?-Simple acts like meditation and mindfulness practices after surgery can also help with pain control and research has proven the benefit of these practices. ? ?Medication: ?Take tylenol and ibuprofen as needed for pain control, alternating every  4-6 hours.  ?Example:  ?Tylenol '1000mg'$  @ 6am, 12noon, 6pm, 49mdnight (Do not exceed '4000mg'$  of tylenol a day). Ibuprofen '800mg'$  @ 9am, 3pm, 9pm, 3am (Do not exceed '3600mg'$  of ibuprofen a day).  ?Take Roxicodone for breakthrough pain every 4 hours.  ?Take Colace for constipation related to narcotic pain medication. If you do not have a bowel movement in 2 days, take Miralax over the counter.  ?Drink plenty of water to also prevent constipation.  ? ?Contact  Information: ?If you have questions or concerns, please call our office, [phone] Monday- Thursday 8AM-5PM and Friday 8AM-12Noon.  ?If it is after hours or on the weekend, please call [facility] Main Number, [phone] and ask to speak to the surgeon on call for Dr[facility]   ?

## 2022-02-08 LAB — TYPE AND SCREEN
ABO/RH(D): A POS
Antibody Screen: POSITIVE
Unit division: 0
Unit division: 0

## 2022-02-08 LAB — BPAM RBC
Blood Product Expiration Date: 202304012359
ISSUE DATE / TIME: 202303311143
Unit Type and Rh: 6200

## 2022-02-09 ENCOUNTER — Other Ambulatory Visit (HOSPITAL_COMMUNITY): Payer: Self-pay

## 2022-02-10 ENCOUNTER — Other Ambulatory Visit (HOSPITAL_COMMUNITY): Payer: Self-pay

## 2022-02-12 ENCOUNTER — Encounter: Payer: No Typology Code available for payment source | Admitting: General Surgery

## 2022-02-12 ENCOUNTER — Encounter: Payer: Self-pay | Admitting: General Surgery

## 2022-02-12 ENCOUNTER — Ambulatory Visit (INDEPENDENT_AMBULATORY_CARE_PROVIDER_SITE_OTHER): Payer: No Typology Code available for payment source | Admitting: General Surgery

## 2022-02-12 VITALS — BP 117/75 | HR 76 | Temp 98.3°F | Resp 18 | Ht 69.0 in | Wt 237.0 lb

## 2022-02-12 DIAGNOSIS — T148XXA Other injury of unspecified body region, initial encounter: Secondary | ICD-10-CM

## 2022-02-12 DIAGNOSIS — Z933 Colostomy status: Secondary | ICD-10-CM

## 2022-02-12 MED ORDER — OXYCODONE HCL 5 MG PO TABS: 5.0000 mg | ORAL_TABLET | ORAL | 0 refills | Status: DC | PRN

## 2022-02-12 NOTE — Patient Instructions (Signed)
Pack wound daily with gauze and ABD paper tape. ?Once it is drier then pack with saline dampened gauze. ?Ok to remove the other two staples up top if pulling through ?Diet as tolerated. ?No heavy lifting > 10 lbs, excessive bending, pushing, pulling, or squatting for 6-8 weeks after surgery.  ? ? ?

## 2022-02-12 NOTE — Progress Notes (Signed)
[facility] ? ?Having drainage from the inferior incision still, serous/ serosanginous in nature.  Sore but otherwise eating and having Bms. ? ?BP 117/75   Pulse 76   Temp 98.3 ?F (36.8 ?C) (Oral)   Resp 18   Ht '5\' 9"'$  (1.753 m)   Wt 237 lb (107.5 kg)   SpO2 91%   BMI 35.00 kg/m?  ?Colostomy site healed, staples removed, steri strips placed ?Midline upper portion healed, leaking from mid lower, staple removed and probed with q tip, hematoma evacuated, packed with dry gauze, removed upper staples and left lower staples in place, steri strips to upper part of incision ?No erythema on either, bruising on the inferior midline improving  ? ?Patient s/p colostomy reversal and hernia repair with phasix mesh. Hematoma at midline evacuated. Packing started. Patient has packed wound previously. ? ?ack wound daily with gauze and ABD paper tape. ?Once it is drier then pack with saline dampened gauze. ?Ok to remove the other two staples up top if pulling through ?Diet as tolerated. ?No heavy lifting > 10 lbs, excessive bending, pushing, pulling, or squatting for 6-8 weeks after surgery.  ?Roxicodone 5-10 mg q 4 PRN # 30 refilled  ? ? ?Will see Dr. Okey Dupre next week to check hematoma site and removed remaining staples if ready.  ? ?Future Appointments  ?Date Time Provider [place]  ?02/19/2022 10:00 AM Pappayliou, Barnetta Chapel A, DO [facility] None  ?03/09/2022 12:55 PM Dettinger, Fransisca Kaufmann, MD [facility] None  ?03/10/2022 10:15 AM [name], MD [facility] None  ? ? ?Curlene Labrum, MD ?[facility] ?[place], [place] [address] ?[phone] (office) ? ? ?

## 2022-02-18 ENCOUNTER — Telehealth: Payer: Self-pay

## 2022-02-18 NOTE — Chronic Care Management (AMB) (Signed)
?  Care Management  ? ?Outreach Note ? ?02/18/2022 ?Name: Francisco Allen MRN: [id number] DOB: [birth year] ? ?Referred by: Dettinger, Fransisca Kaufmann, MD ?Reason for referral : Care Coordination (Outreach to schedule initial with [facility] ) ? ? ?An unsuccessful telephone outreach was attempted today. The patient was referred to the case management team for assistance with care management and care coordination.  ? ?Follow Up Plan:  ?A HIPAA compliant phone message was left for the patient providing contact information and requesting a return call.  ?The care management team will reach out to the patient again over the next 7 days.  ?If patient returns call to provider office, please advise to call [place] * at [phone]* ? ?Noreene Larsson, RMA ?Care Guide, Embedded Care Coordination ?[facility]  [facility]  ?[place], [place] [address] ?Direct Dial: [phone] ?[email or web address] ?[email or web address]: [email or web address]  ? ?

## 2022-02-19 ENCOUNTER — Encounter: Payer: Self-pay | Admitting: Surgery

## 2022-02-19 ENCOUNTER — Other Ambulatory Visit: Payer: Self-pay

## 2022-02-19 ENCOUNTER — Ambulatory Visit (INDEPENDENT_AMBULATORY_CARE_PROVIDER_SITE_OTHER): Payer: No Typology Code available for payment source | Admitting: Surgery

## 2022-02-19 VITALS — BP 106/72 | HR 82 | Temp 97.3°F | Resp 16 | Ht 69.0 in | Wt 230.0 lb

## 2022-02-19 DIAGNOSIS — T8130XA Disruption of wound, unspecified, initial encounter: Secondary | ICD-10-CM

## 2022-02-19 DIAGNOSIS — Z09 Encounter for follow-up examination after completed treatment for conditions other than malignant neoplasm: Secondary | ICD-10-CM

## 2022-02-19 MED ORDER — OXYCODONE HCL 5 MG PO TABS: 5.0000 mg | ORAL_TABLET | Freq: Four times a day (QID) | ORAL | 0 refills | Status: DC | PRN

## 2022-02-19 NOTE — Progress Notes (Signed)
[facility] Note  ? ?HPI:  ?61 y.o. Male presents to clinic for post-op follow-up test post colostomy reversal with parastomal hernia repair and ventral hernia repair on 3/27.  Patient states that he has been doing overall well.  He has had his niece performed packing changes to his open lower midline wound.  He describes the drainage as a clearish bloody drainage.  He denies fevers and chills.  He is tolerating a diet without nausea and vomiting.  He is moving his bowels without issue.  He is having some continued abdominal pain at the locations of his 2 incision sites. ? ?Review of Systems:  ?All other review of systems: otherwise negative  ? ?Vital Signs:  ?BP 106/72   Pulse 82   Temp (!) 97.3 ?F (36.3 ?C) (Oral)   Resp 16   Ht '5\' 9"'$  (1.753 m)   Wt 230 lb (104.3 kg)   SpO2 94%   BMI 33.97 kg/m?   ? ?Physical Exam:  ?Physical Exam ?Vitals reviewed.  ?Constitutional:   ?   Appearance: Normal appearance.  ?Abdominal:  ?   Comments: Abdomen soft, nondistended, no percussion tenderness, minimal tenderness to palpation, left ostomy incision healing well, midline incision healing well with opening at inferior aspect of the incision, granulation tissue at the base, small (less than 1 cm) opening just superior to the inferior opening  ?Neurological:  ?   Mental Status: He is alert.  ? ? ?Laboratory studies: None ? ?Imaging:  ?None ? ?Assessment:  ?61 y.o. yo Male who presents for follow-up status post colostomy reversal with ventral hernia repair and parastomal hernia repair on 3/27 ? ?Plan:  ?-The remainder of the patient's staples were removed ?-The midline incision opening appears healthy without evidence of infection.  Packing was changed ?-Advised the patient to continue with his daily packing changes ?-Further advised the patient that he does not need to pack the small opening, as this will likely close on its own, and is communicating with the larger opening below ?-Prescription for  Roxicodone provided to the patient, advised him that I change the frequency to every 6 hours as needed for pain ?-Follow up with Dr. Constance Haw on 5/2 ? ?All of the above recommendations were discussed with the patient, and all of patient's questions were answered to his expressed satisfaction. ? ?Graciella Freer, DO ?[facility] ?[place], [place] [address] ?[phone] (office) ?

## 2022-02-23 ENCOUNTER — Other Ambulatory Visit: Payer: Self-pay | Admitting: Family Medicine

## 2022-02-23 ENCOUNTER — Other Ambulatory Visit (HOSPITAL_COMMUNITY): Payer: Self-pay

## 2022-02-23 DIAGNOSIS — E785 Hyperlipidemia, unspecified: Secondary | ICD-10-CM

## 2022-02-23 DIAGNOSIS — E782 Mixed hyperlipidemia: Secondary | ICD-10-CM

## 2022-02-23 MED ORDER — ROSUVASTATIN CALCIUM 40 MG PO TABS
40.0000 mg | ORAL_TABLET | Freq: Every day | ORAL | 0 refills | Status: DC
  Filled 2022-02-23: qty 90, 90d supply, fill #0

## 2022-02-24 ENCOUNTER — Other Ambulatory Visit (HOSPITAL_COMMUNITY): Payer: Self-pay

## 2022-02-26 NOTE — Chronic Care Management (AMB) (Signed)
?  Chronic Care Management  ? ?Note ? ?02/26/2022 ?Name: Francisco Allen MRN: [id number] DOB: February 10, 1961 ? ?Francisco Allen is a 61 y.o. year old male who is a primary care patient of Dettinger, Fransisca Kaufmann, MD. Francisco Allen is currently enrolled in care management services. An additional referral for RNCM  was placed.  ? ?Follow up plan: ?Telephone appointment with care management team member scheduled for:03/02/2022 ? ?Noreene Larsson, RMA ?Care Guide, Embedded Care Coordination ?[facility]  [facility] Management  ?[place], [place] [address] ?Direct Dial: [phone] ?Museum/gallery conservator.[email or web address] ?Website: [email or web address]  ? ?

## 2022-03-02 ENCOUNTER — Encounter: Payer: Self-pay | Admitting: *Deleted

## 2022-03-02 ENCOUNTER — Ambulatory Visit: Payer: No Typology Code available for payment source | Admitting: *Deleted

## 2022-03-02 DIAGNOSIS — E1129 Type 2 diabetes mellitus with other diabetic kidney complication: Secondary | ICD-10-CM

## 2022-03-03 NOTE — Patient Instructions (Signed)
Visit Information ? ?Patient Goals/Self-Care Activities: ?Take medications as prescribed   ?Perform all self care activities independently  ?Perform IADL's (shopping, preparing meals, housekeeping, managing finances) independently ?Call provider office for new concerns or questions  ?take the blood sugar meter to all doctor visits ?  ? ?Patient verbalizes understanding of instructions and care plan provided today and agrees to view in [place]. Active MyChart status confirmed with patient.   ? ?Telephone follow up appointment with care management team member scheduled for: 03/16/22 with RNCM ?The patient has been provided with contact information for the care management team and has been advised to call with any health related questions or concerns.  ? ?Chong Sicilian, BSN, RN-BC ?Embedded Chronic Care Manager ?[place] [facility] [place] [facility] ?Direct Dial: [phone]   ?

## 2022-03-03 NOTE — Chronic Care Management (AMB) (Signed)
? Care Management ?  ? RN Visit Note ? ?03/02/2022 ?Name: Francisco Allen MRN: [id number] DOB: January 16, 1961 ? ?Subjective: ?Francisco Allen is a 61 y.o. year old male who is a primary care patient of Dettinger, Fransisca Kaufmann, MD. The care management team was consulted for assistance with disease management and care coordination needs.   ? ?Engaged with patient by telephone for  Initial RN Visit  in response to provider referral for case management and/or care coordination services.  ? ?Assessment: Review of patient past medical history, allergies, medications, health status, including review of consultants reports, laboratory and other test data, was performed as part of comprehensive evaluation and provision of chronic care management services.  ? ?SDOH (Social Determinants of Health) assessments and interventions performed:  ?SDOH Interventions   ? ?Flowsheet Row Most Recent Value  ?SDOH Interventions   ?Food Insecurity Interventions Intervention Not Indicated  ?Financial Strain Interventions Intervention Not Indicated  ?Housing Interventions Intervention Not Indicated  ?Transportation Interventions Intervention Not Indicated  ? ?  ?  ? ?Care Plan ? ?Allergies  ?Allergen Reactions  ? Bee Venom Anaphylaxis, Swelling and Other (See Comments)  ?  Anaphylactic shock  ? Penicillins Anaphylaxis and Swelling  ?  Airway closes up ?Has patient had a PCN reaction causing immediate rash, facial/tongue/throat swelling, SOB or lightheadedness with hypotension: Yes ?Has patient had a PCN reaction causing severe rash involving mucus membranes or skin necrosis: Yes ?Has patient had a PCN reaction that required hospitalization: [name] ?Has patient had a PCN reaction occurring within the last 10 years: No ?If all of the above answers are "NO", then may proceed with Cephalosporin use. ?  ? ? ?Outpatient Encounter Medications as of 03/02/2022  ?Medication Sig Note  ? ALPRAZolam (XANAX) 1 MG tablet Take 1 tablet (1 mg total) by mouth 3 (three)  times daily as needed for anxiety.   ? carvedilol (COREG) 6.25 MG tablet Take 1 tablet (6.25 mg total) by mouth 2 (two) times daily with a meal in the morning and evening   ? dapagliflozin propanediol (FARXIGA) 5 MG TABS tablet Take 1 tablet (5 mg total) by mouth daily before breakfast. 12/16/2021: Samples ?  ? docusate sodium (COLACE) 100 MG capsule Take 1 capsule (100 mg total) by mouth 2 (two) times daily.   ? lisinopril (ZESTRIL) 5 MG tablet Take 1 tablet (5 mg total) by mouth daily. (Patient taking differently: Take 2.5 mg by mouth daily.)   ? Multiple Vitamins-Minerals (MENS 50+ MULTI VITAMIN/MIN PO) Take 1 tablet by mouth daily.   ? Omega-3 Fatty Acids (FISH OIL) 1000 MG CAPS Take 1,000 mg by mouth daily.   ? ondansetron (ZOFRAN) 4 MG tablet Take 1 tablet (4 mg total) by mouth every 6 (six) hours as needed for nausea.   ? oxyCODONE (OXY IR/ROXICODONE) 5 MG immediate release tablet Take 1 tablet (5 mg total) by mouth every 6 (six) hours as needed for severe pain or breakthrough pain.   ? rosuvastatin (CRESTOR) 40 MG tablet Take 1 tablet (40 mg total) by mouth daily.   ? Semaglutide, 1 MG/DOSE, 4 MG/3ML SOPN Inject 1 mg as directed once a week. (Patient taking differently: Inject 1 mg as directed once a week. Wednesday)   ? tamsulosin (FLOMAX) 0.4 MG CAPS capsule Take 1 capsule (0.4 mg total) by mouth 2 (two) times daily.   ? traMADol (ULTRAM) 50 MG tablet Take 50 mg by mouth 2 (two) times daily as needed for moderate pain.   ? ?No  facility-administered encounter medications on file as of 03/02/2022.  ? ? ?Patient Active Problem List  ? Diagnosis Date Noted  ? Hematoma 02/12/2022  ? History of colostomy reversal 02/02/2022  ? Parastomal hernia without obstruction or gangrene   ? Ventral hernia without obstruction or gangrene   ? CKD stage 3 due to type 2 diabetes mellitus ([place]) 10/15/2021  ? Colostomy in place [facility]) 05/13/2021  ? HTN (hypertension) 09/23/2017  ? Tobacco use 09/23/2017  ? Type 2 diabetes mellitus  with microalbuminuria ([place]) 02/09/2017  ? Diverticulitis of colon 10/21/2015  ? Morbid obesity ([place]) 05/14/2015  ? Migraines 04/09/2014  ? GAD (generalized anxiety disorder) 04/04/2013  ? Hyperlipidemia 04/04/2013  ? ? ?Conditions to be addressed/monitored: DMII and Osteoarthritis ? ?Care Plan : [facility] Care Plan  ?Updates made by Ilean China, RN since 03/03/2022 12:00 AM  ?  ? ?Problem: Chronic Disease Managemet Needs   ?Priority: High  ?Onset Date: 03/02/2022  ?  ? ?Long-Range Goal: Patient will Work with Consulting civil engineer Regarding Care Management and [place] with diabetes HTN, DM, CKD 3, Arthritis, Hx of colostomy reversal   ?Start Date: 03/02/2022  ?Expected End Date: 03/03/2023  ?This Visit's Progress: On track  ?Priority: High  ?Note:   ?Current Barriers:  ?Chronic Disease Management support and education needs related to HTN, DM, CKD 3, Arthritis, Hx of colostomy reversal ? ?RNCM Clinical Goal(s):  ?Patient will continue to work with RN Care Manager and/or Social Worker to address care management and care coordination needs related to HTN, DM, CKD 3, Arthritis, Hx of colostomy reversal as evidenced by adherence to CM Team Scheduled appointments     through collaboration with RN Care manager, provider, and care team.  ? ?Interventions: ?1:1 collaboration with primary care provider regarding development and update of comprehensive plan of care as evidenced by provider attestation and co-signature ?Inter-disciplinary care team collaboration (see longitudinal plan of care) ?Evaluation of current treatment plan related to  self management and patient's adherence to plan as established by provider ?Discussed recent colostomy and reversal and follow-up appt with surgeon ? ? ?Diabetes:  (Status: New goal.) Long Term Goal  ?Lab Results  ?Component Value Date  ? HGBA1C 6.7 (H) 01/29/2022  ? HGBA1C 6.9 (H) 01/06/2022  ? HGBA1C 7.8 (H) 12/03/2021  ? ?Lab Results  ?Component Value Date  ? [place] 47  07/31/2021  ? CREATININE 1.25 (H) 02/06/2022  ?Assessed patient's understanding of A1c goal: <7% ?Provided education to patient about basic DM disease process; ?Reviewed medications with patient and discussed importance of medication adherence;        ?Counseled on importance of regular laboratory monitoring as prescribed;        ?Discussed plans with patient for ongoing care management follow up and provided patient with direct contact information for care management team;      ?call provider for findings outside established parameters;       ?Review of patient status, including review of consultants reports, relevant laboratory and other test results, and medications completed;       ?Discussed use of CGM for blood sugar management ?Collaborated with PharmD ? ? ?Arthritis (Knee pain):  (Status: New goal.) Long Term Goal  ?Discussed knee pain ?Assessed mobility and ability to perform ADLs ?Discussed family/social support ?Discussed use of knee brace for pain relief and support ?Discussed pain management strategies. Has used some "patches" that have helped. Would like a prescription.  ?Message sent to PCP regarding desire for prescription ?Reviewed  upcoming appointments with PCP ?Encouraged to reach out to PCP as needed ?Encouraged to reach out to [facility] as needed [phone] ? ? ?Patient Goals/Self-Care Activities: ?Take medications as prescribed   ?Perform all self care activities independently  ?Perform IADL's (shopping, preparing meals, housekeeping, managing finances) independently ?Call provider office for new concerns or questions  ?take the blood sugar meter to all doctor visits ? ?Plan: Telephone follow up appointment with care management team member scheduled for:  03/16/22 with RNCM ?The patient has been provided with contact information for the care management team and has been advised to call with any health related questions or concerns.  ? ? ?Chong Sicilian, BSN, RN-BC ?Embedded Chronic Care  Manager ?[place] / [place] Management ?Direct Dial: [phone] ?  ? ? ? ? ? ? ? ? ? ? ? ? ? ?

## 2022-03-09 ENCOUNTER — Telehealth: Payer: Self-pay

## 2022-03-09 ENCOUNTER — Ambulatory Visit (INDEPENDENT_AMBULATORY_CARE_PROVIDER_SITE_OTHER): Payer: No Typology Code available for payment source | Admitting: Family Medicine

## 2022-03-09 ENCOUNTER — Other Ambulatory Visit (HOSPITAL_COMMUNITY): Payer: Self-pay

## 2022-03-09 ENCOUNTER — Encounter: Payer: Self-pay | Admitting: Family Medicine

## 2022-03-09 VITALS — BP 123/82 | HR 75 | Ht 69.0 in | Wt 222.0 lb

## 2022-03-09 DIAGNOSIS — E1122 Type 2 diabetes mellitus with diabetic chronic kidney disease: Secondary | ICD-10-CM

## 2022-03-09 DIAGNOSIS — N183 Chronic kidney disease, stage 3 unspecified: Secondary | ICD-10-CM

## 2022-03-09 DIAGNOSIS — I10 Essential (primary) hypertension: Secondary | ICD-10-CM

## 2022-03-09 DIAGNOSIS — E1129 Type 2 diabetes mellitus with other diabetic kidney complication: Secondary | ICD-10-CM | POA: Diagnosis not present

## 2022-03-09 DIAGNOSIS — F411 Generalized anxiety disorder: Secondary | ICD-10-CM

## 2022-03-09 DIAGNOSIS — E782 Mixed hyperlipidemia: Secondary | ICD-10-CM | POA: Diagnosis not present

## 2022-03-09 DIAGNOSIS — Z79899 Other long term (current) drug therapy: Secondary | ICD-10-CM

## 2022-03-09 DIAGNOSIS — R809 Proteinuria, unspecified: Secondary | ICD-10-CM

## 2022-03-09 DIAGNOSIS — M1712 Unilateral primary osteoarthritis, left knee: Secondary | ICD-10-CM

## 2022-03-09 DIAGNOSIS — E785 Hyperlipidemia, unspecified: Secondary | ICD-10-CM

## 2022-03-09 MED ORDER — ALPRAZOLAM 1 MG PO TABS
1.0000 mg | ORAL_TABLET | Freq: Three times a day (TID) | ORAL | 2 refills | Status: DC | PRN
  Filled 2022-03-09: qty 90, 30d supply, fill #0
  Filled 2022-03-13: qty 61, 20d supply, fill #0
  Filled 2022-03-13: qty 29, 10d supply, fill #0
  Filled 2022-04-27: qty 90, 30d supply, fill #1
  Filled 2022-06-08: qty 90, 30d supply, fill #2

## 2022-03-09 MED ORDER — SEMAGLUTIDE (1 MG/DOSE) 4 MG/3ML ~~LOC~~ SOPN
1.0000 mg | PEN_INJECTOR | SUBCUTANEOUS | 3 refills | Status: DC
  Filled 2022-03-09: qty 3, 28d supply, fill #0
  Filled 2022-05-12: qty 3, 28d supply, fill #1
  Filled 2022-05-27: qty 3, 28d supply, fill #2
  Filled 2022-07-22: qty 3, 28d supply, fill #3
  Filled 2022-08-13 – 2022-08-19 (×2): qty 3, 28d supply, fill #4
  Filled 2022-09-15: qty 3, 28d supply, fill #5
  Filled 2022-10-09: qty 3, 28d supply, fill #6
  Filled 2022-11-05: qty 3, 28d supply, fill #7
  Filled 2022-12-02: qty 3, 28d supply, fill #8

## 2022-03-09 MED ORDER — DICLOFENAC SODIUM 1 % EX GEL
2.0000 g | Freq: Four times a day (QID) | CUTANEOUS | 3 refills | Status: DC
  Filled 2022-03-09: qty 400, 30d supply, fill #0
  Filled 2022-06-09 – 2022-06-25 (×3): qty 400, 30d supply, fill #1
  Filled 2022-08-13: qty 400, 30d supply, fill #2
  Filled 2022-09-07: qty 200, 25d supply, fill #3

## 2022-03-09 MED ORDER — ROSUVASTATIN CALCIUM 40 MG PO TABS
40.0000 mg | ORAL_TABLET | Freq: Every day | ORAL | 3 refills | Status: DC
  Filled 2022-03-09 – 2022-05-26 (×2): qty 90, 90d supply, fill #0
  Filled 2022-08-19: qty 90, 90d supply, fill #1
  Filled 2022-11-17: qty 90, 90d supply, fill #2

## 2022-03-09 NOTE — Chronic Care Management (AMB) (Signed)
?  Care Management  ? ?Note ? ?03/09/2022 ?Name: Francisco Allen MRN: [id number] DOB: Apr 16, 1961 ? ?Francisco Allen is a 61 y.o. year old male who is a primary care patient of Dettinger, Fransisca Kaufmann, MD and is actively engaged with the care management team. I reached out to [name] by phone today to assist with re-scheduling a follow up visit with the RN Case Manager ? ?Follow up plan: ?Unsuccessful telephone outreach attempt made. A HIPAA compliant phone message was left for the patient providing contact information and requesting a return call.  ?The care management team will reach out to the patient again over the next 7 days.  ?If patient returns call to provider office, please advise to call [place]  at [phone] ? ?Noreene Larsson, RMA ?Care Guide, Embedded Care Coordination ?[place]  Care Management  ?[place], [place] [address] ?Direct Dial: [phone] ?Museum/gallery conservator.[email or web address] ?Website: [email or web address]  ? ?

## 2022-03-09 NOTE — Progress Notes (Signed)
? ?BP 123/82   Pulse 75   Ht 5' 9" (1.753 m)   Wt 222 lb (100.7 kg)   SpO2 98%   BMI 32.78 kg/m?   ? ?Subjective:  ? ?Patient ID: Francisco Allen, male    DOB: 01/09/1961, 61 y.o.   MRN: [id number] ? ?HPI: ?Francisco Allen is a 61 y.o. male presenting on 03/09/2022 for Medical Management of Chronic Issues, Diabetes, Hypertension, and Hyperlipidemia ? ? ?HPI ?Type 2 diabetes mellitus ?Patient comes in today for recheck of his diabetes. Patient has been currently taking ozempic, was on Farxiga but not currently.. Patient is currently on an ACE inhibitor/ARB. Patient has not seen an ophthalmologist this year. Patient denies any issues with their feet. The symptom started onset as an adult hypertension and hyperlipidemia ARE RELATED TO DM  ? ?Hyperlipidemia ?Patient is coming in for recheck of his hyperlipidemia. The patient is currently taking Crestor. They deny any issues with myalgias or history of liver damage from it. They deny any focal numbness or weakness or chest pain.  ? ?Hypertension ?Patient is currently on carvedilol and lisinopril, and their blood pressure today is 123/82. Patient denies any lightheadedness or dizziness. Patient denies headaches, blurred vision, chest pains, shortness of breath, or weakness. Denies any side effects from medication and is content with current medication.  ? ?Patient is recovering and healing from abdominal surgery.  He still sees his Psychologist, sport and exercise and is doing wound care. ? ?Anxiety ?Current rx-Xanax 1 mg 3 times daily as needed ?# meds rx-90 ?Effectiveness of current meds-works well ?Adverse reactions form meds-none ? ?Pill count performed-No ?Last drug screen -05/01/2021 ?( high risk q48m moderate risk q649mlow risk yearly ) ?Urine drug screen today- Yes ?Was the [place]-yes ? If yes were their any concerning findings? -Had recent surgery and is getting oxycodone from stress the importance of not taking the 2 together ? ?No flowsheet data found. ? ? ?Controlled  substance contract signed on: 05/02/2019 ? ?Relevant past medical, surgical, family and social history reviewed and updated as indicated. Interim medical history since our last visit reviewed. ?Allergies and medications reviewed and updated. ? ?Review of Systems  ?Constitutional:  Negative for chills and fever.  ?Eyes:  Negative for visual disturbance.  ?Respiratory:  Negative for shortness of breath and wheezing.   ?Cardiovascular:  Negative for chest pain and leg swelling.  ?Musculoskeletal:  Positive for arthralgias (Continued left knee osteoarthritis and pain, uses his wife Voltaren and wants his own prescription). Negative for back pain and gait problem.  ?Skin:  Negative for rash.  ?Neurological:  Negative for dizziness, weakness and light-headedness.  ?All other systems reviewed and are negative. ? ?Per HPI unless specifically indicated above ? ? ?Allergies as of 03/09/2022   ? ?   Reactions  ? Bee Venom Anaphylaxis, Swelling, Other (See Comments)  ? Anaphylactic shock  ? Penicillins Anaphylaxis, Swelling  ? Airway closes up ?Has patient had a PCN reaction causing immediate rash, facial/tongue/throat swelling, SOB or lightheadedness with hypotension: Yes ?Has patient had a PCN reaction causing severe rash involving mucus membranes or skin necrosis: Yes ?Has patient had a PCN reaction that required hospitalization: YeAlfred LevinsHas patient had a PCN reaction occurring within the last 10 years: No ?If all of the above answers are "NO", then may proceed with Cephalosporin use.  ? ?  ? ?  ?Medication List  ?  ? ?  ? Accurate as of Mar 09, 2022  1:36 PM. If you have any  questions, ask your nurse or doctor.  ?  ?  ? ?  ? ?STOP taking these medications   ? ?traMADol 50 MG tablet ?Commonly known as: ULTRAM ?Stopped by: Joshua A Dettinger, MD ?  ? ?  ? ?TAKE these medications   ? ?ALPRAZolam 1 MG tablet ?Commonly known as: XANAX ?Take 1 tablet (1 mg total) by mouth 3 (three) times daily as needed for anxiety. ?  ?carvedilol 6.25  MG tablet ?Commonly known as: COREG ?Take 1 tablet (6.25 mg total) by mouth 2 (two) times daily with a meal in the morning and evening ?  ?dapagliflozin propanediol 5 MG Tabs tablet ?Commonly known as: FARXIGA ?Take 1 tablet (5 mg total) by mouth daily before breakfast. ?  ?diclofenac Sodium 1 % Gel ?Commonly known as: Voltaren ?Apply 2 g topically 4 (four) times daily. ?Started by: Joshua A Dettinger, MD ?  ?docusate sodium 100 MG capsule ?Commonly known as: COLACE ?Take 1 capsule (100 mg total) by mouth 2 (two) times daily. ?  ?Fish Oil 1000 MG Caps ?Take 1,000 mg by mouth daily. ?  ?lisinopril 2.5 MG tablet ?Commonly known as: ZESTRIL ?Take 2.5 mg by mouth daily. ?What changed: Another medication with the same name was removed. Continue taking this medication, and follow the directions you see here. ?Changed by: Joshua A Dettinger, MD ?  ?MENS 50+ MULTI VITAMIN/MIN PO ?Take 1 tablet by mouth daily. ?  ?ondansetron 4 MG tablet ?Commonly known as: ZOFRAN ?Take 1 tablet (4 mg total) by mouth every 6 (six) hours as needed for nausea. ?  ?oxyCODONE 5 MG immediate release tablet ?Commonly known as: Oxy IR/ROXICODONE ?Take 1 tablet (5 mg total) by mouth every 6 (six) hours as needed for severe pain or breakthrough pain. ?  ?rosuvastatin 40 MG tablet ?Commonly known as: CRESTOR ?Take 1 tablet (40 mg total) by mouth daily. ?  ?Semaglutide (1 MG/DOSE) 4 MG/3ML Sopn ?Inject 1 mg as directed once a week. ?What changed: additional instructions ?  ?tamsulosin 0.4 MG Caps capsule ?Commonly known as: FLOMAX ?Take 1 capsule (0.4 mg total) by mouth 2 (two) times daily. ?  ? ?  ? ? ? ?Objective:  ? ?BP 123/82   Pulse 75   Ht 5' 9" (1.753 m)   Wt 222 lb (100.7 kg)   SpO2 98%   BMI 32.78 kg/m?   ?Wt Readings from Last 3 Encounters:  ?03/09/22 222 lb (100.7 kg)  ?02/19/22 230 lb (104.3 kg)  ?02/12/22 237 lb (107.5 kg)  ?  ?Physical Exam ?Vitals and nursing note reviewed.  ?Constitutional:   ?   General: He is not in acute  distress. ?   Appearance: He is well-developed. He is not diaphoretic.  ?Eyes:  ?   General: No scleral icterus. ?   Conjunctiva/sclera: Conjunctivae normal.  ?Neck:  ?   Thyroid: No thyromegaly.  ?Cardiovascular:  ?   Rate and Rhythm: Normal rate and regular rhythm.  ?   Heart sounds: Normal heart sounds. No murmur heard. ?Pulmonary:  ?   Effort: Pulmonary effort is normal. No respiratory distress.  ?   Breath sounds: Normal breath sounds. No wheezing.  ?Musculoskeletal:     ?   General: No swelling. Normal range of motion.  ?   Cervical back: Neck supple.  ?   Left knee: Effusion and crepitus present. Normal range of motion. Tenderness present over the medial joint line.  ?Lymphadenopathy:  ?   Cervical: No cervical adenopathy.  ?Skin: ?     General: Skin is warm and dry.  ?   Findings: Wound (Abdominal wound dehiscence, packed and covered, healing well per patient, sees surgeon) present. No rash.  ?Neurological:  ?   Mental Status: He is alert and oriented to person, place, and time.  ?   Coordination: Coordination normal.  ?Psychiatric:     ?   Behavior: Behavior normal.  ? ? ? ? ?Assessment & Plan:  ? ?Problem List Items Addressed This Visit   ? ?  ? Cardiovascular and Mediastinum  ? HTN (hypertension)  ? Relevant Medications  ? rosuvastatin (CRESTOR) 40 MG tablet  ? lisinopril (ZESTRIL) 2.5 MG tablet  ?  ? Endocrine  ? Type 2 diabetes mellitus with microalbuminuria (HCC)  ? Relevant Medications  ? Semaglutide, 1 MG/DOSE, 4 MG/3ML SOPN  ? rosuvastatin (CRESTOR) 40 MG tablet  ? lisinopril (ZESTRIL) 2.5 MG tablet  ? Other Relevant Orders  ? CBC with Differential/Platelet  ? CMP14+EGFR  ? CKD stage 3 due to type 2 diabetes mellitus ([place])  ? Relevant Medications  ? Semaglutide, 1 MG/DOSE, 4 MG/3ML SOPN  ? rosuvastatin (CRESTOR) 40 MG tablet  ? lisinopril (ZESTRIL) 2.5 MG tablet  ? Other Relevant Orders  ? CMP14+EGFR  ?  ? Other  ? GAD (generalized anxiety disorder)  ? Relevant Medications  ? Semaglutide, 1 MG/DOSE, 4  MG/3ML SOPN  ? ALPRAZolam (XANAX) 1 MG tablet  ? Hyperlipidemia  ? Relevant Medications  ? Semaglutide, 1 MG/DOSE, 4 MG/3ML SOPN  ? rosuvastatin (CRESTOR) 40 MG tablet  ? lisinopril (ZESTRIL) 2.5 MG tablet  ? Other Relevant O

## 2022-03-09 NOTE — Chronic Care Management (AMB) (Signed)
?  Care Management  ? ?Note ? ?03/09/2022 ?Name: TYLEN LEVERICH MRN: [id number] DOB: 05/18/1961 ? ?DEVERE BREM is a 61 y.o. year old male who is a primary care patient of Dettinger, Fransisca Kaufmann, MD and is actively engaged with the care management team. I reached out to [name] by phone today to assist with re-scheduling a follow up visit with the RN Case Manager ? ?Follow up plan: ?Telephone appointment with care management team member scheduled for:03/17/2022 ? ?Noreene Larsson, RMA ?Care Guide, Embedded Care Coordination ?[place]  Care Management  ?[place], [place] [address] ?Direct Dial: [phone] ?Museum/gallery conservator.[email or web address] ?Website: [email or web address]  ? ?

## 2022-03-10 ENCOUNTER — Telehealth: Payer: Self-pay

## 2022-03-10 ENCOUNTER — Other Ambulatory Visit (HOSPITAL_COMMUNITY): Payer: Self-pay

## 2022-03-10 ENCOUNTER — Encounter: Payer: Self-pay | Admitting: General Surgery

## 2022-03-10 ENCOUNTER — Ambulatory Visit (INDEPENDENT_AMBULATORY_CARE_PROVIDER_SITE_OTHER): Payer: No Typology Code available for payment source | Admitting: General Surgery

## 2022-03-10 VITALS — BP 128/73 | HR 71 | Temp 98.2°F | Resp 16 | Ht 69.0 in | Wt 223.0 lb

## 2022-03-10 DIAGNOSIS — T8130XA Disruption of wound, unspecified, initial encounter: Secondary | ICD-10-CM

## 2022-03-10 LAB — CBC WITH DIFFERENTIAL/PLATELET
Basophils Absolute: 0.1 10*3/uL (ref 0.0–0.2)
Basos: 1 %
EOS (ABSOLUTE): 0.2 10*3/uL (ref 0.0–0.4)
Eos: 2 %
Hematocrit: 43.1 % (ref 37.5–51.0)
Hemoglobin: 14.8 g/dL (ref 13.0–17.7)
Immature Grans (Abs): 0 10*3/uL (ref 0.0–0.1)
Immature Granulocytes: 0 %
Lymphocytes Absolute: 1.8 10*3/uL (ref 0.7–3.1)
Lymphs: 18 %
MCH: 28.7 pg (ref 26.6–33.0)
MCHC: 34.3 g/dL (ref 31.5–35.7)
MCV: 84 fL (ref 79–97)
Monocytes Absolute: 0.8 10*3/uL (ref 0.1–0.9)
Monocytes: 8 %
Neutrophils Absolute: 7.2 10*3/uL — ABNORMAL HIGH (ref 1.4–7.0)
Neutrophils: 71 %
Platelets: 211 10*3/uL (ref 150–450)
RBC: 5.16 x10E6/uL (ref 4.14–5.80)
RDW: 13.8 % (ref 11.6–15.4)
WBC: 10.1 10*3/uL (ref 3.4–10.8)

## 2022-03-10 LAB — CMP14+EGFR
ALT: 22 IU/L (ref 0–44)
AST: 22 IU/L (ref 0–40)
Albumin/Globulin Ratio: 1.7 (ref 1.2–2.2)
Albumin: 4.5 g/dL (ref 3.8–4.8)
Alkaline Phosphatase: 116 IU/L (ref 44–121)
BUN/Creatinine Ratio: 13 (ref 10–24)
BUN: 16 mg/dL (ref 8–27)
Bilirubin Total: 1.2 mg/dL (ref 0.0–1.2)
CO2: 23 mmol/L (ref 20–29)
Calcium: 10.1 mg/dL (ref 8.6–10.2)
Chloride: 102 mmol/L (ref 96–106)
Creatinine, Ser: 1.23 mg/dL (ref 0.76–1.27)
Globulin, Total: 2.6 g/dL (ref 1.5–4.5)
Glucose: 106 mg/dL — ABNORMAL HIGH (ref 70–99)
Potassium: 4.9 mmol/L (ref 3.5–5.2)
Sodium: 138 mmol/L (ref 134–144)
Total Protein: 7.1 g/dL (ref 6.0–8.5)
eGFR: 66 mL/min/{1.73_m2} (ref >59)

## 2022-03-10 MED ORDER — OXYCODONE HCL 5 MG PO TABS
5.0000 mg | ORAL_TABLET | Freq: Four times a day (QID) | ORAL | 0 refills | Status: DC | PRN
  Filled 2022-03-10: qty 30, 8d supply, fill #0

## 2022-03-10 NOTE — Telephone Encounter (Signed)
(  Key: [place]) ? ?Your information has been sent to [facility]. ?

## 2022-03-10 NOTE — Patient Instructions (Signed)
Continue wound packing.  ?Continue to keep stools regular and soft. ?Wean off pain medication.  ?

## 2022-03-11 ENCOUNTER — Other Ambulatory Visit (HOSPITAL_COMMUNITY): Payer: Self-pay

## 2022-03-11 NOTE — Progress Notes (Signed)
[facility] ? ?Doing good. Having Bms. Eating. Wound packing going well in the lower midline. ? ?BP 128/73   Pulse 71   Temp 98.2 ?F (36.8 ?C) (Oral)   Resp 16   Ht '5\' 9"'$  (1.753 m)   Wt 223 lb (101.2 kg)   SpO2 94%   BMI 32.93 kg/m?  ?Midline lower wound 2cm opening granulation, superficial  ? ?Patient s/p colostomy reversal, small wound dehiscence. Doing well.  ?Continue wound packing.  ?Continue to keep stools regular and soft. ?Wean off pain medication. Refilled roxicodone once more  ? ?Future Appointments  ?Date Time Provider [place]  ?03/17/2022 10:00 AM [facility]- CCM NURSE [facility] None  ?04/07/2022  9:15 AM [name], MD [facility] None  ?06/11/2022 10:10 AM Dettinger, Fransisca Kaufmann, MD [facility] None  ? ? ? ?Curlene Labrum, MD ?[facility] ?[place], [place] [address] ?[phone] (office) ? ?

## 2022-03-12 NOTE — Telephone Encounter (Signed)
Francisco Allen (Key: [id number]) ?Ozempic (1 MG/DOSE) '4MG'$ /3ML pen-injectors ?  ?Form ?MedImpact ePA Form 2017 NCPDP ?Created ?3 days ago ?Sent to Plan ?3 days ago ?Plan Response ?3 days ago ?Submit Clinical Questions ?2 days ago ?Determination ?Favorable ?21 hours ago ?Message from Plan ?The request has been approved. The authorization is effective for a maximum of 12 fills from 03/11/2022 to 03/11/2023, as long as the member is enrolled in their current health plan. The request was approved as submitted. Approved for 3 mL per 28 days. A written notification letter will follow with additional details. ?

## 2022-03-13 ENCOUNTER — Other Ambulatory Visit (HOSPITAL_COMMUNITY): Payer: Self-pay

## 2022-03-16 ENCOUNTER — Telehealth: Payer: No Typology Code available for payment source

## 2022-03-17 ENCOUNTER — Encounter: Payer: Self-pay | Admitting: *Deleted

## 2022-03-17 ENCOUNTER — Ambulatory Visit: Payer: No Typology Code available for payment source | Admitting: *Deleted

## 2022-03-17 LAB — TOXASSURE SELECT 13 (MW), URINE

## 2022-03-17 NOTE — Chronic Care Management (AMB) (Signed)
?Chronic Care Management  ? ?CCM RN Visit Note ? ?03/17/2022 ?Name: Francisco Allen MRN: [id number] DOB: 04/21/1961 ? ?Subjective: ?Francisco Allen is a 61 y.o. year old male who is a primary care patient of Francisco Allen, Francisco Kaufmann, MD. The care management team was consulted for assistance with disease management and care coordination needs.   ? ?Engaged with patient by telephone for follow up visit in response to provider referral for case management and/or care coordination services.  ? ?Consent to Services:  ?The patient was given information about Chronic Care Management services, agreed to services, and gave verbal consent prior to initiation of services.  Please see initial visit note for detailed documentation.  ? ?Patient agreed to services and verbal consent obtained.  ? ?Assessment: Review of patient past medical history, allergies, medications, health status, including review of consultants reports, laboratory and other test data, was performed as part of comprehensive evaluation and provision of chronic care management services.  ? ?SDOH (Social Determinants of Health) assessments and interventions performed:   ? ?CCM Care Plan ? ?Allergies  ?Allergen Reactions  ? Bee Venom Anaphylaxis, Swelling and Other (See Comments)  ?  Anaphylactic shock  ? Penicillins Anaphylaxis and Swelling  ?  Airway closes up ?Has patient had a PCN reaction causing immediate rash, facial/tongue/throat swelling, SOB or lightheadedness with hypotension: Yes ?Has patient had a PCN reaction causing severe rash involving mucus membranes or skin necrosis: Yes ?Has patient had a PCN reaction that required hospitalization: Francisco Allen ?Has patient had a PCN reaction occurring within the last 10 years: No ?If all of the above answers are "NO", then may proceed with Cephalosporin use. ?  ? ? ?Outpatient Encounter Medications as of 03/17/2022  ?Medication Sig Note  ? ALPRAZolam (XANAX) 1 MG tablet Take 1 tablet by mouth 3 times daily as needed for anxiety.    ? carvedilol (COREG) 6.25 MG tablet Take 1 tablet (6.25 mg total) by mouth 2 (two) times daily with a meal in the morning and evening   ? dapagliflozin propanediol (FARXIGA) 5 MG TABS tablet Take 1 tablet (5 mg total) by mouth daily before breakfast. 12/16/2021: Samples ?  ? diclofenac Sodium (VOLTAREN) 1 % GEL Apply 2 grams topically 4 times daily.   ? docusate sodium (COLACE) 100 MG capsule Take 1 capsule (100 mg total) by mouth 2 (two) times daily.   ? lisinopril (ZESTRIL) 2.5 MG tablet Take 2.5 mg by mouth daily.   ? Multiple Vitamins-Minerals (MENS 50+ MULTI VITAMIN/MIN PO) Take 1 tablet by mouth daily.   ? Omega-3 Fatty Acids (FISH OIL) 1000 MG CAPS Take 1,000 mg by mouth daily.   ? ondansetron (ZOFRAN) 4 MG tablet Take 1 tablet (4 mg total) by mouth every 6 (six) hours as needed for nausea.   ? oxyCODONE (OXY IR/ROXICODONE) 5 MG immediate release tablet Take 1 tablet (5 mg total) by mouth every 6 (six) hours as needed for severe pain or breakthrough pain.   ? rosuvastatin (CRESTOR) 40 MG tablet Take 1 tablet by mouth daily.   ? Semaglutide, 1 MG/DOSE, 4 MG/3ML SOPN Inject 1 mg as directed once a week.   ? tamsulosin (FLOMAX) 0.4 MG CAPS capsule Take 1 capsule (0.4 mg total) by mouth 2 (two) times daily.   ? ?No facility-administered encounter medications on file as of 03/17/2022.  ? ? ?Patient Active Problem List  ? Diagnosis Date Noted  ? Hematoma 02/12/2022  ? History of colostomy reversal 02/02/2022  ? Parastomal hernia without  obstruction or gangrene   ? Ventral hernia without obstruction or gangrene   ? CKD stage 3 due to type 2 diabetes mellitus ([place]) 10/15/2021  ? Colostomy in place [facility]) 05/13/2021  ? HTN (hypertension) 09/23/2017  ? Tobacco use 09/23/2017  ? Type 2 diabetes mellitus with microalbuminuria ([place]) 02/09/2017  ? Diverticulitis of colon 10/21/2015  ? Morbid obesity ([place]) 05/14/2015  ? Migraines 04/09/2014  ? GAD (generalized anxiety disorder) 04/04/2013  ? Hyperlipidemia 04/04/2013   ? ? ?Conditions to be addressed/monitored:HTN, DMII, Osteoarthritis, and recent colostomy reversal ? ?Care Plan : [facility] Care Plan  ?Updates made by Ilean China, RN since 03/17/2022 12:00 AM  ?  ? ?Problem: Chronic Disease Managemet Needs   ?Priority: High  ?Onset Date: 03/02/2022  ?  ? ?Long-Range Goal: Patient will Work with Consulting civil engineer Regarding Care Management and [place] with diabetes HTN, DM, CKD 3, Arthritis, Hx of colostomy reversal   ?Start Date: 03/02/2022  ?Expected End Date: 03/03/2023  ?This Visit's Progress: On track  ?Recent Progress: On track  ?Priority: High  ?Note:   ?Current Barriers:  ?Chronic Disease Management support and education needs related to HTN, DM, CKD 3, Arthritis, Hx of colostomy reversal ? ?RNCM Clinical Goal(s):  ?Patient will continue to work with RN Care Manager and/or Social Worker to address care management and care coordination needs related to HTN, DM, CKD 3, Arthritis, Hx of colostomy reversal as evidenced by adherence to CM Team Scheduled appointments     through collaboration with RN Care manager, provider, and care team.  ? ?Interventions: ?1:1 collaboration with primary care provider regarding development and update of comprehensive plan of care as evidenced by provider attestation and co-signature ?Inter-disciplinary care team collaboration (see longitudinal plan of care) ?Evaluation of current treatment plan related to  self management and patient's adherence to plan as established by provider ?Discussed recent colostomy and reversal and follow-up appt with surgeon ?Therapeutic listening utilized regarding change in functional status since onset of illness and need to retire earlier than planned ?Discussed family/social support ?Encouraged increasing physical activity if tolerated and to remain as physically and socially active as possible ? ? ?Diabetes:  (Status: Goal on Track (progressing): YES.) Long Term Goal  ?Lab Results  ?Component Value  Date  ? HGBA1C 6.7 (H) 01/29/2022  ? HGBA1C 6.9 (H) 01/06/2022  ? HGBA1C 7.8 (H) 12/03/2021  ? ?Lab Results  ?Component Value Date  ? [place] 47 07/31/2021  ? CREATININE 1.25 (H) 02/06/2022  ?Assessed patient's understanding of A1c goal: <7% ?Reviewed medications with patient and discussed importance of medication adherence;        ?Counseled on importance of regular laboratory monitoring as prescribed;        ?Discussed plans with patient for ongoing care management follow up and provided patient with direct contact information for care management team;      ?call provider for findings outside established parameters;       ?Review of patient status, including review of consultants reports, relevant laboratory and other test results, and medications completed;       ?Assessed social determinant of health barriers;        ?Discussed use of CGM for blood sugar management ?Discussed diet and recommended ADA Carb Modified diet ? ? ?Arthritis (Knee pain):  (Status: Goal on Track (progressing): YES.) Long Term Goal  ?Discussed knee pain ?Assessed mobility and ability to perform ADLs ?Discussed family/social support ?Discussed periodic use of knee brace for pain relief and  support ?Discussed pain management strategies ?Reviewed recent office note with PCP ?Reviewed medications. Prescribed voltaren gel to help with knee pain/swelling. Using PRN pain. ?Discussed worsening of knee "tightness" while walking. Recommended to premedicate with voltaren gel prior to going shopping or when he anticipates being active. Education provided on Voltaren Gel MOA and explained that if he uses it before activity it may decrease swelling and thus decrease "tightness" and prevent pain. Patient is agreeable to this plan. ?Advised to f/u with PCP with any new or worsening symptoms ?Encouraged to reach out to [facility] as needed [phone] ? ? ?Constipation:  (Status: New goal.) Long Term Goal  ?Hx of recent colostomy reversal.  ?Reviewed recent  surgical office notes. Small wound dehiscence that is being packed daily.  ?Discussed presentation of wound. Would is getting smaller and his niece is packing it daily. No pain, redness, drainage, etc. ?Encourag

## 2022-03-17 NOTE — Patient Instructions (Signed)
Visit Information ? ?Patient Goals/Self-Care Activities: ?Take medications as prescribed   ?Perform all self care activities independently  ?Perform IADL's (shopping, preparing meals, housekeeping, managing finances) independently ?Call provider office for new concerns or questions  ?take the blood sugar meter to all doctor visits ?Use Voltaren gel on knees prior to walking or other physical activity ?Prevent constipation by eating vegetables, low glycemic fruits, fiber, and water. Stool softener if needed ?Call surgeon, Dr Constance Haw, as needed for any new or worsening symptoms related to colostomy revision, surgical incision, or constipation ?Call PCP with any new or worsening knee pain symptoms ?Increase activity level as tolerated ?Stay socially active and find hobbies or something to do daily ? ?Patient verbalizes understanding of instructions and care plan provided today and agrees to view in [place]. Active MyChart status confirmed with patient.   ? ?Plan:Telephone follow up appointment with care management team member scheduled for:  04/17/22 with RNCM ?The patient has been provided with contact information for the care management team and has been advised to call with any health related questions or concerns.  ? ?Chong Sicilian, BSN, RN-BC ?Embedded Chronic Care Manager ?[place] / [place] Management ?Direct Dial: [phone] ?  ?

## 2022-03-18 ENCOUNTER — Other Ambulatory Visit (HOSPITAL_COMMUNITY): Payer: Self-pay

## 2022-04-07 ENCOUNTER — Ambulatory Visit (INDEPENDENT_AMBULATORY_CARE_PROVIDER_SITE_OTHER): Payer: No Typology Code available for payment source | Admitting: General Surgery

## 2022-04-07 ENCOUNTER — Encounter: Payer: Self-pay | Admitting: General Surgery

## 2022-04-07 VITALS — BP 125/79 | HR 74 | Temp 97.8°F | Resp 16 | Ht 69.0 in | Wt 219.0 lb

## 2022-04-07 DIAGNOSIS — Z09 Encounter for follow-up examination after completed treatment for conditions other than malignant neoplasm: Secondary | ICD-10-CM

## 2022-04-07 NOTE — Progress Notes (Signed)
Subjective:     Francisco Allen  Patient here for follow-up wound check.  Has been doing normal saline wet-to-dry dressings. Objective:    BP 125/79   Pulse 74   Temp 97.8 F (36.6 C) (Oral)   Resp 16   Ht '5\' 9"'$  (1.753 m)   Wt 219 lb (99.3 kg)   SpO2 95%   BMI 32.34 kg/m   General:  alert, cooperative, and no distress  Abdomen is soft.  2 areas of the lower incision are healing very well by secondary intention.  Epithelialization has started to occur.  A small Chromic Gut suture was spitting out and it was removed.  No erythema present.     Assessment:    Doing well postoperatively.    Healing well by secondary intention. Plan:  May start applying triple antibiotic ointment twice a day.  May shower and clean the wound with soap and water.  Follow-up here in 2 weeks with Dr. Constance Haw for wound check.

## 2022-04-11 ENCOUNTER — Other Ambulatory Visit (HOSPITAL_COMMUNITY): Payer: Self-pay

## 2022-04-17 ENCOUNTER — Encounter: Payer: Self-pay | Admitting: *Deleted

## 2022-04-17 ENCOUNTER — Ambulatory Visit: Payer: No Typology Code available for payment source | Admitting: *Deleted

## 2022-04-17 DIAGNOSIS — E1129 Type 2 diabetes mellitus with other diabetic kidney complication: Secondary | ICD-10-CM

## 2022-04-17 DIAGNOSIS — K56699 Other intestinal obstruction unspecified as to partial versus complete obstruction: Secondary | ICD-10-CM

## 2022-04-17 NOTE — Chronic Care Management (AMB) (Signed)
Care Management    RN Visit Note  04/17/2022 Name: Francisco Allen MRN: [id number] DOB: 01/08/1961  Subjective: Francisco Allen is a 61 y.o. year old male who is a primary care patient of [place], Francisco Kaufmann, MD. The care management team was consulted for assistance with disease management and care coordination needs.    Engaged with patient by telephone for follow up visit in response to provider referral for case management and/or care coordination services.   Consent to Services:   Francisco Allen was given information about Care Management services today including:  Care Management services includes personalized support from designated clinical staff supervised by his physician, including individualized plan of care and coordination with other care providers 24/7 contact phone numbers for assistance for urgent and routine care needs. The patient may stop case management services at any time by phone call to the office staff.  Patient agreed to services and consent obtained.   Assessment: Review of patient past medical history, allergies, medications, health status, including review of consultants reports, laboratory and other test data, was performed as part of comprehensive evaluation and provision of chronic care management services.   SDOH (Social Determinants of Health) assessments and interventions performed:    Care Plan  Allergies  Allergen Reactions   Bee Venom Anaphylaxis, Swelling and Other (See Comments)    Anaphylactic shock   Penicillins Anaphylaxis and Swelling    Airway closes up Has patient had a PCN reaction causing immediate rash, facial/tongue/throat swelling, SOB or lightheadedness with hypotension: Yes Has patient had a PCN reaction causing severe rash involving mucus membranes or skin necrosis: Yes Has patient had a PCN reaction that required hospitalization: Francisco Allen Has patient had a PCN reaction occurring within the last 10 years: No If all of the above answers are  "NO", then may proceed with Cephalosporin use.     Outpatient Encounter Medications as of 04/17/2022  Medication Sig Note   ALPRAZolam (XANAX) 1 MG tablet Take 1 tablet by mouth 3 times daily as needed for anxiety.    carvedilol (COREG) 6.25 MG tablet Take 1 tablet (6.25 mg total) by mouth 2 (two) times daily with a meal in the morning and evening    dapagliflozin propanediol (FARXIGA) 5 MG TABS tablet Take 1 tablet (5 mg total) by mouth daily before breakfast. 12/16/2021: Samples    diclofenac Sodium (VOLTAREN) 1 % GEL Apply 2 grams topically 4 times daily.    docusate sodium (COLACE) 100 MG capsule Take 1 capsule (100 mg total) by mouth 2 (two) times daily.    lisinopril (ZESTRIL) 2.5 MG tablet Take 2.5 mg by mouth daily.    Multiple Vitamins-Minerals (MENS 50+ MULTI VITAMIN/MIN PO) Take 1 tablet by mouth daily.    Omega-3 Fatty Acids (FISH OIL) 1000 MG CAPS Take 1,000 mg by mouth daily.    ondansetron (ZOFRAN) 4 MG tablet Take 1 tablet (4 mg total) by mouth every 6 (six) hours as needed for nausea.    oxyCODONE (OXY IR/ROXICODONE) 5 MG immediate release tablet Take 1 tablet (5 mg total) by mouth every 6 (six) hours as needed for severe pain or breakthrough pain.    rosuvastatin (CRESTOR) 40 MG tablet Take 1 tablet by mouth daily.    Semaglutide, 1 MG/DOSE, 4 MG/3ML SOPN Inject 1 mg as directed once a week.    tamsulosin (FLOMAX) 0.4 MG CAPS capsule Take 1 capsule (0.4 mg total) by mouth 2 (two) times daily.    No facility-administered encounter medications  on file as of 04/17/2022.    Patient Active Problem List   Diagnosis Date Noted   Hematoma 02/12/2022   History of colostomy reversal 02/02/2022   Parastomal hernia without obstruction or gangrene    Ventral hernia without obstruction or gangrene    CKD stage 3 due to type 2 diabetes mellitus ([place]) 10/15/2021   Colostomy in place ([place]) 05/13/2021   HTN (hypertension) 09/23/2017   Tobacco use 09/23/2017   Type 2 diabetes mellitus with  microalbuminuria ([place]) 02/09/2017   Diverticulitis of colon 10/21/2015   Morbid obesity ([place]) 05/14/2015   Migraines 04/09/2014   GAD (generalized anxiety disorder) 04/04/2013   Hyperlipidemia 04/04/2013    Conditions to be addressed/monitored: DMII and constipation, recent colostomy reversal  Care Plan : [place]  Updates made by Ilean China, RN since 04/17/2022 12:00 AM     Problem: Chronic Disease Managemet Needs   Priority: High  Onset Date: 03/02/2022     Long-Range Goal: Patient will Work with RN Care Manager Regarding Care Management and [place] with diabetes HTN, DM, CKD 3, Arthritis, Hx of colostomy reversal   Start Date: 03/02/2022  Expected End Date: 03/03/2023  This Visit's Progress: On track  Recent Progress: On track  Priority: High  Note:   Current Barriers:  Chronic Disease Management support and education needs related to HTN, DM, CKD 3, Arthritis, Hx of colostomy reversal  RNCM Clinical Goal(s):  Patient will continue to work with RN Care Manager and/or Social Worker to address care management and care coordination needs related to HTN, DM, CKD 3, Arthritis, Hx of colostomy reversal as evidenced by adherence to CM Team Scheduled appointments     through collaboration with LCSW, provider, and care team.   Interventions: 1:1 collaboration with primary care provider regarding development and update of comprehensive plan of care as evidenced by provider attestation and co-signature Inter-disciplinary care team collaboration (see longitudinal plan of care) Evaluation of current treatment plan related to  self management and patient's adherence to plan as established by provider Discussed recent colostomy and reversal and follow-up appt with surgeon Therapeutic listening utilized regarding change in functional status since onset of illness and need to retire earlier than planned Discussed family/social support Encouraged increasing physical  activity if tolerated and to remain as physically and socially active as possible. Patient has been able to increase in his activity level recently and is getting back to doing more of his normal routine   Diabetes:  (Status: Goal on Track (progressing): YES.) Long Term Goal  Lab Results  Component Value Date   HGBA1C 6.7 (H) 01/29/2022   HGBA1C 6.9 (H) 01/06/2022   HGBA1C 7.8 (H) 12/03/2021   Lab Results  Component Value Date   LDLCALC 47 07/31/2021   CREATININE 1.23 03/09/2022  Assessed patient's understanding of A1c goal: <7% Reviewed medications with patient and discussed importance of medication adherence;        Counseled on importance of regular laboratory monitoring as prescribed;        Discussed plans with patient for ongoing care management follow up and provided patient with direct contact information for care management team;      call provider for findings outside established parameters;       Review of patient status, including review of consultants reports, relevant laboratory and other test results, and medications completed;       Assessed social determinant of health barriers;        Discussed use of  CGM for blood sugar management Discussed diet and recommended ADA Carb Modified diet   Arthritis (Knee pain):  (Status: Goal on Track (progressing): YES.) Long Term Goal  Discussed knee pain Assessed mobility and ability to perform ADLs Discussed family/social support Discussed periodic use of knee brace for pain relief and support Discussed pain management strategies Reviewed medications. Prescribed voltaren gel to help with knee pain/swelling. Using PRN pain. Previously discussed worsening of knee "tightness" while walking. Recommended to premedicate with voltaren gel prior to going shopping or when he anticipates being active. Education provided on Voltaren Gel MOA and explained that if he uses it before activity it may decrease swelling and thus decrease "tightness"  and prevent pain. Patient is agreeable to this plan. Advised to f/u with PCP with any new or worsening symptoms. May need to follow-up with ortho Encouraged to reach out to [facility] as needed [phone]   Constipation:  (Status: Goal on Track (progressing): YES.) Long Term Goal  Hx of recent colostomy reversal.  Reviewed recent surgical office notes. Small wound dehiscence that is being packed daily.  Discussed presentation of wound. Would is getting smaller and his niece is packing it daily. No pain, redness, drainage, etc. Encouraged to reach out to surgeon with any new or worsening symptoms Education provided on constipation prevention. Increase vegetables, low glycemic fruits, fiber, and water. Stool softener if needed. Reach out to PCP or surgeon with any new or worsening symptoms Reviewed and discussed upcoming follow-up appointment with surgeon  Patient Goals/Self-Care Activities: Take medications as prescribed   Perform all self care activities independently  Perform IADL's (shopping, preparing meals, housekeeping, managing finances) independently Call provider office for new concerns or questions  take the blood sugar meter to all doctor visits Use Voltaren gel on knees prior to walking or other physical activity Prevent constipation by eating vegetables, low glycemic fruits, fiber, and water. Stool softener if needed Call surgeon, Dr Constance Haw, as needed for any new or worsening symptoms related to colostomy revision, surgical incision, or constipation Call PCP with any new or worsening knee pain symptoms Increase activity level as tolerated Stay socially active and find hobbies or something to do daily  Plan: Telephone follow up appointment with care management team member scheduled for:  05/18/22 at 9:45 with [facility] The patient has been provided with contact information for the care management team and has been advised to call with any health related questions or concerns.    Chong Sicilian, BSN, RN-BC Embedded Chronic Care Manager [facility] / [place] Management Direct Dial: [phone]

## 2022-04-17 NOTE — Patient Instructions (Signed)
Visit Information  Thank you for taking time to visit with me today. Please don't hesitate to contact me if I can be of assistance to you before our next scheduled telephone appointment.  Following are the goals we discussed today:  Take medications as prescribed   Perform all self care activities independently  Perform IADL's (shopping, preparing meals, housekeeping, managing finances) independently Call provider office for new concerns or questions  take the blood sugar meter to all doctor visits Use Voltaren gel on knees prior to walking or other physical activity Prevent constipation by eating vegetables, low glycemic fruits, fiber, and water. Stool softener if needed Call surgeon, Dr Constance Haw, as needed for any new or worsening symptoms related to colostomy revision, surgical incision, or constipation Call PCP with any new or worsening knee pain symptoms Increase activity level as tolerated Stay socially active and find hobbies or something to do daily  Our next appointment is by telephone on 05/18/22 at 9:45  Please call the care guide team at [phone] if you need to cancel or reschedule your appointment.   If you are experiencing a Mental Health or [place] or need someone to talk to, please call the [facility]: [phone] call [phone]   Patient verbalizes understanding of instructions and care plan provided today and agrees to view in [place]. Active MyChart status and patient understanding of how to access instructions and care plan via MyChart confirmed with patient.     Chong Sicilian, BSN, RN-BC Embedded Chronic Care Manager [facility] / [place] Management Direct Dial: [phone]

## 2022-04-21 ENCOUNTER — Ambulatory Visit (INDEPENDENT_AMBULATORY_CARE_PROVIDER_SITE_OTHER): Payer: No Typology Code available for payment source | Admitting: General Surgery

## 2022-04-21 ENCOUNTER — Encounter: Payer: Self-pay | Admitting: General Surgery

## 2022-04-21 VITALS — BP 122/78 | HR 72 | Temp 97.7°F | Resp 14 | Ht 69.0 in | Wt 222.0 lb

## 2022-04-21 DIAGNOSIS — T8130XA Disruption of wound, unspecified, initial encounter: Secondary | ICD-10-CM

## 2022-04-21 DIAGNOSIS — Z9889 Other specified postprocedural states: Secondary | ICD-10-CM

## 2022-04-21 NOTE — Progress Notes (Signed)
[facility]  Doing well. Eating. Having Bms. Incisions healed.   BP 122/78   Pulse 72   Temp 97.7 F (36.5 C) (Oral)   Resp 14   Ht '5\' 9"'$  (1.753 m)   Wt 222 lb (100.7 kg)   SpO2 96%   BMI 32.78 kg/m  Midline healed, no erythema or drainage, some scabbing  Patient s/p Colostomy reversal and hernia repair with phasix. Doing well.  Diet and activity as tolerated. Keep stools regular. Call with questions or concerns.  If you have any hernia or notice anything you are worried about, let us know.   Curlene Labrum, MD [facility] [address] [place], [place] [address] [phone] (office)

## 2022-04-21 NOTE — Patient Instructions (Signed)
Diet and activity as tolerated. Keep stools regular. Call with questions or concerns.  If you have any hernia or notice anything you are worried about, let us know.

## 2022-04-27 ENCOUNTER — Other Ambulatory Visit (HOSPITAL_COMMUNITY): Payer: Self-pay

## 2022-05-13 ENCOUNTER — Other Ambulatory Visit (HOSPITAL_COMMUNITY): Payer: Self-pay

## 2022-05-13 MED ORDER — LISINOPRIL 5 MG PO TABS
ORAL_TABLET | ORAL | 3 refills | Status: DC
  Filled 2022-05-13: qty 45, 90d supply, fill #0

## 2022-05-18 ENCOUNTER — Ambulatory Visit: Payer: No Typology Code available for payment source | Admitting: *Deleted

## 2022-05-18 DIAGNOSIS — E1129 Type 2 diabetes mellitus with other diabetic kidney complication: Secondary | ICD-10-CM

## 2022-05-18 NOTE — Chronic Care Management (AMB) (Signed)
Care Management    RN Visit Note  05/18/2022 Name: Francisco Allen MRN: [id number] DOB: Apr 11, 1961  Subjective: Francisco Allen is a 61 y.o. year old male who is a primary care patient of Dettinger, Francisco Kaufmann, MD. The care management team was consulted for assistance with disease management and care coordination needs.    Engaged with patient by telephone for follow up visit in response to provider referral for case management and/or care coordination services.   Consent to Services:   [name] was given information about Care Management services today including:  Care Management services includes personalized support from designated clinical staff supervised by his physician, including individualized plan of care and coordination with other care providers 24/7 contact phone numbers for assistance for urgent and routine care needs. The patient may stop case management services at any time by phone call to the office staff.  Patient agreed to services and consent obtained.   Assessment: Review of patient past medical history, allergies, medications, health status, including review of consultants reports, laboratory and other test data, was performed as part of comprehensive evaluation and provision of chronic care management services.   SDOH (Social Determinants of Health) assessments and interventions performed:    Care Plan  Allergies  Allergen Reactions   Bee Venom Anaphylaxis, Swelling and Other (See Comments)    Anaphylactic shock   Penicillins Anaphylaxis and Swelling    Airway closes up Has patient had a PCN reaction causing immediate rash, facial/tongue/throat swelling, SOB or lightheadedness with hypotension: Yes Has patient had a PCN reaction causing severe rash involving mucus membranes or skin necrosis: Yes Has patient had a PCN reaction that required hospitalization: Francisco Allen Has patient had a PCN reaction occurring within the last 10 years: No If all of the above answers  are "NO", then may proceed with Cephalosporin use.     Outpatient Encounter Medications as of 05/18/2022  Medication Sig Note   ALPRAZolam (XANAX) 1 MG tablet Take 1 tablet by mouth 3 times daily as needed for anxiety.    carvedilol (COREG) 6.25 MG tablet Take 1 tablet by mouth 2 times daily with a meal in the morning and evening    dapagliflozin propanediol (FARXIGA) 5 MG TABS tablet Take 1 tablet (5 mg total) by mouth daily before breakfast. 12/16/2021: Samples    diclofenac Sodium (VOLTAREN) 1 % GEL Apply 2 grams topically 4 times daily.    docusate sodium (COLACE) 100 MG capsule Take 1 capsule (100 mg total) by mouth 2 (two) times daily.    lisinopril (ZESTRIL) 2.5 MG tablet Take 2.5 mg by mouth daily.    lisinopril (ZESTRIL) 5 MG tablet Take 1/2 tablet (2.5 mg total) by mouth once daily.    Multiple Vitamins-Minerals (MENS 50+ MULTI VITAMIN/MIN PO) Take 1 tablet by mouth daily.    Omega-3 Fatty Acids (FISH OIL) 1000 MG CAPS Take 1,000 mg by mouth daily.    ondansetron (ZOFRAN) 4 MG tablet Take 1 tablet (4 mg total) by mouth every 6 (six) hours as needed for nausea.    oxyCODONE (OXY IR/ROXICODONE) 5 MG immediate release tablet Take 1 tablet (5 mg total) by mouth every 6 (six) hours as needed for severe pain or breakthrough pain.    rosuvastatin (CRESTOR) 40 MG tablet Take 1 tablet by mouth daily.    Semaglutide, 1 MG/DOSE, 4 MG/3ML SOPN Inject 1 mg as directed once a week.    tamsulosin (FLOMAX) 0.4 MG CAPS capsule Take 1 capsule (0.4 mg  total) by mouth 2 (two) times daily.    No facility-administered encounter medications on file as of 05/18/2022.    Patient Active Problem List   Diagnosis Date Noted   Hematoma 02/12/2022   History of colostomy reversal 02/02/2022   Parastomal hernia without obstruction or gangrene    Ventral hernia without obstruction or gangrene    CKD stage 3 due to type 2 diabetes mellitus ([place]) 10/15/2021   Colostomy in place ([place]) 05/13/2021   HTN  (hypertension) 09/23/2017   Tobacco use 09/23/2017   Type 2 diabetes mellitus with microalbuminuria ([place]) 02/09/2017   Diverticulitis of colon 10/21/2015   Morbid obesity ([place]) 05/14/2015   Migraines 04/09/2014   GAD (generalized anxiety disorder) 04/04/2013   Hyperlipidemia 04/04/2013    Care Plan : [place]  Updates made by Francisco China, RN since 05/18/2022 12:00 AM  Completed 05/18/2022   Problem: Chronic Disease Managemet Needs Resolved 05/18/2022  Priority: High  Onset Date: 03/02/2022     Long-Range Goal: Patient will Notify PCP if [place] are Needed Completed 05/18/2022  Start Date: 03/02/2022  Expected End Date: 03/03/2023  This Visit's Progress: On track  Recent Progress: On track  Priority: High  Note:   Current Barriers:  Chronic Disease Management support and education needs related to HTN, DM, CKD 3, Arthritis, Hx of colostomy reversal  RNCM Clinical Goal(s):  Patient will keep all medical appointments and will notify PCP if RN Care Coordination services are needed in the future.  Interventions: 1:1 collaboration with primary care provider regarding development and update of comprehensive plan of care as evidenced by provider attestation and co-signature Inter-disciplinary care team collaboration (see longitudinal plan of care)   Diabetes:  (Status: Goal Met.) Long Term Goal  Lab Results  Component Value Date   HGBA1C 6.7 (H) 01/29/2022   HGBA1C 6.9 (H) 01/06/2022   HGBA1C 7.8 (H) 12/03/2021   Lab Results  Component Value Date   LDLCALC 47 07/31/2021   CREATININE 1.23 03/09/2022   Arthritis (Knee pain):  (Status: Goal on Track (progressing): YES.) Long Term Goal  Discussed knee pain Assessed mobility and ability to perform ADLs Discussed family/social support Discussed periodic use of knee brace for pain relief and support Discussed pain management strategies Reviewed medications. Prescribed voltaren gel to help with knee  pain/swelling. Using PRN pain. Previously discussed worsening of knee "tightness" while walking. Recommended to premedicate with voltaren gel prior to going shopping or when he anticipates being active. Education provided on Voltaren Gel MOA and explained that if he uses it before activity it may decrease swelling and thus decrease "tightness" and prevent pain. Patient is agreeable to this plan. Advised to f/u with PCP with any new or worsening symptoms. May need to follow-up with ortho Encouraged to reach out to [facility] as needed [phone]   Constipation:  (Status: Goal Met.) Long Term Goal  Hx of recent colostomy reversal.  Reviewed recent surgical office notes. Small wound dehiscence that is being packed daily.  Discussed presentation of wound. Would is getting smaller and his niece is packing it daily. No pain, redness, drainage, etc. Encouraged to reach out to surgeon with any new or worsening symptoms Education provided on constipation prevention. Increase vegetables, low glycemic fruits, fiber, and water. Stool softener if needed. Reach out to PCP or surgeon with any new or worsening symptoms Reviewed and discussed upcoming follow-up appointment with surgeon  Patient Goals/Self-Care Activities: Take medications as prescribed   Perform all self care  activities independently  Perform IADL's (shopping, preparing meals, housekeeping, managing finances) independently Call provider office for new concerns or questions  take the blood sugar meter to all doctor visits Use Voltaren gel on knees prior to walking or other physical activity Prevent constipation by eating vegetables, low glycemic fruits, fiber, and water. Stool softener if needed Call surgeon, Dr Constance Haw, as needed for any new or worsening symptoms related to colostomy revision, surgical incision, or constipation Call PCP with any new or worsening knee pain symptoms Increase activity level as tolerated Stay socially active and  find hobbies or something to do  Notify PCP if care coordination services are needed  Plan: No further follow up required: Patient is stable and being removed from Care Coordination services. He is aware to reach out to PCP if services are needed in the future.   Chong Sicilian, BSN, RN-BC Embedded Chronic Care Manager [facility] [place] [facility] Direct Dial: [phone]

## 2022-05-18 NOTE — Patient Instructions (Signed)
Visit Information  Thank you for taking time to visit with me today.   Following are the goals we discussed today:  Take medications as prescribed   Perform all self care activities independently  Perform IADL's (shopping, preparing meals, housekeeping, managing finances) independently Call provider office for new concerns or questions  take the blood sugar meter to all doctor visits Use Voltaren gel on knees prior to walking or other physical activity Prevent constipation by eating vegetables, low glycemic fruits, fiber, and water. Stool softener if needed Call surgeon, Dr Constance Haw, as needed for any new or worsening symptoms related to colostomy revision, surgical incision, or constipation Call PCP with any new or worsening knee pain symptoms Increase activity level as tolerated Stay socially active and find hobbies or something to do  Notify PCP if care coordination services are needed  If you are experiencing a Mental Health or [place] or need someone to talk to, please call the [facility]: [phone] call [phone]   Patient verbalizes understanding of instructions and care plan provided today and agrees to view in [place]. Active MyChart status and patient understanding of how to access instructions and care plan via MyChart confirmed with patient.     Chong Sicilian, BSN, RN-BC Embedded Chronic Care Manager [facility] / [place] [facility] Direct Dial: [phone]

## 2022-05-26 ENCOUNTER — Other Ambulatory Visit (HOSPITAL_COMMUNITY): Payer: Self-pay

## 2022-05-27 ENCOUNTER — Other Ambulatory Visit (HOSPITAL_COMMUNITY): Payer: Self-pay

## 2022-05-27 MED ORDER — TAMSULOSIN HCL 0.4 MG PO CAPS
0.4000 mg | ORAL_CAPSULE | Freq: Two times a day (BID) | ORAL | 3 refills | Status: DC
  Filled 2022-05-27: qty 120, 60d supply, fill #0

## 2022-05-28 ENCOUNTER — Other Ambulatory Visit (HOSPITAL_COMMUNITY): Payer: Self-pay

## 2022-06-04 ENCOUNTER — Other Ambulatory Visit (HOSPITAL_COMMUNITY): Payer: Self-pay

## 2022-06-08 ENCOUNTER — Other Ambulatory Visit (HOSPITAL_COMMUNITY): Payer: Self-pay

## 2022-06-09 ENCOUNTER — Other Ambulatory Visit (HOSPITAL_COMMUNITY): Payer: Self-pay

## 2022-06-09 ENCOUNTER — Other Ambulatory Visit: Payer: Self-pay

## 2022-06-09 DIAGNOSIS — I10 Essential (primary) hypertension: Secondary | ICD-10-CM

## 2022-06-09 DIAGNOSIS — E1129 Type 2 diabetes mellitus with other diabetic kidney complication: Secondary | ICD-10-CM

## 2022-06-09 DIAGNOSIS — E1122 Type 2 diabetes mellitus with diabetic chronic kidney disease: Secondary | ICD-10-CM

## 2022-06-09 DIAGNOSIS — Z125 Encounter for screening for malignant neoplasm of prostate: Secondary | ICD-10-CM

## 2022-06-09 DIAGNOSIS — E782 Mixed hyperlipidemia: Secondary | ICD-10-CM

## 2022-06-10 ENCOUNTER — Other Ambulatory Visit (HOSPITAL_COMMUNITY): Payer: Self-pay

## 2022-06-11 ENCOUNTER — Ambulatory Visit (INDEPENDENT_AMBULATORY_CARE_PROVIDER_SITE_OTHER): Payer: No Typology Code available for payment source | Admitting: Family Medicine

## 2022-06-11 ENCOUNTER — Encounter: Payer: Self-pay | Admitting: Family Medicine

## 2022-06-11 ENCOUNTER — Other Ambulatory Visit: Payer: No Typology Code available for payment source

## 2022-06-11 ENCOUNTER — Other Ambulatory Visit (HOSPITAL_COMMUNITY): Payer: Self-pay

## 2022-06-11 VITALS — BP 120/74 | HR 81 | Temp 98.0°F | Ht 69.0 in | Wt 226.0 lb

## 2022-06-11 DIAGNOSIS — E1129 Type 2 diabetes mellitus with other diabetic kidney complication: Secondary | ICD-10-CM

## 2022-06-11 DIAGNOSIS — E782 Mixed hyperlipidemia: Secondary | ICD-10-CM | POA: Diagnosis not present

## 2022-06-11 DIAGNOSIS — F411 Generalized anxiety disorder: Secondary | ICD-10-CM

## 2022-06-11 DIAGNOSIS — I10 Essential (primary) hypertension: Secondary | ICD-10-CM | POA: Diagnosis not present

## 2022-06-11 DIAGNOSIS — E1122 Type 2 diabetes mellitus with diabetic chronic kidney disease: Secondary | ICD-10-CM

## 2022-06-11 DIAGNOSIS — R809 Proteinuria, unspecified: Secondary | ICD-10-CM

## 2022-06-11 DIAGNOSIS — Z125 Encounter for screening for malignant neoplasm of prostate: Secondary | ICD-10-CM

## 2022-06-11 DIAGNOSIS — N183 Chronic kidney disease, stage 3 unspecified: Secondary | ICD-10-CM

## 2022-06-11 LAB — BAYER DCA HB A1C WAIVED: HB A1C (BAYER DCA - WAIVED): 5.7 % — ABNORMAL HIGH (ref 4.8–5.6)

## 2022-06-11 MED ORDER — ALPRAZOLAM 1 MG PO TABS
1.0000 mg | ORAL_TABLET | Freq: Three times a day (TID) | ORAL | 2 refills | Status: DC | PRN
  Filled 2022-06-11 – 2022-07-08 (×2): qty 90, 30d supply, fill #0
  Filled 2022-08-06: qty 90, 30d supply, fill #1
  Filled 2022-09-07: qty 90, 30d supply, fill #2

## 2022-06-11 NOTE — Progress Notes (Signed)
BP 120/74   Pulse 81   Temp 98 F (36.7 C)   Ht '5\' 9"'$  (1.753 m)   Wt 226 lb (102.5 kg)   SpO2 96%   BMI 33.37 kg/m    Subjective:   Patient ID: Francisco Allen, male    DOB: 10-Sep-1961, 61 y.o.   MRN: [id number]  HPI: Francisco Allen is a 61 y.o. male presenting on 06/11/2022 for Medical Management of Chronic Issues, Diabetes, and Hyperlipidemia   HPI Anxiety recheck Current rx-Xanax 1 mg 3 times daily as needed # meds rx-90/month Effectiveness of current meds-works well Adverse reactions form meds-none  Pill count performed-No Last drug screen -03/12/2022 ( high risk q41m moderate risk q612mlow risk yearly ) Urine drug screen today- No [place]  If yes were their any concerning findings? -Was taking pain meds after surgery but has stopped now  No flowsheet data found.   Controlled substance contract signed on: 03/12/2022  Type 2 diabetes mellitus Patient comes in today for recheck of his diabetes. Patient has been currently taking Ozempic. Patient is currently on an ACE inhibitor/ARB. Patient has not seen an ophthalmologist this year. Patient denies any issues with their feet. The symptom started onset as an adult hypertension and hyperlipidemia ARE RELATED TO DM   Hypertension Patient is currently on lisinopril 2.5, and their blood pressure today is 120/74. Patient denies any lightheadedness or dizziness. Patient denies headaches, blurred vision, chest pains, shortness of breath, or weakness. Denies any side effects from medication and is content with current medication.   Hyperlipidemia Patient is coming in for recheck of his hyperlipidemia. The patient is currently taking Crestor. They deny any issues with myalgias or history of liver damage from it. They deny any focal numbness or weakness or chest pain.   Relevant past medical, surgical, family and social history reviewed and updated as indicated. Interim medical history since our last visit  reviewed. Allergies and medications reviewed and updated.  Review of Systems  Constitutional:  Negative for chills and fever.  Eyes:  Negative for visual disturbance.  Respiratory:  Negative for shortness of breath and wheezing.   Cardiovascular:  Negative for chest pain and leg swelling.  Gastrointestinal:  Positive for abdominal pain. Negative for constipation and diarrhea.  Musculoskeletal:  Negative for back pain and gait problem.  Skin:  Negative for rash.  Neurological:  Negative for dizziness, weakness and light-headedness.  All other systems reviewed and are negative.   Per HPI unless specifically indicated above   Allergies as of 06/11/2022       Reactions   Bee Venom Anaphylaxis, Swelling, Other (See Comments)   Anaphylactic shock   Penicillins Anaphylaxis, Swelling   Airway closes up Has patient had a PCN reaction causing immediate rash, facial/tongue/throat swelling, SOB or lightheadedness with hypotension: Yes Has patient had a PCN reaction causing severe rash involving mucus membranes or skin necrosis: Yes Has patient had a PCN reaction that required hospitalization: YeAlfred Levinsas patient had a PCN reaction occurring within the last 10 years: No If all of the above answers are "NO", then may proceed with Cephalosporin use.        Medication List        Accurate as of June 11, 2022 11:02 AM. If you have any questions, ask your nurse or doctor.          STOP taking these medications    dapagliflozin propanediol 5 MG Tabs tablet Commonly known as: FARXIGA Stopped  by: [name], MD       TAKE these medications    ALPRAZolam 1 MG tablet Commonly known as: XANAX Take 1 tablet by mouth 3 times daily as needed for anxiety.   carvedilol 6.25 MG tablet Commonly known as: COREG Take 1 tablet by mouth 2 times daily with a meal in the morning and evening   diclofenac Sodium 1 % Gel Commonly known as: Voltaren Apply 2 grams topically 4 times daily.    docusate sodium 100 MG capsule Commonly known as: COLACE Take 1 capsule (100 mg total) by mouth 2 (two) times daily.   Fish Oil 1000 MG Caps Take 1,000 mg by mouth daily.   lisinopril 2.5 MG tablet Commonly known as: ZESTRIL Take 2.5 mg by mouth daily. What changed: Another medication with the same name was removed. Continue taking this medication, and follow the directions you see here. Changed by: [name], MD   MENS 50+ MULTI VITAMIN/MIN PO Take 1 tablet by mouth daily.   ondansetron 4 MG tablet Commonly known as: ZOFRAN Take 1 tablet (4 mg total) by mouth every 6 (six) hours as needed for nausea.   oxyCODONE 5 MG immediate release tablet Commonly known as: Oxy IR/ROXICODONE Take 1 tablet (5 mg total) by mouth every 6 (six) hours as needed for severe pain or breakthrough pain.   Ozempic (1 MG/DOSE) 4 MG/3ML Sopn Generic drug: Semaglutide (1 MG/DOSE) Inject 1 mg as directed once a week.   rosuvastatin 40 MG tablet Commonly known as: CRESTOR Take 1 tablet by mouth daily.   tamsulosin 0.4 MG Caps capsule Commonly known as: FLOMAX Take 1 capsule (0.4 mg total) by mouth 2 (two) times daily.   tamsulosin 0.4 MG Caps capsule Commonly known as: FLOMAX Take 1 capsule (0.4 mg total) by mouth 2 (two) times daily.         Objective:   BP 120/74   Pulse 81   Temp 98 F (36.7 C)   Ht '5\' 9"'$  (1.753 m)   Wt 226 lb (102.5 kg)   SpO2 96%   BMI 33.37 kg/m   Wt Readings from Last 3 Encounters:  06/11/22 226 lb (102.5 kg)  04/21/22 222 lb (100.7 kg)  04/07/22 219 lb (99.3 kg)    Physical Exam Vitals and nursing note reviewed.  Constitutional:      General: He is not in acute distress.    Appearance: He is well-developed. He is not diaphoretic.  Eyes:     General: No scleral icterus.    Conjunctiva/sclera: Conjunctivae normal.  Neck:     Thyroid: No thyromegaly.  Cardiovascular:     Rate and Rhythm: Normal rate and regular rhythm.     Heart sounds:  Normal heart sounds. No murmur heard. Pulmonary:     Effort: Pulmonary effort is normal. No respiratory distress.     Breath sounds: Normal breath sounds. No wheezing.  Abdominal:     General: Abdomen is flat. Bowel sounds are normal. There is distension.     Palpations: Abdomen is soft.     Tenderness: There is abdominal tenderness.  Musculoskeletal:        General: Normal range of motion.     Cervical back: Neck supple.  Lymphadenopathy:     Cervical: No cervical adenopathy.  Skin:    General: Skin is warm and dry.     Findings: No rash.  Neurological:     Mental Status: He is alert and oriented to person, place,  and time.     Coordination: Coordination normal.  Psychiatric:        Behavior: Behavior normal.       Assessment & Plan:   Problem List Items Addressed This Visit       Cardiovascular and Mediastinum   HTN (hypertension)     Endocrine   Type 2 diabetes mellitus with microalbuminuria ([place]) - Primary   CKD stage 3 due to type 2 diabetes mellitus ([place])     Other   GAD (generalized anxiety disorder)   Relevant Medications   ALPRAZolam (XANAX) 1 MG tablet   Hyperlipidemia    Continue alprazolam, we will refill that.  A1c is 5.7 which looks good, no change in medication.  Blood pressure looks decent at 120/74 and he denies lightheadedness or dizziness.  He still has some distention and tenderness in his abdominal area after his surgery and repair. Follow up plan: Return in about 3 months (around 09/11/2022), or if symptoms worsen or fail to improve, for Anxiety and diabetes.  Counseling provided for all of the vaccine components No orders of the defined types were placed in this encounter.   Caryl Pina, MD [facility] 06/11/2022, 11:02 AM

## 2022-06-12 LAB — CBC WITH DIFFERENTIAL/PLATELET
Basophils Absolute: 0.1 10*3/uL (ref 0.0–0.2)
Basos: 1 %
EOS (ABSOLUTE): 0.2 10*3/uL (ref 0.0–0.4)
Eos: 1 %
Hematocrit: 52.7 % — ABNORMAL HIGH (ref 37.5–51.0)
Hemoglobin: 17.3 g/dL (ref 13.0–17.7)
Immature Grans (Abs): 0.1 10*3/uL (ref 0.0–0.1)
Immature Granulocytes: 1 %
Lymphocytes Absolute: 1.5 10*3/uL (ref 0.7–3.1)
Lymphs: 11 %
MCH: 27.2 pg (ref 26.6–33.0)
MCHC: 32.8 g/dL (ref 31.5–35.7)
MCV: 83 fL (ref 79–97)
Monocytes Absolute: 1.1 10*3/uL — ABNORMAL HIGH (ref 0.1–0.9)
Monocytes: 9 %
Neutrophils Absolute: 10.5 10*3/uL — ABNORMAL HIGH (ref 1.4–7.0)
Neutrophils: 77 %
Platelets: 171 10*3/uL (ref 150–450)
RBC: 6.35 x10E6/uL — ABNORMAL HIGH (ref 4.14–5.80)
RDW: 16.8 % — ABNORMAL HIGH (ref 11.6–15.4)
WBC: 13.5 10*3/uL — ABNORMAL HIGH (ref 3.4–10.8)

## 2022-06-12 LAB — LIPID PANEL
Chol/HDL Ratio: 3.1 ratio (ref 0.0–5.0)
Cholesterol, Total: 86 mg/dL — ABNORMAL LOW (ref 100–199)
HDL: 28 mg/dL — ABNORMAL LOW (ref >39)
LDL Chol Calc (NIH): 28 mg/dL (ref 0–99)
Triglycerides: 188 mg/dL — ABNORMAL HIGH (ref 0–149)
VLDL Cholesterol Cal: 30 mg/dL (ref 5–40)

## 2022-06-12 LAB — CMP14+EGFR
ALT: 15 IU/L (ref 0–44)
AST: 16 IU/L (ref 0–40)
Albumin/Globulin Ratio: 1.6 (ref 1.2–2.2)
Albumin: 4.6 g/dL (ref 3.9–4.9)
Alkaline Phosphatase: 103 IU/L (ref 44–121)
BUN/Creatinine Ratio: 13 (ref 10–24)
BUN: 15 mg/dL (ref 8–27)
Bilirubin Total: 1.1 mg/dL (ref 0.0–1.2)
CO2: 21 mmol/L (ref 20–29)
Calcium: 10.1 mg/dL (ref 8.6–10.2)
Chloride: 101 mmol/L (ref 96–106)
Creatinine, Ser: 1.19 mg/dL (ref 0.76–1.27)
Globulin, Total: 2.8 g/dL (ref 1.5–4.5)
Glucose: 131 mg/dL — ABNORMAL HIGH (ref 70–99)
Potassium: 4.9 mmol/L (ref 3.5–5.2)
Sodium: 141 mmol/L (ref 134–144)
Total Protein: 7.4 g/dL (ref 6.0–8.5)
eGFR: 69 mL/min/{1.73_m2} (ref >59)

## 2022-06-12 LAB — PSA, TOTAL AND FREE
PSA, Free Pct: 18.6 %
PSA, Free: 0.39 ng/mL
Prostate Specific Ag, Serum: 2.1 ng/mL (ref 0.0–4.0)

## 2022-06-13 ENCOUNTER — Other Ambulatory Visit (HOSPITAL_COMMUNITY): Payer: Self-pay

## 2022-06-23 ENCOUNTER — Other Ambulatory Visit (HOSPITAL_COMMUNITY): Payer: Self-pay

## 2022-06-25 ENCOUNTER — Other Ambulatory Visit (HOSPITAL_COMMUNITY): Payer: Self-pay

## 2022-07-08 ENCOUNTER — Other Ambulatory Visit: Payer: Self-pay | Admitting: Family Medicine

## 2022-07-08 ENCOUNTER — Other Ambulatory Visit (HOSPITAL_COMMUNITY): Payer: Self-pay

## 2022-07-08 MED ORDER — LISINOPRIL 2.5 MG PO TABS
2.5000 mg | ORAL_TABLET | Freq: Every day | ORAL | 0 refills | Status: DC
  Filled 2022-07-08: qty 90, 90d supply, fill #0

## 2022-07-16 ENCOUNTER — Other Ambulatory Visit (HOSPITAL_COMMUNITY): Payer: Self-pay

## 2022-07-23 ENCOUNTER — Other Ambulatory Visit (HOSPITAL_COMMUNITY): Payer: Self-pay

## 2022-07-31 ENCOUNTER — Other Ambulatory Visit: Payer: No Typology Code available for payment source

## 2022-08-04 ENCOUNTER — Encounter: Payer: Self-pay | Admitting: *Deleted

## 2022-08-06 ENCOUNTER — Other Ambulatory Visit (HOSPITAL_COMMUNITY): Payer: Self-pay

## 2022-08-07 ENCOUNTER — Encounter: Payer: Self-pay | Admitting: Hematology

## 2022-08-07 ENCOUNTER — Other Ambulatory Visit (HOSPITAL_COMMUNITY): Payer: Self-pay

## 2022-08-07 ENCOUNTER — Inpatient Hospital Stay: Payer: No Typology Code available for payment source

## 2022-08-07 ENCOUNTER — Inpatient Hospital Stay: Payer: No Typology Code available for payment source | Attending: Hematology | Admitting: Hematology

## 2022-08-07 DIAGNOSIS — D751 Secondary polycythemia: Secondary | ICD-10-CM

## 2022-08-07 DIAGNOSIS — Z800 Family history of malignant neoplasm of digestive organs: Secondary | ICD-10-CM | POA: Diagnosis not present

## 2022-08-07 DIAGNOSIS — F17210 Nicotine dependence, cigarettes, uncomplicated: Secondary | ICD-10-CM

## 2022-08-07 LAB — CBC WITH DIFFERENTIAL/PLATELET
Abs Immature Granulocytes: 0.03 10*3/uL (ref 0.00–0.07)
Basophils Absolute: 0.1 10*3/uL (ref 0.0–0.1)
Basophils Relative: 1 %
Eosinophils Absolute: 0.1 10*3/uL (ref 0.0–0.5)
Eosinophils Relative: 1 %
HCT: 53.1 % — ABNORMAL HIGH (ref 39.0–52.0)
Hemoglobin: 17.3 g/dL — ABNORMAL HIGH (ref 13.0–17.0)
Immature Granulocytes: 0 %
Lymphocytes Relative: 24 %
Lymphs Abs: 1.9 10*3/uL (ref 0.7–4.0)
MCH: 28.5 pg (ref 26.0–34.0)
MCHC: 32.6 g/dL (ref 30.0–36.0)
MCV: 87.3 fL (ref 80.0–100.0)
Monocytes Absolute: 0.6 10*3/uL (ref 0.1–1.0)
Monocytes Relative: 7 %
Neutro Abs: 5.3 10*3/uL (ref 1.7–7.7)
Neutrophils Relative %: 67 %
Platelets: 170 10*3/uL (ref 150–400)
RBC: 6.08 MIL/uL — ABNORMAL HIGH (ref 4.22–5.81)
RDW: 17.1 % — ABNORMAL HIGH (ref 11.5–15.5)
WBC: 8 10*3/uL (ref 4.0–10.5)
nRBC: 0 % (ref 0.0–0.2)

## 2022-08-07 LAB — LACTATE DEHYDROGENASE: LDH: 110 U/L (ref 98–192)

## 2022-08-07 NOTE — Patient Instructions (Addendum)
[facility]  Discharge Instructions  You were seen and examined today by Dr. Delton Coombes. Dr. Delton Coombes is a hematologist, meaning that he specializes in blood abnormalities. Dr. Delton Coombes discussed your past medical history, family history of cancers/blood conditions and the events that led to you being here today.  You were referred to Dr. Delton Coombes due to elevated ferritin and red blood cells.   Dr. Delton Coombes has recommended additional labs today to assess further.  Dr. Delton Coombes has also discussed a low-dose CT scan. This is recommended due to your smoking history and is done on a regular basis.  Follow-up as scheduled.  Thank you for choosing [facility] to provide your oncology and hematology care.   To afford each patient quality time with our provider, please arrive at least 15 minutes before your scheduled appointment time. You may need to reschedule your appointment if you arrive late (10 or more minutes). Arriving late affects you and other patients whose appointments are after yours.  Also, if you miss three or more appointments without notifying the office, you may be dismissed from the clinic at the provider's discretion.    Again, thank you for choosing [facility].  Our hope is that these requests will decrease the amount of time that you wait before being seen by our physicians.   If you have a lab appointment with the [facility] please come in thru the Main Entrance and check in at the main information desk.           _____________________________________________________________  Should you have questions after your visit to [facility], please contact our office at [phone] and follow the prompts.  Our office hours are 8:00 a.m. to 4:30 p.m. Monday - Thursday and 8:00 a.m. to 2:30 p.m. Friday.  Please note that voicemails left after 4:00 p.m. may not be returned until the following  business day.  We are closed weekends and all major holidays.  You do have access to a nurse 24-7, just call the main number to the clinic [phone] and do not press any options, hold on the line and a nurse will answer the phone.    For prescription refill requests, have your pharmacy contact our office and allow 72 hours.    Masks are optional in the cancer centers. If you would like for your care team to wear a mask while they are taking care of you, please let them know. You may have one support person who is at least 63 years old accompany you for your appointments.

## 2022-08-07 NOTE — Progress Notes (Signed)
CONSULT NOTE  Patient Care Team: Dettinger, Fransisca Kaufmann, MD as PCP - General (Family Medicine) Lavera Guise, [facility] as Pharmacist (Family Medicine) Derek Jack, MD as Medical Oncologist (Hematology)  CHIEF COMPLAINTS/PURPOSE OF CONSULTATION:  Erythrocytosis.  HISTORY OF PRESENTING ILLNESS:  Francisco Allen 61 y.o. male is seen in consultation today at the request of Dr. Theador Hawthorne for further work-up and management of erythrocytosis.  CBC on 07/31/2022 showed RBC count elevated at 6.22, hemoglobin elevated at 17.8 and hematocrit 53.4.  White count and platelet count was normal.  Labs on 06/11/2022 also showed mildly elevated hematocrit and RBC count.  He had intermittent elevated RBC count, mostly in 2018.  He is not on diuretics or testosterone supplements.  He denies any history of sleep apnea.  Denies any aquagenic pruritus or vasomotor symptoms.  No prior history of thrombosis.  No headaches or vision changes.  Lives at home with his wife.  Is independent of ADLs and IADLs.  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  Smoked 1 pack/day since age 54 and quit for 12 years.   MEDICAL HISTORY:  Past Medical History:  Diagnosis Date   Anxiety    Arthritis    "all over my body I think" (09/23/2017)   Chronic kidney disease    Colon polyp    Diverticulitis    GERD (gastroesophageal reflux disease)    hx   Hyperlipidemia    Hypertension    Large bowel obstruction ([place]) 01/31/2021   Pre-diabetes     SURGICAL HISTORY: Past Surgical History:  Procedure Laterality Date   ARTHROSCOPY KNEE W/ DRILLING Left 10/25/2020   COLONOSCOPY  X ~ 2   "brother passed away w/colon cancer; I've probably had 4 colonoscopies done since he passed" (09/23/2017)   COLONOSCOPY W/ BIOPSIES AND POLYPECTOMY  X 2   COLONOSCOPY WITH PROPOFOL N/A 04/29/2021   Procedure: COLONOSCOPY WITH PROPOFOL;  Surgeon: Harvel Quale, MD;  [facility];  Service: Gastroenterology;  Laterality: N/A;   8:45   COLOSTOMY N/A 01/31/2021   Procedure: COLOSTOMY;  Surgeon: [name], MD;  Location: [facility];  Service: General;  Laterality: N/A;   COLOSTOMY REVERSAL N/A 02/02/2022   Procedure: COLOSTOMY REVERSAL with small bowel resection;  Surgeon: [name], MD;  Location: [facility];  Service: General;  Laterality: N/A;   CYST EXCISION  2004   Ro[place] of mouth   HEMORRHOID SURGERY     "lanced"   LEFT HEART CATH AND CORONARY ANGIOGRAPHY N/A 09/24/2017   Procedure: LEFT HEART CATH AND CORONARY ANGIOGRAPHY;  Surgeon: Jettie Booze, MD;  Location: [facility];  Service: Cardiovascular;  Laterality: N/A;   PARASTOMAL HERNIA REPAIR N/A 02/02/2022   Procedure: HERNIA REPAIR PARASTOMAL AND VENTRAL WITH PHASIX MESH;  Surgeon: [name], MD;  Location: [facility];  Service: General;  Laterality: N/A;   PARTIAL COLECTOMY N/A 01/31/2021   Procedure: PARTIAL COLECTOMY;  Surgeon: [name], MD;  Location: [facility];  Service: General;  Laterality: N/A;   POLYPECTOMY  04/29/2021   Procedure: POLYPECTOMY;  Surgeon: Harvel Quale, MD;  [facility];  Service: Gastroenterology;;   SHOULDER ARTHROSCOPY WITH ROTATOR CUFF REPAIR Right     SOCIAL HISTORY: Social History   Socioeconomic History   Marital status: Married    Spouse name: Not on file   Number of children: Not on file   Years of education: Not on file   Highest education level: Not  on file  Occupational History   Not on file  Tobacco Use   Smoking status: Every Day    Packs/day: 1.00    Years: 34.00    Total pack years: 34.00    Types: Cigarettes   Smokeless tobacco: Never  Vaping Use   Vaping Use: Never used  Substance and Sexual Activity   Alcohol use: No   Drug use: No   Sexual activity: Yes  Other Topics Concern   Not on file  Social History Narrative   Not on file   Social Determinants of Health   Financial Resource Strain: Low Risk  (03/03/2022)   Overall Financial  Resource Strain (CARDIA)    Difficulty of Paying Living Expenses: Not hard at all  Food Insecurity: No Food Insecurity (03/03/2022)   Hunger Vital Sign    Worried About Running Out of Food in the Last Year: Never true    Ran Out of Food in the Last Year: Never true  Transportation Needs: No Transportation Needs (03/03/2022)   PRAPARE - Hydrologist (Medical): No    Lack of Transportation (Non-Medical): No  Physical Activity: Not on file  Stress: Not on file  Social Connections: Not on file  Intimate Partner Violence: Not on file    FAMILY HISTORY: Family History  Problem Relation Age of Onset   Colon cancer Brother    Dementia Mother    CVA Father    Sudden death Sister 35       in her sleep, was told a heart attack   Heart disease Brother        CABG   Heart disease Brother        CABG   Heart disease Brother        CABG    ALLERGIES:  is allergic to bee venom and penicillins.  MEDICATIONS:  Current Outpatient Medications  Medication Sig Dispense Refill   ALPRAZolam (XANAX) 1 MG tablet Take 1 tablet by mouth 3 times daily as needed for anxiety. 90 tablet 2   carvedilol (COREG) 6.25 MG tablet Take 1 tablet by mouth 2 times daily with a meal in the morning and evening 180 tablet 3   diclofenac Sodium (VOLTAREN) 1 % GEL Apply 2 grams topically 4 times daily. 350 g 3   docusate sodium (COLACE) 100 MG capsule Take 1 capsule (100 mg total) by mouth 2 (two) times daily. 10 capsule 0   lisinopril (ZESTRIL) 2.5 MG tablet Take 1 tablet (2.5 mg total) by mouth daily. 90 tablet 0   Multiple Vitamins-Minerals (MENS 50+ MULTI VITAMIN/MIN PO) Take 1 tablet by mouth daily.     Omega-3 Fatty Acids (FISH OIL) 1000 MG CAPS Take 1,000 mg by mouth daily.     ondansetron (ZOFRAN) 4 MG tablet Take 1 tablet (4 mg total) by mouth every 6 (six) hours as needed for nausea. 20 tablet 0   oxyCODONE (OXY IR/ROXICODONE) 5 MG immediate release tablet Take 1 tablet (5 mg total)  by mouth every 6 (six) hours as needed for severe pain or breakthrough pain. 30 tablet 0   rosuvastatin (CRESTOR) 40 MG tablet Take 1 tablet by mouth daily. 90 tablet 3   Semaglutide, 1 MG/DOSE, 4 MG/3ML SOPN Inject 1 mg as directed once a week. 9 mL 3   tamsulosin (FLOMAX) 0.4 MG CAPS capsule Take 1 capsule (0.4 mg total) by mouth 2 (two) times daily. 120 capsule 3   tamsulosin (FLOMAX) 0.4 MG CAPS capsule  Take 1 capsule (0.4 mg total) by mouth 2 (two) times daily. 60 capsule 11   No current facility-administered medications for this visit.    REVIEW OF SYSTEMS:   Constitutional: Denies fevers, chills or abnormal night sweats Eyes: Denies blurriness of vision, double vision or watery eyes Ears, nose, mouth, throat, and face: Denies mucositis or sore throat Respiratory: Denies cough, dyspnea or wheezes Cardiovascular: Denies palpitation, chest discomfort or lower extremity swelling Gastrointestinal:  Denies nausea, heartburn or change in bowel habits.  Positive for diarrhea since surgery. Skin: Denies abnormal skin rashes Lymphatics: Denies new lymphadenopathy or easy bruising Neurological:Denies numbness, tingling or new weaknesses.  Positive for headaches. Behavioral/Psych: Mood is stable, no new changes  All other systems were reviewed with the patient and are negative.  PHYSICAL EXAMINATION: ECOG PERFORMANCE STATUS: 1 - Symptomatic but completely ambulatory  Vitals:   08/07/22 0812  BP: (!) 130/91  Pulse: 77  Resp: 16  Temp: 98 F (36.7 C)  SpO2: 98%   Filed Weights   08/07/22 0812  Weight: 229 lb 3.2 oz (104 kg)    GENERAL:alert, no distress and comfortable SKIN: skin color, texture, turgor are normal, no rashes or significant lesions EYES: normal, conjunctiva are pink and non-injected, sclera clear OROPHARYNX:no exudate, no erythema and lips, buccal mucosa, and tongue normal  NECK: supple, thyroid normal size, non-tender, without nodularity LYMPH:  no palpable  lymphadenopathy in the cervical, axillary or inguinal LUNGS: clear to auscultation and percussion with normal breathing effort HEART: regular rate & rhythm and no murmurs and no lower extremity edema ABDOMEN:abdomen soft, non-tender and normal bowel sounds Musculoskeletal:no cyanosis of digits and no clubbing  PSYCH: alert & oriented x 3 with fluent speech NEURO: no focal motor/sensory deficits  LABORATORY DATA:  I have reviewed the data as listed Recent Results (from the past 2160 hour(s))  Bayer DCA Hb A1c Waived     Status: Abnormal   Collection Time: 06/11/22  8:33 AM  Result Value Ref Range   HB A1C (BAYER DCA - WAIVED) 5.7 (H) 4.8 - 5.6 %    Comment:          Prediabetes: 5.7 - 6.4          Diabetes: >6.4          Glycemic control for adults with diabetes: <7.0   Lipid panel     Status: Abnormal   Collection Time: 06/11/22  8:35 AM  Result Value Ref Range   Cholesterol, Total 86 (L) 100 - 199 mg/dL   Triglycerides 188 (H) 0 - 149 mg/dL   HDL 28 (L) >39 mg/dL   VLDL Cholesterol Cal 30 5 - 40 mg/dL   LDL Chol Calc (NIH) 28 0 - 99 mg/dL   Chol/HDL Ratio 3.1 0.0 - 5.0 ratio    Comment:                                   T. Chol/HDL Ratio                                             Men  Women                               1/2 Avg.Risk  3.4    3.3                                   Avg.Risk  5.0    4.4                                2X Avg.Risk  9.6    7.1                                3X Avg.Risk 23.4   11.0   CMP14+EGFR     Status: Abnormal   Collection Time: 06/11/22  8:35 AM  Result Value Ref Range   Glucose 131 (H) 70 - 99 mg/dL   BUN 15 8 - 27 mg/dL   Creatinine, Ser 1.19 0.76 - 1.27 mg/dL   eGFR 69 >59 mL/min/1.73   BUN/Creatinine Ratio 13 10 - 24   Sodium 141 134 - 144 mmol/L   Potassium 4.9 3.5 - 5.2 mmol/L   Chloride 101 96 - 106 mmol/L   CO2 21 20 - 29 mmol/L   Calcium 10.1 8.6 - 10.2 mg/dL   Total Protein 7.4 6.0 - 8.5 g/dL   Albumin 4.6 3.9 - 4.9 g/dL    Globulin, Total 2.8 1.5 - 4.5 g/dL   Albumin/Globulin Ratio 1.6 1.2 - 2.2   Bilirubin Total 1.1 0.0 - 1.2 mg/dL   Alkaline Phosphatase 103 44 - 121 IU/L   AST 16 0 - 40 IU/L   ALT 15 0 - 44 IU/L  CBC with Differential/Platelet     Status: Abnormal   Collection Time: 06/11/22  8:35 AM  Result Value Ref Range   WBC 13.5 (H) 3.4 - 10.8 x10E3/uL   RBC 6.35 (H) 4.14 - 5.80 x10E6/uL   Hemoglobin 17.3 13.0 - 17.7 g/dL   Hematocrit 52.7 (H) 37.5 - 51.0 %   MCV 83 79 - 97 fL   MCH 27.2 26.6 - 33.0 pg   MCHC 32.8 31.5 - 35.7 g/dL   RDW 16.8 (H) 11.6 - 15.4 %   Platelets 171 150 - 450 x10E3/uL   Neutrophils 77 Not Estab. %   Lymphs 11 Not Estab. %   Monocytes 9 Not Estab. %   Eos 1 Not Estab. %   Basos 1 Not Estab. %   Neutrophils Absolute 10.5 (H) 1.4 - 7.0 x10E3/uL   Lymphocytes Absolute 1.5 0.7 - 3.1 x10E3/uL   Monocytes Absolute 1.1 (H) 0.1 - 0.9 x10E3/uL   EOS (ABSOLUTE) 0.2 0.0 - 0.4 x10E3/uL   Basophils Absolute 0.1 0.0 - 0.2 x10E3/uL   Immature Granulocytes 1 Not Estab. %   Immature Grans (Abs) 0.1 0.0 - 0.1 x10E3/uL  PSA, total and free     Status: None   Collection Time: 06/11/22  8:35 AM  Result Value Ref Range   Prostate Specific Ag, Serum 2.1 0.0 - 4.0 ng/mL    Comment: Roche ECLIA methodology. According to the American Urological Association, Serum PSA should decrease and remain at undetectable levels after radical prostatectomy. The AUA defines biochemical recurrence as an initial PSA value 0.2 ng/mL or greater followed by a subsequent confirmatory PSA value 0.2 ng/mL or greater. Values obtained with different assay methods or kits cannot be used interchangeably. Results cannot be interpreted as absolute evidence of the presence or  absence of malignant disease.    PSA, Free 0.39 N/A ng/mL    Comment: Roche ECLIA methodology.   PSA, Free Pct 18.6 %    Comment: The table below lists the probability of prostate cancer for men with non-suspicious DRE results and total  PSA between 4 and 10 ng/mL, by patient age Ricci Barker, [place], 834:1964).                   % Free PSA       50-64 yr        65-75 yr                   0.00-10.00%        56%             55%                  10.01-15.00%        24%             35%                  15.01-20.00%        17%             23%                  20.01-25.00%        10%             20%                       >25.00%         5%              9% Please note:  Catalona et al did not make specific               recommendations regarding the use of               percent free PSA for any other population               of men.   Lactate dehydrogenase     Status: None   Collection Time: 08/07/22  9:10 AM  Result Value Ref Range   LDH 110 98 - 192 U/L    Comment: Performed at [facility], [address] [place],  [address]  CBC with Differential     Status: Abnormal   Collection Time: 08/07/22  9:10 AM  Result Value Ref Range   WBC 8.0 4.0 - 10.5 K/uL   RBC 6.08 (H) 4.22 - 5.81 MIL/uL   Hemoglobin 17.3 (H) 13.0 - 17.0 g/dL   HCT 53.1 (H) 39.0 - 52.0 %   MCV 87.3 80.0 - 100.0 fL   MCH 28.5 26.0 - 34.0 pg   MCHC 32.6 30.0 - 36.0 g/dL   RDW 17.1 (H) 11.5 - 15.5 %   Platelets 170 150 - 400 K/uL   nRBC 0.0 0.0 - 0.2 %   Neutrophils Relative % 67 %   Neutro Abs 5.3 1.7 - 7.7 K/uL   Lymphocytes Relative 24 %   Lymphs Abs 1.9 0.7 - 4.0 K/uL   Monocytes Relative 7 %   Monocytes Absolute 0.6 0.1 - 1.0 K/uL   Eosinophils Relative 1 %   Eosinophils Absolute 0.1 0.0 - 0.5 K/uL   Basophils Relative 1 %   Basophils Absolute 0.1 0.0 - 0.1 K/uL  Immature Granulocytes 0 %   Abs Immature Granulocytes 0.03 0.00 - 0.07 K/uL    Comment: Performed at [facility], [address], [place], [place] [address]    RADIOGRAPHIC STUDIES: I have personally reviewed the radiological images as listed and agreed with the findings in the report. No results found.  ASSESSMENT:  1.  Erythrocytosis: - Patient seen at the  request of Dr. Theador Hawthorne for elevated RBC count and hemoglobin. - CBC (07/31/2022): Hb-17.8, RBC-6.22, HCT-53.4.  WBC and PLT normal. - CBC (06/11/2022): Hb-17.3, RBC-6.35, HCT-52.7. - No aquagenic pruritus/vasomotor symptoms/prior history of thrombosis. - No history of sleep apnea.  2.  Social/family history: - Lives at home with his wife.  There is independent of ADLs and IADLs. 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 - Current active smoker.  Smokes 1 pack/day starting at age 22, quit for 12 years. - No family history of polycythemia.  Brother had colon cancer.  Another brother had liver cancer and was a drinker.    PLAN:  1.  Erythrocytosis: - We discussed various etiologies including secondary flow cytosis and polycythemia vera. - In his case, I think secondary erythrocytosis is more likely given smoking history. - We will check EPO level and JAK2 [id number] to rule out clonal causes. - RTC 3 weeks for follow-up.  2.  Smoking history: - We talked about low-dose CT scan for lung cancer screening.  He is agreeable.  We will order it and discuss at next visit.   All questions were answered. The patient knows to call the clinic with any problems, questions or concerns.     Derek Jack, MD 08/07/22 1:50 PM

## 2022-08-08 LAB — ERYTHROPOIETIN: Erythropoietin: 16.2 m[IU]/mL (ref 2.6–18.5)

## 2022-08-13 ENCOUNTER — Other Ambulatory Visit (HOSPITAL_COMMUNITY): Payer: Self-pay

## 2022-08-14 LAB — JAK2 V617F RFX CALR/MPL/E12-15

## 2022-08-14 LAB — CALR +MPL + E12-E15  (REFLEX)

## 2022-08-19 ENCOUNTER — Other Ambulatory Visit (HOSPITAL_COMMUNITY): Payer: Self-pay

## 2022-08-21 ENCOUNTER — Encounter: Payer: No Typology Code available for payment source | Admitting: Hematology

## 2022-08-25 ENCOUNTER — Ambulatory Visit (HOSPITAL_COMMUNITY)
Admission: RE | Admit: 2022-08-25 | Discharge: 2022-08-25 | Disposition: A | Discharge status: Home / Self Care | Payer: No Typology Code available for payment source | Source: Ambulatory Visit | Attending: Hematology | Admitting: Hematology

## 2022-08-25 DIAGNOSIS — D751 Secondary polycythemia: Secondary | ICD-10-CM | POA: Insufficient documentation

## 2022-08-31 ENCOUNTER — Inpatient Hospital Stay: Payer: No Typology Code available for payment source | Attending: Hematology | Admitting: Medical Oncology

## 2022-08-31 DIAGNOSIS — R911 Solitary pulmonary nodule: Secondary | ICD-10-CM

## 2022-08-31 DIAGNOSIS — F17200 Nicotine dependence, unspecified, uncomplicated: Secondary | ICD-10-CM

## 2022-08-31 DIAGNOSIS — R918 Other nonspecific abnormal finding of lung field: Secondary | ICD-10-CM | POA: Diagnosis not present

## 2022-08-31 DIAGNOSIS — F17210 Nicotine dependence, cigarettes, uncomplicated: Secondary | ICD-10-CM | POA: Insufficient documentation

## 2022-08-31 DIAGNOSIS — D751 Secondary polycythemia: Secondary | ICD-10-CM | POA: Insufficient documentation

## 2022-08-31 NOTE — Progress Notes (Signed)
Patient request that notes be sent to his PCP from todays visit.  Patient wants a "facetime" visit today

## 2022-08-31 NOTE — Progress Notes (Signed)
Virtual Visit Progress Note  Francisco Allen are scheduled for a virtual visit with your provider today.    Just as we do with appointments in the office, we must obtain your consent to participate.  Your consent will be active for this visit and any virtual visit you may have with one of our providers in the next 365 days.    If you have a MyChart account, I can also send a copy of this consent to you electronically.  All virtual visits are billed to your insurance company just like a traditional visit in the office.  As this is a virtual visit, video technology does not allow for your provider to perform a traditional examination.  This may limit your provider's ability to fully assess your condition.  If your provider identifies any concerns that need to be evaluated in person or the need to arrange testing such as labs, EKG, etc, we will make arrangements to do so.    Although advances in technology are sophisticated, we cannot ensure that it will always work on either your end or our end.  If the connection with a video visit is poor, we may have to switch to a telephone visit.  With either a video or telephone visit, we are not always able to ensure that we have a secure connection.   I need to obtain your verbal consent now.   Are you willing to proceed with your visit today?   Francisco Allen has provided verbal consent on 08/31/2022 for a virtual visit (video or telephone).   Francisco Closs, PA-C 08/31/2022  4:15 PM    I connected with Francisco Allen on 08/31/22 at 10:30 AM EDT by telephone visit and verified that I am speaking with the correct person using two identifiers.   I discussed the limitations, risks, security and privacy concerns of performing an evaluation and management service by telemedicine and the availability of in-person appointments. I also discussed with the patient that there may be a patient responsible charge related to this service. The patient expressed  understanding and agreed to proceed.   Other persons participating in the visit and their role in the encounter: None   Patient's location: Home Provider's location: Clinic   Chief Complaint:   Erythrocytosis: He had his initial visit with the [place] [facility] at [facility] on 08/04/2022. He completed testing including Jak2 mutation labs, erythropoietin, LDH, CBC w/ diff, CALR +MPL+E12-E15, He also had a lung CT scan given his history of smoking. He presents today for his CT results and to discuss his lab results. He reports that since his last visit he has been doing well. He denies any bleeding or bruising episodes, any unintentional weight loss or night sweats. He is still smoking.     Patient Care Team: Dettinger, Fransisca Kaufmann, MD as PCP - General (Family Medicine) Lavera Guise, [facility] as Pharmacist (Family Medicine) Derek Jack, MD as Medical Oncologist (Hematology)   Name of the patient: Francisco Allen  [id number]  Allen   Date of visit: 08/31/22  Review of systems- ROS Denies new cough, fever, hemoptysis.   Allergies  Allergen Reactions   Bee Venom Anaphylaxis, Swelling and Other (See Comments)    Anaphylactic shock   Penicillins Anaphylaxis and Swelling    Airway closes up Has patient had a PCN reaction causing immediate rash, facial/tongue/throat swelling, SOB or lightheadedness with hypotension: Yes Has patient had a PCN reaction causing severe rash involving mucus membranes or  skin necrosis: Yes Has patient had a PCN reaction that required hospitalization: [name] Has patient had a PCN reaction occurring within the last 10 years: No If all of the above answers are "NO", then may proceed with Cephalosporin use.     Past Medical History:  Diagnosis Date   Anxiety    Arthritis    "all over my body I think" (09/23/2017)   Chronic kidney disease    Colon polyp    Diverticulitis    GERD (gastroesophageal reflux disease)    hx   Hyperlipidemia    Hypertension     Large bowel obstruction ([place]) 01/31/2021   Pre-diabetes     Past Surgical History:  Procedure Laterality Date   ARTHROSCOPY KNEE W/ DRILLING Left 10/25/2020   COLONOSCOPY  X ~ 2   "brother passed away w/colon cancer; I've probably had 4 colonoscopies done since he passed" (09/23/2017)   COLONOSCOPY W/ BIOPSIES AND POLYPECTOMY  X 2   COLONOSCOPY WITH PROPOFOL N/A 04/29/2021   Procedure: COLONOSCOPY WITH PROPOFOL;  Surgeon: Harvel Quale, MD;  [facility];  Service: Gastroenterology;  Laterality: N/A;  8:45   COLOSTOMY N/A 01/31/2021   Procedure: COLOSTOMY;  Surgeon: [name], MD;  Location: [facility];  Service: General;  Laterality: N/A;   COLOSTOMY REVERSAL N/A 02/02/2022   Procedure: COLOSTOMY REVERSAL with small bowel resection;  Surgeon: [name], MD;  Location: [facility];  Service: General;  Laterality: N/A;   CYST EXCISION  2004   Ro[place] of mouth   HEMORRHOID SURGERY     "lanced"   LEFT HEART CATH AND CORONARY ANGIOGRAPHY N/A 09/24/2017   Procedure: LEFT HEART CATH AND CORONARY ANGIOGRAPHY;  Surgeon: Jettie Booze, MD;  Location: [place] [facility];  Service: Cardiovascular;  Laterality: N/A;   PARASTOMAL HERNIA REPAIR N/A 02/02/2022   Procedure: HERNIA REPAIR PARASTOMAL AND VENTRAL WITH PHASIX MESH;  Surgeon: [name], MD;  Location: [facility];  Service: General;  Laterality: N/A;   PARTIAL COLECTOMY N/A 01/31/2021   Procedure: PARTIAL COLECTOMY;  Surgeon: [name], MD;  Location: [facility];  Service: General;  Laterality: N/A;   POLYPECTOMY  04/29/2021   Procedure: POLYPECTOMY;  Surgeon: Montez Morita, Quillian Quince, MD;  Location: [facility];  Service: Gastroenterology;;   SHOULDER ARTHROSCOPY WITH ROTATOR CUFF REPAIR Right     Social History   Socioeconomic History   Marital status: Married    Spouse name: Not on file   Number of children: Not on file   Years of education: Not on file   Highest education level: Not  on file  Occupational History   Not on file  Tobacco Use   Smoking status: Every Day    Packs/day: 1.00    Years: 34.00    Total pack years: 34.00    Types: Cigarettes   Smokeless tobacco: Never  Vaping Use   Vaping Use: Never used  Substance and Sexual Activity   Alcohol use: No   Drug use: No   Sexual activity: Yes  Other Topics Concern   Not on file  Social History Narrative   Not on file   Social Determinants of Health   Financial Resource Strain: Low Risk  (03/03/2022)   Overall Financial Resource Strain (CARDIA)    Difficulty of Paying Living Expenses: Not hard at all  Food Insecurity: No Food Insecurity (03/03/2022)   Hunger Vital Sign    Worried About [place] in the Last Year:  Never true    Ran Out of Food in the Last Year: Never true  Transportation Needs: No Transportation Needs (03/03/2022)   PRAPARE - Hydrologist (Medical): No    Lack of Transportation (Non-Medical): No  Physical Activity: Not on file  Stress: Not on file  Social Connections: Not on file  Intimate Partner Violence: Not on file    Immunization History  Administered Date(s) Administered   Hepatitis B 09/20/2013, 10/24/2013, 03/29/2014, 06/05/2014   Influenza,inj,Quad PF,6+ Mos 08/14/2016, 09/10/2017, 08/25/2018, 08/10/2019, 08/28/2020, 10/30/2021   Influenza-Unspecified 08/13/2014, 09/10/2017, 08/25/2018   Moderna SARS-COV2 Booster Vaccination 11/27/2020   Moderna Sars-Covid-2 Vaccination 02/08/2020, 03/07/2020    Family History  Problem Relation Age of Onset   Colon cancer Brother    Dementia Mother    CVA Father    Sudden death Sister 4       in her sleep, was told a heart attack   Heart disease Brother        CABG   Heart disease Brother        CABG   Heart disease Brother        CABG     Current Outpatient Medications:    ALPRAZolam (XANAX) 1 MG tablet, Take 1 tablet by mouth 3 times daily as needed for anxiety., Disp: 90 tablet,  Rfl: 2   carvedilol (COREG) 6.25 MG tablet, Take 1 tablet by mouth 2 times daily with a meal in the morning and evening, Disp: 180 tablet, Rfl: 3   diclofenac Sodium (VOLTAREN) 1 % GEL, Apply 2 grams topically 4 times daily., Disp: 350 g, Rfl: 3   docusate sodium (COLACE) 100 MG capsule, Take 1 capsule (100 mg total) by mouth 2 (two) times daily., Disp: 10 capsule, Rfl: 0   lisinopril (ZESTRIL) 2.5 MG tablet, Take 1 tablet (2.5 mg total) by mouth daily., Disp: 90 tablet, Rfl: 0   Multiple Vitamins-Minerals (MENS 50+ MULTI VITAMIN/MIN PO), Take 1 tablet by mouth daily., Disp: , Rfl:    Omega-3 Fatty Acids (FISH OIL) 1000 MG CAPS, Take 1,000 mg by mouth daily., Disp: , Rfl:    rosuvastatin (CRESTOR) 40 MG tablet, Take 1 tablet by mouth daily., Disp: 90 tablet, Rfl: 3   tamsulosin (FLOMAX) 0.4 MG CAPS capsule, Take 1 capsule (0.4 mg total) by mouth 2 (two) times daily., Disp: 60 capsule, Rfl: 11   tamsulosin (FLOMAX) 0.4 MG CAPS capsule, Take 1 capsule (0.4 mg total) by mouth 2 (two) times daily., Disp: 120 capsule, Rfl: 3   ondansetron (ZOFRAN) 4 MG tablet, Take 1 tablet (4 mg total) by mouth every 6 (six) hours as needed for nausea. (Patient not taking: Reported on 08/31/2022), Disp: 20 tablet, Rfl: 0   oxyCODONE (OXY IR/ROXICODONE) 5 MG immediate release tablet, Take 1 tablet (5 mg total) by mouth every 6 (six) hours as needed for severe pain or breakthrough pain. (Patient not taking: Reported on 08/31/2022), Disp: 30 tablet, Rfl: 0   Semaglutide, 1 MG/DOSE, 4 MG/3ML SOPN, Inject 1 mg as directed once a week. (Patient not taking: Reported on 08/31/2022), Disp: 9 mL, Rfl: 3  Physical exam: Exam limited due to telemedicine  Physical Exam   Assessment and plan- Patient is a 61 y.o. male    Visit Diagnosis 1. Erythrocytosis   2. Current smoker   3. Lung nodule     Erythrocytosis:  Chronic in nature. Originally thought to be secondary to smoking. We reviewed his recent labs from 08/06/2022 which  did not reveal any genetic condition though to cause his lab abnormality. LDH normal, Erythropoietin normal. Hgb was 17.3. Normal WBC of 8.0, HCT 53.1, normal platelets of 170. Given these labs smoking as the cause is highly likely. We also reviewed his CT scan that he had which showed a very small 3 mm nodule. Given his smoking history I advised him that smoking cessation would be his best option at the moment while we following monitoring/surveillance guidelines for the nodule. Hoping that the nodule will resolve or be stable in 12 months and that the smoking cessation will allow his erythrocytosis to resolve. He is agreeable and wishes to try weaning down on cigarettes.   RTC 3 months labs, MD 12 months CT chest lung cancer screening   Patient expressed understanding and was in agreement with this plan. He also understands that He can call clinic at any time with any questions, concerns, or complaints.   I discussed the assessment and treatment plan with the patient. The patient was provided an opportunity to ask questions and all were answered. The patient agreed with the plan and demonstrated an understanding of the instructions.   The patient was advised to call back or seek an in-person evaluation if the symptoms worsen or if the condition fails to improve as anticipated.    I spent 15 minutes on this telephone encounter.  Video connection was lost at >50% of the duration of the visit, at which time the remainder of the visit was completed via audio only.  Thank you for allowing me to participate in the care of this very pleasant patient.   [place] [facility]  CC:

## 2022-09-01 ENCOUNTER — Other Ambulatory Visit: Payer: No Typology Code available for payment source

## 2022-09-01 DIAGNOSIS — E782 Mixed hyperlipidemia: Secondary | ICD-10-CM

## 2022-09-01 DIAGNOSIS — I10 Essential (primary) hypertension: Secondary | ICD-10-CM

## 2022-09-01 DIAGNOSIS — N183 Chronic kidney disease, stage 3 unspecified: Secondary | ICD-10-CM

## 2022-09-01 DIAGNOSIS — R809 Proteinuria, unspecified: Secondary | ICD-10-CM

## 2022-09-01 LAB — CBC WITH DIFFERENTIAL/PLATELET
Basophils Absolute: 0.1 10*3/uL (ref 0.0–0.2)
Basos: 1 %
EOS (ABSOLUTE): 0.2 10*3/uL (ref 0.0–0.4)
Eos: 2 %
Hematocrit: 52.6 % — ABNORMAL HIGH (ref 37.5–51.0)
Hemoglobin: 17.4 g/dL (ref 13.0–17.7)
Immature Grans (Abs): 0.1 10*3/uL (ref 0.0–0.1)
Immature Granulocytes: 1 %
Lymphocytes Absolute: 2 10*3/uL (ref 0.7–3.1)
Lymphs: 24 %
MCH: 28.7 pg (ref 26.6–33.0)
MCHC: 33.1 g/dL (ref 31.5–35.7)
MCV: 87 fL (ref 79–97)
Monocytes Absolute: 0.6 10*3/uL (ref 0.1–0.9)
Monocytes: 7 %
Neutrophils Absolute: 5.3 10*3/uL (ref 1.4–7.0)
Neutrophils: 65 %
Platelets: 173 10*3/uL (ref 150–450)
RBC: 6.06 x10E6/uL — ABNORMAL HIGH (ref 4.14–5.80)
RDW: 15.1 % (ref 11.6–15.4)
WBC: 8.1 10*3/uL (ref 3.4–10.8)

## 2022-09-01 LAB — LIPID PANEL
Chol/HDL Ratio: 3.5 ratio (ref 0.0–5.0)
Cholesterol, Total: 95 mg/dL — ABNORMAL LOW (ref 100–199)
HDL: 27 mg/dL — ABNORMAL LOW (ref >39)
LDL Chol Calc (NIH): 34 mg/dL (ref 0–99)
Triglycerides: 218 mg/dL — ABNORMAL HIGH (ref 0–149)
VLDL Cholesterol Cal: 34 mg/dL (ref 5–40)

## 2022-09-01 LAB — BAYER DCA HB A1C WAIVED: HB A1C (BAYER DCA - WAIVED): 5.9 % — ABNORMAL HIGH (ref 4.8–5.6)

## 2022-09-01 LAB — CMP14+EGFR
ALT: 16 IU/L (ref 0–44)
AST: 17 IU/L (ref 0–40)
Albumin/Globulin Ratio: 1.7 (ref 1.2–2.2)
Albumin: 4.5 g/dL (ref 3.9–4.9)
Alkaline Phosphatase: 93 IU/L (ref 44–121)
BUN/Creatinine Ratio: 14 (ref 10–24)
BUN: 15 mg/dL (ref 8–27)
Bilirubin Total: 0.8 mg/dL (ref 0.0–1.2)
CO2: 23 mmol/L (ref 20–29)
Calcium: 9.6 mg/dL (ref 8.6–10.2)
Chloride: 103 mmol/L (ref 96–106)
Creatinine, Ser: 1.09 mg/dL (ref 0.76–1.27)
Globulin, Total: 2.7 g/dL (ref 1.5–4.5)
Glucose: 139 mg/dL — ABNORMAL HIGH (ref 70–99)
Potassium: 4.4 mmol/L (ref 3.5–5.2)
Sodium: 142 mmol/L (ref 134–144)
Total Protein: 7.2 g/dL (ref 6.0–8.5)
eGFR: 76 mL/min/{1.73_m2} (ref >59)

## 2022-09-03 ENCOUNTER — Encounter: Payer: Self-pay | Admitting: Family Medicine

## 2022-09-03 ENCOUNTER — Ambulatory Visit (INDEPENDENT_AMBULATORY_CARE_PROVIDER_SITE_OTHER): Payer: No Typology Code available for payment source | Admitting: Family Medicine

## 2022-09-03 ENCOUNTER — Other Ambulatory Visit: Payer: Self-pay | Admitting: *Deleted

## 2022-09-03 VITALS — BP 142/97 | HR 73 | Temp 98.4°F | Ht 70.0 in

## 2022-09-03 DIAGNOSIS — I10 Essential (primary) hypertension: Secondary | ICD-10-CM | POA: Diagnosis not present

## 2022-09-03 DIAGNOSIS — Z23 Encounter for immunization: Secondary | ICD-10-CM | POA: Diagnosis not present

## 2022-09-03 DIAGNOSIS — D751 Secondary polycythemia: Secondary | ICD-10-CM

## 2022-09-03 DIAGNOSIS — E782 Mixed hyperlipidemia: Secondary | ICD-10-CM

## 2022-09-03 DIAGNOSIS — R809 Proteinuria, unspecified: Secondary | ICD-10-CM | POA: Diagnosis not present

## 2022-09-03 DIAGNOSIS — E1129 Type 2 diabetes mellitus with other diabetic kidney complication: Secondary | ICD-10-CM

## 2022-09-03 NOTE — Progress Notes (Signed)
BP (!) 142/97   Pulse 73   Temp 98.4 F (36.9 C)   Ht _0  (1.778 m)   SpO2 97%   BMI 32.89 kg/m    Subjective:   Patient ID: Francisco Allen, male    DOB: 17-Jun-1961, 61 y.o.   MRN: [id number]  HPI: Francisco Allen is a 60 y.o. male presenting on 09/03/2022 for Medical Management of Chronic Issues, Hyperlipidemia, Hypertension, and Diabetes   HPI Type 2 diabetes mellitus Patient comes in today for recheck of his diabetes. Patient has been currently taking Ozempic. Patient is currently on an ACE inhibitor/ARB. Patient has seen an ophthalmologist this year. Patient denies any issues with their feet. The symptom started onset as an adult hyperlipidemia and hypertension and CKD ARE RELATED TO DM   Hyperlipidemia Patient is coming in for recheck of his hyperlipidemia. The patient is currently taking Crestor. They deny any issues with myalgias or history of liver damage from it. They deny any focal numbness or weakness or chest pain.   Hypertension and CKD Patient is currently on carvedilol and fish oil, and their blood pressure today is 142/97. Patient denies any lightheadedness or dizziness. Patient denies headaches, blurred vision, chest pains, shortness of breath, or weakness. Denies any side effects from medication and is content with current medication.    Relevant past medical, surgical, family and social history reviewed and updated as indicated. Interim medical history since our last visit reviewed. Allergies and medications reviewed and updated.  Review of Systems  Constitutional:  Negative for chills and fever.  Eyes:  Negative for visual disturbance.  Respiratory:  Negative for shortness of breath and wheezing.   Cardiovascular:  Negative for chest pain and leg swelling.  Musculoskeletal:  Negative for back pain and gait problem.  Skin:  Negative for rash.  Neurological:  Negative for dizziness, weakness and light-headedness.  All other systems reviewed and are  negative.   Per HPI unless specifically indicated above   Allergies as of 09/03/2022       Reactions   Bee Venom Anaphylaxis, Swelling, Other (See Comments)   Anaphylactic shock   Penicillins Anaphylaxis, Swelling   Airway closes up Has patient had a PCN reaction causing immediate rash, facial/tongue/throat swelling, SOB or lightheadedness with hypotension: Yes Has patient had a PCN reaction causing severe rash involving mucus membranes or skin necrosis: Yes Has patient had a PCN reaction that required hospitalization: [name] Has patient had a PCN reaction occurring within the last 10 years: No If all of the above answers are "NO", then may proceed with Cephalosporin use.        Medication List        Accurate as of September 03, 2022 11:47 AM. If you have any questions, ask your nurse or doctor.          ALPRAZolam 1 MG tablet Commonly known as: XANAX Take 1 tablet by mouth 3 times daily as needed for anxiety.   carvedilol 6.25 MG tablet Commonly known as: COREG Take 1 tablet by mouth 2 times daily with a meal in the morning and evening   diclofenac Sodium 1 % Gel Commonly known as: Voltaren Apply 2 grams topically 4 times daily.   docusate sodium 100 MG capsule Commonly known as: COLACE Take 1 capsule (100 mg total) by mouth 2 (two) times daily.   Fish Oil 1000 MG Caps Take 1,000 mg by mouth daily.   lisinopril 2.5 MG tablet Commonly known as: ZESTRIL Take 1  tablet (2.5 mg total) by mouth daily.   MENS 50+ MULTI VITAMIN/MIN PO Take 1 tablet by mouth daily.   ondansetron 4 MG tablet Commonly known as: ZOFRAN Take 1 tablet (4 mg total) by mouth every 6 (six) hours as needed for nausea.   oxyCODONE 5 MG immediate release tablet Commonly known as: Oxy IR/ROXICODONE Take 1 tablet (5 mg total) by mouth every 6 (six) hours as needed for severe pain or breakthrough pain.   Ozempic (1 MG/DOSE) 4 MG/3ML Sopn Generic drug: Semaglutide (1 MG/DOSE) Inject 1 mg as  directed once a week.   rosuvastatin 40 MG tablet Commonly known as: CRESTOR Take 1 tablet by mouth daily.   tamsulosin 0.4 MG Caps capsule Commonly known as: FLOMAX Take 1 capsule (0.4 mg total) by mouth 2 (two) times daily.   tamsulosin 0.4 MG Caps capsule Commonly known as: FLOMAX Take 1 capsule (0.4 mg total) by mouth 2 (two) times daily.         Objective:   BP (!) 142/97   Pulse 73   Temp 98.4 F (36.9 C)   Ht _0  (1.778 m)   SpO2 97%   BMI 32.89 kg/m   Wt Readings from Last 3 Encounters:  08/07/22 229 lb 3.2 oz (104 kg)  06/11/22 226 lb (102.5 kg)  04/21/22 222 lb (100.7 kg)    Physical Exam Vitals and nursing note reviewed.  Constitutional:      General: He is not in acute distress.    Appearance: He is well-developed. He is not diaphoretic.  Eyes:     General: No scleral icterus.    Conjunctiva/sclera: Conjunctivae normal.  Neck:     Thyroid: No thyromegaly.  Cardiovascular:     Rate and Rhythm: Normal rate and regular rhythm.     Heart sounds: Normal heart sounds. No murmur heard. Pulmonary:     Effort: Pulmonary effort is normal. No respiratory distress.     Breath sounds: Normal breath sounds. No wheezing.  Musculoskeletal:        General: Normal range of motion.     Cervical back: Neck supple.  Lymphadenopathy:     Cervical: No cervical adenopathy.  Skin:    General: Skin is warm and dry.     Findings: No rash.  Neurological:     Mental Status: He is alert and oriented to person, place, and time.     Coordination: Coordination normal.  Psychiatric:        Behavior: Behavior normal.     Results for orders placed or performed in visit on 09/01/22  CBC with Differential/Platelet  Result Value Ref Range   WBC 8.1 3.4 - 10.8 x10E3/uL   RBC 6.06 (H) 4.14 - 5.80 x10E6/uL   Hemoglobin 17.4 13.0 - 17.7 g/dL   Hematocrit 52.6 (H) 37.5 - 51.0 %   MCV 87 79 - 97 fL   MCH 28.7 26.6 - 33.0 pg   MCHC 33.1 31.5 - 35.7 g/dL   RDW 15.1 11.6 -  15.4 %   Platelets 173 150 - 450 x10E3/uL   Neutrophils 65 Not Estab. %   Lymphs 24 Not Estab. %   Monocytes 7 Not Estab. %   Eos 2 Not Estab. %   Basos 1 Not Estab. %   Neutrophils Absolute 5.3 1.4 - 7.0 x10E3/uL   Lymphocytes Absolute 2.0 0.7 - 3.1 x10E3/uL   Monocytes Absolute 0.6 0.1 - 0.9 x10E3/uL   EOS (ABSOLUTE) 0.2 0.0 - 0.4 x10E3/uL  Basophils Absolute 0.1 0.0 - 0.2 x10E3/uL   Immature Granulocytes 1 Not Estab. %   Immature Grans (Abs) 0.1 0.0 - 0.1 x10E3/uL  CMP14+EGFR  Result Value Ref Range   Glucose 139 (H) 70 - 99 mg/dL   BUN 15 8 - 27 mg/dL   Creatinine, Ser 1.09 0.76 - 1.27 mg/dL   eGFR 76 >59 mL/min/1.73   BUN/Creatinine Ratio 14 10 - 24   Sodium 142 134 - 144 mmol/L   Potassium 4.4 3.5 - 5.2 mmol/L   Chloride 103 96 - 106 mmol/L   CO2 23 20 - 29 mmol/L   Calcium 9.6 8.6 - 10.2 mg/dL   Total Protein 7.2 6.0 - 8.5 g/dL   Albumin 4.5 3.9 - 4.9 g/dL   Globulin, Total 2.7 1.5 - 4.5 g/dL   Albumin/Globulin Ratio 1.7 1.2 - 2.2   Bilirubin Total 0.8 0.0 - 1.2 mg/dL   Alkaline Phosphatase 93 44 - 121 IU/L   AST 17 0 - 40 IU/L   ALT 16 0 - 44 IU/L  Lipid panel  Result Value Ref Range   Cholesterol, Total 95 (L) 100 - 199 mg/dL   Triglycerides 218 (H) 0 - 149 mg/dL   HDL 27 (L) >39 mg/dL   VLDL Cholesterol Cal 34 5 - 40 mg/dL   LDL Chol Calc (NIH) 34 0 - 99 mg/dL   Chol/HDL Ratio 3.5 0.0 - 5.0 ratio  Bayer DCA Hb A1c Waived  Result Value Ref Range   HB A1C (BAYER DCA - WAIVED) 5.9 (H) 4.8 - 5.6 %    Assessment & Plan:   Problem List Items Addressed This Visit       Cardiovascular and Mediastinum   HTN (hypertension)     Endocrine   Type 2 diabetes mellitus with microalbuminuria (HCC)     Other   Hyperlipidemia - Primary    BP mildly elevated, recommended that he keep an eye on it.  A1c looks good and blood work except for triglycerides looks really good. Follow up plan: Return in about 3 months (around 12/04/2022), or if symptoms worsen or fail to  improve, for Diabetes recheck.  Counseling provided for all of the vaccine components No orders of the defined types were placed in this encounter.   Caryl Pina, MD [facility] 09/03/2022, 11:47 AM

## 2022-09-07 ENCOUNTER — Other Ambulatory Visit (HOSPITAL_COMMUNITY): Payer: Self-pay

## 2022-09-11 ENCOUNTER — Ambulatory Visit: Payer: No Typology Code available for payment source | Admitting: Family Medicine

## 2022-09-15 ENCOUNTER — Telehealth: Payer: Self-pay | Admitting: Family Medicine

## 2022-09-16 ENCOUNTER — Other Ambulatory Visit (HOSPITAL_COMMUNITY): Payer: Self-pay

## 2022-10-09 ENCOUNTER — Other Ambulatory Visit (HOSPITAL_COMMUNITY): Payer: Self-pay

## 2022-10-09 ENCOUNTER — Other Ambulatory Visit: Payer: Self-pay | Admitting: Family Medicine

## 2022-10-09 DIAGNOSIS — F411 Generalized anxiety disorder: Secondary | ICD-10-CM

## 2022-10-09 MED ORDER — LISINOPRIL 2.5 MG PO TABS
2.5000 mg | ORAL_TABLET | Freq: Every day | ORAL | 0 refills | Status: DC
  Filled 2022-10-09: qty 90, 90d supply, fill #0

## 2022-10-09 MED ORDER — ALPRAZOLAM 1 MG PO TABS
1.0000 mg | ORAL_TABLET | Freq: Three times a day (TID) | ORAL | 0 refills | Status: DC | PRN
  Filled 2022-10-09: qty 90, 30d supply, fill #0

## 2022-10-10 ENCOUNTER — Other Ambulatory Visit (HOSPITAL_COMMUNITY): Payer: Self-pay

## 2022-11-05 ENCOUNTER — Other Ambulatory Visit: Payer: Self-pay | Admitting: Family Medicine

## 2022-11-05 ENCOUNTER — Other Ambulatory Visit: Payer: Self-pay

## 2022-11-05 ENCOUNTER — Other Ambulatory Visit (HOSPITAL_COMMUNITY): Payer: Self-pay

## 2022-11-05 DIAGNOSIS — F411 Generalized anxiety disorder: Secondary | ICD-10-CM

## 2022-11-05 MED ORDER — ALPRAZOLAM 1 MG PO TABS
1.0000 mg | ORAL_TABLET | Freq: Three times a day (TID) | ORAL | 0 refills | Status: DC | PRN
  Filled 2022-11-05: qty 90, 30d supply, fill #0

## 2022-11-06 ENCOUNTER — Other Ambulatory Visit: Payer: Self-pay

## 2022-11-06 ENCOUNTER — Other Ambulatory Visit (HOSPITAL_COMMUNITY): Payer: Self-pay

## 2022-11-07 ENCOUNTER — Other Ambulatory Visit (HOSPITAL_COMMUNITY): Payer: Self-pay

## 2022-11-10 ENCOUNTER — Other Ambulatory Visit: Payer: Self-pay | Admitting: Family Medicine

## 2022-11-10 ENCOUNTER — Other Ambulatory Visit (HOSPITAL_COMMUNITY): Payer: Self-pay

## 2022-11-10 DIAGNOSIS — M1712 Unilateral primary osteoarthritis, left knee: Secondary | ICD-10-CM

## 2022-11-11 ENCOUNTER — Other Ambulatory Visit (HOSPITAL_COMMUNITY): Payer: Self-pay

## 2022-11-11 MED ORDER — DICLOFENAC SODIUM 1 % EX GEL
2.0000 g | Freq: Four times a day (QID) | CUTANEOUS | 0 refills | Status: DC
  Filled 2022-11-11: qty 400, 50d supply, fill #0
  Filled 2022-11-12 – 2022-11-19 (×3): qty 300, 38d supply, fill #0

## 2022-11-12 ENCOUNTER — Other Ambulatory Visit: Payer: Self-pay

## 2022-11-12 ENCOUNTER — Other Ambulatory Visit (HOSPITAL_COMMUNITY): Payer: Self-pay

## 2022-11-17 ENCOUNTER — Other Ambulatory Visit: Payer: Self-pay

## 2022-11-17 ENCOUNTER — Other Ambulatory Visit (HOSPITAL_COMMUNITY): Payer: Self-pay

## 2022-11-17 MED ORDER — CARVEDILOL 6.25 MG PO TABS
6.2500 mg | ORAL_TABLET | Freq: Two times a day (BID) | ORAL | 3 refills | Status: DC
  Filled 2022-11-17: qty 180, 90d supply, fill #0
  Filled 2023-02-24: qty 180, 90d supply, fill #1
  Filled 2023-05-22: qty 180, 90d supply, fill #2
  Filled 2023-08-19: qty 180, 90d supply, fill #3

## 2022-11-19 ENCOUNTER — Other Ambulatory Visit (HOSPITAL_COMMUNITY): Payer: Self-pay

## 2022-11-19 ENCOUNTER — Other Ambulatory Visit: Payer: Self-pay

## 2022-11-30 ENCOUNTER — Other Ambulatory Visit: Payer: No Typology Code available for payment source

## 2022-11-30 ENCOUNTER — Ambulatory Visit: Payer: No Typology Code available for payment source | Admitting: Medical Oncology

## 2022-12-01 ENCOUNTER — Other Ambulatory Visit: Payer: No Typology Code available for payment source

## 2022-12-02 ENCOUNTER — Other Ambulatory Visit: Payer: No Typology Code available for payment source

## 2022-12-03 ENCOUNTER — Other Ambulatory Visit: Payer: 59

## 2022-12-03 ENCOUNTER — Other Ambulatory Visit (HOSPITAL_COMMUNITY): Payer: Self-pay

## 2022-12-03 DIAGNOSIS — I10 Essential (primary) hypertension: Secondary | ICD-10-CM | POA: Diagnosis not present

## 2022-12-03 DIAGNOSIS — E1122 Type 2 diabetes mellitus with diabetic chronic kidney disease: Secondary | ICD-10-CM

## 2022-12-03 DIAGNOSIS — N183 Chronic kidney disease, stage 3 unspecified: Secondary | ICD-10-CM | POA: Diagnosis not present

## 2022-12-03 DIAGNOSIS — E1129 Type 2 diabetes mellitus with other diabetic kidney complication: Secondary | ICD-10-CM | POA: Diagnosis not present

## 2022-12-03 DIAGNOSIS — R809 Proteinuria, unspecified: Secondary | ICD-10-CM | POA: Diagnosis not present

## 2022-12-03 DIAGNOSIS — D751 Secondary polycythemia: Secondary | ICD-10-CM | POA: Diagnosis not present

## 2022-12-03 DIAGNOSIS — E782 Mixed hyperlipidemia: Secondary | ICD-10-CM

## 2022-12-03 LAB — LIPID PANEL

## 2022-12-03 LAB — BAYER DCA HB A1C WAIVED: HB A1C (BAYER DCA - WAIVED): 6.6 % — ABNORMAL HIGH (ref 4.8–5.6)

## 2022-12-04 LAB — LIPID PANEL
Chol/HDL Ratio: 4.2 ratio (ref 0.0–5.0)
Cholesterol, Total: 106 mg/dL (ref 100–199)
HDL: 25 mg/dL — ABNORMAL LOW (ref >39)
LDL Chol Calc (NIH): 34 mg/dL (ref 0–99)
Triglycerides: 314 mg/dL — ABNORMAL HIGH (ref 0–149)
VLDL Cholesterol Cal: 47 mg/dL — ABNORMAL HIGH (ref 5–40)

## 2022-12-04 LAB — CBC WITH DIFFERENTIAL/PLATELET
Basophils Absolute: 0.1 10*3/uL (ref 0.0–0.2)
Basos: 1 %
EOS (ABSOLUTE): 0.1 10*3/uL (ref 0.0–0.4)
Eos: 2 %
Hematocrit: 56.7 % — ABNORMAL HIGH (ref 37.5–51.0)
Hemoglobin: 18.4 g/dL — ABNORMAL HIGH (ref 13.0–17.7)
Immature Grans (Abs): 0.1 10*3/uL (ref 0.0–0.1)
Immature Granulocytes: 1 %
Lymphocytes Absolute: 1.8 10*3/uL (ref 0.7–3.1)
Lymphs: 22 %
MCH: 28.8 pg (ref 26.6–33.0)
MCHC: 32.5 g/dL (ref 31.5–35.7)
MCV: 89 fL (ref 79–97)
Monocytes Absolute: 0.6 10*3/uL (ref 0.1–0.9)
Monocytes: 7 %
Neutrophils Absolute: 5.7 10*3/uL (ref 1.4–7.0)
Neutrophils: 67 %
Platelets: 151 10*3/uL (ref 150–450)
RBC: 6.39 x10E6/uL — ABNORMAL HIGH (ref 4.14–5.80)
RDW: 15.1 % (ref 11.6–15.4)
WBC: 8.4 10*3/uL (ref 3.4–10.8)

## 2022-12-04 LAB — CMP14+EGFR
ALT: 21 IU/L (ref 0–44)
AST: 21 IU/L (ref 0–40)
Albumin/Globulin Ratio: 1.6 (ref 1.2–2.2)
Albumin: 4.2 g/dL (ref 3.9–4.9)
Alkaline Phosphatase: 74 IU/L (ref 44–121)
BUN/Creatinine Ratio: 11 (ref 10–24)
BUN: 12 mg/dL (ref 8–27)
Bilirubin Total: 0.9 mg/dL (ref 0.0–1.2)
CO2: 22 mmol/L (ref 20–29)
Calcium: 9.3 mg/dL (ref 8.6–10.2)
Chloride: 102 mmol/L (ref 96–106)
Creatinine, Ser: 1.12 mg/dL (ref 0.76–1.27)
Globulin, Total: 2.7 g/dL (ref 1.5–4.5)
Glucose: 167 mg/dL — ABNORMAL HIGH (ref 70–99)
Potassium: 4.9 mmol/L (ref 3.5–5.2)
Sodium: 140 mmol/L (ref 134–144)
Total Protein: 6.9 g/dL (ref 6.0–8.5)
eGFR: 74 mL/min/{1.73_m2} (ref >59)

## 2022-12-04 LAB — LACTATE DEHYDROGENASE: LDH: 189 IU/L (ref 121–224)

## 2022-12-08 ENCOUNTER — Ambulatory Visit: Payer: No Typology Code available for payment source | Admitting: Physician Assistant

## 2022-12-08 DIAGNOSIS — H25813 Combined forms of age-related cataract, bilateral: Secondary | ICD-10-CM | POA: Diagnosis not present

## 2022-12-08 DIAGNOSIS — E119 Type 2 diabetes mellitus without complications: Secondary | ICD-10-CM | POA: Diagnosis not present

## 2022-12-08 DIAGNOSIS — D3132 Benign neoplasm of left choroid: Secondary | ICD-10-CM | POA: Diagnosis not present

## 2022-12-08 LAB — HM DIABETES EYE EXAM

## 2022-12-09 ENCOUNTER — Ambulatory Visit (INDEPENDENT_AMBULATORY_CARE_PROVIDER_SITE_OTHER): Payer: 59 | Admitting: Family Medicine

## 2022-12-09 ENCOUNTER — Other Ambulatory Visit (HOSPITAL_COMMUNITY): Payer: Self-pay

## 2022-12-09 ENCOUNTER — Encounter: Payer: Self-pay | Admitting: Family Medicine

## 2022-12-09 VITALS — BP 131/77 | HR 77 | Ht 70.0 in | Wt 243.0 lb

## 2022-12-09 DIAGNOSIS — N183 Chronic kidney disease, stage 3 unspecified: Secondary | ICD-10-CM

## 2022-12-09 DIAGNOSIS — E782 Mixed hyperlipidemia: Secondary | ICD-10-CM

## 2022-12-09 DIAGNOSIS — E1122 Type 2 diabetes mellitus with diabetic chronic kidney disease: Secondary | ICD-10-CM | POA: Diagnosis not present

## 2022-12-09 DIAGNOSIS — M1712 Unilateral primary osteoarthritis, left knee: Secondary | ICD-10-CM | POA: Diagnosis not present

## 2022-12-09 DIAGNOSIS — E785 Hyperlipidemia, unspecified: Secondary | ICD-10-CM | POA: Diagnosis not present

## 2022-12-09 DIAGNOSIS — E1129 Type 2 diabetes mellitus with other diabetic kidney complication: Secondary | ICD-10-CM | POA: Diagnosis not present

## 2022-12-09 DIAGNOSIS — F411 Generalized anxiety disorder: Secondary | ICD-10-CM | POA: Diagnosis not present

## 2022-12-09 DIAGNOSIS — R809 Proteinuria, unspecified: Secondary | ICD-10-CM | POA: Diagnosis not present

## 2022-12-09 DIAGNOSIS — I10 Essential (primary) hypertension: Secondary | ICD-10-CM

## 2022-12-09 MED ORDER — ROSUVASTATIN CALCIUM 40 MG PO TABS
40.0000 mg | ORAL_TABLET | Freq: Every day | ORAL | 3 refills | Status: DC
  Filled 2022-12-09 – 2023-02-17 (×2): qty 90, 90d supply, fill #0
  Filled 2023-05-22: qty 90, 90d supply, fill #1
  Filled 2023-08-19: qty 90, 90d supply, fill #2
  Filled 2023-11-16: qty 90, 90d supply, fill #3

## 2022-12-09 MED ORDER — LISINOPRIL 2.5 MG PO TABS
2.5000 mg | ORAL_TABLET | Freq: Every day | ORAL | 0 refills | Status: DC
  Filled 2022-12-09 – 2023-01-04 (×2): qty 90, 90d supply, fill #0

## 2022-12-09 MED ORDER — DICLOFENAC SODIUM 1 % EX GEL
2.0000 g | Freq: Four times a day (QID) | CUTANEOUS | 0 refills | Status: DC
  Filled 2022-12-09: qty 300, 38d supply, fill #0
  Filled 2023-01-04: qty 200, 25d supply, fill #0
  Filled 2023-01-25: qty 100, 13d supply, fill #1

## 2022-12-09 MED ORDER — SEMAGLUTIDE (1 MG/DOSE) 4 MG/3ML ~~LOC~~ SOPN
1.0000 mg | PEN_INJECTOR | SUBCUTANEOUS | 3 refills | Status: DC
  Filled 2022-12-09: qty 9, 84d supply, fill #0
  Filled 2023-01-04: qty 3, 28d supply, fill #0
  Filled 2023-01-25: qty 3, 28d supply, fill #1
  Filled 2023-02-24: qty 3, 28d supply, fill #2
  Filled 2023-03-25: qty 3, 28d supply, fill #3
  Filled 2023-04-28: qty 3, 28d supply, fill #4
  Filled 2023-05-22: qty 3, 28d supply, fill #5
  Filled 2023-06-23: qty 3, 28d supply, fill #6
  Filled 2023-07-14: qty 3, 28d supply, fill #7
  Filled 2023-08-15: qty 3, 28d supply, fill #8
  Filled 2023-09-15: qty 3, 28d supply, fill #9

## 2022-12-09 MED ORDER — ALPRAZOLAM 1 MG PO TABS
1.0000 mg | ORAL_TABLET | Freq: Three times a day (TID) | ORAL | 2 refills | Status: DC | PRN
  Filled 2022-12-09: qty 90, 30d supply, fill #0
  Filled 2023-01-08: qty 90, 30d supply, fill #1
  Filled 2023-02-08: qty 90, 30d supply, fill #2

## 2022-12-09 NOTE — Progress Notes (Signed)
BP 131/77   Pulse 77   Ht '5\' 10"'$  (1.778 m)   Wt 243 lb (110.2 kg)   SpO2 98%   BMI 34.87 kg/m    Subjective:   Patient ID: Francisco Allen, male    DOB: 11-13-1960, 62 y.o.   MRN: [id number]  HPI: Francisco Allen is a 62 y.o. male presenting on 12/09/2022 for Medical Management of Chronic Issues, Hyperlipidemia, and Depression   HPI Type 2 diabetes mellitus Patient comes in today for recheck of his diabetes. Patient has been currently taking Ozempic. Patient is currently on an ACE inhibitor/ARB. Patient has not seen an ophthalmologist this year. Patient denies any issues with their feet. The symptom started onset as an adult hypertension and hyperlipidemia and CKD ARE RELATED TO DM   Hypertension Patient is currently on carvedilol and lisinopril, and their blood pressure today is 131/77. Patient denies any lightheadedness or dizziness. Patient denies headaches, blurred vision, chest pains, shortness of breath, or weakness. Denies any side effects from medication and is content with current medication.   Hyperlipidemia Patient is coming in for recheck of his hyperlipidemia. The patient is currently taking fish oil and Crestor. They deny any issues with myalgias or history of liver damage from it. They deny any focal numbness or weakness or chest pain.   Anxiety and depression recheck Current rx-alprazolam 1 mg 3 times daily as needed # meds rx-90 Effectiveness of current meds-works well Adverse reactions form meds-none  Pill count performed-No Last drug screen -03/12/2022 ( high risk q74m moderate risk q674mlow risk yearly ) Urine drug screen today- No [place]  If yes were their any concerning findings? -None  No flowsheet data found.   Controlled substance contract signed on: 03/12/2022 n  Relevant past medical, surgical, family and social history reviewed and updated as indicated. Interim medical history since our last visit reviewed. Allergies and  medications reviewed and updated.  Review of Systems  Constitutional:  Negative for chills and fever.  Eyes:  Negative for visual disturbance.  Respiratory:  Negative for shortness of breath and wheezing.   Cardiovascular:  Negative for chest pain and leg swelling.  Musculoskeletal:  Positive for arthralgias. Negative for back pain and gait problem.  Skin:  Negative for rash.  Neurological:  Negative for dizziness, weakness and light-headedness.  All other systems reviewed and are negative.   Per HPI unless specifically indicated above   Allergies as of 12/09/2022       Reactions   Bee Venom Anaphylaxis, Swelling, Other (See Comments)   Anaphylactic shock   Penicillins Anaphylaxis, Swelling   Airway closes up Has patient had a PCN reaction causing immediate rash, facial/tongue/throat swelling, SOB or lightheadedness with hypotension: Yes Has patient had a PCN reaction causing severe rash involving mucus membranes or skin necrosis: Yes Has patient had a PCN reaction that required hospitalization: YeAlfred Levinsas patient had a PCN reaction occurring within the last 10 years: No If all of the above answers are "NO", then may proceed with Cephalosporin use.        Medication List        Accurate as of December 09, 2022 10:28 AM. If you have any questions, ask your nurse or doctor.          STOP taking these medications    oxyCODONE 5 MG immediate release tablet Commonly known as: Oxy IR/ROXICODONE Stopped by: JoWorthy RancherMD       TAKE these medications  ALPRAZolam 1 MG tablet Commonly known as: XANAX Take 1 tablet by mouth 3 times daily as needed for anxiety.   carvedilol 6.25 MG tablet Commonly known as: COREG Take 1 tablet (6.25 mg total) by mouth in the morning and in the evening with a meal   diclofenac Sodium 1 % Gel Commonly known as: Voltaren Apply 2 grams topically 4 times daily.   docusate sodium 100 MG capsule Commonly known as: COLACE Take 1  capsule (100 mg total) by mouth 2 (two) times daily.   Fish Oil 1000 MG Caps Take 1,000 mg by mouth daily.   lisinopril 2.5 MG tablet Commonly known as: ZESTRIL Take 1 tablet (2.5 mg total) by mouth daily.   MENS 50+ MULTI VITAMIN/MIN PO Take 1 tablet by mouth daily.   ondansetron 4 MG tablet Commonly known as: ZOFRAN Take 1 tablet (4 mg total) by mouth every 6 (six) hours as needed for nausea.   rosuvastatin 40 MG tablet Commonly known as: CRESTOR Take 1 tablet by mouth daily.   Semaglutide (1 MG/DOSE) 4 MG/3ML Sopn Inject 1 mg as directed once a week.   tamsulosin 0.4 MG Caps capsule Commonly known as: FLOMAX Take 1 capsule (0.4 mg total) by mouth 2 (two) times daily. What changed: Another medication with the same name was removed. Continue taking this medication, and follow the directions you see here. Changed by: Fransisca Kaufmann Ashleymarie Granderson, MD         Objective:   BP 131/77   Pulse 77   Ht '5\' 10"'$  (1.778 m)   Wt 243 lb (110.2 kg)   SpO2 98%   BMI 34.87 kg/m   Wt Readings from Last 3 Encounters:  12/09/22 243 lb (110.2 kg)  08/07/22 229 lb 3.2 oz (104 kg)  06/11/22 226 lb (102.5 kg)    Physical Exam Vitals and nursing note reviewed.  Constitutional:      General: He is not in acute distress.    Appearance: He is well-developed. He is not diaphoretic.  Eyes:     General: No scleral icterus.    Conjunctiva/sclera: Conjunctivae normal.  Neck:     Thyroid: No thyromegaly.  Cardiovascular:     Rate and Rhythm: Normal rate and regular rhythm.     Heart sounds: Normal heart sounds. No murmur heard. Pulmonary:     Effort: Pulmonary effort is normal. No respiratory distress.     Breath sounds: Normal breath sounds. No wheezing.  Musculoskeletal:        General: No swelling. Normal range of motion.     Cervical back: Neck supple.  Lymphadenopathy:     Cervical: No cervical adenopathy.  Skin:    General: Skin is warm and dry.     Findings: No rash.   Neurological:     Mental Status: He is alert and oriented to person, place, and time.     Coordination: Coordination normal.  Psychiatric:        Behavior: Behavior normal.     Results for orders placed or performed in visit on 12/03/22  CMP14+EGFR  Result Value Ref Range   Glucose 167 (H) 70 - 99 mg/dL   BUN 12 8 - 27 mg/dL   Creatinine, Ser 1.12 0.76 - 1.27 mg/dL   eGFR 74 >59 mL/min/1.73   BUN/Creatinine Ratio 11 10 - 24   Sodium 140 134 - 144 mmol/L   Potassium 4.9 3.5 - 5.2 mmol/L   Chloride 102 96 - 106 mmol/L   CO2 22  20 - 29 mmol/L   Calcium 9.3 8.6 - 10.2 mg/dL   Total Protein 6.9 6.0 - 8.5 g/dL   Albumin 4.2 3.9 - 4.9 g/dL   Globulin, Total 2.7 1.5 - 4.5 g/dL   Albumin/Globulin Ratio 1.6 1.2 - 2.2   Bilirubin Total 0.9 0.0 - 1.2 mg/dL   Alkaline Phosphatase 74 44 - 121 IU/L   AST 21 0 - 40 IU/L   ALT 21 0 - 44 IU/L  CBC with Differential/Platelet  Result Value Ref Range   WBC 8.4 3.4 - 10.8 x10E3/uL   RBC 6.39 (H) 4.14 - 5.80 x10E6/uL   Hemoglobin 18.4 (H) 13.0 - 17.7 g/dL   Hematocrit 56.7 (H) 37.5 - 51.0 %   MCV 89 79 - 97 fL   MCH 28.8 26.6 - 33.0 pg   MCHC 32.5 31.5 - 35.7 g/dL   RDW 15.1 11.6 - 15.4 %   Platelets 151 150 - 450 x10E3/uL   Neutrophils 67 Not Estab. %   Lymphs 22 Not Estab. %   Monocytes 7 Not Estab. %   Eos 2 Not Estab. %   Basos 1 Not Estab. %   Neutrophils Absolute 5.7 1.4 - 7.0 x10E3/uL   Lymphocytes Absolute 1.8 0.7 - 3.1 x10E3/uL   Monocytes Absolute 0.6 0.1 - 0.9 x10E3/uL   EOS (ABSOLUTE) 0.1 0.0 - 0.4 x10E3/uL   Basophils Absolute 0.1 0.0 - 0.2 x10E3/uL   Immature Granulocytes 1 Not Estab. %   Immature Grans (Abs) 0.1 0.0 - 0.1 x10E3/uL  Lipid panel  Result Value Ref Range   Cholesterol, Total 106 100 - 199 mg/dL   Triglycerides 314 (H) 0 - 149 mg/dL   HDL 25 (L) >39 mg/dL   VLDL Cholesterol Cal 47 (H) 5 - 40 mg/dL   LDL Chol Calc (NIH) 34 0 - 99 mg/dL   Chol/HDL Ratio 4.2 0.0 - 5.0 ratio  Bayer DCA Hb A1c Waived   Result Value Ref Range   HB A1C (BAYER DCA - WAIVED) 6.6 (H) 4.8 - 5.6 %  Lactate dehydrogenase  Result Value Ref Range   LDH 189 121 - 224 IU/L    Assessment & Plan:   Problem List Items Addressed This Visit       Cardiovascular and Mediastinum   HTN (hypertension) - Primary   Relevant Medications   lisinopril (ZESTRIL) 2.5 MG tablet   rosuvastatin (CRESTOR) 40 MG tablet     Endocrine   Type 2 diabetes mellitus with microalbuminuria (HCC)   Relevant Medications   lisinopril (ZESTRIL) 2.5 MG tablet   rosuvastatin (CRESTOR) 40 MG tablet   Semaglutide, 1 MG/DOSE, 4 MG/3ML SOPN   Other Relevant Orders   Microalbumin / creatinine urine ratio   CKD stage 3 due to type 2 diabetes mellitus (HCC)   Relevant Medications   lisinopril (ZESTRIL) 2.5 MG tablet   rosuvastatin (CRESTOR) 40 MG tablet   Semaglutide, 1 MG/DOSE, 4 MG/3ML SOPN   Other Relevant Orders   Microalbumin / creatinine urine ratio     Other   GAD (generalized anxiety disorder)   Relevant Medications   Semaglutide, 1 MG/DOSE, 4 MG/3ML SOPN   ALPRAZolam (XANAX) 1 MG tablet   Hyperlipidemia   Relevant Medications   lisinopril (ZESTRIL) 2.5 MG tablet   rosuvastatin (CRESTOR) 40 MG tablet   Semaglutide, 1 MG/DOSE, 4 MG/3ML SOPN   Other Visit Diagnoses     Primary osteoarthritis of left knee       Relevant Medications  diclofenac Sodium (VOLTAREN) 1 % GEL       Triglycerides and A1c are slightly up but the rest of his blood work looks good.  Focus on diet for both but also recommended that he increase his fish oils to 4 tablets a day Follow up plan: Return in about 3 months (around 03/09/2023), or if symptoms worsen or fail to improve, for Diabetes recheck.  Counseling provided for all of the vaccine components Orders Placed This Encounter  Procedures   Microalbumin / creatinine urine ratio    Caryl Pina, MD [facility] 12/09/2022, 10:28 AM

## 2022-12-09 NOTE — Progress Notes (Unsigned)
[address], [place] [address]   CLINIC:  Medical Oncology/Hematology  PCP:  Dettinger, Fransisca Kaufmann, MD [place] [phone]   REASON FOR VISIT:  Follow-up for erythrocytosis  CURRENT THERAPY: Under workup  INTERVAL HISTORY:   Francisco Allen 62 y.o. male returns for routine follow-up of his erythrocytosis.  He was seen for initial consultation by Dr. Delton Coombes on 08/07/2022, but canceled his follow-up visits until today's appointment.  At today's visit, he reports feeling ***.  No recent hospitalizations, surgeries, or changes in baseline health status. ***He does not have any history of DVT, PE, MI, or CVA. ***Patient continues to smoke about 1 PPD cigarettes daily. *** He has not made any recent blood donations. *** He denies any aquagenic pruritus, strokelike symptoms, vision changes, severe headaches, tinnitus, paresthesias, or interval thrombotic events.  *** Other cardiac risk factors  He has ***% energy and ***% appetite. He endorses that he is maintaining a stable weight.   ASSESSMENT & PLAN:  1.  Erythrocytosis - Patient seen at the request of Dr. Theador Hawthorne for elevated RBC count and hemoglobin. - He has had intermittent erythrocytosis since at least 2015 - No history of DVT, PE, or MI. - He is a current every day smoker (1 PPD since age 31, previously quit for about 12 years before starting again) - No aquagenic pruritus/vasomotor symptoms/prior history of thrombosis. - No history of sleep apnea.  He does not take testosterone supplements or diuretics. - Hematology workup (08/07/2022): JAK2, CALR, MPL, Exon 12-15 were NEGATIVE Normal erythropoietin 16.2.  Normal LDH. - Blood donation *** - Most recent CBC/D (12/03/2022): Hgb 18.4/hematocrit 56.7, otherwise normal CBC - DIFFERENTIAL DIAGNOSIS favors secondary polycythemia/erythrocytosis secondary to tobacco use. - Discussed with patient that main treatment of  secondary polycythemia is focused at underlying cause, in this case smoking.  We discussed the importance of smoking cessation. - We have discussed that the main risk of secondary polycythemia is thrombosis including DVT, PE, MI, or CVA.  Patient has additional cardiac risk factors including ***. - Phlebotomy can be used in the setting of secondary polycythemia for relief of severe symptoms (strokelike symptoms, severe recurrent headaches, severe fatigue) or if hematocrit is > 54 - PLAN: Recommend smoking cessation.  *** This was discussed extensively during patient visit and he was provided with materials and resources. - Recommend aspirin 81 mg daily to reduce the risk of thrombotic event in the setting of secondary polycythemia and other cardiac risk factors.  *** - Recommend voluntary blood donation versus therapeutic phlebotomy x 2 within the next 3 months. - Same-day labs (CBC) + OFFICE visit in 3 months ***  2.  Tobacco use - Patient is current everyday smoker, 1 PPD since age 42, but had previously quit for about 12 years before starting again - Patient is agreeable to annual LDCT scan for as long as he qualifies for lung cancer screening - LDCT chest (08/25/2022): Lung RADS 2, benign appearance/behavior - PLAN: Continue annual LDCT chest for LCS, next due October 2024  3.   Social/family history: - Lives at home with his wife.  There is independent of ADLs and IADLs. 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 - Current active smoker.  Smokes 1 pack/day starting at age 41, quit for 12 years. - No family history of polycythemia.  Brother had colon cancer.  Another brother had liver cancer and was a drinker.  PLAN SUMMARY: >> *** >> *** >> ***  [facility] [place] **   You were seen today by Tarri Abernethy PA-C for your ***.    *** ***  *** ***  LABS: Return in ***   OTHER TESTS:  ***  MEDICATIONS: ***  FOLLOW-UP APPOINTMENT: ***     REVIEW OF SYSTEMS: ***  Review of Systems - Oncology   PHYSICAL EXAM:  ECOG PERFORMANCE STATUS: {CHL ONC ECOG JA:[id number]} *** There were no vitals filed for this visit. There were no vitals filed for this visit. Physical Exam  PAST MEDICAL/SURGICAL HISTORY:  Past Medical History:  Diagnosis Date   Anxiety    Arthritis    "all over my body I think" (09/23/2017)   Chronic kidney disease    Colon polyp    Diverticulitis    GERD (gastroesophageal reflux disease)    hx   Hyperlipidemia    Hypertension    Large bowel obstruction ([place]) 01/31/2021   Pre-diabetes    Past Surgical History:  Procedure Laterality Date   ARTHROSCOPY KNEE W/ DRILLING Left 10/25/2020   COLONOSCOPY  X ~ 2   "brother passed away w/colon cancer; I've probably had 4 colonoscopies done since he passed" (09/23/2017)   COLONOSCOPY W/ BIOPSIES AND POLYPECTOMY  X 2   COLONOSCOPY WITH PROPOFOL N/A 04/29/2021   Procedure: COLONOSCOPY WITH PROPOFOL;  Surgeon: Harvel Quale, MD;  [facility];  Service: Gastroenterology;  Laterality: N/A;  8:45   COLOSTOMY N/A 01/31/2021   Procedure: COLOSTOMY;  Surgeon: [name], MD;  Location: [facility];  Service: General;  Laterality: N/A;   COLOSTOMY REVERSAL N/A 02/02/2022   Procedure: COLOSTOMY REVERSAL with small bowel resection;  Surgeon: [name], MD;  Location: [facility];  Service: General;  Laterality: N/A;   CYST EXCISION  2004   Roof of mouth   HEMORRHOID SURGERY     "lanced"   LEFT HEART CATH AND CORONARY ANGIOGRAPHY N/A 09/24/2017   Procedure: LEFT HEART CATH AND CORONARY ANGIOGRAPHY;  Surgeon: Jettie Booze, MD;  Location: [facility];  Service: Cardiovascular;  Laterality: N/A;   PARASTOMAL HERNIA REPAIR N/A 02/02/2022   Procedure: HERNIA REPAIR PARASTOMAL AND VENTRAL WITH PHASIX MESH;  Surgeon: [name], MD;  Location: [facility];  Service: General;   Laterality: N/A;   PARTIAL COLECTOMY N/A 01/31/2021   Procedure: PARTIAL COLECTOMY;  Surgeon: [name], MD;  Location: [facility];  Service: General;  Laterality: N/A;   POLYPECTOMY  04/29/2021   Procedure: POLYPECTOMY;  Surgeon: Harvel Quale, MD;  [facility];  Service: Gastroenterology;;   SHOULDER ARTHROSCOPY WITH ROTATOR CUFF REPAIR Right     SOCIAL HISTORY:  Social History   Socioeconomic History   Marital status: Married    Spouse name: Not on file   Number of children: Not on file   Years of education: Not on file   Highest education level: Not on file  Occupational History   Not on file  Tobacco Use   Smoking status: Every Day    Packs/day: 1.00    Years: 34.00    Total pack years: 34.00    Types: Cigarettes   Smokeless tobacco: Never  Vaping Use   Vaping Use: Never used  Substance and Sexual Activity   Alcohol use: No   Drug use: No   Sexual activity: Yes  Other Topics Concern   Not on file  Social History Narrative   Not on  file   Social Determinants of Health   Financial Resource Strain: Low Risk  (03/03/2022)   Overall Financial Resource Strain (CARDIA)    Difficulty of Paying Living Expenses: Not hard at all  Food Insecurity: No Food Insecurity (03/03/2022)   Hunger Vital Sign    Worried About Running Out of Food in the Last Year: Never true    Ran Out of Food in the Last Year: Never true  Transportation Needs: No Transportation Needs (03/03/2022)   PRAPARE - Hydrologist (Medical): No    Lack of Transportation (Non-Medical): No  Physical Activity: Not on file  Stress: Not on file  Social Connections: Not on file  Intimate Partner Violence: Not on file    FAMILY HISTORY:  Family History  Problem Relation Age of Onset   Colon cancer Brother    Dementia Mother    CVA Father    Sudden death Sister 74       in her sleep, was told a heart attack   Heart disease Brother        CABG   Heart  disease Brother        CABG   Heart disease Brother        CABG    CURRENT MEDICATIONS:  Outpatient Encounter Medications as of 12/10/2022  Medication Sig   ALPRAZolam (XANAX) 1 MG tablet Take 1 tablet (1 mg total) by mouth 3 (three) times daily as needed for anxiety.   carvedilol (COREG) 6.25 MG tablet Take 1 tablet (6.25 mg total) by mouth in the morning and in the evening with a meal   diclofenac Sodium (VOLTAREN) 1 % GEL Apply 2 grams topically 4 (four) times daily.   docusate sodium (COLACE) 100 MG capsule Take 1 capsule (100 mg total) by mouth 2 (two) times daily.   lisinopril (ZESTRIL) 2.5 MG tablet Take 1 tablet (2.5 mg total) by mouth daily.   Multiple Vitamins-Minerals (MENS 50+ MULTI VITAMIN/MIN PO) Take 1 tablet by mouth daily.   Omega-3 Fatty Acids (FISH OIL) 1000 MG CAPS Take 1,000 mg by mouth daily.   ondansetron (ZOFRAN) 4 MG tablet Take 1 tablet (4 mg total) by mouth every 6 (six) hours as needed for nausea.   rosuvastatin (CRESTOR) 40 MG tablet Take 1 tablet (40 mg total) by mouth daily.   Semaglutide, 1 MG/DOSE, 4 MG/3ML SOPN Inject 1 mg as directed once a week.   tamsulosin (FLOMAX) 0.4 MG CAPS capsule Take 1 capsule (0.4 mg total) by mouth 2 (two) times daily.   [DISCONTINUED] ALPRAZolam (XANAX) 1 MG tablet Take 1 tablet by mouth 3 times daily as needed for anxiety.   [DISCONTINUED] diclofenac Sodium (VOLTAREN) 1 % GEL Apply 2 grams topically 4 times daily.   [DISCONTINUED] lisinopril (ZESTRIL) 2.5 MG tablet Take 1 tablet (2.5 mg total) by mouth daily.   [DISCONTINUED] rosuvastatin (CRESTOR) 40 MG tablet Take 1 tablet by mouth daily.   [DISCONTINUED] Semaglutide, 1 MG/DOSE, 4 MG/3ML SOPN Inject 1 mg as directed once a week.   No facility-administered encounter medications on file as of 12/10/2022.    ALLERGIES:  Allergies  Allergen Reactions   Bee Venom Anaphylaxis, Swelling and Other (See Comments)    Anaphylactic shock   Penicillins Anaphylaxis and Swelling     Airway closes up Has patient had a PCN reaction causing immediate rash, facial/tongue/throat swelling, SOB or lightheadedness with hypotension: Yes Has patient had a PCN reaction causing severe rash involving mucus membranes or  skin necrosis: Yes Has patient had a PCN reaction that required hospitalization: [name] Has patient had a PCN reaction occurring within the last 10 years: No If all of the above answers are "NO", then may proceed with Cephalosporin use.     LABORATORY DATA:  I have reviewed the labs as listed.  CBC    Component Value Date/Time   WBC 8.4 12/03/2022 0901   WBC 8.0 08/07/2022 0910   RBC 6.39 (H) 12/03/2022 0901   RBC 6.08 (H) 08/07/2022 0910   HGB 18.4 (H) 12/03/2022 0901   HCT 56.7 (H) 12/03/2022 0901   PLT 151 12/03/2022 0901   MCV 89 12/03/2022 0901   MCH 28.8 12/03/2022 0901   MCH 28.5 08/07/2022 0910   MCHC 32.5 12/03/2022 0901   MCHC 32.6 08/07/2022 0910   RDW 15.1 12/03/2022 0901   LYMPHSABS 1.8 12/03/2022 0901   MONOABS 0.6 08/07/2022 0910   EOSABS 0.1 12/03/2022 0901   BASOSABS 0.1 12/03/2022 0901      Latest Ref Rng & Units 12/03/2022    9:01 AM 09/01/2022    8:39 AM 06/11/2022    8:35 AM  CMP  Glucose 70 - 99 mg/dL 167  139  131   BUN 8 - 27 mg/dL '12  15  15   '$ Creatinine 0.76 - 1.27 mg/dL 1.12  1.09  1.19   Sodium 134 - 144 mmol/L 140  142  141   Potassium 3.5 - 5.2 mmol/L 4.9  4.4  4.9   Chloride 96 - 106 mmol/L 102  103  101   CO2 20 - 29 mmol/L '22  23  21   '$ Calcium 8.6 - 10.2 mg/dL 9.3  9.6  10.1   Total Protein 6.0 - 8.5 g/dL 6.9  7.2  7.4   Total Bilirubin 0.0 - 1.2 mg/dL 0.9  0.8  1.1   Alkaline Phos 44 - 121 IU/L 74  93  103   AST 0 - 40 IU/L '21  17  16   '$ ALT 0 - 44 IU/L '21  16  15     '$ DIAGNOSTIC IMAGING:  I have independently reviewed the relevant imaging and discussed with the patient.   WRAP UP:  All questions were answered. The patient knows to call the clinic with any problems, questions or concerns.  Medical decision  making: ***  Time spent on visit: I spent *** minutes counseling the patient face to face. The total time spent in the appointment was *** minutes and more than 50% was on counseling.  Harriett Rush, PA-C  ***

## 2022-12-10 ENCOUNTER — Other Ambulatory Visit (HOSPITAL_COMMUNITY): Payer: Self-pay

## 2022-12-10 ENCOUNTER — Inpatient Hospital Stay: Payer: 59 | Attending: Physician Assistant | Admitting: Physician Assistant

## 2022-12-10 VITALS — BP 139/87 | HR 76 | Temp 98.4°F | Resp 18 | Ht 70.0 in | Wt 243.3 lb

## 2022-12-10 DIAGNOSIS — D751 Secondary polycythemia: Secondary | ICD-10-CM | POA: Insufficient documentation

## 2022-12-10 DIAGNOSIS — F17210 Nicotine dependence, cigarettes, uncomplicated: Secondary | ICD-10-CM | POA: Insufficient documentation

## 2022-12-10 NOTE — Patient Instructions (Signed)
[facility] **   You were seen today by Tarri Abernethy PA-C for your elevated hemoglobin ("erythrocytosis").    ERYTHROCYTOSIS Your elevated hemoglobin is due to your tobacco use. The best treatment for elevated hemoglobin is to stop smoking. Until that time, I would like you to donate blood once every 2 months to try to keep your hematocrit <54%. Your elevated hemoglobin places you at increased risk of blood clots, heart attack, and stroke. You should start taking aspirin 81 mg daily due to your high red blood cells in the setting of other cardiac risk factors such as hypertension, hyperlipidemia, obesity, and diabetes.  HELP TO QUIT SMOKING: Look up "[place]" for additional smoking cessation resources and free classes. See the attached handout for additional tips on smoking cessation.  FOLLOW-UP APPOINTMENT: You do not need to follow-up with the hematology clinic anymore.  You can follow-up with your primary care doctor for ongoing monitoring of your blood counts, per your request.  However, if you have any future issues you can be referred back to [place] at that time.  ** Thank you for trusting me with your healthcare!  I strive to provide all of my patients with quality care at each visit.  If you receive a survey for this visit, I would be so grateful to you for taking the time to provide feedback.  Thank you in advance!  ~ Fareeda Downard                   Dr. Derek Jack   &   Tarri Abernethy, PA-C   - - - - - - - - - - - - - - - - - -    Thank you for choosing [facility] to provide your oncology and hematology care.  To afford each patient quality time with our provider, please arrive at least 15 minutes before your scheduled appointment time.   If you have a lab appointment with the [facility] please come in thru the Main Entrance and check in at  the main information desk.  You need to re-schedule your appointment should you arrive 10 or more minutes late.  We strive to give you quality time with our providers, and arriving late affects you and other patients whose appointments are after yours.  Also, if you no show three or more times for appointments you may be dismissed from the clinic at the providers discretion.     Again, thank you for choosing [facility].  Our hope is that these requests will decrease the amount of time that you wait before being seen by our physicians.       _____________________________________________________________  Should you have questions after your visit to [facility], please contact our office at [phone] and follow the prompts.  Our office hours are 8:00 a.m. and 4:30 p.m. Monday - Friday.  Please note that voicemails left after 4:00 p.m. may not be returned until the following business day.  We are closed weekends and major holidays.  You do have access to a nurse 24-7, just call the main number to the clinic [phone] and do not press any options, hold on the line and a nurse will answer the phone.    For prescription refill requests, have your pharmacy contact our office and allow 72 hours.

## 2022-12-11 ENCOUNTER — Other Ambulatory Visit (HOSPITAL_COMMUNITY): Payer: Self-pay

## 2022-12-11 LAB — MICROALBUMIN / CREATININE URINE RATIO
Creatinine, Urine: 89.1 mg/dL
Microalb/Creat Ratio: 523 mg/g creat — ABNORMAL HIGH (ref 0–29)
Microalbumin, Urine: 465.9 ug/mL

## 2022-12-11 MED ORDER — TAMSULOSIN HCL 0.4 MG PO CAPS
0.4000 mg | ORAL_CAPSULE | Freq: Two times a day (BID) | ORAL | 11 refills | Status: DC
  Filled 2022-12-11 – 2022-12-15 (×2): qty 60, 30d supply, fill #0
  Filled 2023-01-18: qty 60, 30d supply, fill #1
  Filled 2023-02-17: qty 60, 30d supply, fill #2
  Filled 2023-03-18: qty 60, 30d supply, fill #3
  Filled 2023-04-13: qty 60, 30d supply, fill #4
  Filled 2023-05-22: qty 60, 30d supply, fill #5
  Filled 2023-06-22: qty 60, 30d supply, fill #6
  Filled 2023-07-24: qty 60, 30d supply, fill #7
  Filled 2023-08-21: qty 60, 30d supply, fill #8
  Filled 2023-09-23: qty 60, 30d supply, fill #9
  Filled 2023-10-24: qty 60, 30d supply, fill #10
  Filled 2023-11-19: qty 60, 30d supply, fill #11

## 2022-12-15 ENCOUNTER — Other Ambulatory Visit (HOSPITAL_COMMUNITY): Payer: Self-pay

## 2022-12-16 ENCOUNTER — Other Ambulatory Visit (HOSPITAL_COMMUNITY): Payer: Self-pay

## 2022-12-17 ENCOUNTER — Other Ambulatory Visit (HOSPITAL_COMMUNITY): Payer: Self-pay

## 2023-01-04 ENCOUNTER — Other Ambulatory Visit (HOSPITAL_COMMUNITY): Payer: Self-pay

## 2023-01-04 ENCOUNTER — Other Ambulatory Visit: Payer: Self-pay

## 2023-01-08 ENCOUNTER — Other Ambulatory Visit (HOSPITAL_BASED_OUTPATIENT_CLINIC_OR_DEPARTMENT_OTHER): Payer: Self-pay

## 2023-01-11 ENCOUNTER — Other Ambulatory Visit: Payer: Self-pay

## 2023-01-18 ENCOUNTER — Other Ambulatory Visit (HOSPITAL_COMMUNITY): Payer: Self-pay

## 2023-01-25 ENCOUNTER — Other Ambulatory Visit: Payer: Self-pay

## 2023-02-04 ENCOUNTER — Other Ambulatory Visit (HOSPITAL_COMMUNITY): Payer: Self-pay

## 2023-02-08 ENCOUNTER — Other Ambulatory Visit: Payer: Self-pay | Admitting: Family Medicine

## 2023-02-08 ENCOUNTER — Other Ambulatory Visit: Payer: Self-pay

## 2023-02-08 ENCOUNTER — Other Ambulatory Visit (HOSPITAL_COMMUNITY): Payer: Self-pay

## 2023-02-08 MED ORDER — LISINOPRIL 2.5 MG PO TABS
2.5000 mg | ORAL_TABLET | Freq: Every day | ORAL | 0 refills | Status: DC
  Filled 2023-02-08 – 2023-04-13 (×3): qty 90, 90d supply, fill #0

## 2023-02-10 ENCOUNTER — Other Ambulatory Visit (HOSPITAL_COMMUNITY): Payer: Self-pay

## 2023-02-12 ENCOUNTER — Other Ambulatory Visit (HOSPITAL_COMMUNITY): Payer: Self-pay

## 2023-02-17 ENCOUNTER — Other Ambulatory Visit: Payer: Self-pay

## 2023-02-25 ENCOUNTER — Other Ambulatory Visit: Payer: Self-pay

## 2023-03-10 ENCOUNTER — Encounter: Payer: Self-pay | Admitting: Family Medicine

## 2023-03-10 ENCOUNTER — Other Ambulatory Visit: Payer: Self-pay

## 2023-03-10 ENCOUNTER — Ambulatory Visit (INDEPENDENT_AMBULATORY_CARE_PROVIDER_SITE_OTHER): Payer: 59 | Admitting: Family Medicine

## 2023-03-10 ENCOUNTER — Other Ambulatory Visit: Payer: Self-pay | Admitting: Family Medicine

## 2023-03-10 VITALS — BP 122/79 | HR 74 | Ht 70.0 in | Wt 239.0 lb

## 2023-03-10 DIAGNOSIS — E1122 Type 2 diabetes mellitus with diabetic chronic kidney disease: Secondary | ICD-10-CM | POA: Diagnosis not present

## 2023-03-10 DIAGNOSIS — M1712 Unilateral primary osteoarthritis, left knee: Secondary | ICD-10-CM

## 2023-03-10 DIAGNOSIS — R809 Proteinuria, unspecified: Secondary | ICD-10-CM

## 2023-03-10 DIAGNOSIS — F411 Generalized anxiety disorder: Secondary | ICD-10-CM | POA: Diagnosis not present

## 2023-03-10 DIAGNOSIS — E1129 Type 2 diabetes mellitus with other diabetic kidney complication: Secondary | ICD-10-CM | POA: Diagnosis not present

## 2023-03-10 DIAGNOSIS — E782 Mixed hyperlipidemia: Secondary | ICD-10-CM | POA: Diagnosis not present

## 2023-03-10 DIAGNOSIS — N183 Chronic kidney disease, stage 3 unspecified: Secondary | ICD-10-CM

## 2023-03-10 LAB — LIPID PANEL
Chol/HDL Ratio: 3.5 ratio (ref 0.0–5.0)
Cholesterol, Total: 98 mg/dL — ABNORMAL LOW (ref 100–199)
HDL: 28 mg/dL — ABNORMAL LOW (ref >39)
LDL Chol Calc (NIH): 34 mg/dL (ref 0–99)
Triglycerides: 231 mg/dL — ABNORMAL HIGH (ref 0–149)
VLDL Cholesterol Cal: 36 mg/dL (ref 5–40)

## 2023-03-10 LAB — CMP14+EGFR
ALT: 24 IU/L (ref 0–44)
AST: 21 IU/L (ref 0–40)
Albumin/Globulin Ratio: 1.9 (ref 1.2–2.2)
Albumin: 4.7 g/dL (ref 3.9–4.9)
Alkaline Phosphatase: 76 IU/L (ref 44–121)
BUN/Creatinine Ratio: 16 (ref 10–24)
BUN: 18 mg/dL (ref 8–27)
Bilirubin Total: 1.3 mg/dL — ABNORMAL HIGH (ref 0.0–1.2)
CO2: 21 mmol/L (ref 20–29)
Calcium: 10 mg/dL (ref 8.6–10.2)
Chloride: 101 mmol/L (ref 96–106)
Creatinine, Ser: 1.15 mg/dL (ref 0.76–1.27)
Globulin, Total: 2.5 g/dL (ref 1.5–4.5)
Glucose: 166 mg/dL — ABNORMAL HIGH (ref 70–99)
Potassium: 4.6 mmol/L (ref 3.5–5.2)
Sodium: 139 mmol/L (ref 134–144)
Total Protein: 7.2 g/dL (ref 6.0–8.5)
eGFR: 71 mL/min/{1.73_m2} (ref >59)

## 2023-03-10 LAB — CBC WITH DIFFERENTIAL/PLATELET
Basophils Absolute: 0.1 10*3/uL (ref 0.0–0.2)
Basos: 1 %
EOS (ABSOLUTE): 0.1 10*3/uL (ref 0.0–0.4)
Eos: 2 %
Hematocrit: 56.5 % — ABNORMAL HIGH (ref 37.5–51.0)
Hemoglobin: 19.1 g/dL — ABNORMAL HIGH (ref 13.0–17.7)
Immature Grans (Abs): 0 10*3/uL (ref 0.0–0.1)
Immature Granulocytes: 1 %
Lymphocytes Absolute: 1.6 10*3/uL (ref 0.7–3.1)
Lymphs: 20 %
MCH: 29.7 pg (ref 26.6–33.0)
MCHC: 33.8 g/dL (ref 31.5–35.7)
MCV: 88 fL (ref 79–97)
Monocytes Absolute: 0.5 10*3/uL (ref 0.1–0.9)
Monocytes: 7 %
Neutrophils Absolute: 5.6 10*3/uL (ref 1.4–7.0)
Neutrophils: 69 %
Platelets: 144 10*3/uL — ABNORMAL LOW (ref 150–450)
RBC: 6.43 x10E6/uL — ABNORMAL HIGH (ref 4.14–5.80)
RDW: 13.7 % (ref 11.6–15.4)
WBC: 7.9 10*3/uL (ref 3.4–10.8)

## 2023-03-10 LAB — BAYER DCA HB A1C WAIVED: HB A1C (BAYER DCA - WAIVED): 6.8 % — ABNORMAL HIGH (ref 4.8–5.6)

## 2023-03-10 MED ORDER — ALPRAZOLAM 1 MG PO TABS
1.0000 mg | ORAL_TABLET | Freq: Three times a day (TID) | ORAL | 2 refills | Status: DC | PRN
  Filled 2023-03-10: qty 90, 30d supply, fill #0
  Filled 2023-04-13: qty 90, 30d supply, fill #1
  Filled 2023-05-10: qty 90, 30d supply, fill #2

## 2023-03-10 NOTE — Progress Notes (Signed)
BP 122/79   Pulse 74   Ht 5\' 10"  (1.778 m)   Wt 239 lb (108.4 kg)   SpO2 96%   BMI 34.29 kg/m    Subjective:   Patient ID: Francisco Allen, male    DOB: Feb 11, 1961, 62 y.o.   MRN: [id number]  HPI: Francisco Allen is a 62 y.o. male presenting on 03/10/2023 for Medical Management of Chronic Issues, Diabetes, and Hypertension   HPI Type 2 diabetes mellitus Patient comes in today for recheck of his diabetes. Patient has been currently taking Ozempic. Patient is currently on an ACE inhibitor/ARB. Patient has not seen an ophthalmologist this year. Patient denies any new issues with their feet. The symptom started onset as an adult hypertension and hyperlipidemia and CKD 3 ARE RELATED TO DM   Hypertension Patient is currently on carvedilol and lisinopril, and their blood pressure today is 122/79. Patient denies any lightheadedness or dizziness. Patient denies headaches, blurred vision, chest pains, shortness of breath, or weakness. Denies any side effects from medication and is content with current medication.   Hyperlipidemia Patient is coming in for recheck of his hyperlipidemia. The patient is currently taking Crestor and fish oils. They deny any issues with myalgias or history of liver damage from it. They deny any focal numbness or weakness or chest pain.   Anxiety recheck Current rx-alprazolam 1 mg 3 times daily as needed # meds rx-90/month Effectiveness of current meds-works well Adverse reactions form meds-none  Pill count performed-No Last drug screen -03/12/2022 ( high risk q11m, moderate risk q24m, low risk yearly ) Urine drug screen today- Yes Was the NCCSR reviewed-yes  If yes were their any concerning findings? -None  Controlled substance contract signed on: Today  Relevant past medical, surgical, family and social history reviewed and updated as indicated. Interim medical history since our last visit reviewed. Allergies and medications reviewed and updated.  Review of  Systems  Constitutional:  Negative for chills and fever.  Eyes:  Negative for visual disturbance.  Respiratory:  Negative for shortness of breath and wheezing.   Cardiovascular:  Negative for chest pain and leg swelling.  Musculoskeletal:  Negative for back pain and gait problem.  Skin:  Negative for rash.  Neurological:  Negative for dizziness, weakness and light-headedness.  Psychiatric/Behavioral:  The patient is nervous/anxious.   All other systems reviewed and are negative.   Per HPI unless specifically indicated above   Allergies as of 03/10/2023       Reactions   Bee Venom Anaphylaxis, Swelling, Other (See Comments)   Anaphylactic shock   Penicillins Anaphylaxis, Swelling   Airway closes up Has patient had a PCN reaction causing immediate rash, facial/tongue/throat swelling, SOB or lightheadedness with hypotension: Yes Has patient had a PCN reaction causing severe rash involving mucus membranes or skin necrosis: Yes Has patient had a PCN reaction that required hospitalization: [name] Has patient had a PCN reaction occurring within the last 10 years: No If all of the above answers are "NO", then may proceed with Cephalosporin use.        Medication List        Accurate as of Mar 10, 2023 10:02 AM. If you have any questions, ask your nurse or doctor.          ALPRAZolam 1 MG tablet Commonly known as: XANAX Take 1 tablet (1 mg total) by mouth 3 (three) times daily as needed for anxiety.   carvedilol 6.25 MG tablet Commonly known as: COREG Take 1  tablet (6.25 mg total) by mouth in the morning and in the evening with a meal   diclofenac Sodium 1 % Gel Commonly known as: Voltaren Apply 2 grams topically 4 (four) times daily.   Fish Oil 1000 MG Caps Take 1,000 mg by mouth daily. 4 daily  Ubiquinol   lisinopril 2.5 MG tablet Commonly known as: ZESTRIL Take 1 tablet (2.5 mg total) by mouth daily.   MENS 50+ MULTI VITAMIN/MIN PO Take 1 tablet by mouth daily.    Ozempic (1 MG/DOSE) 4 MG/3ML Sopn Generic drug: Semaglutide (1 MG/DOSE) Inject 1 mg as directed once a week.   rosuvastatin 40 MG tablet Commonly known as: CRESTOR Take 1 tablet (40 mg total) by mouth daily.   tamsulosin 0.4 MG Caps capsule Commonly known as: FLOMAX Take 1 capsule (0.4 mg total) by mouth 2 (two) times daily.         Objective:   BP 122/79   Pulse 74   Ht 5\' 10"  (1.778 m)   Wt 239 lb (108.4 kg)   SpO2 96%   BMI 34.29 kg/m   Wt Readings from Last 3 Encounters:  03/10/23 239 lb (108.4 kg)  12/10/22 243 lb 4.8 oz (110.4 kg)  12/09/22 243 lb (110.2 kg)    Physical Exam Vitals and nursing note reviewed.  Constitutional:      General: He is not in acute distress.    Appearance: He is well-developed. He is not diaphoretic.  Eyes:     General: No scleral icterus.    Conjunctiva/sclera: Conjunctivae normal.  Neck:     Thyroid: No thyromegaly.  Cardiovascular:     Rate and Rhythm: Normal rate and regular rhythm.     Heart sounds: Normal heart sounds. No murmur heard. Pulmonary:     Effort: Pulmonary effort is normal. No respiratory distress.     Breath sounds: Normal breath sounds. No wheezing.  Musculoskeletal:        General: No swelling. Normal range of motion.     Cervical back: Neck supple.  Lymphadenopathy:     Cervical: No cervical adenopathy.  Skin:    General: Skin is warm and dry.     Findings: No rash.  Neurological:     Mental Status: He is alert and oriented to person, place, and time.     Coordination: Coordination normal.  Psychiatric:        Behavior: Behavior normal.     Assessment & Plan:   Problem List Items Addressed This Visit       Endocrine   CKD stage 3 due to type 2 diabetes mellitus (HCC)   Relevant Orders   CBC with Differential/Platelet   CMP14+EGFR   Lipid panel   Bayer DCA Hb A1c Waived   ToxASSURE Select 13 (MW), Urine     Other   GAD (generalized anxiety disorder)   Relevant Orders   CBC with  Differential/Platelet   CMP14+EGFR   Lipid panel   Bayer DCA Hb A1c Waived   ToxASSURE Select 13 (MW), Urine   Hyperlipidemia   Relevant Orders   CBC with Differential/Platelet   CMP14+EGFR   Lipid panel   Bayer DCA Hb A1c Waived   ToxASSURE Select 13 (MW), Urine   Other Visit Diagnoses     Primary osteoarthritis of left knee    -  Primary   Type 2 diabetes mellitus with microalbuminuria, without long-term current use of insulin (HCC)       Relevant Orders   CBC  with Differential/Platelet   CMP14+EGFR   Lipid panel   Bayer DCA Hb A1c Waived   ToxASSURE Select 13 (MW), Urine       Seems to be doing well, A1c is up slightly at 6.8, focus on diet.  No other changes. Follow up plan: Return in about 3 months (around 06/10/2023), or if symptoms worsen or fail to improve, for Diabetes recheck.  Counseling provided for all of the vaccine components Orders Placed This Encounter  Procedures   CBC with Differential/Platelet   CMP14+EGFR   Lipid panel   Bayer DCA Hb A1c Waived   ToxASSURE Select 13 (MW), Urine    Arville Care, MD [facility] 03/10/2023, 10:02 AM

## 2023-03-15 LAB — TOXASSURE SELECT 13 (MW), URINE

## 2023-03-18 ENCOUNTER — Other Ambulatory Visit (HOSPITAL_COMMUNITY): Payer: Self-pay

## 2023-03-25 ENCOUNTER — Other Ambulatory Visit: Payer: Self-pay

## 2023-04-13 ENCOUNTER — Other Ambulatory Visit: Payer: Self-pay | Admitting: Family Medicine

## 2023-04-13 ENCOUNTER — Other Ambulatory Visit: Payer: Self-pay

## 2023-04-13 ENCOUNTER — Other Ambulatory Visit (HOSPITAL_COMMUNITY): Payer: Self-pay

## 2023-04-13 DIAGNOSIS — M1712 Unilateral primary osteoarthritis, left knee: Secondary | ICD-10-CM

## 2023-04-13 MED ORDER — DICLOFENAC SODIUM 1 % EX GEL
2.0000 g | Freq: Four times a day (QID) | CUTANEOUS | 0 refills | Status: DC
  Filled 2023-04-13: qty 300, 39d supply, fill #0

## 2023-04-20 NOTE — Addendum Note (Signed)
Addended by: Julious Payer D on: 04/20/2023 07:45 AM   Modules accepted: Orders

## 2023-04-20 NOTE — Telephone Encounter (Signed)
Pt is needing to get 4 tubes at a time, this costs them $10 at a time, last RF was for 300 g Please advise and send in new refill

## 2023-04-21 ENCOUNTER — Other Ambulatory Visit (HOSPITAL_COMMUNITY): Payer: Self-pay

## 2023-04-21 MED ORDER — DICLOFENAC SODIUM 1 % EX GEL
2.0000 g | Freq: Four times a day (QID) | CUTANEOUS | 5 refills | Status: DC
Start: 2023-04-21 — End: 2024-09-01
  Filled 2023-04-21: qty 600, fill #0
  Filled 2023-05-10: qty 600, 38d supply, fill #0
  Filled 2023-05-14: qty 600, 37d supply, fill #0
  Filled 2023-06-23: qty 600, 37d supply, fill #1
  Filled 2023-08-12 – 2023-09-18 (×4): qty 600, 37d supply, fill #2
  Filled 2023-10-24 – 2023-11-16 (×3): qty 600, 37d supply, fill #3
  Filled 2023-12-23: qty 600, 37d supply, fill #4
  Filled 2024-02-13: qty 600, 37d supply, fill #5

## 2023-04-21 NOTE — Addendum Note (Signed)
Addended by: Arville Care on: 04/21/2023 07:38 AM   Modules accepted: Orders

## 2023-04-21 NOTE — Telephone Encounter (Signed)
Sent prescription away that I think will get patient more quantity because there is now more joints.

## 2023-04-22 ENCOUNTER — Other Ambulatory Visit (HOSPITAL_COMMUNITY): Payer: Self-pay

## 2023-04-28 ENCOUNTER — Other Ambulatory Visit: Payer: Self-pay

## 2023-04-28 ENCOUNTER — Other Ambulatory Visit (HOSPITAL_COMMUNITY): Payer: Self-pay

## 2023-05-10 ENCOUNTER — Other Ambulatory Visit: Payer: Self-pay

## 2023-05-10 ENCOUNTER — Other Ambulatory Visit (HOSPITAL_COMMUNITY): Payer: Self-pay

## 2023-05-11 ENCOUNTER — Other Ambulatory Visit: Payer: Self-pay

## 2023-05-14 ENCOUNTER — Other Ambulatory Visit (HOSPITAL_COMMUNITY): Payer: Self-pay

## 2023-05-14 ENCOUNTER — Other Ambulatory Visit: Payer: Self-pay

## 2023-05-17 ENCOUNTER — Other Ambulatory Visit: Payer: Self-pay

## 2023-05-22 ENCOUNTER — Other Ambulatory Visit (HOSPITAL_COMMUNITY): Payer: Self-pay

## 2023-05-24 ENCOUNTER — Other Ambulatory Visit: Payer: Self-pay

## 2023-06-10 ENCOUNTER — Other Ambulatory Visit: Payer: Self-pay | Admitting: Family Medicine

## 2023-06-10 DIAGNOSIS — F411 Generalized anxiety disorder: Secondary | ICD-10-CM

## 2023-06-11 ENCOUNTER — Other Ambulatory Visit: Payer: Self-pay | Admitting: Family Medicine

## 2023-06-11 DIAGNOSIS — F411 Generalized anxiety disorder: Secondary | ICD-10-CM

## 2023-06-16 ENCOUNTER — Encounter: Payer: Self-pay | Admitting: Family Medicine

## 2023-06-16 ENCOUNTER — Other Ambulatory Visit (HOSPITAL_COMMUNITY): Payer: Self-pay

## 2023-06-16 ENCOUNTER — Ambulatory Visit (INDEPENDENT_AMBULATORY_CARE_PROVIDER_SITE_OTHER): Payer: 59 | Admitting: Family Medicine

## 2023-06-16 ENCOUNTER — Other Ambulatory Visit: Payer: Self-pay

## 2023-06-16 VITALS — BP 138/84 | HR 69 | Ht 70.0 in | Wt 242.0 lb

## 2023-06-16 DIAGNOSIS — E1129 Type 2 diabetes mellitus with other diabetic kidney complication: Secondary | ICD-10-CM

## 2023-06-16 DIAGNOSIS — N183 Chronic kidney disease, stage 3 unspecified: Secondary | ICD-10-CM | POA: Diagnosis not present

## 2023-06-16 DIAGNOSIS — F411 Generalized anxiety disorder: Secondary | ICD-10-CM | POA: Diagnosis not present

## 2023-06-16 DIAGNOSIS — R809 Proteinuria, unspecified: Secondary | ICD-10-CM | POA: Diagnosis not present

## 2023-06-16 DIAGNOSIS — E782 Mixed hyperlipidemia: Secondary | ICD-10-CM

## 2023-06-16 DIAGNOSIS — I10 Essential (primary) hypertension: Secondary | ICD-10-CM | POA: Diagnosis not present

## 2023-06-16 DIAGNOSIS — E1122 Type 2 diabetes mellitus with diabetic chronic kidney disease: Secondary | ICD-10-CM | POA: Diagnosis not present

## 2023-06-16 LAB — CBC WITH DIFFERENTIAL/PLATELET
Basophils Absolute: 0.1 10*3/uL (ref 0.0–0.2)
Basos: 1 %
EOS (ABSOLUTE): 0.2 10*3/uL (ref 0.0–0.4)
Eos: 3 %
Hematocrit: 57.3 % — ABNORMAL HIGH (ref 37.5–51.0)
Hemoglobin: 19.7 g/dL — ABNORMAL HIGH (ref 13.0–17.7)
Immature Grans (Abs): 0.1 10*3/uL (ref 0.0–0.1)
Immature Granulocytes: 1 %
Lymphocytes Absolute: 1.7 10*3/uL (ref 0.7–3.1)
Lymphs: 26 %
MCH: 30.4 pg (ref 26.6–33.0)
MCHC: 34.4 g/dL (ref 31.5–35.7)
MCV: 88 fL (ref 79–97)
Monocytes Absolute: 0.6 10*3/uL (ref 0.1–0.9)
Monocytes: 9 %
Neutrophils Absolute: 4.1 10*3/uL (ref 1.4–7.0)
Neutrophils: 60 %
Platelets: 120 10*3/uL — ABNORMAL LOW (ref 150–450)
RBC: 6.49 x10E6/uL — ABNORMAL HIGH (ref 4.14–5.80)
RDW: 13.8 % (ref 11.6–15.4)
WBC: 6.7 10*3/uL (ref 3.4–10.8)

## 2023-06-16 LAB — CMP14+EGFR
ALT: 23 IU/L (ref 0–44)
AST: 22 IU/L (ref 0–40)
Albumin: 4.6 g/dL (ref 3.9–4.9)
Alkaline Phosphatase: 70 IU/L (ref 44–121)
BUN/Creatinine Ratio: 19 (ref 10–24)
BUN: 23 mg/dL (ref 8–27)
Bilirubin Total: 1.7 mg/dL — ABNORMAL HIGH (ref 0.0–1.2)
CO2: 22 mmol/L (ref 20–29)
Calcium: 9.4 mg/dL (ref 8.6–10.2)
Chloride: 101 mmol/L (ref 96–106)
Creatinine, Ser: 1.23 mg/dL (ref 0.76–1.27)
Globulin, Total: 2.5 g/dL (ref 1.5–4.5)
Glucose: 160 mg/dL — ABNORMAL HIGH (ref 70–99)
Potassium: 5 mmol/L (ref 3.5–5.2)
Sodium: 141 mmol/L (ref 134–144)
Total Protein: 7.1 g/dL (ref 6.0–8.5)
eGFR: 66 mL/min/{1.73_m2} (ref >59)

## 2023-06-16 LAB — BAYER DCA HB A1C WAIVED: HB A1C (BAYER DCA - WAIVED): 6.7 % — ABNORMAL HIGH (ref 4.8–5.6)

## 2023-06-16 LAB — LIPID PANEL
Chol/HDL Ratio: 4.1 ratio (ref 0.0–5.0)
Cholesterol, Total: 103 mg/dL (ref 100–199)
HDL: 25 mg/dL — ABNORMAL LOW (ref >39)
LDL Chol Calc (NIH): 35 mg/dL (ref 0–99)
Triglycerides: 284 mg/dL — ABNORMAL HIGH (ref 0–149)
VLDL Cholesterol Cal: 43 mg/dL — ABNORMAL HIGH (ref 5–40)

## 2023-06-16 MED ORDER — ALPRAZOLAM 1 MG PO TABS
1.0000 mg | ORAL_TABLET | Freq: Three times a day (TID) | ORAL | 2 refills | Status: DC | PRN
Start: 2023-06-16 — End: 2023-09-16
  Filled 2023-06-16: qty 90, 30d supply, fill #0
  Filled 2023-07-13 – 2023-07-14 (×2): qty 90, 30d supply, fill #1
  Filled 2023-08-12: qty 90, 30d supply, fill #2

## 2023-06-16 MED ORDER — LISINOPRIL 2.5 MG PO TABS
2.5000 mg | ORAL_TABLET | Freq: Every day | ORAL | 3 refills | Status: DC
  Filled 2023-06-16 – 2023-07-13 (×2): qty 90, 90d supply, fill #0
  Filled 2023-10-11: qty 90, 90d supply, fill #1
  Filled 2024-01-04: qty 90, 90d supply, fill #2
  Filled 2024-04-04: qty 90, 90d supply, fill #3

## 2023-06-16 NOTE — Progress Notes (Signed)
BP 138/84   Pulse 69   Ht 5\' 10"  (1.778 m)   Wt 242 lb (109.8 kg)   SpO2 96%   BMI 34.72 kg/m    Subjective:   Patient ID: Francisco Allen, male    DOB: 1961/07/07, 61 y.o.   MRN: [id number]  HPI: Francisco Allen is a 62 y.o. male presenting on 06/16/2023 for Medical Management of Chronic Issues, Diabetes, Hyperlipidemia, and Hypertension   HPI Type 2 diabetes mellitus Patient comes in today for recheck of his diabetes. Patient has been currently taking Ozempic. Patient is currently on an ACE inhibitor/ARB. Patient has not seen an ophthalmologist this year. Patient denies any new issues with their feet. The symptom started onset as an adult hypertension hyperlipidemia ARE RELATED TO DM   Hyperlipidemia Patient is coming in for recheck of his hyperlipidemia. The patient is currently taking Crestor and fish oils. They deny any issues with myalgias or history of liver damage from it. They deny any focal numbness or weakness or chest pain.   Hypertension Patient is currently on carvedilol and lisinopril, and their blood pressure today is 138/84. Patient denies any lightheadedness or dizziness. Patient denies headaches, blurred vision, chest pains, shortness of breath, or weakness. Denies any side effects from medication and is content with current medication.   Anxiety recheck Current rx-alprazolam 1 mg 3 times daily as needed # meds rx-90/month Effectiveness of current meds-works well Adverse reactions form meds-none Pill count performed-No Last drug screen -03/29/2023 ( high risk q44m, moderate risk q62m, low risk yearly ) Urine drug screen today- Yes Was the NCCSR reviewed-yes  If yes were their any concerning findings? -None Controlled substance contract signed on: Today  Relevant past medical, surgical, family and social history reviewed and updated as indicated. Interim medical history since our last visit reviewed. Allergies and medications reviewed and updated.  Review of  Systems  Constitutional:  Negative for chills and fever.  Eyes:  Negative for visual disturbance.  Respiratory:  Negative for shortness of breath and wheezing.   Cardiovascular:  Negative for chest pain and leg swelling.  Musculoskeletal:  Negative for back pain and gait problem.  Skin:  Negative for rash.  Neurological:  Negative for dizziness, weakness and light-headedness.  Psychiatric/Behavioral:  Negative for dysphoric mood. The patient is nervous/anxious.   All other systems reviewed and are negative.   Per HPI unless specifically indicated above   Allergies as of 06/16/2023       Reactions   Francisco Venom Anaphylaxis, Swelling, Other (See Comments)   Anaphylactic shock   Penicillins Anaphylaxis, Swelling   Airway closes up Has patient had a PCN reaction causing immediate rash, facial/tongue/throat swelling, SOB or lightheadedness with hypotension: Yes Has patient had a PCN reaction causing severe rash involving mucus membranes or skin necrosis: Yes Has patient had a PCN reaction that required hospitalization: [name] Has patient had a PCN reaction occurring within the last 10 years: No If all of the above answers are "NO", then may proceed with Cephalosporin use.        Medication List        Accurate as of June 16, 2023  8:42 AM. If you have any questions, ask your nurse or doctor.          ALPRAZolam 1 MG tablet Commonly known as: XANAX Take 1 tablet (1 mg total) by mouth 3 (three) times daily as needed for anxiety.   carvedilol 6.25 MG tablet Commonly known as: COREG Take 1  tablet (6.25 mg total) by mouth in the morning and in the evening with a meal   diclofenac Sodium 1 % Gel Commonly known as: Voltaren Apply 2-4 g topically 4 (four) times daily.   Fish Oil 1000 MG Caps Take 1,000 mg by mouth daily. 4 daily  Ubiquinol   lisinopril 2.5 MG tablet Commonly known as: ZESTRIL Take 1 tablet (2.5 mg total) by mouth daily.   MENS 50+ MULTI VITAMIN/MIN PO Take  1 tablet by mouth daily.   Ozempic (1 MG/DOSE) 4 MG/3ML Sopn Generic drug: Semaglutide (1 MG/DOSE) Inject 1 mg as directed once a week.   rosuvastatin 40 MG tablet Commonly known as: CRESTOR Take 1 tablet (40 mg total) by mouth daily.   tamsulosin 0.4 MG Caps capsule Commonly known as: FLOMAX Take 1 capsule (0.4 mg total) by mouth 2 (two) times daily.         Objective:   BP 138/84   Pulse 69   Ht 5\' 10"  (1.778 m)   Wt 242 lb (109.8 kg)   SpO2 96%   BMI 34.72 kg/m   Wt Readings from Last 3 Encounters:  06/16/23 242 lb (109.8 kg)  03/10/23 239 lb (108.4 kg)  12/10/22 243 lb 4.8 oz (110.4 kg)    Physical Exam Vitals and nursing note reviewed.  Constitutional:      General: He is not in acute distress.    Appearance: He is well-developed. He is not diaphoretic.  Eyes:     General: No scleral icterus.    Conjunctiva/sclera: Conjunctivae normal.  Neck:     Thyroid: No thyromegaly.  Cardiovascular:     Rate and Rhythm: Normal rate and regular rhythm.     Heart sounds: Normal heart sounds. No murmur heard. Pulmonary:     Effort: Pulmonary effort is normal. No respiratory distress.     Breath sounds: Normal breath sounds. No wheezing.  Musculoskeletal:        General: No swelling. Normal range of motion.     Cervical back: Neck supple.  Lymphadenopathy:     Cervical: No cervical adenopathy.  Skin:    General: Skin is warm and dry.     Findings: No rash.  Neurological:     Mental Status: He is alert and oriented to person, place, and time.     Coordination: Coordination normal.  Psychiatric:        Behavior: Behavior normal.       Assessment & Plan:   Problem List Items Addressed This Visit       Cardiovascular and Mediastinum   HTN (hypertension)   Relevant Medications   lisinopril (ZESTRIL) 2.5 MG tablet     Endocrine   Type 2 diabetes mellitus with microalbuminuria (HCC)   Relevant Medications   lisinopril (ZESTRIL) 2.5 MG tablet   CKD stage  3 due to type 2 diabetes mellitus (HCC)   Relevant Medications   lisinopril (ZESTRIL) 2.5 MG tablet   Other Relevant Orders   CBC with Differential/Platelet   CMP14+EGFR   Lipid panel   Bayer DCA Hb A1c Waived     Other   GAD (generalized anxiety disorder)   Relevant Medications   ALPRAZolam (XANAX) 1 MG tablet   Hyperlipidemia   Relevant Medications   lisinopril (ZESTRIL) 2.5 MG tablet   Other Visit Diagnoses     Type 2 diabetes mellitus with microalbuminuria, without long-term current use of insulin (HCC)    -  Primary   Relevant Medications   lisinopril (  ZESTRIL) 2.5 MG tablet   Other Relevant Orders   CBC with Differential/Platelet   CMP14+EGFR   Lipid panel   Bayer DCA Hb A1c Waived     A1c 6.7 today.  Better than last time, continue to focus on diet.  No medication changes, sent refills for them.  Follow up plan: Return in about 3 months (around 09/16/2023), or if symptoms worsen or fail to improve, for Anxiety and diabetes recheck.  Counseling provided for all of the vaccine components Orders Placed This Encounter  Procedures   CBC with Differential/Platelet   CMP14+EGFR   Lipid panel   Bayer DCA Hb A1c Waived    Arville Care, MD [facility] 06/16/2023, 8:42 AM

## 2023-06-22 ENCOUNTER — Other Ambulatory Visit (HOSPITAL_COMMUNITY): Payer: Self-pay

## 2023-06-23 ENCOUNTER — Other Ambulatory Visit (HOSPITAL_COMMUNITY): Payer: Self-pay

## 2023-06-23 ENCOUNTER — Other Ambulatory Visit: Payer: Self-pay

## 2023-07-13 ENCOUNTER — Other Ambulatory Visit (HOSPITAL_COMMUNITY): Payer: Self-pay

## 2023-07-14 ENCOUNTER — Other Ambulatory Visit (HOSPITAL_COMMUNITY): Payer: Self-pay

## 2023-07-16 ENCOUNTER — Other Ambulatory Visit: Payer: Self-pay

## 2023-07-24 ENCOUNTER — Other Ambulatory Visit (HOSPITAL_COMMUNITY): Payer: Self-pay

## 2023-08-12 ENCOUNTER — Other Ambulatory Visit (HOSPITAL_COMMUNITY): Payer: Self-pay

## 2023-08-13 ENCOUNTER — Other Ambulatory Visit: Payer: Self-pay

## 2023-08-16 ENCOUNTER — Other Ambulatory Visit: Payer: Self-pay

## 2023-08-19 ENCOUNTER — Other Ambulatory Visit (HOSPITAL_COMMUNITY): Payer: Self-pay

## 2023-08-20 ENCOUNTER — Other Ambulatory Visit: Payer: Self-pay

## 2023-08-22 ENCOUNTER — Other Ambulatory Visit (HOSPITAL_COMMUNITY): Payer: Self-pay

## 2023-08-23 ENCOUNTER — Other Ambulatory Visit (HOSPITAL_COMMUNITY): Payer: Self-pay

## 2023-08-23 ENCOUNTER — Other Ambulatory Visit: Payer: Self-pay

## 2023-08-27 ENCOUNTER — Other Ambulatory Visit: Payer: Self-pay

## 2023-08-27 ENCOUNTER — Other Ambulatory Visit (HOSPITAL_COMMUNITY): Payer: Self-pay

## 2023-08-31 ENCOUNTER — Other Ambulatory Visit: Payer: Self-pay

## 2023-08-31 ENCOUNTER — Other Ambulatory Visit (HOSPITAL_COMMUNITY): Payer: Self-pay

## 2023-09-16 ENCOUNTER — Other Ambulatory Visit (HOSPITAL_COMMUNITY): Payer: Self-pay

## 2023-09-16 ENCOUNTER — Other Ambulatory Visit: Payer: Self-pay

## 2023-09-16 ENCOUNTER — Encounter: Payer: Self-pay | Admitting: Family Medicine

## 2023-09-16 ENCOUNTER — Ambulatory Visit (INDEPENDENT_AMBULATORY_CARE_PROVIDER_SITE_OTHER): Payer: 59 | Admitting: Family Medicine

## 2023-09-16 VITALS — BP 123/80 | HR 75 | Ht 70.0 in | Wt 245.0 lb

## 2023-09-16 DIAGNOSIS — R809 Proteinuria, unspecified: Secondary | ICD-10-CM | POA: Diagnosis not present

## 2023-09-16 DIAGNOSIS — F411 Generalized anxiety disorder: Secondary | ICD-10-CM

## 2023-09-16 DIAGNOSIS — N183 Chronic kidney disease, stage 3 unspecified: Secondary | ICD-10-CM | POA: Diagnosis not present

## 2023-09-16 DIAGNOSIS — E1129 Type 2 diabetes mellitus with other diabetic kidney complication: Secondary | ICD-10-CM | POA: Diagnosis not present

## 2023-09-16 DIAGNOSIS — F17210 Nicotine dependence, cigarettes, uncomplicated: Secondary | ICD-10-CM

## 2023-09-16 DIAGNOSIS — E782 Mixed hyperlipidemia: Secondary | ICD-10-CM

## 2023-09-16 DIAGNOSIS — E1122 Type 2 diabetes mellitus with diabetic chronic kidney disease: Secondary | ICD-10-CM | POA: Diagnosis not present

## 2023-09-16 DIAGNOSIS — Z23 Encounter for immunization: Secondary | ICD-10-CM | POA: Diagnosis not present

## 2023-09-16 DIAGNOSIS — E785 Hyperlipidemia, unspecified: Secondary | ICD-10-CM

## 2023-09-16 DIAGNOSIS — Z7985 Long-term (current) use of injectable non-insulin antidiabetic drugs: Secondary | ICD-10-CM

## 2023-09-16 DIAGNOSIS — I10 Essential (primary) hypertension: Secondary | ICD-10-CM

## 2023-09-16 LAB — BAYER DCA HB A1C WAIVED: HB A1C (BAYER DCA - WAIVED): 7.6 % — ABNORMAL HIGH (ref 4.8–5.6)

## 2023-09-16 MED ORDER — SEMAGLUTIDE (1 MG/DOSE) 4 MG/3ML ~~LOC~~ SOPN
1.0000 mg | PEN_INJECTOR | SUBCUTANEOUS | 3 refills | Status: DC
  Filled 2023-09-16 – 2023-10-11 (×2): qty 9, 84d supply, fill #0

## 2023-09-16 MED ORDER — ALPRAZOLAM 1 MG PO TABS
1.0000 mg | ORAL_TABLET | Freq: Three times a day (TID) | ORAL | 2 refills | Status: DC | PRN
  Filled 2023-09-16: qty 90, 30d supply, fill #0
  Filled 2023-10-14 – 2023-10-15 (×2): qty 90, 30d supply, fill #1
  Filled 2023-11-19: qty 90, 30d supply, fill #2

## 2023-09-16 MED ORDER — CARVEDILOL 6.25 MG PO TABS
6.2500 mg | ORAL_TABLET | Freq: Two times a day (BID) | ORAL | 3 refills | Status: DC
  Filled 2023-09-16 – 2023-11-19 (×2): qty 180, 90d supply, fill #0
  Filled 2024-02-21 – 2024-02-25 (×2): qty 180, 90d supply, fill #1
  Filled 2024-05-20: qty 180, 90d supply, fill #2
  Filled 2024-08-21: qty 180, 90d supply, fill #3

## 2023-09-16 NOTE — Progress Notes (Signed)
BP 123/80   Pulse 75   Ht 5\' 10"  (1.778 m)   Wt 245 lb (111.1 kg)   SpO2 95%   BMI 35.15 kg/m    Subjective:   Patient ID: Francisco Allen, male    DOB: 1960-12-15, 62 y.o.   MRN: [id number]  HPI: Francisco Allen is a 62 y.o. male presenting on 09/16/2023 for No chief complaint on file.   HPI Type 2 diabetes mellitus Patient comes in today for recheck of his diabetes. Patient has been currently taking Ozempic. Patient is currently on an ACE inhibitor/ARB. Patient has seen an ophthalmologist this year.  Patient has thickened toenails. The symptom started onset as an adult hypertension and hyperlipidemia ARE RELATED TO DM   Hyperlipidemia Patient is coming in for recheck of his hyperlipidemia. The patient is currently taking Crestor. They deny any issues with myalgias or history of liver damage from it. They deny any focal numbness or weakness or chest pain.   Hypertension  Patient is currently on lisinopril and carvedilol, and their blood pressure today is 123/80. Patient denies any lightheadedness or dizziness. Patient denies headaches, blurred vision, chest pains, shortness of breath, or weakness. Denies any side effects from medication and is content with current medication.   Anxiety recheck Current rx-alprazolam 1 mg 3 times daily as needed # meds rx-90/month Effectiveness of current meds-works well Adverse reactions form meds-none Pill count performed-No Last drug screen -03/29/2023 ( high risk q54m, moderate risk q33m, low risk yearly ) Urine drug screen today- No Was the NCCSR reviewed-yes  If yes were their any concerning findings? -None Controlled substance contract signed on: 03/29/2023  Relevant past medical, surgical, family and social history reviewed and updated as indicated. Interim medical history since our last visit reviewed. Allergies and medications reviewed and updated.  Review of Systems  Constitutional:  Negative for chills and fever.  Eyes:  Negative  for visual disturbance.  Respiratory:  Negative for shortness of breath and wheezing.   Cardiovascular:  Negative for chest pain and leg swelling.  Musculoskeletal:  Negative for back pain and gait problem.  Skin:  Negative for rash.  Neurological:  Negative for dizziness, weakness and light-headedness.  All other systems reviewed and are negative.   Per HPI unless specifically indicated above   Allergies as of 09/16/2023       Reactions   Bee Venom Anaphylaxis, Swelling, Other (See Comments)   Anaphylactic shock   Penicillins Anaphylaxis, Swelling   Airway closes up Has patient had a PCN reaction causing immediate rash, facial/tongue/throat swelling, SOB or lightheadedness with hypotension: Yes Has patient had a PCN reaction causing severe rash involving mucus membranes or skin necrosis: Yes Has patient had a PCN reaction that required hospitalization: [name] Has patient had a PCN reaction occurring within the last 10 years: No If all of the above answers are "NO", then may proceed with Cephalosporin use.        Medication List        Accurate as of September 16, 2023  1:47 PM. If you have any questions, ask your nurse or doctor.          ALPRAZolam 1 MG tablet Commonly known as: XANAX Take 1 tablet (1 mg total) by mouth 3 (three) times daily as needed for anxiety.   carvedilol 6.25 MG tablet Commonly known as: COREG Take 1 tablet (6.25 mg total) by mouth in the morning and in the evening with a meal   diclofenac Sodium 1 %  Gel Commonly known as: Voltaren Apply 2-4 g topically 4 (four) times daily.   Fish Oil 1000 MG Caps Take 1,000 mg by mouth daily. 4 daily  Ubiquinol   lisinopril 2.5 MG tablet Commonly known as: ZESTRIL Take 1 tablet (2.5 mg total) by mouth daily.   MENS 50+ MULTI VITAMIN/MIN PO Take 1 tablet by mouth daily.   rosuvastatin 40 MG tablet Commonly known as: CRESTOR Take 1 tablet (40 mg total) by mouth daily.   Semaglutide (1 MG/DOSE) 4  MG/3ML Sopn Inject 1 mg as directed once a week.   tamsulosin 0.4 MG Caps capsule Commonly known as: FLOMAX Take 1 capsule (0.4 mg total) by mouth 2 (two) times daily.         Objective:   BP 123/80   Pulse 75   Ht 5\' 10"  (1.778 m)   Wt 245 lb (111.1 kg)   SpO2 95%   BMI 35.15 kg/m   Wt Readings from Last 3 Encounters:  09/16/23 245 lb (111.1 kg)  06/16/23 242 lb (109.8 kg)  03/10/23 239 lb (108.4 kg)    Physical Exam Vitals and nursing note reviewed.  Constitutional:      General: He is not in acute distress.    Appearance: He is well-developed. He is not diaphoretic.  Eyes:     General: No scleral icterus.    Conjunctiva/sclera: Conjunctivae normal.  Neck:     Thyroid: No thyromegaly.  Cardiovascular:     Rate and Rhythm: Normal rate and regular rhythm.     Heart sounds: Normal heart sounds. No murmur heard. Pulmonary:     Effort: Pulmonary effort is normal. No respiratory distress.     Breath sounds: Normal breath sounds. No wheezing.  Musculoskeletal:        General: Normal range of motion.     Cervical back: Neck supple.  Lymphadenopathy:     Cervical: No cervical adenopathy.  Skin:    General: Skin is warm and dry.     Findings: No rash.  Neurological:     Mental Status: He is alert and oriented to person, place, and time.     Coordination: Coordination normal.  Psychiatric:        Behavior: Behavior normal.       Assessment & Plan:   Problem List Items Addressed This Visit       Cardiovascular and Mediastinum   HTN (hypertension)   Relevant Medications   carvedilol (COREG) 6.25 MG tablet     Endocrine   Type 2 diabetes mellitus with microalbuminuria (HCC)   Relevant Medications   Semaglutide, 1 MG/DOSE, 4 MG/3ML SOPN   CKD stage 3 due to type 2 diabetes mellitus (HCC)   Relevant Medications   Semaglutide, 1 MG/DOSE, 4 MG/3ML SOPN   Other Relevant Orders   CBC with Differential/Platelet   CMP14+EGFR   Lipid panel   Bayer DCA Hb  A1c Waived     Other   GAD (generalized anxiety disorder)   Relevant Medications   ALPRAZolam (XANAX) 1 MG tablet   Semaglutide, 1 MG/DOSE, 4 MG/3ML SOPN   Hyperlipidemia   Relevant Medications   Semaglutide, 1 MG/DOSE, 4 MG/3ML SOPN   carvedilol (COREG) 6.25 MG tablet   Other Visit Diagnoses     Type 2 diabetes mellitus with microalbuminuria, without long-term current use of insulin (HCC)    -  Primary   Relevant Medications   Semaglutide, 1 MG/DOSE, 4 MG/3ML SOPN   Other Relevant Orders   CBC  with Differential/Platelet   CMP14+EGFR   Lipid panel   Bayer DCA Hb A1c Waived   Smokes less than 1 pack a day with greater than 20 pack year history           A1c 7.6, focus on diet.  Blood pressure and everything else looks good. Diabetic Foot Exam - Simple   Simple Foot Form Diabetic Foot exam was performed with the following findings: Yes 09/16/2023  1:46 PM  Visual Inspection No deformities, no ulcerations, no other skin breakdown bilaterally: Yes Sensation Testing Intact to touch and monofilament testing bilaterally: Yes Pulse Check Posterior Tibialis and Dorsalis pulse intact bilaterally: Yes Comments Thickened and yellowed toenails, especially on his second left toe where the toenail is currently upper back into the skin, able to clip it here in the office.     Follow up plan: Return in about 3 months (around 12/17/2023), or if symptoms worsen or fail to improve, for Diabetes recheck.  Counseling provided for all of the vaccine components Orders Placed This Encounter  Procedures   CBC with Differential/Platelet   CMP14+EGFR   Lipid panel   Bayer DCA Hb A1c Waived    Arville Care, MD [facility] 09/16/2023, 1:47 PM

## 2023-09-17 ENCOUNTER — Other Ambulatory Visit: Payer: Self-pay

## 2023-09-17 ENCOUNTER — Other Ambulatory Visit (HOSPITAL_COMMUNITY): Payer: Self-pay

## 2023-09-17 LAB — CBC WITH DIFFERENTIAL/PLATELET
Basophils Absolute: 0.1 10*3/uL (ref 0.0–0.2)
Basos: 1 %
EOS (ABSOLUTE): 0.2 10*3/uL (ref 0.0–0.4)
Eos: 3 %
Hematocrit: 56.5 % — ABNORMAL HIGH (ref 37.5–51.0)
Hemoglobin: 19.5 g/dL — ABNORMAL HIGH (ref 13.0–17.7)
Immature Grans (Abs): 0 10*3/uL (ref 0.0–0.1)
Immature Granulocytes: 1 %
Lymphocytes Absolute: 1.9 10*3/uL (ref 0.7–3.1)
Lymphs: 28 %
MCH: 31 pg (ref 26.6–33.0)
MCHC: 34.5 g/dL (ref 31.5–35.7)
MCV: 90 fL (ref 79–97)
Monocytes Absolute: 0.6 10*3/uL (ref 0.1–0.9)
Monocytes: 9 %
Neutrophils Absolute: 4 10*3/uL (ref 1.4–7.0)
Neutrophils: 58 %
Platelets: 136 10*3/uL — ABNORMAL LOW (ref 150–450)
RBC: 6.3 x10E6/uL — ABNORMAL HIGH (ref 4.14–5.80)
RDW: 13.9 % (ref 11.6–15.4)
WBC: 6.9 10*3/uL (ref 3.4–10.8)

## 2023-09-17 LAB — CMP14+EGFR
ALT: 34 [IU]/L (ref 0–44)
AST: 34 [IU]/L (ref 0–40)
Albumin: 4.6 g/dL (ref 3.9–4.9)
Alkaline Phosphatase: 80 [IU]/L (ref 44–121)
BUN/Creatinine Ratio: 12 (ref 10–24)
BUN: 16 mg/dL (ref 8–27)
Bilirubin Total: 1.8 mg/dL — ABNORMAL HIGH (ref 0.0–1.2)
CO2: 23 mmol/L (ref 20–29)
Calcium: 9.8 mg/dL (ref 8.6–10.2)
Chloride: 101 mmol/L (ref 96–106)
Creatinine, Ser: 1.32 mg/dL — ABNORMAL HIGH (ref 0.76–1.27)
Globulin, Total: 2.4 g/dL (ref 1.5–4.5)
Glucose: 166 mg/dL — ABNORMAL HIGH (ref 70–99)
Potassium: 4.8 mmol/L (ref 3.5–5.2)
Sodium: 139 mmol/L (ref 134–144)
Total Protein: 7 g/dL (ref 6.0–8.5)
eGFR: 60 mL/min/{1.73_m2} (ref >59)

## 2023-09-17 LAB — LIPID PANEL
Chol/HDL Ratio: 4.5 ratio (ref 0.0–5.0)
Cholesterol, Total: 112 mg/dL (ref 100–199)
HDL: 25 mg/dL — ABNORMAL LOW (ref >39)
LDL Chol Calc (NIH): 34 mg/dL (ref 0–99)
Triglycerides: 361 mg/dL — ABNORMAL HIGH (ref 0–149)
VLDL Cholesterol Cal: 53 mg/dL — ABNORMAL HIGH (ref 5–40)

## 2023-09-18 ENCOUNTER — Other Ambulatory Visit (HOSPITAL_COMMUNITY): Payer: Self-pay

## 2023-09-20 ENCOUNTER — Other Ambulatory Visit (HOSPITAL_COMMUNITY): Payer: Self-pay

## 2023-09-20 ENCOUNTER — Other Ambulatory Visit: Payer: Self-pay

## 2023-09-22 ENCOUNTER — Other Ambulatory Visit (HOSPITAL_COMMUNITY): Payer: Self-pay

## 2023-09-23 ENCOUNTER — Other Ambulatory Visit: Payer: Self-pay

## 2023-10-12 ENCOUNTER — Other Ambulatory Visit (HOSPITAL_COMMUNITY): Payer: Self-pay

## 2023-10-12 ENCOUNTER — Other Ambulatory Visit: Payer: Self-pay

## 2023-10-14 ENCOUNTER — Other Ambulatory Visit: Payer: Self-pay

## 2023-10-16 ENCOUNTER — Other Ambulatory Visit (HOSPITAL_COMMUNITY): Payer: Self-pay

## 2023-10-18 ENCOUNTER — Other Ambulatory Visit: Payer: Self-pay

## 2023-10-25 ENCOUNTER — Encounter (HOSPITAL_COMMUNITY): Payer: Self-pay

## 2023-10-25 ENCOUNTER — Other Ambulatory Visit: Payer: Self-pay

## 2023-10-25 ENCOUNTER — Other Ambulatory Visit (HOSPITAL_COMMUNITY): Payer: Self-pay

## 2023-10-26 ENCOUNTER — Other Ambulatory Visit (HOSPITAL_COMMUNITY): Payer: Self-pay

## 2023-10-26 ENCOUNTER — Other Ambulatory Visit: Payer: Self-pay

## 2023-10-27 ENCOUNTER — Other Ambulatory Visit: Payer: Self-pay

## 2023-11-10 ENCOUNTER — Other Ambulatory Visit (HOSPITAL_COMMUNITY): Payer: Self-pay

## 2023-11-11 ENCOUNTER — Encounter: Payer: Self-pay | Admitting: Pharmacist

## 2023-11-11 ENCOUNTER — Other Ambulatory Visit (HOSPITAL_BASED_OUTPATIENT_CLINIC_OR_DEPARTMENT_OTHER): Payer: Self-pay

## 2023-11-11 ENCOUNTER — Other Ambulatory Visit: Payer: Self-pay

## 2023-11-12 ENCOUNTER — Other Ambulatory Visit: Payer: Self-pay

## 2023-11-16 ENCOUNTER — Other Ambulatory Visit (HOSPITAL_COMMUNITY): Payer: Self-pay

## 2023-11-19 ENCOUNTER — Other Ambulatory Visit: Payer: Self-pay

## 2023-12-20 ENCOUNTER — Other Ambulatory Visit (HOSPITAL_COMMUNITY): Payer: Self-pay

## 2023-12-23 ENCOUNTER — Encounter: Payer: Self-pay | Admitting: Family Medicine

## 2023-12-23 ENCOUNTER — Ambulatory Visit (INDEPENDENT_AMBULATORY_CARE_PROVIDER_SITE_OTHER): Payer: 59 | Admitting: Family Medicine

## 2023-12-23 ENCOUNTER — Other Ambulatory Visit (HOSPITAL_COMMUNITY): Payer: Self-pay

## 2023-12-23 ENCOUNTER — Other Ambulatory Visit: Payer: Self-pay

## 2023-12-23 VITALS — BP 117/75 | HR 74 | Temp 97.2°F | Wt 238.4 lb

## 2023-12-23 DIAGNOSIS — N183 Chronic kidney disease, stage 3 unspecified: Secondary | ICD-10-CM | POA: Diagnosis not present

## 2023-12-23 DIAGNOSIS — E1122 Type 2 diabetes mellitus with diabetic chronic kidney disease: Secondary | ICD-10-CM | POA: Diagnosis not present

## 2023-12-23 DIAGNOSIS — E785 Hyperlipidemia, unspecified: Secondary | ICD-10-CM | POA: Diagnosis not present

## 2023-12-23 DIAGNOSIS — Z7985 Long-term (current) use of injectable non-insulin antidiabetic drugs: Secondary | ICD-10-CM | POA: Diagnosis not present

## 2023-12-23 DIAGNOSIS — I10 Essential (primary) hypertension: Secondary | ICD-10-CM

## 2023-12-23 DIAGNOSIS — E1129 Type 2 diabetes mellitus with other diabetic kidney complication: Secondary | ICD-10-CM

## 2023-12-23 DIAGNOSIS — F411 Generalized anxiety disorder: Secondary | ICD-10-CM

## 2023-12-23 DIAGNOSIS — Z125 Encounter for screening for malignant neoplasm of prostate: Secondary | ICD-10-CM | POA: Diagnosis not present

## 2023-12-23 DIAGNOSIS — E782 Mixed hyperlipidemia: Secondary | ICD-10-CM

## 2023-12-23 DIAGNOSIS — R809 Proteinuria, unspecified: Secondary | ICD-10-CM

## 2023-12-23 LAB — CBC WITH DIFFERENTIAL/PLATELET

## 2023-12-23 LAB — BAYER DCA HB A1C WAIVED: HB A1C (BAYER DCA - WAIVED): 7.4 % — ABNORMAL HIGH (ref 4.8–5.6)

## 2023-12-23 MED ORDER — ALPRAZOLAM 1 MG PO TABS
1.0000 mg | ORAL_TABLET | Freq: Three times a day (TID) | ORAL | 2 refills | Status: DC | PRN
  Filled 2023-12-23: qty 90, 30d supply, fill #0
  Filled 2024-01-21: qty 90, 30d supply, fill #1
  Filled 2024-02-21 – 2024-02-25 (×2): qty 90, 30d supply, fill #2

## 2023-12-23 MED ORDER — TAMSULOSIN HCL 0.4 MG PO CAPS
0.4000 mg | ORAL_CAPSULE | Freq: Two times a day (BID) | ORAL | 11 refills | Status: AC
  Filled 2023-12-23: qty 60, 30d supply, fill #0
  Filled 2024-01-18: qty 60, 30d supply, fill #1
  Filled 2024-02-21 – 2024-02-25 (×2): qty 60, 30d supply, fill #2
  Filled 2024-03-24: qty 60, 30d supply, fill #3
  Filled 2024-04-21: qty 60, 30d supply, fill #4
  Filled 2024-05-20: qty 60, 30d supply, fill #5
  Filled 2024-06-23: qty 60, 30d supply, fill #6
  Filled 2024-07-20: qty 60, 30d supply, fill #7
  Filled 2024-08-21: qty 60, 30d supply, fill #8
  Filled 2024-09-18: qty 60, 30d supply, fill #9
  Filled 2024-10-19: qty 60, 30d supply, fill #10
  Filled 2024-11-15: qty 60, 30d supply, fill #11

## 2023-12-23 MED ORDER — SEMAGLUTIDE (2 MG/DOSE) 8 MG/3ML ~~LOC~~ SOPN
2.0000 mg | PEN_INJECTOR | SUBCUTANEOUS | 3 refills | Status: DC
  Filled 2023-12-23: qty 3, 28d supply, fill #0
  Filled 2024-01-18: qty 3, 28d supply, fill #1
  Filled 2024-02-13: qty 3, 28d supply, fill #2
  Filled 2024-03-20 – 2024-03-24 (×2): qty 3, 28d supply, fill #3
  Filled 2024-04-19: qty 3, 28d supply, fill #4
  Filled 2024-05-20: qty 3, 28d supply, fill #5
  Filled 2024-06-14: qty 3, 28d supply, fill #6
  Filled 2024-07-12: qty 3, 28d supply, fill #7
  Filled 2024-08-09: qty 3, 28d supply, fill #8
  Filled 2024-09-01: qty 3, 28d supply, fill #9
  Filled 2024-09-29: qty 3, 28d supply, fill #10
  Filled 2024-10-30: qty 3, 28d supply, fill #11

## 2023-12-23 MED ORDER — ROSUVASTATIN CALCIUM 40 MG PO TABS
40.0000 mg | ORAL_TABLET | Freq: Every day | ORAL | 3 refills | Status: AC
  Filled 2023-12-23 – 2024-02-13 (×2): qty 90, 90d supply, fill #0
  Filled 2024-05-14: qty 90, 90d supply, fill #1
  Filled 2024-08-08: qty 90, 90d supply, fill #2
  Filled 2024-11-06: qty 90, 90d supply, fill #3

## 2023-12-23 NOTE — Progress Notes (Signed)
BP 117/75   Pulse 74   Temp (!) 97.2 F (36.2 C)   Wt 238 lb 6.4 oz (108.1 kg)   SpO2 91%   BMI 34.21 kg/m    Subjective:   Patient ID: Francisco Allen, male    DOB: [birth year], 63 y.o.   MRN: [id number]  HPI: Francisco Allen is a 63 y.o. male presenting on 12/23/2023 for Follow-up (3 week follow up. )   HPI Type 2 diabetes mellitus Patient comes in today for recheck of his diabetes. Patient has been currently taking Ozempic. Patient is currently on an ACE inhibitor/ARB. Patient has not seen an ophthalmologist this year. Patient denies any new issues with their feet. The symptom started onset as an adult hypertension and hyperlipidemia and CKD ARE RELATED TO DM   Hyperlipidemia Patient is coming in for recheck of his hyperlipidemia. The patient is currently taking fish oils and Crestor. They deny any issues with myalgias or history of liver damage from it. They deny any focal numbness or weakness or chest pain.   Hypertension Patient is currently on carvedilol and lisinopril, and their blood pressure today is 117/75. Patient denies any lightheadedness or dizziness. Patient denies headaches, blurred vision, chest pains, shortness of breath, or weakness. Denies any side effects from medication and is content with current medication.   Anxiety recheck Current rx-alprazolam 1 mg 3 times daily as needed # meds rx-90/month Effectiveness of current meds-works well Adverse reactions form meds-none Pill count performed-No Last drug screen -03/29/2023 ( high risk q16m, moderate risk q61m, low risk yearly ) Urine drug screen today- No Was the NCCSR reviewed-yes  If yes were their any concerning findings? -None Controlled substance contract signed on: 03/29/2023  Relevant past medical, surgical, family and social history reviewed and updated as indicated. Interim medical history since our last visit reviewed. Allergies and medications reviewed and updated.  Review of Systems   Constitutional:  Negative for chills and fever.  Eyes:  Negative for discharge.  Respiratory:  Negative for shortness of breath and wheezing.   Cardiovascular:  Negative for chest pain and leg swelling.  Musculoskeletal:  Negative for back pain and gait problem.  Skin:  Negative for rash.  Neurological:  Negative for dizziness and light-headedness.  Psychiatric/Behavioral:  Positive for dysphoric mood. Negative for self-injury, sleep disturbance and suicidal ideas. The patient is nervous/anxious.   All other systems reviewed and are negative.   Per HPI unless specifically indicated above   Allergies as of 12/23/2023       Reactions   Bee Venom Anaphylaxis, Swelling, Other (See Comments)   Anaphylactic shock   Penicillins Anaphylaxis, Swelling   Airway closes up Has patient had a PCN reaction causing immediate rash, facial/tongue/throat swelling, SOB or lightheadedness with hypotension: Yes Has patient had a PCN reaction causing severe rash involving mucus membranes or skin necrosis: Yes Has patient had a PCN reaction that required hospitalization: [name] Has patient had a PCN reaction occurring within the last 10 years: No If all of the above answers are "NO", then may proceed with Cephalosporin use.        Medication List        Accurate as of December 23, 2023  9:05 AM. If you have any questions, ask your nurse or doctor.          STOP taking these medications    Ozempic (1 MG/DOSE) 4 MG/3ML Sopn Generic drug: Semaglutide (1 MG/DOSE) Replaced by: Semaglutide (2 MG/DOSE) 8 MG/3ML Sopn Stopped  by: Elige Radon [place] Karl       TAKE these medications    ALPRAZolam 1 MG tablet Commonly known as: XANAX Take 1 tablet (1 mg total) by mouth 3 (three) times daily as needed for anxiety.   carvedilol 6.25 MG tablet Commonly known as: COREG Take 1 tablet (6.25 mg total) by mouth in the morning and in the evening with a meal   diclofenac Sodium 1 % Gel Commonly known as:  Voltaren Apply 2-4 g topically 4 (four) times daily.   Fish Oil 1000 MG Caps Take 1,000 mg by mouth daily. 4 daily  Ubiquinol   lisinopril 2.5 MG tablet Commonly known as: ZESTRIL Take 1 tablet (2.5 mg total) by mouth daily.   MENS 50+ MULTI VITAMIN/MIN PO Take 1 tablet by mouth daily.   rosuvastatin 40 MG tablet Commonly known as: CRESTOR Take 1 tablet (40 mg total) by mouth daily.   Semaglutide (2 MG/DOSE) 8 MG/3ML Sopn Inject 2 mg as directed once a week. Replaces: Ozempic (1 MG/DOSE) 4 MG/3ML Sopn Started by: Elige Radon [name]   tamsulosin 0.4 MG Caps capsule Commonly known as: FLOMAX Take 1 capsule (0.4 mg total) by mouth 2 (two) times daily.         Objective:   BP 117/75   Pulse 74   Temp (!) 97.2 F (36.2 C)   Wt 238 lb 6.4 oz (108.1 kg)   SpO2 91%   BMI 34.21 kg/m   Wt Readings from Last 3 Encounters:  12/23/23 238 lb 6.4 oz (108.1 kg)  09/16/23 245 lb (111.1 kg)  06/16/23 242 lb (109.8 kg)    Physical Exam Vitals reviewed.  Constitutional:      General: He is not in acute distress.    Appearance: He is well-developed. He is not diaphoretic.  Eyes:     General: No scleral icterus.    Conjunctiva/sclera: Conjunctivae normal.  Neck:     Thyroid: No thyromegaly.  Cardiovascular:     Rate and Rhythm: Normal rate and regular rhythm.     Heart sounds: Normal heart sounds. No murmur heard. Pulmonary:     Effort: Pulmonary effort is normal. No respiratory distress.     Breath sounds: Normal breath sounds. No wheezing.  Musculoskeletal:        General: No swelling.     Cervical back: Neck supple.  Lymphadenopathy:     Cervical: No cervical adenopathy.  Skin:    General: Skin is warm and dry.     Findings: No rash.  Neurological:     Mental Status: He is alert and oriented to person, place, and time.     Coordination: Coordination normal.  Psychiatric:        Behavior: Behavior normal.       Assessment & Plan:   Problem List Items  Addressed This Visit       Cardiovascular and Mediastinum   HTN (hypertension)   Relevant Medications   rosuvastatin (CRESTOR) 40 MG tablet   Other Relevant Orders   CBC with Differential/Platelet   CMP14+EGFR   Lipid panel   Bayer DCA Hb A1c Waived     Endocrine   Type 2 diabetes mellitus with microalbuminuria (HCC)   Relevant Medications   rosuvastatin (CRESTOR) 40 MG tablet   Semaglutide, 2 MG/DOSE, 8 MG/3ML SOPN   CKD stage 3 due to type 2 diabetes mellitus (HCC) - Primary   Relevant Medications   rosuvastatin (CRESTOR) 40 MG tablet   Semaglutide, 2 MG/DOSE, 8  MG/3ML SOPN   Other Relevant Orders   CBC with Differential/Platelet   CMP14+EGFR   Lipid panel   Bayer DCA Hb A1c Waived   Microalbumin/Creatinine Ratio, Urine     Other   GAD (generalized anxiety disorder)   Relevant Medications   ALPRAZolam (XANAX) 1 MG tablet   Hyperlipidemia   Relevant Medications   rosuvastatin (CRESTOR) 40 MG tablet   Other Relevant Orders   CBC with Differential/Platelet   CMP14+EGFR   Lipid panel   Bayer DCA Hb A1c Waived   Other Visit Diagnoses       Type 2 diabetes mellitus with microalbuminuria, without long-term current use of insulin (HCC)       Relevant Medications   rosuvastatin (CRESTOR) 40 MG tablet   Semaglutide, 2 MG/DOSE, 8 MG/3ML SOPN   Other Relevant Orders   Microalbumin/Creatinine Ratio, Urine     Prostate cancer screening       Relevant Orders   PSA, total and free       A1c 7.4, slightly improved but still elevated.  Will increase the Ozempic to 2 mg weekly.  Will check blood work, no other changes, blood pressure looks good as well. Follow up plan: Return in about 3 months (around 03/21/2024), or if symptoms worsen or fail to improve, for Diabetes hypertension and anxiety.  Counseling provided for all of the vaccine components Orders Placed This Encounter  Procedures   CBC with Differential/Platelet   CMP14+EGFR   Lipid panel   Bayer DCA Hb A1c  Waived   PSA, total and free   Microalbumin/Creatinine Ratio, Urine    Arville Care, MD [facility] 12/23/2023, 9:05 AM

## 2023-12-24 ENCOUNTER — Encounter: Payer: Self-pay | Admitting: Family Medicine

## 2023-12-24 LAB — CBC WITH DIFFERENTIAL/PLATELET
Basos: 1 %
EOS (ABSOLUTE): 0 10*3/uL (ref 0.0–0.2)
Eos: 1 %
Hematocrit: 56.2 % — ABNORMAL HIGH (ref 37.5–51.0)
Hemoglobin: 18.4 g/dL — ABNORMAL HIGH (ref 13.0–17.7)
Immature Granulocytes: 0 10*3/uL (ref 0.0–0.1)
Immature Granulocytes: 1 %
Lymphs: 18 %
MCH: 29.6 pg (ref 26.6–33.0)
MCHC: 32.7 g/dL (ref 31.5–35.7)
MCV: 91 fL (ref 79–97)
Monocytes Absolute: 0 10*3/uL (ref 0.0–0.4)
Monocytes Absolute: 0.9 10*3/uL (ref 0.1–0.9)
Monocytes: 15 %
Neutrophils Absolute: 1.1 10*3/uL (ref 0.7–3.1)
Neutrophils Absolute: 3.7 10*3/uL (ref 1.4–7.0)
Neutrophils: 64 %
Platelets: 96 10*3/uL — CL (ref 150–450)
RBC: 6.21 x10E6/uL — ABNORMAL HIGH (ref 4.14–5.80)
RDW: 13.2 % (ref 11.6–15.4)
WBC: 5.8 10*3/uL (ref 3.4–10.8)

## 2023-12-24 LAB — LIPID PANEL
Chol/HDL Ratio: 3.3 {ratio} (ref 0.0–5.0)
Cholesterol, Total: 66 mg/dL — ABNORMAL LOW (ref 100–199)
HDL: 20 mg/dL — ABNORMAL LOW (ref >39)
LDL Chol Calc (NIH): 12 mg/dL (ref 0–99)
Triglycerides: 217 mg/dL — ABNORMAL HIGH (ref 0–149)
VLDL Cholesterol Cal: 34 mg/dL (ref 5–40)

## 2023-12-24 LAB — CMP14+EGFR
ALT: 24 [IU]/L (ref 0–44)
AST: 24 [IU]/L (ref 0–40)
Albumin: 4.1 g/dL (ref 3.9–4.9)
Alkaline Phosphatase: 82 [IU]/L (ref 44–121)
BUN/Creatinine Ratio: 11 (ref 10–24)
BUN: 15 mg/dL (ref 8–27)
Bilirubin Total: 1.4 mg/dL — ABNORMAL HIGH (ref 0.0–1.2)
CO2: 23 mmol/L (ref 20–29)
Calcium: 8.6 mg/dL (ref 8.6–10.2)
Chloride: 102 mmol/L (ref 96–106)
Creatinine, Ser: 1.39 mg/dL — ABNORMAL HIGH (ref 0.76–1.27)
Globulin, Total: 2.3 g/dL (ref 1.5–4.5)
Glucose: 189 mg/dL — ABNORMAL HIGH (ref 70–99)
Potassium: 4.2 mmol/L (ref 3.5–5.2)
Sodium: 140 mmol/L (ref 134–144)
Total Protein: 6.4 g/dL (ref 6.0–8.5)
eGFR: 57 mL/min/{1.73_m2} — ABNORMAL LOW (ref >59)

## 2023-12-24 LAB — PSA, TOTAL AND FREE
PSA, Free Pct: 24.4 %
PSA, Free: 0.39 ng/mL
Prostate Specific Ag, Serum: 1.6 ng/mL (ref 0.0–4.0)

## 2023-12-25 LAB — MICROALBUMIN / CREATININE URINE RATIO
Creatinine, Urine: 171.1 mg/dL
Microalb/Creat Ratio: 428 mg/g{creat} — ABNORMAL HIGH (ref 0–29)
Microalbumin, Urine: 731.5 ug/mL

## 2024-01-05 ENCOUNTER — Other Ambulatory Visit (HOSPITAL_COMMUNITY): Payer: Self-pay

## 2024-01-18 ENCOUNTER — Other Ambulatory Visit: Payer: Self-pay

## 2024-01-19 ENCOUNTER — Other Ambulatory Visit (HOSPITAL_COMMUNITY): Payer: Self-pay

## 2024-01-20 ENCOUNTER — Other Ambulatory Visit (HOSPITAL_COMMUNITY): Payer: Self-pay

## 2024-01-22 ENCOUNTER — Other Ambulatory Visit (HOSPITAL_COMMUNITY): Payer: Self-pay

## 2024-02-14 ENCOUNTER — Other Ambulatory Visit: Payer: Self-pay

## 2024-02-14 ENCOUNTER — Other Ambulatory Visit (HOSPITAL_COMMUNITY): Payer: Self-pay

## 2024-02-21 ENCOUNTER — Other Ambulatory Visit: Payer: Self-pay

## 2024-02-24 ENCOUNTER — Other Ambulatory Visit (HOSPITAL_COMMUNITY): Payer: Self-pay

## 2024-02-25 ENCOUNTER — Other Ambulatory Visit (HOSPITAL_COMMUNITY): Payer: Self-pay

## 2024-02-28 ENCOUNTER — Ambulatory Visit (INDEPENDENT_AMBULATORY_CARE_PROVIDER_SITE_OTHER)

## 2024-02-28 ENCOUNTER — Ambulatory Visit

## 2024-02-28 DIAGNOSIS — E1129 Type 2 diabetes mellitus with other diabetic kidney complication: Secondary | ICD-10-CM | POA: Diagnosis not present

## 2024-02-28 DIAGNOSIS — R809 Proteinuria, unspecified: Secondary | ICD-10-CM

## 2024-02-28 DIAGNOSIS — Z7985 Long-term (current) use of injectable non-insulin antidiabetic drugs: Secondary | ICD-10-CM

## 2024-02-28 LAB — HM DIABETES EYE EXAM

## 2024-02-28 NOTE — Progress Notes (Signed)
 Francisco Allen arrived 02/28/2024 and has given verbal consent to obtain images and complete their overdue diabetic retinal screening.  The images have been sent to an ophthalmologist or optometrist for review and interpretation.  Results will be sent back to Dettinger, Lucio Sabin, MD for review.  Patient has been informed they will be contacted when we receive the results via telephone or MyChart

## 2024-03-06 ENCOUNTER — Encounter: Payer: Self-pay | Admitting: Family Medicine

## 2024-03-17 ENCOUNTER — Encounter (HOSPITAL_COMMUNITY): Payer: Self-pay

## 2024-03-21 ENCOUNTER — Other Ambulatory Visit (HOSPITAL_COMMUNITY): Payer: Self-pay

## 2024-03-24 ENCOUNTER — Encounter: Payer: Self-pay | Admitting: Family Medicine

## 2024-03-24 ENCOUNTER — Ambulatory Visit: Payer: 59 | Admitting: Family Medicine

## 2024-03-24 ENCOUNTER — Other Ambulatory Visit (HOSPITAL_COMMUNITY): Payer: Self-pay

## 2024-03-24 ENCOUNTER — Other Ambulatory Visit: Payer: Self-pay

## 2024-03-24 VITALS — BP 133/83 | HR 84 | Ht 70.0 in | Wt 233.0 lb

## 2024-03-24 DIAGNOSIS — E782 Mixed hyperlipidemia: Secondary | ICD-10-CM

## 2024-03-24 DIAGNOSIS — I10 Essential (primary) hypertension: Secondary | ICD-10-CM | POA: Diagnosis not present

## 2024-03-24 DIAGNOSIS — E1122 Type 2 diabetes mellitus with diabetic chronic kidney disease: Secondary | ICD-10-CM | POA: Diagnosis not present

## 2024-03-24 DIAGNOSIS — F411 Generalized anxiety disorder: Secondary | ICD-10-CM | POA: Diagnosis not present

## 2024-03-24 DIAGNOSIS — E1129 Type 2 diabetes mellitus with other diabetic kidney complication: Secondary | ICD-10-CM | POA: Diagnosis not present

## 2024-03-24 DIAGNOSIS — N183 Chronic kidney disease, stage 3 unspecified: Secondary | ICD-10-CM | POA: Diagnosis not present

## 2024-03-24 DIAGNOSIS — R809 Proteinuria, unspecified: Secondary | ICD-10-CM | POA: Diagnosis not present

## 2024-03-24 DIAGNOSIS — Z7985 Long-term (current) use of injectable non-insulin antidiabetic drugs: Secondary | ICD-10-CM | POA: Diagnosis not present

## 2024-03-24 LAB — BAYER DCA HB A1C WAIVED: HB A1C (BAYER DCA - WAIVED): 6.1 % — ABNORMAL HIGH (ref 4.8–5.6)

## 2024-03-24 LAB — LIPID PANEL

## 2024-03-24 MED ORDER — ALPRAZOLAM 1 MG PO TABS
1.0000 mg | ORAL_TABLET | Freq: Three times a day (TID) | ORAL | 2 refills | Status: DC | PRN
  Filled 2024-03-24: qty 90, 30d supply, fill #0

## 2024-03-24 MED ORDER — ALPRAZOLAM 1 MG PO TABS
1.0000 mg | ORAL_TABLET | Freq: Three times a day (TID) | ORAL | 2 refills | Status: DC | PRN
Start: 2024-03-24 — End: 2024-06-28

## 2024-03-24 NOTE — Addendum Note (Signed)
 Addended by: Jolyne Needs on: 03/24/2024 09:10 AM   Modules accepted: Orders

## 2024-03-24 NOTE — Progress Notes (Signed)
 BP 133/83   Pulse 84   Ht 5\' 10"  (1.778 m)   Wt 233 lb (105.7 kg)   SpO2 96%   BMI 33.43 kg/m    Subjective:   Patient ID: Francisco Allen, male    DOB: Mar 11, 1961, 63 y.o.   MRN: [id number]  HPI: Francisco Allen is a 63 y.o. male presenting on 03/24/2024 for Medical Management of Chronic Issues, Chronic Kidney Disease, Hypertension, Hyperlipidemia, and Diabetes   HPI Type 2 diabetes mellitus Patient comes in today for recheck of his diabetes. Patient has been currently taking Ozempic . Patient is currently on an ACE inhibitor/ARB. Patient has seen an ophthalmologist this year. Patient denies any new issues with their feet. The symptom started onset as an adult CKD and hypertension and hyperlipidemia and microalbuminuria ARE RELATED TO DM   Hypertension Patient is currently on lisinopril  and carvedilol , and their blood pressure today is 133/83. Patient denies any lightheadedness or dizziness. Patient denies headaches, blurred vision, chest pains, shortness of breath, or weakness. Denies any side effects from medication and is content with current medication.   Hyperlipidemia Patient is coming in for recheck of his hyperlipidemia. The patient is currently taking Crestor  and fish oils. They deny any issues with myalgias or history of liver damage from it. They deny any focal numbness or weakness or chest pain.   Anxiety recheck Current rx-alprazolam  1 mg 3 times daily as needed # meds rx-90/month Effectiveness of current meds-works well, attempted to taper off in the past but was unable to Adverse reactions form meds-none Pill count performed-No Last drug screen -03/29/2023 ( high risk q70m, moderate risk q34m, low risk yearly ) Urine drug screen today- Yes Was the NCCSR reviewed-yes  If yes were their any concerning findings? -None Controlled substance contract signed on: Today  Relevant past medical, surgical, family and social history reviewed and updated as indicated. Interim  medical history since our last visit reviewed. Allergies and medications reviewed and updated.  Review of Systems  Constitutional:  Negative for chills and fever.  Eyes:  Negative for visual disturbance.  Respiratory:  Negative for shortness of breath and wheezing.   Cardiovascular:  Negative for chest pain and leg swelling.  Musculoskeletal:  Negative for back pain and gait problem.  Skin:  Negative for rash.  Neurological:  Negative for dizziness and light-headedness.  All other systems reviewed and are negative.   Per HPI unless specifically indicated above   Allergies as of 03/24/2024       Reactions   Bee Venom Anaphylaxis, Swelling, Other (See Comments)   Anaphylactic shock   Penicillins Anaphylaxis, Swelling   Airway closes up Has patient had a PCN reaction causing immediate rash, facial/tongue/throat swelling, SOB or lightheadedness with hypotension: Yes Has patient had a PCN reaction causing severe rash involving mucus membranes or skin necrosis: Yes Has patient had a PCN reaction that required hospitalization: Yvette Hercules Has patient had a PCN reaction occurring within the last 10 years: No If all of the above answers are "NO", then may proceed with Cephalosporin use.        Medication List        Accurate as of Mar 24, 2024  9:05 AM. If you have any questions, ask your nurse or doctor.          ALPRAZolam  1 MG tablet Commonly known as: XANAX  Take 1 tablet (1 mg total) by mouth 3 (three) times daily as needed for anxiety.   carvedilol  6.25 MG tablet Commonly  known as: COREG  Take 1 tablet (6.25 mg total) by mouth in the morning and in the evening with a meal   diclofenac  Sodium 1 % Gel Commonly known as: Voltaren  Apply 2-4 g topically 4 (four) times daily.   Fish Oil 1000 MG Caps Take 1,000 mg by mouth daily. 4 daily  Ubiquinol   lisinopril  2.5 MG tablet Commonly known as: ZESTRIL  Take 1 tablet (2.5 mg total) by mouth daily.   MENS 50+ MULTI VITAMIN/MIN  PO Take 1 tablet by mouth daily.   Ozempic  (2 MG/DOSE) 8 MG/3ML Sopn Generic drug: Semaglutide  (2 MG/DOSE) Inject 2 mg as directed once a week.   rosuvastatin  40 MG tablet Commonly known as: CRESTOR  Take 1 tablet (40 mg total) by mouth daily.   tamsulosin  0.4 MG Caps capsule Commonly known as: FLOMAX  Take 1 capsule (0.4 mg total) by mouth 2 (two) times daily.         Objective:   BP 133/83   Pulse 84   Ht 5\' 10"  (1.778 m)   Wt 233 lb (105.7 kg)   SpO2 96%   BMI 33.43 kg/m   Wt Readings from Last 3 Encounters:  03/24/24 233 lb (105.7 kg)  12/23/23 238 lb 6.4 oz (108.1 kg)  09/16/23 245 lb (111.1 kg)    Physical Exam Vitals and nursing note reviewed.  Constitutional:      General: He is not in acute distress.    Appearance: He is well-developed. He is not diaphoretic.  Eyes:     General: No scleral icterus.    Conjunctiva/sclera: Conjunctivae normal.  Neck:     Thyroid : No thyromegaly.  Cardiovascular:     Rate and Rhythm: Normal rate and regular rhythm.     Heart sounds: Normal heart sounds. No murmur heard. Pulmonary:     Effort: Pulmonary effort is normal. No respiratory distress.     Breath sounds: Normal breath sounds. No wheezing.  Musculoskeletal:        General: No swelling. Normal range of motion.     Cervical back: Neck supple.  Lymphadenopathy:     Cervical: No cervical adenopathy.  Skin:    General: Skin is warm and dry.     Findings: No rash.  Neurological:     Mental Status: He is alert and oriented to person, place, and time.     Coordination: Coordination normal.  Psychiatric:        Behavior: Behavior normal.     Results for orders placed or performed in visit on 03/06/24  HM DIABETES EYE EXAM   Collection Time: 02/28/24 11:51 AM  Result Value Ref Range   HM Diabetic Eye Exam No Retinopathy No Retinopathy    Assessment & Plan:   Problem List Items Addressed This Visit       Cardiovascular and Mediastinum   HTN  (hypertension)   Relevant Orders   Bayer DCA Hb A1c Waived   CBC with Differential/Platelet   CMP14+EGFR   Lipid panel     Endocrine   Type 2 diabetes mellitus with microalbuminuria (HCC) - Primary   Relevant Orders   Bayer DCA Hb A1c Waived   CBC with Differential/Platelet   CMP14+EGFR   Lipid panel   CKD stage 3 due to type 2 diabetes mellitus (HCC)   Relevant Orders   Bayer DCA Hb A1c Waived   CBC with Differential/Platelet   CMP14+EGFR   Lipid panel     Other   GAD (generalized anxiety disorder)   Relevant  Medications   ALPRAZolam  (XANAX ) 1 MG tablet   Other Relevant Orders   ToxASSURE Select 13 (MW), Urine   Hyperlipidemia   Relevant Orders   Bayer DCA Hb A1c Waived   CBC with Differential/Platelet   CMP14+EGFR   Lipid panel    A1c looks good at 6.1. Blood pressure everything else looks good, will do refills, encouraged to quit smoking Follow up plan: Return in about 3 months (around 06/24/2024), or if symptoms worsen or fail to improve, for Diabetes and hypertension and hyperlipidemia.  Counseling provided for all of the vaccine components Orders Placed This Encounter  Procedures   Bayer DCA Hb A1c Waived   CBC with Differential/Platelet   CMP14+EGFR   Lipid panel   ToxASSURE Select 13 (MW), Urine    Jolyne Needs, MD [facility] 03/24/2024, 9:05 AM

## 2024-03-25 LAB — CMP14+EGFR
ALT: 24 IU/L (ref 0–44)
AST: 22 IU/L (ref 0–40)
Albumin: 4.3 g/dL (ref 3.9–4.9)
Alkaline Phosphatase: 70 IU/L (ref 44–121)
BUN/Creatinine Ratio: 13 (ref 10–24)
BUN: 13 mg/dL (ref 8–27)
Bilirubin Total: 1.2 mg/dL (ref 0.0–1.2)
CO2: 17 mmol/L — ABNORMAL LOW (ref 20–29)
Calcium: 9.7 mg/dL (ref 8.6–10.2)
Chloride: 103 mmol/L (ref 96–106)
Creatinine, Ser: 1.04 mg/dL (ref 0.76–1.27)
Globulin, Total: 2.4 g/dL (ref 1.5–4.5)
Glucose: 164 mg/dL — ABNORMAL HIGH (ref 70–99)
Potassium: 4.3 mmol/L (ref 3.5–5.2)
Sodium: 141 mmol/L (ref 134–144)
Total Protein: 6.7 g/dL (ref 6.0–8.5)
eGFR: 80 mL/min/{1.73_m2} (ref >59)

## 2024-03-25 LAB — CBC WITH DIFFERENTIAL/PLATELET
Basophils Absolute: 0.1 10*3/uL (ref 0.0–0.2)
Basos: 1 %
EOS (ABSOLUTE): 0.1 10*3/uL (ref 0.0–0.4)
Eos: 2 %
Hematocrit: 60.4 % — ABNORMAL HIGH (ref 37.5–51.0)
Hemoglobin: 19.5 g/dL — ABNORMAL HIGH (ref 13.0–17.7)
Immature Grans (Abs): 0.1 10*3/uL (ref 0.0–0.1)
Immature Granulocytes: 1 %
Lymphocytes Absolute: 1.8 10*3/uL (ref 0.7–3.1)
Lymphs: 24 %
MCH: 29.8 pg (ref 26.6–33.0)
MCHC: 32.3 g/dL (ref 31.5–35.7)
MCV: 92 fL (ref 79–97)
Monocytes Absolute: 0.5 10*3/uL (ref 0.1–0.9)
Monocytes: 7 %
Neutrophils Absolute: 5 10*3/uL (ref 1.4–7.0)
Neutrophils: 65 %
Platelets: 132 10*3/uL — ABNORMAL LOW (ref 150–450)
RBC: 6.55 x10E6/uL — ABNORMAL HIGH (ref 4.14–5.80)
RDW: 14.2 % (ref 11.6–15.4)
WBC: 7.5 10*3/uL (ref 3.4–10.8)

## 2024-03-25 LAB — LIPID PANEL
Chol/HDL Ratio: 3.9 ratio (ref 0.0–5.0)
Cholesterol, Total: 94 mg/dL — ABNORMAL LOW (ref 100–199)
HDL: 24 mg/dL — ABNORMAL LOW (ref >39)
LDL Chol Calc (NIH): 19 mg/dL (ref 0–99)
Triglycerides: 364 mg/dL — ABNORMAL HIGH (ref 0–149)
VLDL Cholesterol Cal: 51 mg/dL — ABNORMAL HIGH (ref 5–40)

## 2024-03-26 LAB — TOXASSURE SELECT 13 (MW), URINE

## 2024-04-04 ENCOUNTER — Other Ambulatory Visit (HOSPITAL_COMMUNITY): Payer: Self-pay

## 2024-04-05 ENCOUNTER — Ambulatory Visit: Payer: Self-pay | Admitting: Family Medicine

## 2024-04-06 ENCOUNTER — Other Ambulatory Visit: Payer: Self-pay

## 2024-04-06 DIAGNOSIS — D696 Thrombocytopenia, unspecified: Secondary | ICD-10-CM

## 2024-04-19 ENCOUNTER — Other Ambulatory Visit (HOSPITAL_COMMUNITY): Payer: Self-pay

## 2024-04-21 ENCOUNTER — Other Ambulatory Visit (HOSPITAL_COMMUNITY): Payer: Self-pay

## 2024-04-27 ENCOUNTER — Ambulatory Visit

## 2024-04-27 ENCOUNTER — Ambulatory Visit: Admitting: Family Medicine

## 2024-04-27 ENCOUNTER — Encounter: Payer: Self-pay | Admitting: Family Medicine

## 2024-04-27 VITALS — BP 137/81 | HR 76 | Ht 70.0 in | Wt 233.0 lb

## 2024-04-27 DIAGNOSIS — M1711 Unilateral primary osteoarthritis, right knee: Secondary | ICD-10-CM | POA: Diagnosis not present

## 2024-04-27 MED ORDER — METHYLPREDNISOLONE ACETATE 80 MG/ML IJ SUSP: 80.0000 mg | Freq: Once | INTRAMUSCULAR | Status: AC

## 2024-04-27 NOTE — Progress Notes (Signed)
 Acute Office Visit  Subjective:     Patient ID: Francisco Allen, male    DOB: 1961-06-22, 63 y.o.   MRN: [id number]  Chief Complaint  Patient presents with   Knee Pain    Right. Requesting injection.    HPI Patient is in today for right knee pain. Patent reports that they are experiencing sharp pain in the right knee for few months, with significantly worsening symptoms for pas couple weeks. He mentions that he has a knee surgery on left knee in 2021, and ever since he has been putting more weight on the right knee which may have exacerbated his symptoms. He describes the pain as sharp and mentions that it is always present. The pain is usually localized to the medial aspect of the knee, but he also states that he feels the pain radiating underneath his knee cap towards the lateral aspect. Physical activity involving the use of knee aggravates the pain and OTC medication provides some relief. He denies any new falls, injuries, twisting, bending, or popping. He describes the pain as moderate to severe.    Review of Systems  Constitutional:  Negative for chills, fever and weight loss.  HENT:  Negative for congestion, ear pain, hearing loss and sore throat.   Eyes:  Negative for blurred vision, double vision and pain.  Respiratory:  Negative for cough and shortness of breath.   Cardiovascular:  Negative for chest pain, palpitations and leg swelling.  Gastrointestinal:  Negative for abdominal pain, constipation, diarrhea, heartburn, nausea and vomiting.  Genitourinary:  Negative for dysuria, frequency and urgency.  Musculoskeletal:  Positive for joint pain (Right Knee). Negative for myalgias.  Skin:  Negative for itching and rash.  Neurological:  Negative for dizziness, weakness and headaches.  Psychiatric/Behavioral:  Negative for depression.         Objective:    BP 137/81   Pulse 76   Ht 5' 10 (1.778 m)   Wt 233 lb (105.7 kg)   SpO2 96%   BMI 33.43 kg/m     Physical  Exam Constitutional:      Appearance: Normal appearance.  HENT:     Head: Normocephalic and atraumatic.   Musculoskeletal:        General: Tenderness (Medial aspect of right knee) present. Normal range of motion.     Right knee: Crepitus present. Tenderness present over the medial joint line.   Skin:    General: Skin is warm and dry.   Neurological:     Mental Status: He is alert.   Psychiatric:        Mood and Affect: Mood normal.        Behavior: Behavior normal.     No results found for any visits on 04/27/24.      Assessment & Plan:   Problem List Items Addressed This Visit   None Visit Diagnoses       Primary osteoarthritis of right knee    -  Primary   Relevant Medications   methylPREDNISolone  acetate (DEPO-MEDROL ) injection 80 mg       (1) Right knee pain - Assessment: The symptoms are most likely caused by worsening osteoarthritis. Lack of any new injuries or twisting makes acute causes less likely. - Plan: Knee injection consent form signed. Risk factors of bleeding and infection discussed with patient and patient is agreeable towards injection. Patient prepped with Betadine. Lateral approach towards injection used. Injected 80 mg of Depo-Medrol  and 1 mL of 2% lidocaine . Patient tolerated  procedure well and no side effects from noted. Minimal to no bleeding. Simple bandage applied after.   RTC if symptoms fail to improve or worsen.     Meds ordered this encounter  Medications   methylPREDNISolone  acetate (DEPO-MEDROL ) injection 80 mg    Return if symptoms worsen or fail to improve.  Levert Ready, Medical Student  Patient seen and examined with medical student, agree with assessment and plan above. Jolyne Needs, MD [facility] 04/27/2024, 10:42 AM

## 2024-05-15 ENCOUNTER — Other Ambulatory Visit (HOSPITAL_COMMUNITY): Payer: Self-pay

## 2024-05-20 ENCOUNTER — Other Ambulatory Visit (HOSPITAL_COMMUNITY): Payer: Self-pay

## 2024-05-22 ENCOUNTER — Other Ambulatory Visit: Payer: Self-pay

## 2024-05-29 ENCOUNTER — Inpatient Hospital Stay: Payer: Self-pay | Attending: Hematology

## 2024-05-29 ENCOUNTER — Other Ambulatory Visit: Payer: Self-pay

## 2024-05-29 DIAGNOSIS — Z79899 Other long term (current) drug therapy: Secondary | ICD-10-CM | POA: Diagnosis not present

## 2024-05-29 DIAGNOSIS — Z800 Family history of malignant neoplasm of digestive organs: Secondary | ICD-10-CM | POA: Insufficient documentation

## 2024-05-29 DIAGNOSIS — D751 Secondary polycythemia: Secondary | ICD-10-CM | POA: Insufficient documentation

## 2024-05-29 DIAGNOSIS — D696 Thrombocytopenia, unspecified: Secondary | ICD-10-CM | POA: Insufficient documentation

## 2024-05-29 DIAGNOSIS — F17210 Nicotine dependence, cigarettes, uncomplicated: Secondary | ICD-10-CM | POA: Diagnosis not present

## 2024-05-29 LAB — CBC WITH DIFFERENTIAL/PLATELET
Abs Immature Granulocytes: 0.07 K/uL (ref 0.00–0.07)
Basophils Absolute: 0.1 K/uL (ref 0.0–0.1)
Basophils Relative: 1 %
Eosinophils Absolute: 0.1 K/uL (ref 0.0–0.5)
Eosinophils Relative: 2 %
HCT: 57.4 % — ABNORMAL HIGH (ref 39.0–52.0)
Hemoglobin: 19.3 g/dL — ABNORMAL HIGH (ref 13.0–17.0)
Immature Granulocytes: 1 %
Lymphocytes Relative: 27 %
Lymphs Abs: 2.4 K/uL (ref 0.7–4.0)
MCH: 30.2 pg (ref 26.0–34.0)
MCHC: 33.6 g/dL (ref 30.0–36.0)
MCV: 89.8 fL (ref 80.0–100.0)
Monocytes Absolute: 0.7 K/uL (ref 0.1–1.0)
Monocytes Relative: 7 %
Neutro Abs: 5.5 K/uL (ref 1.7–7.7)
Neutrophils Relative %: 62 %
Platelets: 137 K/uL — ABNORMAL LOW (ref 150–400)
RBC: 6.39 MIL/uL — ABNORMAL HIGH (ref 4.22–5.81)
RDW: 14.2 % (ref 11.5–15.5)
WBC: 8.9 K/uL (ref 4.0–10.5)
nRBC: 0 % (ref 0.0–0.2)

## 2024-06-05 ENCOUNTER — Ambulatory Visit: Payer: Self-pay | Admitting: Physician Assistant

## 2024-06-06 NOTE — Progress Notes (Unsigned)
 [facility] [address], [place] [address]   CLINIC:  Medical Oncology/Hematology  PCP:  Dettinger, Fonda LABOR, MD [address] [place] [address] [phone]   REASON FOR VISIT:  Follow-up for erythrocytosis (secondary polycythemia) + NEW thrombocytopenia  INTERVAL HISTORY:   [name] 63 y.o. male is seen today for evaluation of thrombocytopenia and for follow-up of his previously known erythrocytosis (secondary polycythemia).  He was last seen by Pleasant Barefoot PA-C on 12/10/2022.  At that time, patient had requested discharge to PCP.  He was referred back to us  in June 2025 by PCP for evaluation of thrombocytopenia.  At today's visit, he reports feeling fairly well.   He has 70% energy and 85% appetite. He endorses that he is maintaining a stable weight.  THROMBOCYTOPENIA: No bleeding such as hematochezia, melena, hematuria, gum bleeding, or nosebleeds. No abnormal bruising or petechial rash. No B symptoms. No masses or lymphadenopathy.  ERYTHROCYTOSIS: No aquagenic pruritus/vasomotor symptoms/prior history of thrombosis. He continues to smoke 1 PPD cigarettes. Upon patient's request to discharge in clinic in February 2024, he was recommended to take aspirin  81 mg daily and to complete voluntary blood donation every few months to keep hematocrit <54.0%.  However, patient reports that he is not taking aspirin  and has not had any recent blood donations.  ASSESSMENT & PLAN:  1.  Thrombocytopenia - Mild intermittent thrombocytopenia since at least 2018, with persistent mild thrombocytopenia since May 2024.  Lowest platelets were at 96 on 12/23/2023. - Negative for hepatitis C and HIV in 2017.  Denies any high risk behaviors such as multiple sexual partners or IV drug use. - No known history of liver disease, autoimmune, or connective tissue disease. - Reports history of heavy alcohol consumption 20+ years ago, but denies any current alcohol intake. -  No new medications, tonic water , excessive limits, or herbal/quinine containing supplements - He takes multivitamin and B complex vitamins daily - Denies any abnormal bleeding, bruising, petechial rash, or B symptoms. - Most recent CBC/D (05/29/2024): Platelets 137.  Normal WBC/differential. - PLAN: Labs today to include B12, MMA, folate, copper , RF, ANA, anti-CCP, immature platelet fraction, LDH, SPEP, immunofixation, light chains - Abdominal ultrasound to evaluate for occult liver disease or splenomegaly - RTC 1 to 2 months (next available appointment) to discuss results and next steps  2.  Erythrocytosis - Intermittent erythrocytosis since at least 2015 - No history of DVT, PE, or MI. - He is a current every day smoker (1 PPD since age 70, previously quit for about 12 years before starting again) - No aquagenic pruritus/vasomotor symptoms/prior history of thrombosis. - No history of sleep apnea.  He does not take testosterone supplements or diuretics. - Hematology workup (08/07/2022): JAK2, CALR, MPL, Exon 12-15 were NEGATIVE Normal erythropoietin  16.2.  Normal LDH. - DIFFERENTIAL DIAGNOSIS favors secondary polycythemia/erythrocytosis secondary to tobacco use.   - Discussed with patient that main treatment of secondary polycythemia is focused at underlying cause, in this case smoking.  We discussed the importance of smoking cessation. - We have discussed that the main risk of secondary polycythemia is thrombosis including DVT, PE, MI, or CVA.  Patient has additional cardiac risk factors including hypertension, hyperlipidemia, diabetes, and obesity. - Phlebotomy can be used in the setting of secondary polycythemia for relief of severe symptoms (strokelike symptoms, severe recurrent headaches, severe fatigue) or if hematocrit is > 54 - Most recent CBC/D (05/29/2024): Hgb 19.3/hematocrit 57.4%.   - PLAN: Recommendations for patient as  follows:  Recommend smoking cessation.  This was discussed  extensively during patient visit and he was provided with materials and resources. Recommend aspirin  81 mg daily to reduce the risk of thrombotic event in the setting of secondary polycythemia and other cardiac risk factors.   Recommend voluntary blood donation every 2-3 months to keep hematocrit <54.0%.  If unable to donate at blood donation center, we can perform therapeutic phlebotomy here in clinic.  3.  Tobacco use - Patient is current everyday smoker, 1 PPD since age 77, but had previously quit for about 12 years before starting again - Patient is agreeable to annual LDCT scan for as long as he qualifies for lung cancer screening - LDCT chest (08/25/2022): Lung RADS 2, benign appearance/behavior - PLAN: Patient is due for annual LDCT chest for LCS, but would like to hold off on that for now since he had to pay for the majority of the cost of his last LDCT chest.    4.  Social/family history: - Lives at home with his wife.  There is independent of ADLs and IADLs. 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 - Current active smoker.  Smokes 1 pack/day starting at age 38, quit for 12 years. - Brother had colon cancer.  Another brother had liver cancer and was a drinker.   PLAN SUMMARY: >> Labs today = B12, MMA, folate, copper , RF, ANA, anti-CCP, immature platelet fraction, LDH, SPEP, immunofixation, light chains >> Abdominal ultrasound  >> RTC 1 to 2 months (next available appointment) for OFFICE visit + same-day CBC/D     REVIEW OF SYSTEMS:   Review of Systems  Constitutional:  Negative for appetite change, chills, diaphoresis, fatigue, fever and unexpected weight change.  HENT:   Negative for lump/mass and nosebleeds.   Eyes:  Negative for eye problems.  Respiratory:  Positive for cough and shortness of breath. Negative for hemoptysis.   Cardiovascular:  Negative for chest pain, leg swelling and palpitations.  Gastrointestinal:  Negative for abdominal pain, blood in  stool, constipation, diarrhea, nausea and vomiting.  Genitourinary:  Negative for hematuria.   Skin: Negative.   Neurological:  Negative for dizziness, headaches and light-headedness.  Hematological:  Does not bruise/bleed easily.  Psychiatric/Behavioral:  The patient is nervous/anxious.      PHYSICAL EXAM:  ECOG PERFORMANCE STATUS: 1 - Symptomatic but completely ambulatory  Vitals:   06/07/24 0814  BP: (!) 154/91  Pulse: 65  Resp: 19  Temp: 97.7 F (36.5 C)  SpO2: 94%   Filed Weights   06/07/24 0814  Weight: 227 lb 3.2 oz (103.1 kg)   Physical Exam Constitutional:      Appearance: Normal appearance. He is obese.  Cardiovascular:     Heart sounds: Normal heart sounds.  Pulmonary:     Breath sounds: Wheezing present.  Neurological:     General: No focal deficit present.     Mental Status: Mental status is at baseline.  Psychiatric:        Behavior: Behavior normal. Behavior is cooperative.     PAST MEDICAL/SURGICAL HISTORY:  Past Medical History:  Diagnosis Date   Anxiety    Arthritis    all over my body I think (09/23/2017)   Chronic kidney disease    Colon polyp    Diverticulitis    GERD (gastroesophageal reflux disease)    hx   Hyperlipidemia    Hypertension    Large bowel obstruction (HCC) 01/31/2021   Pre-diabetes    Past Surgical  History:  Procedure Laterality Date   ARTHROSCOPY KNEE W/ DRILLING Left 10/25/2020   COLONOSCOPY  X ~ 2   brother passed away w/colon cancer; I've probably had 4 colonoscopies done since he passed (09/23/2017)   COLONOSCOPY W/ BIOPSIES AND POLYPECTOMY  X 2   COLONOSCOPY WITH PROPOFOL  N/A 04/29/2021   Procedure: COLONOSCOPY WITH PROPOFOL ;  Surgeon: Eartha Angelia Sieving, MD;  Location: [facility];  Service: Gastroenterology;  Laterality: N/A;  8:45   COLOSTOMY N/A 01/31/2021   Procedure: COLOSTOMY;  Surgeon: Kallie Manuelita BROCKS, MD;  Location: [facility];  Service: General;  Laterality: N/A;   COLOSTOMY REVERSAL N/A  02/02/2022   Procedure: COLOSTOMY REVERSAL with small bowel resection;  Surgeon: Kallie Manuelita BROCKS, MD;  Location: [facility];  Service: General;  Laterality: N/A;   CYST EXCISION  2004   Roof of mouth   HEMORRHOID SURGERY     lanced   LEFT HEART CATH AND CORONARY ANGIOGRAPHY N/A 09/24/2017   Procedure: LEFT HEART CATH AND CORONARY ANGIOGRAPHY;  Surgeon: Dann Candyce RAMAN, MD;  Location: [facility];  Service: Cardiovascular;  Laterality: N/A;   PARASTOMAL HERNIA REPAIR N/A 02/02/2022   Procedure: HERNIA REPAIR PARASTOMAL AND VENTRAL WITH PHASIX MESH;  Surgeon: Kallie Manuelita BROCKS, MD;  Location: [facility];  Service: General;  Laterality: N/A;   PARTIAL COLECTOMY N/A 01/31/2021   Procedure: PARTIAL COLECTOMY;  Surgeon: Kallie Manuelita BROCKS, MD;  Location: [facility];  Service: General;  Laterality: N/A;   POLYPECTOMY  04/29/2021   Procedure: POLYPECTOMY;  Surgeon: Eartha Angelia, Sieving, MD;  Location: [facility];  Service: Gastroenterology;;   SHOULDER ARTHROSCOPY WITH ROTATOR CUFF REPAIR Right     SOCIAL HISTORY:  Social History   Socioeconomic History   Marital status: Married    Spouse name: Not on file   Number of children: Not on file   Years of education: Not on file   Highest education level: Not on file  Occupational History   Not on file  Tobacco Use   Smoking status: Every Day    Current packs/day: 1.00    Average packs/day: 1 pack/day for 34.0 years (34.0 ttl pk-yrs)    Types: Cigarettes   Smokeless tobacco: Never  Vaping Use   Vaping status: Never Used  Substance and Sexual Activity   Alcohol use: No   Drug use: No   Sexual activity: Yes  Other Topics Concern   Not on file  Social History Narrative   Not on file   Social Drivers of Health   Financial Resource Strain: Low Risk  (03/03/2022)   Overall Financial Resource Strain (CARDIA)    Difficulty of Paying Living Expenses: Not hard at all  Food Insecurity: No Food Insecurity (03/03/2022)   Hunger Vital  Sign    Worried About Running Out of Food in the Last Year: Never true    Ran Out of Food in the Last Year: Never true  Transportation Needs: No Transportation Needs (03/03/2022)   PRAPARE - Administrator, Civil Service (Medical): No    Lack of Transportation (Non-Medical): No  Physical Activity: Not on file  Stress: Not on file  Social Connections: Not on file  Intimate Partner Violence: Not on file    FAMILY HISTORY:  Family History  Problem Relation Age of Onset   Colon cancer Brother    Dementia Mother    CVA Father    Sudden death Sister 6       in her  sleep, was told a heart attack   Heart disease Brother        CABG   Heart disease Brother        CABG   Heart disease Brother        CABG    CURRENT MEDICATIONS:  Outpatient Encounter Medications as of 06/07/2024  Medication Sig   ALPRAZolam  (XANAX ) 1 MG tablet Take 1 tablet (1 mg total) by mouth 3 (three) times daily as needed for anxiety.   carvedilol  (COREG ) 6.25 MG tablet Take 1 tablet (6.25 mg total) by mouth in the morning and in the evening with a meal   diclofenac  Sodium (VOLTAREN ) 1 % GEL Apply 2-4 g topically 4 (four) times daily.   lisinopril  (ZESTRIL ) 2.5 MG tablet Take 1 tablet (2.5 mg total) by mouth daily.   Multiple Vitamins-Minerals (MENS 50+ MULTI VITAMIN/MIN PO) Take 1 tablet by mouth daily.   Omega-3 Fatty Acids (FISH OIL) 1000 MG CAPS Take 1,000 mg by mouth daily. 4 daily  Ubiquinol   rosuvastatin  (CRESTOR ) 40 MG tablet Take 1 tablet (40 mg total) by mouth daily.   Semaglutide , 2 MG/DOSE, 8 MG/3ML SOPN Inject 2 mg as directed once a week.   tamsulosin  (FLOMAX ) 0.4 MG CAPS capsule Take 1 capsule (0.4 mg total) by mouth 2 (two) times daily.   No facility-administered encounter medications on file as of 06/07/2024.    ALLERGIES:  Allergies  Allergen Reactions   Bee Venom Anaphylaxis, Swelling and Other (See Comments)    Anaphylactic shock   Penicillins Anaphylaxis and Swelling     Airway closes up Has patient had a PCN reaction causing immediate rash, facial/tongue/throat swelling, SOB or lightheadedness with hypotension: Yes Has patient had a PCN reaction causing severe rash involving mucus membranes or skin necrosis: Yes Has patient had a PCN reaction that required hospitalization: Prentiss Has patient had a PCN reaction occurring within the last 10 years: No If all of the above answers are NO, then may proceed with Cephalosporin use.     LABORATORY DATA:  I have reviewed the labs as listed.  CBC    Component Value Date/Time   WBC 8.9 05/29/2024 1006   RBC 6.39 (H) 05/29/2024 1006   HGB 19.3 (H) 05/29/2024 1006   HGB 19.5 (H) 03/24/2024 0839   HCT 57.4 (H) 05/29/2024 1006   HCT 60.4 (H) 03/24/2024 0839   PLT 137 (L) 05/29/2024 1006   PLT 132 (L) 03/24/2024 0839   MCV 89.8 05/29/2024 1006   MCV 92 03/24/2024 0839   MCH 30.2 05/29/2024 1006   MCHC 33.6 05/29/2024 1006   RDW 14.2 05/29/2024 1006   RDW 14.2 03/24/2024 0839   LYMPHSABS 2.4 05/29/2024 1006   LYMPHSABS 1.8 03/24/2024 0839   MONOABS 0.7 05/29/2024 1006   EOSABS 0.1 05/29/2024 1006   EOSABS 0.1 03/24/2024 0839   BASOSABS 0.1 05/29/2024 1006   BASOSABS 0.1 03/24/2024 0839      Latest Ref Rng & Units 03/24/2024    8:39 AM 12/23/2023    8:10 AM 09/16/2023    1:13 PM  CMP  Glucose 70 - 99 mg/dL 835  810  833   BUN 8 - 27 mg/dL 13  15  16    Creatinine 0.76 - 1.27 mg/dL 8.95  8.60  8.67   Sodium 134 - 144 mmol/L 141  140  139   Potassium 3.5 - 5.2 mmol/L 4.3  4.2  4.8   Chloride 96 - 106 mmol/L 103  102  101   CO2 20 - 29 mmol/L 17  23  23    Calcium  8.6 - 10.2 mg/dL 9.7  8.6  9.8   Total Protein 6.0 - 8.5 g/dL 6.7  6.4  7.0   Total Bilirubin 0.0 - 1.2 mg/dL 1.2  1.4  1.8   Alkaline Phos 44 - 121 IU/L 70  82  80   AST 0 - 40 IU/L 22  24  34   ALT 0 - 44 IU/L 24  24  34     DIAGNOSTIC IMAGING:  I have independently reviewed the relevant imaging and discussed with the patient.   WRAP UP:   All questions were answered. The patient knows to call the clinic with any problems, questions or concerns.  Medical decision making: Moderate  Time spent on visit: I spent 25 minutes counseling the patient face to face. The total time spent in the appointment was 40 minutes and more than 50% was on counseling.  Pleasant CHRISTELLA Barefoot, PA-C  06/07/24 8:50 AM

## 2024-06-07 ENCOUNTER — Inpatient Hospital Stay

## 2024-06-07 ENCOUNTER — Inpatient Hospital Stay (HOSPITAL_BASED_OUTPATIENT_CLINIC_OR_DEPARTMENT_OTHER): Payer: Self-pay | Admitting: Physician Assistant

## 2024-06-07 VITALS — BP 154/91 | HR 65 | Temp 97.7°F | Resp 19 | Wt 227.2 lb

## 2024-06-07 DIAGNOSIS — D751 Secondary polycythemia: Secondary | ICD-10-CM

## 2024-06-07 DIAGNOSIS — D696 Thrombocytopenia, unspecified: Secondary | ICD-10-CM | POA: Diagnosis not present

## 2024-06-07 LAB — IMMATURE PLATELET FRACTION: Immature Platelet Fraction: 4.3 % (ref 1.2–8.6)

## 2024-06-07 LAB — LACTATE DEHYDROGENASE: LDH: 123 U/L (ref 98–192)

## 2024-06-07 LAB — VITAMIN B12: Vitamin B-12: 422 pg/mL (ref 180–914)

## 2024-06-07 LAB — FOLATE: Folate: >40 ng/mL (ref >5.9)

## 2024-06-07 NOTE — Patient Instructions (Addendum)
 [facility] **VISIT SUMMARY & IMPORTANT INSTRUCTIONS **   You were seen today by Pleasant Barefoot PA-C for your low platelets and your elevated hemoglobin (erythrocytosis).    LOW PLATELETS We do not yet know the cause of your low platelets. We will check blood test today to help determine why your platelets are low. We will also check an abdominal ultrasound to see if you have any problems with your liver or spleen that could cause low platelets.  ERYTHROCYTOSIS Your elevated hemoglobin is due to your tobacco use. The best treatment for elevated hemoglobin is to stop smoking. Until that time, I would like you to donate blood once every 2 months to try to keep your hematocrit <54%. Your elevated hemoglobin places you at increased risk of blood clots, heart attack, and stroke. You should start taking aspirin  81 mg daily due to your high red blood cells in the setting of other cardiac risk factors such as hypertension, hyperlipidemia, obesity, and diabetes.  FOLLOW-UP APPOINTMENT: 1-2 months  ** Thank you for trusting me with your healthcare!  I strive to provide all of my patients with quality care at each visit.  If you receive a survey for this visit, I would be so grateful to you for taking the time to provide feedback.  Thank you in advance!  ~ Gerik Coberly                   Dr. Alean Stands   &   Pleasant Barefoot, PA-C   - - - - - - - - - - - - - - - - - -    Thank you for choosing  [facility] to provide your oncology and hematology care.  To afford each patient quality time with our provider, please arrive at least 15 minutes before your scheduled appointment time.   If you have a lab appointment with the Cancer Center please come in thru the Main Entrance and check in at the main information desk.  You need to re-schedule your appointment should you arrive 10 or more minutes late.  We strive to give  you quality time with our providers, and arriving late affects you and other patients whose appointments are after yours.  Also, if you no show three or more times for appointments you may be dismissed from the clinic at the providers discretion.     Again, thank you for choosing [facility].  Our hope is that these requests will decrease the amount of time that you wait before being seen by our physicians.       _____________________________________________________________  Should you have questions after your visit to [facility], please contact our office at [phone] and follow the prompts.  Our office hours are 8:00 a.m. and 4:30 p.m. Monday - Friday.  Please note that voicemails left after 4:00 p.m. may not be returned until the following business day.  We are closed weekends and major holidays.  You do have access to a nurse 24-7, just call the main number to the clinic [phone] and do not press any options, hold on the line and a nurse will answer the phone.    For prescription refill requests, have your pharmacy contact our office and allow 72 hours.

## 2024-06-08 LAB — KAPPA/LAMBDA LIGHT CHAINS
Kappa free light chain: 27.7 mg/L — ABNORMAL HIGH (ref 3.3–19.4)
Kappa, lambda light chain ratio: 1.71 — ABNORMAL HIGH (ref 0.26–1.65)
Lambda free light chains: 16.2 mg/L (ref 5.7–26.3)

## 2024-06-08 LAB — ANA: Anti Nuclear Antibody (ANA): NEGATIVE

## 2024-06-08 LAB — COPPER, SERUM: Copper: 87 ug/dL (ref 69–132)

## 2024-06-09 LAB — RHEUMATOID FACTOR: Rheumatoid fact SerPl-aCnc: 10.1 [IU]/mL (ref <14.0)

## 2024-06-09 LAB — CYCLIC CITRUL PEPTIDE ANTIBODY, IGG/IGA: CCP Antibodies IgG/IgA: 7 U (ref 0–19)

## 2024-06-10 LAB — METHYLMALONIC ACID, SERUM: Methylmalonic Acid, Quantitative: 156 nmol/L (ref 0–378)

## 2024-06-12 ENCOUNTER — Ambulatory Visit (HOSPITAL_COMMUNITY)
Admission: RE | Admit: 2024-06-12 | Discharge: 2024-06-12 | Disposition: A | Discharge status: Home / Self Care | Source: Ambulatory Visit | Attending: Physician Assistant | Admitting: Physician Assistant

## 2024-06-12 DIAGNOSIS — R161 Splenomegaly, not elsewhere classified: Secondary | ICD-10-CM | POA: Diagnosis not present

## 2024-06-12 DIAGNOSIS — D696 Thrombocytopenia, unspecified: Secondary | ICD-10-CM | POA: Diagnosis not present

## 2024-06-12 DIAGNOSIS — K7689 Other specified diseases of liver: Secondary | ICD-10-CM | POA: Diagnosis not present

## 2024-06-12 LAB — PROTEIN ELECTROPHORESIS, SERUM
A/G Ratio: 1.3 (ref 0.7–1.7)
Albumin ELP: 3.9 g/dL (ref 2.9–4.4)
Alpha-1-Globulin: 0.2 g/dL (ref 0.0–0.4)
Alpha-2-Globulin: 0.8 g/dL (ref 0.4–1.0)
Beta Globulin: 1.1 g/dL (ref 0.7–1.3)
Gamma Globulin: 0.9 g/dL (ref 0.4–1.8)
Globulin, Total: 3.1 g/dL (ref 2.2–3.9)
Total Protein ELP: 7 g/dL (ref 6.0–8.5)

## 2024-06-13 LAB — IMMUNOFIXATION ELECTROPHORESIS
IgA: 412 mg/dL (ref 61–437)
IgG (Immunoglobin G), Serum: 996 mg/dL (ref 603–1613)
IgM (Immunoglobulin M), Srm: 104 mg/dL (ref 20–172)
Total Protein ELP: 6.8 g/dL (ref 6.0–8.5)

## 2024-06-14 ENCOUNTER — Other Ambulatory Visit (HOSPITAL_COMMUNITY): Payer: Self-pay

## 2024-06-23 ENCOUNTER — Other Ambulatory Visit (HOSPITAL_COMMUNITY): Payer: Self-pay

## 2024-06-28 ENCOUNTER — Other Ambulatory Visit (HOSPITAL_COMMUNITY): Payer: Self-pay

## 2024-06-28 ENCOUNTER — Ambulatory Visit: Admitting: Family Medicine

## 2024-06-28 ENCOUNTER — Encounter: Payer: Self-pay | Admitting: Family Medicine

## 2024-06-28 VITALS — BP 114/74 | HR 68 | Temp 98.3°F | Ht 70.0 in | Wt 226.0 lb

## 2024-06-28 DIAGNOSIS — N183 Chronic kidney disease, stage 3 unspecified: Secondary | ICD-10-CM | POA: Diagnosis not present

## 2024-06-28 DIAGNOSIS — R809 Proteinuria, unspecified: Secondary | ICD-10-CM | POA: Diagnosis not present

## 2024-06-28 DIAGNOSIS — Z7985 Long-term (current) use of injectable non-insulin antidiabetic drugs: Secondary | ICD-10-CM | POA: Diagnosis not present

## 2024-06-28 DIAGNOSIS — E1122 Type 2 diabetes mellitus with diabetic chronic kidney disease: Secondary | ICD-10-CM | POA: Diagnosis not present

## 2024-06-28 DIAGNOSIS — E782 Mixed hyperlipidemia: Secondary | ICD-10-CM | POA: Diagnosis not present

## 2024-06-28 DIAGNOSIS — I10 Essential (primary) hypertension: Secondary | ICD-10-CM

## 2024-06-28 DIAGNOSIS — F411 Generalized anxiety disorder: Secondary | ICD-10-CM | POA: Diagnosis not present

## 2024-06-28 DIAGNOSIS — E1129 Type 2 diabetes mellitus with other diabetic kidney complication: Secondary | ICD-10-CM | POA: Diagnosis not present

## 2024-06-28 LAB — CMP14+EGFR
ALT: 21 IU/L (ref 0–44)
AST: 22 IU/L (ref 0–40)
Albumin: 4.2 g/dL (ref 3.9–4.9)
Alkaline Phosphatase: 71 IU/L (ref 44–121)
BUN/Creatinine Ratio: 15 (ref 10–24)
BUN: 17 mg/dL (ref 8–27)
Bilirubin Total: 1.2 mg/dL (ref 0.0–1.2)
CO2: 21 mmol/L (ref 20–29)
Calcium: 9.1 mg/dL (ref 8.6–10.2)
Chloride: 102 mmol/L (ref 96–106)
Creatinine, Ser: 1.16 mg/dL (ref 0.76–1.27)
Globulin, Total: 2.6 g/dL (ref 1.5–4.5)
Glucose: 148 mg/dL — ABNORMAL HIGH (ref 70–99)
Potassium: 4.6 mmol/L (ref 3.5–5.2)
Sodium: 138 mmol/L (ref 134–144)
Total Protein: 6.8 g/dL (ref 6.0–8.5)
eGFR: 70 mL/min/1.73 (ref >59)

## 2024-06-28 LAB — LIPID PANEL
Chol/HDL Ratio: 3.5 ratio (ref 0.0–5.0)
Cholesterol, Total: 85 mg/dL — ABNORMAL LOW (ref 100–199)
HDL: 24 mg/dL — ABNORMAL LOW (ref >39)
LDL Chol Calc (NIH): 22 mg/dL (ref 0–99)
Triglycerides: 262 mg/dL — ABNORMAL HIGH (ref 0–149)
VLDL Cholesterol Cal: 39 mg/dL (ref 5–40)

## 2024-06-28 LAB — CBC WITH DIFFERENTIAL/PLATELET
Basophils Absolute: 0.1 x10E3/uL (ref 0.0–0.2)
Basos: 1 %
EOS (ABSOLUTE): 0.1 x10E3/uL (ref 0.0–0.4)
Eos: 1 %
Hematocrit: 56.4 % — ABNORMAL HIGH (ref 37.5–51.0)
Hemoglobin: 19 g/dL — ABNORMAL HIGH (ref 13.0–17.7)
Immature Grans (Abs): 0 x10E3/uL (ref 0.0–0.1)
Immature Granulocytes: 0 %
Lymphocytes Absolute: 1.8 x10E3/uL (ref 0.7–3.1)
Lymphs: 25 %
MCH: 30.3 pg (ref 26.6–33.0)
MCHC: 33.7 g/dL (ref 31.5–35.7)
MCV: 90 fL (ref 79–97)
Monocytes Absolute: 0.5 x10E3/uL (ref 0.1–0.9)
Monocytes: 7 %
Neutrophils Absolute: 4.5 x10E3/uL (ref 1.4–7.0)
Neutrophils: 65 %
Platelets: 147 x10E3/uL — ABNORMAL LOW (ref 150–450)
RBC: 6.27 x10E6/uL — ABNORMAL HIGH (ref 4.14–5.80)
RDW: 13.9 % (ref 11.6–15.4)
WBC: 7 x10E3/uL (ref 3.4–10.8)

## 2024-06-28 LAB — BAYER DCA HB A1C WAIVED: HB A1C (BAYER DCA - WAIVED): 6.4 % — ABNORMAL HIGH (ref 4.8–5.6)

## 2024-06-28 MED ORDER — LISINOPRIL 2.5 MG PO TABS
2.5000 mg | ORAL_TABLET | Freq: Every day | ORAL | 3 refills | Status: AC
  Filled 2024-06-28: qty 90, 90d supply, fill #0
  Filled 2024-09-26: qty 90, 90d supply, fill #1
  Filled 2024-10-06 – 2024-10-09 (×2): qty 90, 90d supply, fill #2

## 2024-06-28 MED ORDER — ALPRAZOLAM 1 MG PO TABS: 1.0000 mg | ORAL_TABLET | Freq: Three times a day (TID) | ORAL | 2 refills | Status: DC | PRN

## 2024-06-28 NOTE — Progress Notes (Signed)
 BP 114/74   Pulse 68   Temp 98.3 F (36.8 C)   Ht 5' 10 (1.778 m)   Wt 226 lb (102.5 kg)   SpO2 94%   BMI 32.43 kg/m    Subjective:   Patient ID: Francisco Allen, male    DOB: 27-Aug-1961, 63 y.o.   MRN: [id number]  HPI: Francisco Allen is a 63 y.o. male presenting on 06/28/2024 for Medical Management of Chronic Issues   Discussed the use of AI scribe software for clinical note transcription with the patient, who gave verbal consent to proceed.  History of Present Illness   Francisco Allen is a 63 year old male with type 2 diabetes, hypertension, and CKD stage 3 who presents for a recheck.  He is here for a recheck of his type 2 diabetes, which is associated with microalbuminuria and CKD stage 3. He is currently on Ozempic  and experiences occasional nausea after taking the medication, though he is unsure if it is related. He does not regularly check his blood sugar at home and recalls his last blood sugar check was during a visit with a hematologist, but he does not remember the results.  For his hypertension, he continues to take lisinopril  and carvedilol . He also takes a cholesterol pill and fish oil for hyperlipidemia.  Regarding his general anxiety disorder, he takes alprazolam  1 mg TID PRN and receives 90 pills per month. He finds it helpful, although he experiences stress related to family issues and dental problems. He manages overwhelming feelings by smoking and taking his medication.  He has been using Voltaren  gel for knee pain but has switched to a generic patch with lanacane, which he finds more effective. He uses the patch for 8 to 12 hours to relieve stiffness.  He is considering dental work, specifically implants versus dentures, due to issues with his top teeth, including broken teeth at the gum line.  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  He experiences occasional nausea, particularly after taking Ozempic .          Relevant  past medical, surgical, family and social history reviewed and updated as indicated. Interim medical history since our last visit reviewed. Allergies and medications reviewed and updated.  Review of Systems  Constitutional:  Negative for chills and fever.  HENT:  Positive for dental problem.   Eyes:  Negative for discharge.  Respiratory:  Negative for shortness of breath and wheezing.   Cardiovascular:  Negative for chest pain and leg swelling.  Musculoskeletal:  Positive for arthralgias. Negative for back pain and gait problem.  Skin:  Negative for rash.  Psychiatric/Behavioral:  The patient is nervous/anxious.   All other systems reviewed and are negative.   Per HPI unless specifically indicated above   Allergies as of 06/28/2024       Reactions   Bee Venom Anaphylaxis, Swelling, Other (See Comments)   Anaphylactic shock   Penicillins Anaphylaxis, Swelling   Airway closes up Has patient had a PCN reaction causing immediate rash, facial/tongue/throat swelling, SOB or lightheadedness with hypotension: Yes Has patient had a PCN reaction causing severe rash involving mucus membranes or skin necrosis: Yes Has patient had a PCN reaction that required hospitalization: Prentiss Has patient had a PCN reaction occurring within the last 10 years: No If all of the above answers are NO, then may proceed with Cephalosporin use.        Medication List  Accurate as of June 28, 2024  8:26 AM. If you have any questions, ask your nurse or doctor.          ALPRAZolam  1 MG tablet Commonly known as: XANAX  Take 1 tablet (1 mg total) by mouth 3 (three) times daily as needed for anxiety.   aspirin  EC 81 MG tablet Take 81 mg by mouth daily. Swallow whole.   b complex vitamins capsule Take 1 capsule by mouth daily.   carvedilol  6.25 MG tablet Commonly known as: COREG  Take 1 tablet (6.25 mg total) by mouth in the morning and in the evening with a meal   diclofenac  Sodium 1 %  Gel Commonly known as: Voltaren  Apply 2-4 g topically 4 (four) times daily.   Fish Oil 1000 MG Caps Take 1,000 mg by mouth daily. 4 daily  Ubiquinol   lisinopril  2.5 MG tablet Commonly known as: ZESTRIL  Take 1 tablet (2.5 mg total) by mouth daily.   MENS 50+ MULTI VITAMIN/MIN PO Take 1 tablet by mouth daily.   Ozempic  (2 MG/DOSE) 8 MG/3ML Sopn Generic drug: Semaglutide  (2 MG/DOSE) Inject 2 mg as directed once a week.   rosuvastatin  40 MG tablet Commonly known as: CRESTOR  Take 1 tablet (40 mg total) by mouth daily.   tamsulosin  0.4 MG Caps capsule Commonly known as: FLOMAX  Take 1 capsule (0.4 mg total) by mouth 2 (two) times daily.         Objective:   BP 114/74   Pulse 68   Temp 98.3 F (36.8 C)   Ht 5' 10 (1.778 m)   Wt 226 lb (102.5 kg)   SpO2 94%   BMI 32.43 kg/m   Wt Readings from Last 3 Encounters:  06/28/24 226 lb (102.5 kg)  06/07/24 227 lb 3.2 oz (103.1 kg)  04/27/24 233 lb (105.7 kg)    Physical Exam Physical Exam   VITALS: BP- 114/79 NECK: Thyroid  non-tender, no thyromegaly. CHEST: Lungs clear to auscultation bilaterally. CARDIOVASCULAR: Regular heart rate and rhythm, no murmurs.         Assessment & Plan:   Problem List Items Addressed This Visit       Cardiovascular and Mediastinum   HTN (hypertension)   Relevant Medications   aspirin  EC 81 MG tablet   lisinopril  (ZESTRIL ) 2.5 MG tablet   Other Relevant Orders   CBC with Differential/Platelet   CMP14+EGFR     Endocrine   Type 2 diabetes mellitus with microalbuminuria (HCC) - Primary   Relevant Medications   aspirin  EC 81 MG tablet   lisinopril  (ZESTRIL ) 2.5 MG tablet   Other Relevant Orders   CMP14+EGFR   Bayer DCA Hb A1c Waived   CKD stage 3 due to type 2 diabetes mellitus (HCC)   Relevant Medications   aspirin  EC 81 MG tablet   lisinopril  (ZESTRIL ) 2.5 MG tablet   Other Relevant Orders   CBC with Differential/Platelet   CMP14+EGFR     Other   GAD (generalized  anxiety disorder)   Relevant Medications   ALPRAZolam  (XANAX ) 1 MG tablet   Hyperlipidemia   Relevant Medications   aspirin  EC 81 MG tablet   lisinopril  (ZESTRIL ) 2.5 MG tablet   Other Relevant Orders   Lipid panel        Type 2 diabetes mellitus with microalbuminuria and CKD stage 3 Blood sugar levels not regularly monitored. On Ozempic  with occasional nausea. - Check blood sugar level today. - Order A1c test. - Continue Ozempic . - Follow up in 3 months.  Essential hypertension Blood pressure well-controlled with current medication. Recent reading 114/79 mmHg. - Continue lisinopril . - Continue carvedilol .  Hyperlipidemia - Continue cholesterol medication. - Continue fish oil.  Generalized anxiety disorder Managed with alprazolam . Effective but situational anxiety persists. - Refill alprazolam  prescription.  Chronic knee pain Voltaren  gel ineffective. Salonpas patches with lanacane more effective. - Continue using Salonpas patches with lanacane.          Follow up plan: Return in about 3 months (around 09/28/2024), or if symptoms worsen or fail to improve, for Type 2 diabetes and CKD and hyperlipidemia.  Counseling provided for all of the vaccine components Orders Placed This Encounter  Procedures   Lipid panel   CBC with Differential/Platelet   CMP14+EGFR   Bayer DCA Hb A1c Waived    Fonda Levins, MD [facility] 06/28/2024, 8:26 AM

## 2024-07-06 ENCOUNTER — Ambulatory Visit: Payer: Self-pay | Admitting: Family Medicine

## 2024-07-12 ENCOUNTER — Other Ambulatory Visit (HOSPITAL_COMMUNITY): Payer: Self-pay

## 2024-07-20 ENCOUNTER — Other Ambulatory Visit (HOSPITAL_COMMUNITY): Payer: Self-pay

## 2024-08-08 ENCOUNTER — Other Ambulatory Visit (HOSPITAL_COMMUNITY): Payer: Self-pay

## 2024-08-09 ENCOUNTER — Other Ambulatory Visit (HOSPITAL_COMMUNITY): Payer: Self-pay

## 2024-08-12 NOTE — Progress Notes (Unsigned)
 [facility] [address] [place], [place] [address]   CLINIC:  Medical Oncology/Hematology  PCP:  Dettinger, Fonda LABOR, MD [address] [place] [address] [phone]   REASON FOR VISIT:  Follow-up for erythrocytosis (secondary polycythemia) + thrombocytopenia  INTERVAL HISTORY:   Francisco Allen 63 y.o. male is seen today for evaluation of thrombocytopenia and for follow-up of his previously known erythrocytosis (secondary polycythemia).   He was last seen by Pleasant Barefoot PA-C on 06/07/2024 for evaluation of new thrombocytopenia. He returns today to discuss results of his thrombocytopenia workup. He denies any major changes in his health since his last visit 2 months ago.  At today's visit, he reports feeling fairly well. He has 100% energy and 100% appetite. He endorses that he is maintaining a stable weight.  THROMBOCYTOPENIA: No bleeding such as hematochezia, melena, hematuria, gum bleeding, or nosebleeds. No abnormal bruising or petechial rash. No B symptoms. No masses or lymphadenopathy.  ERYTHROCYTOSIS: No aquagenic pruritus/vasomotor symptoms/prior history of thrombosis. He continues to smoke 1 PPD cigarettes. He is taking aspirin  81 mg daily. He has not had the time to go for phlebotomy yet (recommended voluntary blood donation every few months to keep hematocrit <54.0%).  ASSESSMENT & PLAN:  1.  Thrombocytopenia - Mild intermittent thrombocytopenia since at least 2018, with persistent mild thrombocytopenia since May 2024.  Lowest platelets were at 96 on 12/23/2023. - Negative for hepatitis C and HIV in 2017.  Denies any high risk behaviors such as multiple sexual partners or IV drug use. - No known history of liver disease, autoimmune, or connective tissue disease. - Reports history of heavy alcohol consumption 20+ years ago, but denies any current alcohol intake. - No new medications, tonic water , excessive limits, or herbal/quinine containing  supplements - He takes multivitamin and B complex vitamins daily - Denies any abnormal bleeding, bruising, petechial rash, or B symptoms. - Hematology workup (06/07/2024): Normal B12 (422), normal MMA, normal folate, normal copper . ANA, rheumatoid factor, anti-CCP negative.   Negative SPEP and immunofixation.  Minimally elevated kappa free light chain 27.7 with FLC ratio 1.71. Normal immature platelet fraction 4.3%.  Normal LDH. - Abdominal US  (06/12/2024): Findings consistent with hepatic steatosis.  Splenomegaly (13.4 cm, 723 cc). - Most recent CBC/D (08/14/2024): Platelets 112.  Normal WBC/differential. - PLAN: Differential diagnosis favors thrombocytopenia secondary to fatty liver disease and splenomegaly.   - Recommend referral to GI for additional workup and treatment of fatty liver disease with splenomegaly.   - RTC 1 year   2.  Erythrocytosis - Intermittent erythrocytosis since at least 2015 - No history of DVT, PE, or MI. - He is a current every day smoker (1 PPD since age 55, previously quit for about 12 years before starting again) - No aquagenic pruritus/vasomotor symptoms/prior history of thrombosis. - No history of sleep apnea.  He does not take testosterone supplements or diuretics. - Hematology workup (08/07/2022): JAK2, CALR, MPL, Exon 12-15 were NEGATIVE Normal erythropoietin  16.2.  Normal LDH. - DIFFERENTIAL DIAGNOSIS favors secondary polycythemia/erythrocytosis secondary to tobacco use.   - Discussed with patient that main treatment of secondary polycythemia is focused at underlying cause, in this case smoking.  We discussed the importance of smoking cessation. - We have discussed that the main risk of secondary polycythemia is thrombosis including DVT, PE, MI, or CVA.  Patient has additional cardiac risk factors including hypertension, hyperlipidemia, diabetes, and obesity. - Phlebotomy can be used in the setting of secondary polycythemia for relief of  severe symptoms  (strokelike symptoms, severe recurrent headaches, severe fatigue) or if hematocrit is > 54 - Most recent CBC/D (08/14/2024): Hgb 19.0/hematocrit 56.4%.   - PLAN: Recommendations for patient as follows:  Recommend smoking cessation.  This was discussed extensively during patient visit and he was provided with materials and resources. Recommend aspirin  81 mg daily to reduce the risk of thrombotic event in the setting of secondary polycythemia and other cardiac risk factors. Recommend voluntary blood donation every 2-3 months to keep hematocrit <54.0%.  If unable to donate at blood donation center, we can perform therapeutic phlebotomy here in clinic.  3.  Tobacco use - Patient is current everyday smoker, 1 PPD since age 35, but had previously quit for about 12 years before starting again - Patient is agreeable to annual LDCT scan for as long as he qualifies for lung cancer screening - LDCT chest (08/25/2022): Lung RADS 2, benign appearance/behavior - PLAN: Patient is due for annual LDCT chest for LCS, but would like to hold off on that for now since he had to pay for the majority of the cost of his last LDCT chest.    4.  Social/family history: - Lives at home with his wife.  There is independent of ADLs and IADLs. 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 - Current active smoker.  Smokes 1 pack/day starting at age 39, quit for 12 years. - Brother had colon cancer.  Another brother had liver cancer and was a drinker.   PLAN SUMMARY: >> Referral entered to GI (NP Therisa Stager) for fatty liver disease associated with splenomegaly and thrombocytopenia >> Labs in 1 year = CBC/D, CMP, immature platelet fraction  >> OFFICE visit in 1 year (1 week after labs)     REVIEW OF SYSTEMS:  Review of Systems  Constitutional:  Negative for appetite change, chills, diaphoresis, fatigue, fever and unexpected weight change.  HENT:   Negative for lump/mass and nosebleeds.   Eyes:  Negative for  eye problems.  Respiratory:  Negative for cough, hemoptysis and shortness of breath.   Cardiovascular:  Negative for chest pain, leg swelling and palpitations.  Gastrointestinal:  Negative for abdominal pain, blood in stool, constipation, diarrhea, nausea and vomiting.  Genitourinary:  Negative for hematuria.   Musculoskeletal:  Positive for arthralgias.  Skin: Negative.   Neurological:  Negative for dizziness, headaches and light-headedness.  Hematological:  Does not bruise/bleed easily.  Psychiatric/Behavioral:  The patient is nervous/anxious.      PHYSICAL EXAM:  ECOG PERFORMANCE STATUS: 1 - Symptomatic but completely ambulatory  Vitals:   08/14/24 0838  BP: 131/86  Pulse: 65  Resp: 20  Temp: 97.7 F (36.5 C)  SpO2: 97%    Filed Weights   08/14/24 0838  Weight: 225 lb 8.5 oz (102.3 kg)    Physical Exam Constitutional:      Appearance: Normal appearance. He is obese.  Cardiovascular:     Heart sounds: Normal heart sounds.  Pulmonary:     Breath sounds: Wheezing present.  Neurological:     General: No focal deficit present.     Mental Status: Mental status is at baseline.  Psychiatric:        Behavior: Behavior normal. Behavior is cooperative.     PAST MEDICAL/SURGICAL HISTORY:  Past Medical History:  Diagnosis Date   Anxiety    Arthritis    all over my body I think (09/23/2017)   Chronic kidney disease    Colon polyp    Diverticulitis  GERD (gastroesophageal reflux disease)    hx   Hyperlipidemia    Hypertension    Large bowel obstruction (HCC) 01/31/2021   Pre-diabetes    Past Surgical History:  Procedure Laterality Date   ARTHROSCOPY KNEE W/ DRILLING Left 10/25/2020   COLONOSCOPY  X ~ 2   brother passed away w/colon cancer; I've probably had 4 colonoscopies done since he passed (09/23/2017)   COLONOSCOPY W/ BIOPSIES AND POLYPECTOMY  X 2   COLONOSCOPY WITH PROPOFOL  N/A 04/29/2021   Procedure: COLONOSCOPY WITH PROPOFOL ;  Surgeon: Eartha Angelia Sieving, MD;  Location: [facility];  Service: Gastroenterology;  Laterality: N/A;  8:45   COLOSTOMY N/A 01/31/2021   Procedure: COLOSTOMY;  Surgeon: Kallie Manuelita BROCKS, MD;  Location: [facility];  Service: General;  Laterality: N/A;   COLOSTOMY REVERSAL N/A 02/02/2022   Procedure: COLOSTOMY REVERSAL with small bowel resection;  Surgeon: Kallie Manuelita BROCKS, MD;  Location: [facility];  Service: General;  Laterality: N/A;   CYST EXCISION  2004   Roof of mouth   HEMORRHOID SURGERY     lanced   LEFT HEART CATH AND CORONARY ANGIOGRAPHY N/A 09/24/2017   Procedure: LEFT HEART CATH AND CORONARY ANGIOGRAPHY;  Surgeon: Dann Candyce RAMAN, MD;  Location: [facility];  Service: Cardiovascular;  Laterality: N/A;   PARASTOMAL HERNIA REPAIR N/A 02/02/2022   Procedure: HERNIA REPAIR PARASTOMAL AND VENTRAL WITH PHASIX MESH;  Surgeon: Kallie Manuelita BROCKS, MD;  Location: [facility];  Service: General;  Laterality: N/A;   PARTIAL COLECTOMY N/A 01/31/2021   Procedure: PARTIAL COLECTOMY;  Surgeon: Kallie Manuelita BROCKS, MD;  Location: [facility];  Service: General;  Laterality: N/A;   POLYPECTOMY  04/29/2021   Procedure: POLYPECTOMY;  Surgeon: Eartha Angelia, Sieving, MD;  Location: [facility];  Service: Gastroenterology;;   SHOULDER ARTHROSCOPY WITH ROTATOR CUFF REPAIR Right     SOCIAL HISTORY:  Social History   Socioeconomic History   Marital status: Married    Spouse name: Not on file   Number of children: Not on file   Years of education: Not on file   Highest education level: Not on file  Occupational History   Not on file  Tobacco Use   Smoking status: Every Day    Current packs/day: 1.00    Average packs/day: 1 pack/day for 34.0 years (34.0 ttl pk-yrs)    Types: Cigarettes   Smokeless tobacco: Never  Vaping Use   Vaping status: Never Used  Substance and Sexual Activity   Alcohol use: No   Drug use: No   Sexual activity: Yes  Other Topics Concern   Not on file  Social History Narrative    Not on file   Social Drivers of Health   Financial Resource Strain: Low Risk  (03/03/2022)   Overall Financial Resource Strain (CARDIA)    Difficulty of Paying Living Expenses: Not hard at all  Food Insecurity: No Food Insecurity (03/03/2022)   Hunger Vital Sign    Worried About Running Out of Food in the Last Year: Never true    Ran Out of Food in the Last Year: Never true  Transportation Needs: No Transportation Needs (03/03/2022)   PRAPARE - Administrator, Civil Service (Medical): No    Lack of Transportation (Non-Medical): No  Physical Activity: Not on file  Stress: Not on file  Social Connections: Not on file  Intimate Partner Violence: Not on file    FAMILY HISTORY:  Family History  Problem Relation Age of  Onset   Colon cancer Brother    Dementia Mother    CVA Father    Sudden death Sister 82       in her sleep, was told a heart attack   Heart disease Brother        CABG   Heart disease Brother        CABG   Heart disease Brother        CABG    CURRENT MEDICATIONS:  Outpatient Encounter Medications as of 08/14/2024  Medication Sig   ALPRAZolam  (XANAX ) 1 MG tablet Take 1 tablet (1 mg total) by mouth 3 (three) times daily as needed for anxiety.   aspirin  EC 81 MG tablet Take 81 mg by mouth daily. Swallow whole.   b complex vitamins capsule Take 1 capsule by mouth daily.   carvedilol  (COREG ) 6.25 MG tablet Take 1 tablet (6.25 mg total) by mouth in the morning and in the evening with a meal   lisinopril  (ZESTRIL ) 2.5 MG tablet Take 1 tablet (2.5 mg total) by mouth daily.   Multiple Vitamins-Minerals (MENS 50+ MULTI VITAMIN/MIN PO) Take 1 tablet by mouth daily.   Omega-3 Fatty Acids (FISH OIL) 1000 MG CAPS Take 1,000 mg by mouth daily. 4 daily  Ubiquinol   rosuvastatin  (CRESTOR ) 40 MG tablet Take 1 tablet (40 mg total) by mouth daily.   Semaglutide , 2 MG/DOSE, 8 MG/3ML SOPN Inject 2 mg as directed once a week.   tamsulosin  (FLOMAX ) 0.4 MG CAPS capsule  Take 1 capsule (0.4 mg total) by mouth 2 (two) times daily.   UNABLE TO FIND daily. Med Name: OTC Lidocaine  pain patch   diclofenac  Sodium (VOLTAREN ) 1 % GEL Apply 2-4 g topically 4 (four) times daily. (Patient not taking: Reported on 08/14/2024)   No facility-administered encounter medications on file as of 08/14/2024.    ALLERGIES:  Allergies  Allergen Reactions   Bee Venom Anaphylaxis, Swelling and Other (See Comments)    Anaphylactic shock   Penicillins Anaphylaxis and Swelling    Airway closes up Has patient had a PCN reaction causing immediate rash, facial/tongue/throat swelling, SOB or lightheadedness with hypotension: Yes Has patient had a PCN reaction causing severe rash involving mucus membranes or skin necrosis: Yes Has patient had a PCN reaction that required hospitalization: Prentiss Has patient had a PCN reaction occurring within the last 10 years: No If all of the above answers are NO, then may proceed with Cephalosporin use.     LABORATORY DATA:  I have reviewed the labs as listed.  CBC    Component Value Date/Time   WBC 7.1 08/14/2024 0753   RBC 6.21 (H) 08/14/2024 0753   HGB 19.0 (H) 08/14/2024 0753   HGB 19.0 (H) 06/28/2024 0830   HCT 56.4 (H) 08/14/2024 0753   HCT 56.4 (H) 06/28/2024 0830   PLT 112 (L) 08/14/2024 0753   PLT 147 (L) 06/28/2024 0830   MCV 90.8 08/14/2024 0753   MCV 90 06/28/2024 0830   MCH 30.6 08/14/2024 0753   MCHC 33.7 08/14/2024 0753   RDW 14.6 08/14/2024 0753   RDW 13.9 06/28/2024 0830   LYMPHSABS 1.7 08/14/2024 0753   LYMPHSABS 1.8 06/28/2024 0830   MONOABS 0.5 08/14/2024 0753   EOSABS 0.2 08/14/2024 0753   EOSABS 0.1 06/28/2024 0830   BASOSABS 0.1 08/14/2024 0753   BASOSABS 0.1 06/28/2024 0830      Latest Ref Rng & Units 06/28/2024    8:30 AM 03/24/2024    8:39 AM 12/23/2023  8:10 AM  CMP  Glucose 70 - 99 mg/dL 851  835  810   BUN 8 - 27 mg/dL 17  13  15    Creatinine 0.76 - 1.27 mg/dL 8.83  8.95  8.60   Sodium 134 - 144  mmol/L 138  141  140   Potassium 3.5 - 5.2 mmol/L 4.6  4.3  4.2   Chloride 96 - 106 mmol/L 102  103  102   CO2 20 - 29 mmol/L 21  17  23    Calcium  8.6 - 10.2 mg/dL 9.1  9.7  8.6   Total Protein 6.0 - 8.5 g/dL 6.8  6.7  6.4   Total Bilirubin 0.0 - 1.2 mg/dL 1.2  1.2  1.4   Alkaline Phos 44 - 121 IU/L 71  70  82   AST 0 - 40 IU/L 22  22  24    ALT 0 - 44 IU/L 21  24  24      DIAGNOSTIC IMAGING:  I have independently reviewed the relevant imaging and discussed with the patient.   WRAP UP:  All questions were answered. The patient knows to call the clinic with any problems, questions or concerns.  Medical decision making: Moderate  Time spent on visit: I spent 25 minutes counseling the patient face to face. The total time spent in the appointment was 40 minutes and more than 50% was on counseling.  Pleasant CHRISTELLA Barefoot, PA-C  08/14/24 9:42 AM

## 2024-08-14 ENCOUNTER — Inpatient Hospital Stay

## 2024-08-14 ENCOUNTER — Inpatient Hospital Stay: Attending: Hematology | Admitting: Physician Assistant

## 2024-08-14 VITALS — BP 131/86 | HR 65 | Temp 97.7°F | Resp 20 | Wt 225.5 lb

## 2024-08-14 DIAGNOSIS — Z823 Family history of stroke: Secondary | ICD-10-CM | POA: Diagnosis not present

## 2024-08-14 DIAGNOSIS — N189 Chronic kidney disease, unspecified: Secondary | ICD-10-CM | POA: Insufficient documentation

## 2024-08-14 DIAGNOSIS — I129 Hypertensive chronic kidney disease with stage 1 through stage 4 chronic kidney disease, or unspecified chronic kidney disease: Secondary | ICD-10-CM | POA: Insufficient documentation

## 2024-08-14 DIAGNOSIS — Z880 Allergy status to penicillin: Secondary | ICD-10-CM | POA: Insufficient documentation

## 2024-08-14 DIAGNOSIS — Z7985 Long-term (current) use of injectable non-insulin antidiabetic drugs: Secondary | ICD-10-CM | POA: Insufficient documentation

## 2024-08-14 DIAGNOSIS — D696 Thrombocytopenia, unspecified: Secondary | ICD-10-CM

## 2024-08-14 DIAGNOSIS — Z860100 Personal history of colon polyps, unspecified: Secondary | ICD-10-CM | POA: Insufficient documentation

## 2024-08-14 DIAGNOSIS — E1122 Type 2 diabetes mellitus with diabetic chronic kidney disease: Secondary | ICD-10-CM | POA: Diagnosis not present

## 2024-08-14 DIAGNOSIS — Z8249 Family history of ischemic heart disease and other diseases of the circulatory system: Secondary | ICD-10-CM | POA: Diagnosis not present

## 2024-08-14 DIAGNOSIS — E785 Hyperlipidemia, unspecified: Secondary | ICD-10-CM | POA: Diagnosis not present

## 2024-08-14 DIAGNOSIS — Z800 Family history of malignant neoplasm of digestive organs: Secondary | ICD-10-CM | POA: Diagnosis not present

## 2024-08-14 DIAGNOSIS — Z7982 Long term (current) use of aspirin: Secondary | ICD-10-CM | POA: Insufficient documentation

## 2024-08-14 DIAGNOSIS — Z791 Long term (current) use of non-steroidal anti-inflammatories (NSAID): Secondary | ICD-10-CM | POA: Insufficient documentation

## 2024-08-14 DIAGNOSIS — F17210 Nicotine dependence, cigarettes, uncomplicated: Secondary | ICD-10-CM | POA: Insufficient documentation

## 2024-08-14 DIAGNOSIS — Z79899 Other long term (current) drug therapy: Secondary | ICD-10-CM | POA: Diagnosis not present

## 2024-08-14 DIAGNOSIS — D751 Secondary polycythemia: Secondary | ICD-10-CM | POA: Diagnosis not present

## 2024-08-14 DIAGNOSIS — Z91030 Bee allergy status: Secondary | ICD-10-CM | POA: Diagnosis not present

## 2024-08-14 DIAGNOSIS — R161 Splenomegaly, not elsewhere classified: Secondary | ICD-10-CM

## 2024-08-14 DIAGNOSIS — F419 Anxiety disorder, unspecified: Secondary | ICD-10-CM | POA: Diagnosis not present

## 2024-08-14 DIAGNOSIS — K760 Fatty (change of) liver, not elsewhere classified: Secondary | ICD-10-CM | POA: Diagnosis not present

## 2024-08-14 LAB — CBC WITH DIFFERENTIAL/PLATELET
Abs Immature Granulocytes: 0.04 K/uL (ref 0.00–0.07)
Basophils Absolute: 0.1 K/uL (ref 0.0–0.1)
Basophils Relative: 1 %
Eosinophils Absolute: 0.2 K/uL (ref 0.0–0.5)
Eosinophils Relative: 2 %
HCT: 56.4 % — ABNORMAL HIGH (ref 39.0–52.0)
Hemoglobin: 19 g/dL — ABNORMAL HIGH (ref 13.0–17.0)
Immature Granulocytes: 1 %
Lymphocytes Relative: 24 %
Lymphs Abs: 1.7 K/uL (ref 0.7–4.0)
MCH: 30.6 pg (ref 26.0–34.0)
MCHC: 33.7 g/dL (ref 30.0–36.0)
MCV: 90.8 fL (ref 80.0–100.0)
Monocytes Absolute: 0.5 K/uL (ref 0.1–1.0)
Monocytes Relative: 7 %
Neutro Abs: 4.6 K/uL (ref 1.7–7.7)
Neutrophils Relative %: 65 %
Platelets: 112 K/uL — ABNORMAL LOW (ref 150–400)
RBC: 6.21 MIL/uL — ABNORMAL HIGH (ref 4.22–5.81)
RDW: 14.6 % (ref 11.5–15.5)
WBC: 7.1 K/uL (ref 4.0–10.5)
nRBC: 0 % (ref 0.0–0.2)

## 2024-08-14 NOTE — Patient Instructions (Signed)
 [facility] at [facility] **VISIT SUMMARY & IMPORTANT INSTRUCTIONS **   You were seen today by Pleasant Barefoot PA-C for your low platelets and your elevated hemoglobin (erythrocytosis).    LOW PLATELETS Your low platelets are most likely related to fatty liver disease. Fatty infiltration of the liver can lead to inflammation and scarring (fibrosis), which places you at increased risk of liver cirrhosis in the future. We will refer you to gastroenterology (NP Therisa Stager) for further evaluation of your fatty liver disease. We will continue to monitor your low platelets with repeat labs and office visit in 1 year.  ERYTHROCYTOSIS Your elevated hemoglobin is due to your tobacco use. The best treatment for elevated hemoglobin is to stop smoking. Until that time, I would like you to donate blood once every 2 months to try to keep your hematocrit <54%. Your elevated hemoglobin places you at increased risk of blood clots, heart attack, and stroke. You should continue taking aspirin  81 mg daily due to your high red blood cells in the setting of other cardiac risk factors such as hypertension, hyperlipidemia, obesity, and diabetes.  FOLLOW-UP APPOINTMENT: 1 year  ** Thank you for trusting me with your healthcare!  I strive to provide all of my patients with quality care at each visit.  If you receive a survey for this visit, I would be so grateful to you for taking the time to provide feedback.  Thank you in advance!  ~ Loi Rennaker                   Dr. Alean Stands   &   Pleasant Barefoot, PA-C   - - - - - - - - - - - - - - - - - -    Thank you for choosing [facility] at [facility] to provide your oncology and hematology care.  To afford each patient quality time with our provider, please arrive at least 15 minutes before your scheduled appointment time.   If you have a lab appointment with the Cancer Center please come in thru the Main  Entrance and check in at the main information desk.  You need to re-schedule your appointment should you arrive 10 or more minutes late.  We strive to give you quality time with our providers, and arriving late affects you and other patients whose appointments are after yours.  Also, if you no show three or more times for appointments you may be dismissed from the clinic at the providers discretion.     Again, thank you for choosing [facility].  Our hope is that these requests will decrease the amount of time that you wait before being seen by our physicians.       _____________________________________________________________  Should you have questions after your visit to [facility], please contact our office at [phone] and follow the prompts.  Our office hours are 8:00 a.m. and 4:30 p.m. Monday - Friday.  Please note that voicemails left after 4:00 p.m. may not be returned until the following business day.  We are closed weekends and major holidays.  You do have access to a nurse 24-7, just call the main number to the clinic [phone] and do not press any options, hold on the line and a nurse will answer the phone.    For prescription refill requests, have your pharmacy contact our office and allow 72 hours.

## 2024-08-21 ENCOUNTER — Other Ambulatory Visit (HOSPITAL_COMMUNITY): Payer: Self-pay

## 2024-08-22 ENCOUNTER — Encounter (INDEPENDENT_AMBULATORY_CARE_PROVIDER_SITE_OTHER): Payer: Self-pay | Admitting: *Deleted

## 2024-09-01 ENCOUNTER — Encounter (INDEPENDENT_AMBULATORY_CARE_PROVIDER_SITE_OTHER): Payer: Self-pay | Admitting: Gastroenterology

## 2024-09-01 ENCOUNTER — Ambulatory Visit (INDEPENDENT_AMBULATORY_CARE_PROVIDER_SITE_OTHER): Admitting: Gastroenterology

## 2024-09-01 ENCOUNTER — Other Ambulatory Visit (HOSPITAL_COMMUNITY): Payer: Self-pay

## 2024-09-01 ENCOUNTER — Telehealth (INDEPENDENT_AMBULATORY_CARE_PROVIDER_SITE_OTHER): Payer: Self-pay

## 2024-09-01 VITALS — BP 129/84 | HR 71 | Temp 97.1°F | Ht 70.0 in | Wt 227.1 lb

## 2024-09-01 DIAGNOSIS — K760 Fatty (change of) liver, not elsewhere classified: Secondary | ICD-10-CM | POA: Insufficient documentation

## 2024-09-01 DIAGNOSIS — Z860101 Personal history of adenomatous and serrated colon polyps: Secondary | ICD-10-CM

## 2024-09-01 DIAGNOSIS — R162 Hepatomegaly with splenomegaly, not elsewhere classified: Secondary | ICD-10-CM

## 2024-09-01 DIAGNOSIS — R748 Abnormal levels of other serum enzymes: Secondary | ICD-10-CM | POA: Diagnosis not present

## 2024-09-01 DIAGNOSIS — Z860100 Personal history of colon polyps, unspecified: Secondary | ICD-10-CM

## 2024-09-01 DIAGNOSIS — D696 Thrombocytopenia, unspecified: Secondary | ICD-10-CM

## 2024-09-01 DIAGNOSIS — R160 Hepatomegaly, not elsewhere classified: Secondary | ICD-10-CM | POA: Insufficient documentation

## 2024-09-01 NOTE — Progress Notes (Signed)
 Francisco Allen , M.D. Gastroenterology & Hepatology [facility] [address] [place], [place] [address] Primary Care Physician: Dettinger, Fonda LABOR, MD [address] [place] [address]  Chief Complaint: Hepatosplenomegaly  History of Present Illness: Francisco Allen is a 63 y.o. male with GERD, hyperlipidemia and  complicated diverticulitis /sigmoid stricture s/p partial colectomy (2022) with reversal 2023 , secondary polycythemia and thrombocytopenia who presents for  who presents for evaluation of hepatosplenomegaly  Patient was last seen by Dr. Eartha in 2022 for colonoscopy.  Patient is now referred for imaging finding of hepatosplenomegaly.  Patient used to drink alcohol previously but has quit.  Denies any new other medications on manage medications.  Denies any generalized yellowing of the skin or itching .The patient denies having any nausea, vomiting, fever, chills, hematochezia, melena, hematemesis, abdominal distention, abdominal pain, diarrhea, jaundice, pruritus or weight loss.  Labs from 10/ 2025 normal liver enzymes  triglycerides 262 hemoglobin 19 platelet 112 with platelets in 2000 2596 Hemoglobin A1c 6.4 Last Colonoscopy: 2022  - One 10 mm polyp in the ascending colon, removed with a cold snare. Resected and retrieved. - One 2 mm polyp in the transverse colon, removed with a cold biopsy forceps. Resected and retrieved. - Diverticulosis in the descending colon. - The colon between 20 cm proximal to the anus is normal. - One 4 mm polyp in the rectum, removed with a cold snare. Resected but not retrieved. - Non- bleeding internal hemorrhoids.  A. COLON, ASCENDING, POLYPECTOMY:  - Tubular adenoma (s).  - No high-grade dysplasia or carcinoma.   B. COLON, TRANSVERSE, POLYPECTOMY:  - Polypoid colonic mucosa.  - No adenomatous change or carcinoma.   Repeat 5 years suggested  03/03/2017, scope was advanced to the cecum, a  5 mm polyp was resected from the sigmoid colon (pathology consistent with hyperplastic polyp), he also had severe sigmoid stenosis and presence of multiple diverticulosis in the left colon, internal hemorrhoids were found.   FHx: neg for any gastrointestinal/liver disease, brother colon cancer at age 30, brother liver cancer Social: smokes 1 pack a day, used to drink alcohol but quit 15 years ago, no illicit drug use Surgical: partial colectomy with ostomy creation    Past Medical History: Past Medical History:  Diagnosis Date   Anxiety    Arthritis    all over my body I think (09/23/2017)   Chronic kidney disease    Colon polyp    Diverticulitis    GERD (gastroesophageal reflux disease)    hx   Hyperlipidemia    Hypertension    Large bowel obstruction (HCC) 01/31/2021   Pre-diabetes     Past Surgical History: Past Surgical History:  Procedure Laterality Date   ARTHROSCOPY KNEE W/ DRILLING Left 10/25/2020   COLONOSCOPY  X ~ 2   brother passed away w/colon cancer; I've probably had 4 colonoscopies done since he passed (09/23/2017)   COLONOSCOPY W/ BIOPSIES AND POLYPECTOMY  X 2   COLONOSCOPY WITH PROPOFOL  N/A 04/29/2021   Procedure: COLONOSCOPY WITH PROPOFOL ;  Surgeon: Eartha Angelia Sieving, MD;  Location: [facility];  Service: Gastroenterology;  Laterality: N/A;  8:45   COLOSTOMY N/A 01/31/2021   Procedure: COLOSTOMY;  Surgeon: Kallie Manuelita BROCKS, MD;  Location: [facility] ORS;  Service: General;  Laterality: N/A;   COLOSTOMY REVERSAL N/A 02/02/2022   Procedure: COLOSTOMY REVERSAL with small bowel resection;  Surgeon: Kallie Manuelita BROCKS, MD;  Location: [facility];  Service: General;  Laterality: N/A;  CYST EXCISION  2004   Roof of mouth   HEMORRHOID SURGERY     lanced   LEFT HEART CATH AND CORONARY ANGIOGRAPHY N/A 09/24/2017   Procedure: LEFT HEART CATH AND CORONARY ANGIOGRAPHY;  Surgeon: Dann Candyce RAMAN, MD;  [facility] INVASIVE CV LAB;  Service: Cardiovascular;   Laterality: N/A;   PARASTOMAL HERNIA REPAIR N/A 02/02/2022   Procedure: HERNIA REPAIR PARASTOMAL AND VENTRAL WITH PHASIX MESH;  Surgeon: Kallie Manuelita BROCKS, MD;  Location: [facility];  Service: General;  Laterality: N/A;   PARTIAL COLECTOMY N/A 01/31/2021   Procedure: PARTIAL COLECTOMY;  Surgeon: Kallie Manuelita BROCKS, MD;  Location: [facility];  Service: General;  Laterality: N/A;   POLYPECTOMY  04/29/2021   Procedure: POLYPECTOMY;  Surgeon: Eartha Flavors, Toribio, MD;  Location: [facility];  Service: Gastroenterology;;   SHOULDER ARTHROSCOPY WITH ROTATOR CUFF REPAIR Right     Family History: Family History  Problem Relation Age of Onset   Colon cancer Brother    Dementia Mother    CVA Father    Sudden death Sister 66       in her sleep, was told a heart attack   Heart disease Brother        CABG   Heart disease Brother        CABG   Heart disease Brother        CABG    Social History: Social History   Tobacco Use  Smoking Status Every Day   Current packs/day: 1.00   Average packs/day: 1 pack/day for 34.0 years (34.0 ttl pk-yrs)   Types: Cigarettes  Smokeless Tobacco Never   Social History   Substance and Sexual Activity  Alcohol Use No   Social History   Substance and Sexual Activity  Drug Use No    Allergies: Allergies  Allergen Reactions   Bee Venom Anaphylaxis, Swelling and Other (See Comments)    Anaphylactic shock   Penicillins Anaphylaxis and Swelling    Airway closes up Has patient had a PCN reaction causing immediate rash, facial/tongue/throat swelling, SOB or lightheadedness with hypotension: Yes Has patient had a PCN reaction causing severe rash involving mucus membranes or skin necrosis: Yes Has patient had a PCN reaction that required hospitalization: Prentiss Has patient had a PCN reaction occurring within the last 10 years: No If all of the above answers are NO, then may proceed with Cephalosporin use.     Medications: Current Outpatient Medications   Medication Sig Dispense Refill   ALPRAZolam  (XANAX ) 1 MG tablet Take 1 tablet (1 mg total) by mouth 3 (three) times daily as needed for anxiety. 90 tablet 2   aspirin  EC 81 MG tablet Take 81 mg by mouth daily. Swallow whole.     b complex vitamins capsule Take 1 capsule by mouth daily.     carvedilol  (COREG ) 6.25 MG tablet Take 1 tablet (6.25 mg total) by mouth in the morning and in the evening with a meal 180 tablet 3   lisinopril  (ZESTRIL ) 2.5 MG tablet Take 1 tablet (2.5 mg total) by mouth daily. 90 tablet 3   Multiple Vitamins-Minerals (MENS 50+ MULTI VITAMIN/MIN PO) Take 1 tablet by mouth daily.     Omega-3 Fatty Acids (FISH OIL) 1000 MG CAPS Take 1,000 mg by mouth daily. 4 daily  Ubiquinol     rosuvastatin  (CRESTOR ) 40 MG tablet Take 1 tablet (40 mg total) by mouth daily. 90 tablet 3   Semaglutide , 2 MG/DOSE, 8 MG/3ML SOPN Inject 2 mg as  directed once a week. 9 mL 3   tamsulosin  (FLOMAX ) 0.4 MG CAPS capsule Take 1 capsule (0.4 mg total) by mouth 2 (two) times daily. 60 capsule 11   UNABLE TO FIND daily. Med Name: OTC Lidocaine  pain patch     No current facility-administered medications for this visit.    Review of Systems: GENERAL: negative for malaise, night sweats HEENT: No changes in hearing or vision, no nose bleeds or other nasal problems. NECK: Negative for lumps, goiter, pain and significant neck swelling RESPIRATORY: Negative for cough, wheezing CARDIOVASCULAR: Negative for chest pain, leg swelling, palpitations, orthopnea GI: SEE HPI MUSCULOSKELETAL: Negative for joint pain or swelling, back pain, and muscle pain. SKIN: Negative for lesions, rash HEMATOLOGY Negative for prolonged bleeding, bruising easily, and swollen nodes. ENDOCRINE: Negative for cold or heat intolerance, polyuria, polydipsia and goiter. NEURO: negative for tremor, gait imbalance, syncope and seizures. The remainder of the review of systems is noncontributory.   Physical Exam: BP 129/84   Pulse  71   Temp (!) 97.1 F (36.2 C)   Ht 5' 10 (1.778 m)   Wt 227 lb 1.6 oz (103 kg)   BMI 32.59 kg/m  GENERAL: The patient is AO x3, in no acute distress. HEENT: Head is normocephalic and atraumatic. EOMI are intact. Mouth is well hydrated and without lesions. NECK: Supple. No masses LUNGS: Clear to auscultation. No presence of rhonchi/wheezing/rales. Adequate chest expansion HEART: RRR, normal s1 and s2. ABDOMEN: Soft, nontender, no guarding, no peritoneal signs, and nondistended. BS +. No masses.   Imaging/Labs: as above     Latest Ref Rng & Units 08/14/2024    7:53 AM 06/28/2024    8:30 AM 05/29/2024   10:06 AM  CBC  WBC 4.0 - 10.5 K/uL 7.1  7.0  8.9   Hemoglobin 13.0 - 17.0 g/dL 80.9  80.9  80.6   Hematocrit 39.0 - 52.0 % 56.4  56.4  57.4   Platelets 150 - 400 K/uL 112  147  137    No results found for: IRON, TIBC, FERRITIN  I personally reviewed and interpreted the available labs, imaging and endoscopic files.  Ultrasound 2025  IMPRESSION: 1. Diffuse increased echogenicity of the hepatic parenchyma is a nonspecific indicator of hepatocellular dysfunction, most commonly steatosis. 2. Splenomegaly.  Hepatomegaly documented since 2012  Impression and Plan:  Francisco Allen is a 63 y.o. male with GERD, hyperlipidemia and  complicated diverticulitis /sigmoid stricture s/p partial colectomy (2022) with reversal 2023 , secondary polycythemia and thrombocytopenia who presents for  who presents for evaluation of hepatosplenomegaly  #Hepatosplenomegaly  #Thrombocytopenia  This is likely due to MASLD with risk factors of BMI 32 prediabetes and hyper triglyceridemia    Fibrosis 4 Score = 2.79 (High risk)        Interpretation for patients with NAFLD          <1.30       -  F0-F1 (Low risk)          1.30-2.67 -  Indeterminate           >2.67      -  F3-F4 (High risk)      Validated for ages 79-65 . Patient platelets are less than 150 and splenomegaly elevated fib 4  score concerning for advanced liver disease.  Thrombocytopenia could be constellation of polycythemia vera   Although hepatomegaly has been present since 2012 ( ultrasound)   Will evaluate for CSPH (clinically significant portal hypertension)/degree of fibrosis  with  elastrography    will obtain baseline viral hepatitis profile, AMA, and autoimmune serologies and elastography to assess degree of fibrosis      - walking at a brisk pace/biking at moderate intesity 2.5-5 hours per week     - use pedometer/step counter to track activity     - goal to lose 5-10% of initial body weight     - avoid suagry drinks and juices, use zero calorie beverages     - increase water  intake     - eat a low carb diet with plenty of veggies and fruit     - Get sufficient sleep 7-8 hrs nightly     - maitain active lifestyle     - avoid alcohol     - recommend 2-3 cups Coffee daily     - Counsel on lowering cholesterol by having a diet rich in vegetables,          protein (avoid red meats) and good fats(fish, salmon).    Given family history of liver cancer in brother and now with hepatosplenomegaly, if noninvasive liver stiffness workup is elevated may pursue liver biopsy and medications such as Resmitrom in future for MASLD  Last colonoscopy 2022 suggest repeat 5 years.  Has history of advanced adenoma  All questions were answered.      Lizzie Cokley Faizan Amarilis Belflower, MD Gastroenterology and Hepatology [facility]   This chart has been completed using [facility] Dictation software, and while attempts have been made to ensure accuracy , certain words and phrases may not be transcribed as intended

## 2024-09-01 NOTE — Patient Instructions (Signed)
 It was very nice to meet you today, as dicussed with will plan for the following :  1) labs  2)      - walking at a brisk pace/biking at moderate intesity 2.5-5 hours per week     - use pedometer/step counter to track activity     - goal to lose 5-10% of initial body weight     - avoid suagry drinks and juices, use zero calorie beverages     - increase water  intake     - eat a low carb diet with plenty of veggies and fruit     - Get sufficient sleep 7-8 hrs nightly     - maitain active lifestyle     - avoid alcohol     - recommend 2-3 cups Coffee daily     - Counsel on lowering cholesterol by having a diet rich in vegetables,          protein (avoid red meats) and good fats(fish, salmon).

## 2024-09-01 NOTE — Telephone Encounter (Signed)
 Scheduled patients US  for 10/28 at 7:30am (NPO midnight). ATC patient to give appt, no answer and no vm option.

## 2024-09-04 DIAGNOSIS — Z860100 Personal history of colon polyps, unspecified: Secondary | ICD-10-CM | POA: Insufficient documentation

## 2024-09-04 NOTE — Telephone Encounter (Signed)
 Called and spoke with patient, he stated that he cannot afford an ultrasound at this time but will call us  after he gets the results of his bloodwork and after seeing his primary care doctor.

## 2024-09-05 ENCOUNTER — Ambulatory Visit (HOSPITAL_COMMUNITY)

## 2024-09-18 ENCOUNTER — Other Ambulatory Visit (HOSPITAL_COMMUNITY): Payer: Self-pay

## 2024-09-26 ENCOUNTER — Other Ambulatory Visit: Payer: Self-pay

## 2024-09-29 ENCOUNTER — Other Ambulatory Visit (HOSPITAL_COMMUNITY): Payer: Self-pay

## 2024-10-02 ENCOUNTER — Other Ambulatory Visit: Payer: Self-pay

## 2024-10-02 ENCOUNTER — Other Ambulatory Visit (HOSPITAL_COMMUNITY): Payer: Self-pay

## 2024-10-06 ENCOUNTER — Other Ambulatory Visit (HOSPITAL_COMMUNITY): Payer: Self-pay

## 2024-10-09 ENCOUNTER — Other Ambulatory Visit: Payer: Self-pay

## 2024-10-11 ENCOUNTER — Other Ambulatory Visit: Payer: Self-pay

## 2024-10-11 ENCOUNTER — Ambulatory Visit: Payer: Self-pay | Admitting: Family Medicine

## 2024-10-11 ENCOUNTER — Encounter: Payer: Self-pay | Admitting: Family Medicine

## 2024-10-11 ENCOUNTER — Other Ambulatory Visit (HOSPITAL_COMMUNITY): Payer: Self-pay

## 2024-10-11 VITALS — BP 122/76 | HR 68 | Temp 97.1°F | Ht 70.0 in | Wt 223.0 lb

## 2024-10-11 DIAGNOSIS — E1129 Type 2 diabetes mellitus with other diabetic kidney complication: Secondary | ICD-10-CM

## 2024-10-11 DIAGNOSIS — E1159 Type 2 diabetes mellitus with other circulatory complications: Secondary | ICD-10-CM | POA: Diagnosis not present

## 2024-10-11 DIAGNOSIS — Z23 Encounter for immunization: Secondary | ICD-10-CM

## 2024-10-11 DIAGNOSIS — E1169 Type 2 diabetes mellitus with other specified complication: Secondary | ICD-10-CM | POA: Diagnosis not present

## 2024-10-11 DIAGNOSIS — E785 Hyperlipidemia, unspecified: Secondary | ICD-10-CM | POA: Diagnosis not present

## 2024-10-11 DIAGNOSIS — I152 Hypertension secondary to endocrine disorders: Secondary | ICD-10-CM | POA: Diagnosis not present

## 2024-10-11 DIAGNOSIS — R809 Proteinuria, unspecified: Secondary | ICD-10-CM | POA: Diagnosis not present

## 2024-10-11 DIAGNOSIS — E1122 Type 2 diabetes mellitus with diabetic chronic kidney disease: Secondary | ICD-10-CM | POA: Diagnosis not present

## 2024-10-11 DIAGNOSIS — F411 Generalized anxiety disorder: Secondary | ICD-10-CM | POA: Diagnosis not present

## 2024-10-11 DIAGNOSIS — N183 Chronic kidney disease, stage 3 unspecified: Secondary | ICD-10-CM | POA: Diagnosis not present

## 2024-10-11 LAB — LIPID PANEL
Chol/HDL Ratio: 2.7 ratio (ref 0.0–5.0)
Cholesterol, Total: 56 mg/dL — ABNORMAL LOW (ref 100–199)
HDL: 21 mg/dL — ABNORMAL LOW (ref >39)
LDL Chol Calc (NIH): 12 mg/dL (ref 0–99)
Triglycerides: 126 mg/dL (ref 0–149)
VLDL Cholesterol Cal: 23 mg/dL (ref 5–40)

## 2024-10-11 LAB — CBC WITH DIFFERENTIAL/PLATELET
Basophils Absolute: 0.1 x10E3/uL (ref 0.0–0.2)
Basos: 1 %
EOS (ABSOLUTE): 0.1 x10E3/uL (ref 0.0–0.4)
Eos: 1 %
Hematocrit: 57 % — ABNORMAL HIGH (ref 37.5–51.0)
Hemoglobin: 18.6 g/dL — ABNORMAL HIGH (ref 13.0–17.7)
Immature Grans (Abs): 0 x10E3/uL (ref 0.0–0.1)
Immature Granulocytes: 0 %
Lymphocytes Absolute: 1.5 x10E3/uL (ref 0.7–3.1)
Lymphs: 19 %
MCH: 30 pg (ref 26.6–33.0)
MCHC: 32.6 g/dL (ref 31.5–35.7)
MCV: 92 fL (ref 79–97)
Monocytes Absolute: 0.9 x10E3/uL (ref 0.1–0.9)
Monocytes: 11 %
Neutrophils Absolute: 5.6 x10E3/uL (ref 1.4–7.0)
Neutrophils: 67 %
Platelets: 112 x10E3/uL — ABNORMAL LOW (ref 150–450)
RBC: 6.19 x10E6/uL — ABNORMAL HIGH (ref 4.14–5.80)
RDW: 13.6 % (ref 11.6–15.4)
WBC: 8.2 x10E3/uL (ref 3.4–10.8)

## 2024-10-11 LAB — CMP14+EGFR
ALT: 23 IU/L (ref 0–44)
AST: 22 IU/L (ref 0–40)
Albumin: 4.1 g/dL (ref 3.9–4.9)
Alkaline Phosphatase: 58 IU/L (ref 47–123)
BUN/Creatinine Ratio: 28 — ABNORMAL HIGH (ref 10–24)
BUN: 27 mg/dL (ref 8–27)
Bilirubin Total: 2.2 mg/dL — ABNORMAL HIGH (ref 0.0–1.2)
CO2: 24 mmol/L (ref 20–29)
Calcium: 8.7 mg/dL (ref 8.6–10.2)
Chloride: 104 mmol/L (ref 96–106)
Creatinine, Ser: 0.98 mg/dL (ref 0.76–1.27)
Globulin, Total: 2.2 g/dL (ref 1.5–4.5)
Glucose: 101 mg/dL — ABNORMAL HIGH (ref 70–99)
Potassium: 4.7 mmol/L (ref 3.5–5.2)
Sodium: 142 mmol/L (ref 134–144)
Total Protein: 6.3 g/dL (ref 6.0–8.5)
eGFR: 86 mL/min/1.73 (ref >59)

## 2024-10-11 LAB — BAYER DCA HB A1C WAIVED: HB A1C (BAYER DCA - WAIVED): 5.7 % — ABNORMAL HIGH (ref 4.8–5.6)

## 2024-10-11 MED ORDER — ALPRAZOLAM 1 MG PO TABS
1.0000 mg | ORAL_TABLET | Freq: Three times a day (TID) | ORAL | 2 refills | Status: DC | PRN
  Filled 2024-10-11: qty 90, 30d supply, fill #0

## 2024-10-11 MED ORDER — CARVEDILOL 6.25 MG PO TABS
6.2500 mg | ORAL_TABLET | Freq: Two times a day (BID) | ORAL | 3 refills | Status: AC
  Filled 2024-10-11 – 2024-11-16 (×2): qty 180, 90d supply, fill #0

## 2024-10-11 NOTE — Progress Notes (Signed)
 BP 122/76   Pulse 68   Temp (!) 97.1 F (36.2 C)   Ht 5' 10 (1.778 m)   Wt 223 lb (101.2 kg)   SpO2 96%   BMI 32.00 kg/m    Subjective:   Patient ID: Francisco Allen, male    DOB: [birth year], 63 y.o.   MRN: [id number]  HPI: Francisco Allen is a 63 y.o. male presenting on 10/11/2024 for Medical Management of Chronic Issues, Diabetes, Hyperlipidemia, and Hypertension   Discussed the use of AI scribe software for clinical note transcription with the patient, who gave verbal consent to proceed.  History of Present Illness   Francisco Allen is a 63 year old male with diabetes and fatty liver disease who presents for follow-up of his chronic conditions.  Glycemic control - Diabetes management is stable with consistent blood glucose levels.  Hepatic steatosis - Weight loss attributed to dietary changes for fatty liver disease. - Expresses frustration with repeated requests for liver and spleen ultrasounds, with most recent ultrasound performed one month ago.  Arthralgia - Chronic back pain related to arthritis, managed with medication. - No respiratory or cardiac symptoms.  Onychomycosis - Fungal infection of the big toe, currently treated with over-the-counter antifungal medication.        Anxiety recheck Patient's anxiety seems to be doing well, he currently takes alprazolam  1 mg 3 times daily as needed and feels it regularly.  [place]  PDMP database was reviewed and looks to be consistent with refills.  His pain agreement and urine drug screen was performed on 04/10/2024.  Relevant past medical, surgical, family and social history reviewed and updated as indicated. Interim medical history since our last visit reviewed. Allergies and medications reviewed and updated.  Review of Systems  Constitutional:  Negative for chills and fever.  Respiratory:  Negative for shortness of breath and wheezing.   Cardiovascular:  Negative for chest pain and leg swelling.   Musculoskeletal:  Positive for arthralgias. Negative for back pain and gait problem.  Skin:  Negative for rash.  Psychiatric/Behavioral:  Negative for dysphoric mood, self-injury, sleep disturbance and suicidal ideas. The patient is nervous/anxious.   All other systems reviewed and are negative.   Per HPI unless specifically indicated above   Allergies as of 10/11/2024       Reactions   Bee Venom Anaphylaxis, Swelling, Other (See Comments)   Anaphylactic shock   Penicillins Anaphylaxis, Swelling   Airway closes up Has patient had a PCN reaction causing immediate rash, facial/tongue/throat swelling, SOB or lightheadedness with hypotension: Yes Has patient had a PCN reaction causing severe rash involving mucus membranes or skin necrosis: Yes Has patient had a PCN reaction that required hospitalization: Prentiss Has patient had a PCN reaction occurring within the last 10 years: No If all of the above answers are NO, then may proceed with Cephalosporin use.        Medication List        Accurate as of October 11, 2024 11:26 AM. If you have any questions, ask your nurse or doctor.          ALPRAZolam  1 MG tablet Commonly known as: XANAX  Take 1 tablet (1 mg total) by mouth 3 (three) times daily as needed for anxiety.   aspirin  EC 81 MG tablet Take 81 mg by mouth daily. Swallow whole.   b complex vitamins capsule Take 1 capsule by mouth daily.   carvedilol  6.25 MG tablet Commonly known as: COREG  Take 1 tablet (  6.25 mg total) by mouth in the morning and in the evening with a meal   Fish Oil 1000 MG Caps Take 1,000 mg by mouth daily. 4 daily  Ubiquinol   lisinopril  2.5 MG tablet Commonly known as: ZESTRIL  Take 1 tablet (2.5 mg total) by mouth daily.   MENS 50+ MULTI VITAMIN/MIN PO Take 1 tablet by mouth daily.   Ozempic  (2 MG/DOSE) 8 MG/3ML Sopn Generic drug: Semaglutide  (2 MG/DOSE) Inject 2 mg as directed once a week.   rosuvastatin  40 MG tablet Commonly known  as: CRESTOR  Take 1 tablet (40 mg total) by mouth daily.   tamsulosin  0.4 MG Caps capsule Commonly known as: FLOMAX  Take 1 capsule (0.4 mg total) by mouth 2 (two) times daily.   UNABLE TO FIND daily. Med Name: OTC Lidocaine  pain patch         Objective:   BP 122/76   Pulse 68   Temp (!) 97.1 F (36.2 C)   Ht 5' 10 (1.778 m)   Wt 223 lb (101.2 kg)   SpO2 96%   BMI 32.00 kg/m   Wt Readings from Last 3 Encounters:  10/11/24 223 lb (101.2 kg)  09/01/24 227 lb 1.6 oz (103 kg)  08/14/24 225 lb 8.5 oz (102.3 kg)    Physical Exam Physical Exam   NECK: Thyroid  without nodules or enlargement. CHEST: Lungs clear to auscultation bilaterally. CARDIOVASCULAR: Heart regular rate and rhythm, no murmurs. EXTREMITIES: No cuts or sores on extremities.         Assessment & Plan:   Problem List Items Addressed This Visit       Cardiovascular and Mediastinum   Hypertension associated with diabetes (HCC)   Relevant Medications   carvedilol  (COREG ) 6.25 MG tablet     Endocrine   Hyperlipidemia associated with type 2 diabetes mellitus (HCC)   Relevant Medications   carvedilol  (COREG ) 6.25 MG tablet   Type 2 diabetes mellitus with microalbuminuria (HCC)   CKD stage 3 due to type 2 diabetes mellitus (HCC) - Primary   Relevant Orders   Bayer DCA Hb A1c Waived   CBC with Differential/Platelet   CMP14+EGFR   Lipid panel     Other   GAD (generalized anxiety disorder)   Relevant Medications   ALPRAZolam  (XANAX ) 1 MG tablet       Type 2 diabetes mellitus Blood glucose levels well-managed. Recent weight loss may aid fatty liver disease management. Ultrasound findings within normal limits. - Continue current diabetes management plan. - Follow up with GI specialist as needed.  Onychomycosis (toenail fungus) Fungal infection on big toe spreading to second toe. Topical treatment effective but requires six months. Oral antifungal poses liver risk. - Continue over-the-counter  antifungal treatment for six months. - Consider oral antifungal treatment if topical treatment is ineffective, with caution regarding liver function.  Generalized anxiety disorder Anxiety well-controlled with current medication. - Continue current anxiety medication regimen.  General Health Maintenance Blood pressure and overall health well-managed. Recent weight loss may benefit overall health. - Continue current health maintenance plan.     A1c was 5.7.     Follow up plan: Return in about 3 months (around 01/09/2025), or if symptoms worsen or fail to improve, for Diabetes and anxiety recheck.  Counseling provided for all of the vaccine components Orders Placed This Encounter  Procedures   Bayer DCA Hb A1c Waived   CBC with Differential/Platelet   CMP14+EGFR   Lipid panel    Fonda Levins, MD [facility] 10/11/2024, 11:26 AM

## 2024-10-12 ENCOUNTER — Other Ambulatory Visit (HOSPITAL_COMMUNITY): Payer: Self-pay

## 2024-10-12 ENCOUNTER — Other Ambulatory Visit: Payer: Self-pay

## 2024-10-12 ENCOUNTER — Telehealth: Payer: Self-pay | Admitting: Family Medicine

## 2024-10-12 DIAGNOSIS — F411 Generalized anxiety disorder: Secondary | ICD-10-CM

## 2024-10-12 MED ORDER — ALPRAZOLAM 1 MG PO TABS: 1.0000 mg | ORAL_TABLET | Freq: Three times a day (TID) | ORAL | 2 refills | Status: AC | PRN

## 2024-10-12 NOTE — Telephone Encounter (Signed)
 Scented to medicine pharmacy for him

## 2024-10-12 NOTE — Telephone Encounter (Signed)
 Patient aware

## 2024-10-12 NOTE — Telephone Encounter (Signed)
 Patients Xanax  Rx was sent to wrong pharmacy. Needs to be sent to [facility].

## 2024-10-16 ENCOUNTER — Other Ambulatory Visit (HOSPITAL_COMMUNITY): Payer: Self-pay

## 2024-10-16 ENCOUNTER — Ambulatory Visit: Payer: Self-pay | Admitting: Family Medicine

## 2024-10-18 ENCOUNTER — Other Ambulatory Visit (HOSPITAL_COMMUNITY): Payer: Self-pay

## 2024-10-19 ENCOUNTER — Other Ambulatory Visit (HOSPITAL_COMMUNITY): Payer: Self-pay

## 2024-10-30 ENCOUNTER — Other Ambulatory Visit (HOSPITAL_COMMUNITY): Payer: Self-pay

## 2024-11-06 ENCOUNTER — Other Ambulatory Visit: Payer: Self-pay

## 2024-11-15 ENCOUNTER — Other Ambulatory Visit: Payer: Self-pay

## 2024-11-15 ENCOUNTER — Telehealth: Payer: Self-pay | Admitting: Family Medicine

## 2024-11-15 NOTE — Telephone Encounter (Signed)
 Pt was seen by dentist today and was prescribed chlorhexidine  0.12% rinse, ibuprofen  600mg  tablet, and amoxicillin 875mg  he would like for Dr. Maryanne to know this.

## 2024-11-15 NOTE — Telephone Encounter (Signed)
 Okay sounds good, I hope that is all settles and heals well.

## 2024-11-16 ENCOUNTER — Other Ambulatory Visit: Payer: Self-pay

## 2024-11-30 ENCOUNTER — Other Ambulatory Visit: Payer: Self-pay | Admitting: Family Medicine

## 2024-11-30 DIAGNOSIS — E1129 Type 2 diabetes mellitus with other diabetic kidney complication: Secondary | ICD-10-CM

## 2024-11-30 MED ORDER — SEMAGLUTIDE (2 MG/DOSE) 8 MG/3ML ~~LOC~~ SOPN
2.0000 mg | PEN_INJECTOR | SUBCUTANEOUS | 3 refills | Status: AC
  Filled 2024-11-30: qty 9, 84d supply, fill #0

## 2024-11-30 NOTE — Progress Notes (Signed)
 Sent Ozempic  refill

## 2024-12-01 ENCOUNTER — Other Ambulatory Visit: Payer: Self-pay

## 2024-12-13 ENCOUNTER — Other Ambulatory Visit (HOSPITAL_COMMUNITY): Payer: Self-pay

## 2025-01-10 ENCOUNTER — Ambulatory Visit: Admitting: Family Medicine

## 2025-08-13 ENCOUNTER — Other Ambulatory Visit

## 2025-08-20 ENCOUNTER — Ambulatory Visit: Admitting: Physician Assistant
